# Patient Record
Sex: Female | Born: 1965 | Race: White | Hispanic: No | Marital: Married
Health system: Midwestern US, Academic
[De-identification: ages and names within clinical notes are randomized; demographics above are authoritative.]

---

## 2016-09-23 ENCOUNTER — Ambulatory Visit: Admit: 2016-09-23 | Discharge: 2016-09-23 | Payer: BC Managed Care – PPO

## 2016-09-23 ENCOUNTER — Encounter: Admit: 2016-09-23 | Discharge: 2016-09-23 | Payer: BC Managed Care – PPO

## 2016-09-23 DIAGNOSIS — J479 Bronchiectasis, uncomplicated: Principal | ICD-10-CM

## 2016-09-23 DIAGNOSIS — G43909 Migraine, unspecified, not intractable, without status migrainosus: ICD-10-CM

## 2016-09-23 DIAGNOSIS — I8290 Acute embolism and thrombosis of unspecified vein: ICD-10-CM

## 2016-09-23 DIAGNOSIS — G40909 Epilepsy, unspecified, not intractable, without status epilepticus: ICD-10-CM

## 2016-09-23 DIAGNOSIS — K219 Gastro-esophageal reflux disease without esophagitis: ICD-10-CM

## 2016-09-23 DIAGNOSIS — M797 Fibromyalgia: ICD-10-CM

## 2016-09-23 DIAGNOSIS — IMO0002 Ulcer: ICD-10-CM

## 2016-09-23 DIAGNOSIS — D4959 Neoplasm of unspecified behavior of other genitourinary organ: ICD-10-CM

## 2016-09-23 DIAGNOSIS — M359 Systemic involvement of connective tissue, unspecified: ICD-10-CM

## 2016-09-23 DIAGNOSIS — K7581 Nonalcoholic steatohepatitis (NASH): ICD-10-CM

## 2016-09-23 DIAGNOSIS — K5090 Crohn's disease, unspecified, without complications: Principal | ICD-10-CM

## 2016-09-23 DIAGNOSIS — J302 Other seasonal allergic rhinitis: ICD-10-CM

## 2016-09-23 DIAGNOSIS — K639 Disease of intestine, unspecified: ICD-10-CM

## 2016-09-23 DIAGNOSIS — I10 Essential (primary) hypertension: ICD-10-CM

## 2016-09-23 DIAGNOSIS — R05 Cough: Principal | ICD-10-CM

## 2016-09-23 DIAGNOSIS — D699 Hemorrhagic condition, unspecified: ICD-10-CM

## 2016-09-23 DIAGNOSIS — R42 Dizziness and giddiness: ICD-10-CM

## 2016-09-23 NOTE — Progress Notes
Subjective:       History of Present Illness  Tina Snyder is a 51 y.o. female.  She presents to my clinic today for follow-up of chronic bronchiectasis.  She states that she is in her usual state of health from pulmonary standpoint although her Crohn's disease has been more active in recent weeks.  She continues to take methotrexate weekly, and is also taking Entocort.  She occasionally has fevers and chills and says that she weakly has night sweats although she relates this to her Crohn's disease more than anything else.  She only has very mild cough which is very intermittent and usually in the morning.  Denies significant sputum production.  Denies weight loss, in fact she has had unintentional weight gain of approximately 20 pounds while on prednisone for various sinus complaints per her primary care physician over the past several months.  She is now off prednisone for the past 6 weeks and does feel that some of the weight gain and swelling is improving.  She denies chest pain or heart palpitations.       Review of Systems   Constitutional: Positive for fever.   Respiratory: Positive for cough and shortness of breath.    Neurological: Positive for dizziness.         Objective:         ??? blood sugar diagnostic (ONETOUCH VERIO) test strip Use 1 Strip as directed before meals and at bedtime.   ??? budesonide (ENTOCORT EC) 3 mg capsule Take 3 capsules by mouth every morning.   ??? cyclobenzaprine (FLEXERIL) 10 mg tablet Take 10 mg by mouth daily as needed. Marland Kitchen   ??? desvenlafaxine(+) (PRISTIQ) 50 mg tablet Take 50 mg by mouth every morning.     ??? diltiazem CD (CARDIZEM CD) 120 mg capsule Take 120 mg by mouth daily.   ??? diphenhydrAMINE (BENADRYL) 25 mg capsule Take 1 Cap by mouth every 4 hours as needed.   ??? folic acid (FOLVITE) 1 mg tablet Take 1 Tab by mouth daily.   ??? furosemide (LASIX PO) Take  by mouth.   ??? insulin aspart (niacinamide) (FIASP FLEXTOUCH U-100 INSULIN) 100 unit/mL (3 mL) inpn Inject 10 Units under the skin three times daily. + CF 1:20>140; take at first bite or up to 20 min after start of meal   ??? insulin aspart (NOVOLOG FLEXPEN) 100 unit/mL injection PEN Inject 0-7 Units under the skin three times daily with meals. Glucose 140-180 = 1 units   Glucose 181-220 = 2 units   Glucose 221-260 = 3 units  Glucose 261-300 = 4 units   Glucose 301-350 = 5 units   Glucose 351-400 = 6 units  Glucose >400 = 7 units   ??? INSULIN GLARGINE,HUM.REC.ANLOG (LANTUS SC) Inject  under the skin.   ??? insulin pen needles (disposable) (BD UF NANO PEN NEEDLES) 32 gauge x 5/32 pen needle Use 1 Each as directed as Needed. Use with insulin injections.   ??? lancets (ONE TOUCH DELICA) MISC Use 1 Each as directed before meals and at bedtime. Diag: Diabetes mellitus (E.11)   ??? meclizine (ANTIVERT) 25 mg tablet Take 1 tablet by mouth three times daily as needed.   ??? methotrexate 25 mg/mL injection 0.6 mL by SEE ADMIN INSTRUCTIONS route every 7 days. 25 mg IM weekly   ??? naproxen (NAPROSYN) 250 mg tablet Take 500 mg by mouth at bedtime as needed. Take with food.   ??? oxyCODONE (ROXICODONE, OXY-IR) 5 mg tablet Take 1 Tab  by mouth every 6 hours as needed for Pain   ??? pantoprazole DR (PROTONIX) 40 mg tablet Take 1 tablet by mouth twice daily.     Vitals:    09/23/16 1359   BP: (!) 156/99   Pulse: 114   Resp: 16   Temp: 36.9 ???C (98.5 ???F)   TempSrc: Oral   SpO2: 96%   Weight: 101.9 kg (224 lb 9.6 oz)   Height: 162.6 cm (64)     Body mass index is 38.55 kg/m???.     Physical Exam   Constitutional: She is oriented to person, place, and time. She appears well-developed and well-nourished.   HENT:   Head: Normocephalic and atraumatic.   Eyes: EOM are normal. Pupils are equal, round, and reactive to light.   Neck: Neck supple. No JVD present. No tracheal deviation present.   Cardiovascular: Normal rate and regular rhythm.    Pulmonary/Chest: Effort normal. No respiratory distress. She has no wheezes. She has no rales. She exhibits no tenderness.   Musculoskeletal: She exhibits no edema.   Neurological: She is alert and oriented to person, place, and time.   Skin: No rash noted.   Psychiatric: She has a normal mood and affect. Her behavior is normal.   Nursing note and vitals reviewed.           Assessment and Plan:  Impression:  1.  Cough.  This is most likely due to underlying chronic bronchiectasis and potentially to GE reflux to some degree.  Overall, I feel this is stable for the time being, and feel that her bronchiectasis is likely not acutely active.  2.  Bronchiectasis.  She has been taking Monday Wednesday Friday azithromycin, and is wondering about discontinuing this today.  It would appear that her bronchiectasis is under reasonable control at this time with minimal effort.  3.  GE reflux.  This is somewhat out of control currently with a recent change in her medications for Crohn's disease and and PPI.  4.  Crohn's disease, being followed by Dr. Janene Madeira.  Currently on methotrexate for systemic immunosuppression.  5.  Prior history of MAI infection of the lung.  This would appear to be quiescent for now.    Plan:  1.  Continue PPI per Dr. Janene Madeira.  2.  Discontinue azithromycin Monday Wednesday Friday as I feel that this is having limited effect for the time being and her bronchiectasis and MAI infection is quiescent.  3.  Continue Flonase 2 puffs each nostril daily.    I will plan to see her in follow-up in 6 months time with complete PFTs to be obtained at that office visit.  Overall I do feel that her lung disease is quiescent.

## 2016-09-24 ENCOUNTER — Encounter: Admit: 2016-09-24 | Discharge: 2016-09-24 | Payer: BC Managed Care – PPO

## 2016-09-24 ENCOUNTER — Ambulatory Visit: Admit: 2016-09-24 | Discharge: 2016-09-24 | Payer: BC Managed Care – PPO

## 2016-09-24 DIAGNOSIS — K639 Disease of intestine, unspecified: ICD-10-CM

## 2016-09-24 DIAGNOSIS — J302 Other seasonal allergic rhinitis: ICD-10-CM

## 2016-09-24 DIAGNOSIS — K219 Gastro-esophageal reflux disease without esophagitis: ICD-10-CM

## 2016-09-24 DIAGNOSIS — D4959 Neoplasm of unspecified behavior of other genitourinary organ: ICD-10-CM

## 2016-09-24 DIAGNOSIS — G43909 Migraine, unspecified, not intractable, without status migrainosus: ICD-10-CM

## 2016-09-24 DIAGNOSIS — M359 Systemic involvement of connective tissue, unspecified: ICD-10-CM

## 2016-09-24 DIAGNOSIS — K5090 Crohn's disease, unspecified, without complications: Principal | ICD-10-CM

## 2016-09-24 DIAGNOSIS — D699 Hemorrhagic condition, unspecified: ICD-10-CM

## 2016-09-24 DIAGNOSIS — K5080 Crohn's disease of both small and large intestine without complications: Principal | ICD-10-CM

## 2016-09-24 DIAGNOSIS — K7581 Nonalcoholic steatohepatitis (NASH): ICD-10-CM

## 2016-09-24 DIAGNOSIS — A310 Pulmonary mycobacterial infection: ICD-10-CM

## 2016-09-24 DIAGNOSIS — I10 Essential (primary) hypertension: ICD-10-CM

## 2016-09-24 DIAGNOSIS — G40909 Epilepsy, unspecified, not intractable, without status epilepticus: ICD-10-CM

## 2016-09-24 DIAGNOSIS — M797 Fibromyalgia: ICD-10-CM

## 2016-09-24 DIAGNOSIS — IMO0002 Ulcer: ICD-10-CM

## 2016-09-24 DIAGNOSIS — I8290 Acute embolism and thrombosis of unspecified vein: ICD-10-CM

## 2016-09-24 DIAGNOSIS — R42 Dizziness and giddiness: ICD-10-CM

## 2016-09-24 LAB — COMPREHENSIVE METABOLIC PANEL
Lab: 0.5 mg/dL — ABNORMAL HIGH (ref 0.3–1.2)
Lab: 1.2 mg/dL — ABNORMAL HIGH (ref 0.4–1.00)
Lab: 13 K/UL — ABNORMAL HIGH (ref 3–12)
Lab: 138 MMOL/L (ref 137–147)
Lab: 18 mg/dL (ref 7–25)
Lab: 247 mg/dL — ABNORMAL HIGH (ref 70–100)
Lab: 28 U/L (ref 7–40)
Lab: 29 MMOL/L (ref 21–30)
Lab: 3.1 MMOL/L — ABNORMAL LOW (ref 3.5–5.1)
Lab: 4.3 g/dL — ABNORMAL LOW (ref 3.5–5.0)
Lab: 45 mL/min — ABNORMAL LOW (ref >60)
Lab: 54 mL/min — ABNORMAL LOW (ref >60)
Lab: 66 U/L — ABNORMAL HIGH (ref 7–56)
Lab: 7.2 g/dL — ABNORMAL LOW (ref >60)
Lab: 83 U/L — ABNORMAL LOW (ref 25–110)
Lab: 9.8 mg/dL — ABNORMAL HIGH (ref >60)

## 2016-09-24 LAB — CBC AND DIFF
Lab: 0 10*3/uL (ref 0–0.20)
Lab: 11 10*3/uL — ABNORMAL HIGH (ref 4.5–11.0)
Lab: 4 M/UL (ref 4.0–5.0)

## 2016-09-24 LAB — SED RATE: Lab: 10 mm/h — ABNORMAL LOW (ref 0–30)

## 2016-09-24 LAB — C REACTIVE PROTEIN (CRP): Lab: 1.3 mg/dL — ABNORMAL HIGH (ref <1.0)

## 2016-09-24 MED ORDER — OMEPRAZOLE 40 MG PO CPDR
40 mg | ORAL_CAPSULE | Freq: Every day | ORAL | 11 refills | Status: AC
Start: 2016-09-24 — End: 2018-07-28

## 2016-09-24 MED ORDER — DICYCLOMINE 10 MG PO CAP
10 mg | ORAL_CAPSULE | Freq: Four times a day (QID) | ORAL | 5 refills | Status: AC | PRN
Start: 2016-09-24 — End: 2017-03-31

## 2016-09-24 NOTE — Progress Notes
Date of Service: 09/24/2016    Subjective:             Tina Snyder is a 51 y.o. female.    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  Since her last visit we started her on budesonide 9 mg per day.  This may have helped somewhat but only minimally.  She continues on methotrexate 25 mg per week.  She saw ENT about her dizziness and vertigo and the current opinion seems to be that this might be migraine related.  The patient was not able to have the MR enterography performed due to scheduling issues.  She feels like Protonix does not work as well as Nexium for reflux.  She was on prednisone a few times for sinus infections and did not feel like her Crohn's symptoms improved while on prednisone.  She has taken antibiotics recently and these actually worsen her symptoms.  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  She follows with infectious diseases and pulmonary for this.  She is not a candidate for biologic therapy due to this infection.  She was on Humira for a period of time before the Remicade but it did not help.  Her weight has increased with recent steroid use.  Last EGD and colonoscopy were in 2016 did not show active Crohn's disease.  She does have some crampy abdominal pain and loose stool.       Review of Systems   Constitutional: Positive for activity change, appetite change, fatigue and unexpected weight change.   HENT: Positive for congestion, facial swelling and sinus pressure.    Eyes: Positive for photophobia.   Respiratory: Positive for chest tightness and shortness of breath.    Cardiovascular: Positive for chest pain.   Gastrointestinal: Positive for abdominal pain, constipation, diarrhea, nausea and rectal pain.   Genitourinary: Positive for frequency.   Musculoskeletal: Positive for arthralgias, back pain, myalgias and neck pain. Skin: Positive for color change.   Neurological: Positive for dizziness, light-headedness and headaches.   Psychiatric/Behavioral: Positive for dysphoric mood and sleep disturbance. The patient is nervous/anxious.    All other systems reviewed and are negative.    Objective:         ??? blood sugar diagnostic (ONETOUCH VERIO) test strip Use 1 Strip as directed before meals and at bedtime.   ??? budesonide (ENTOCORT EC) 3 mg capsule Take 3 capsules by mouth every morning.   ??? cyclobenzaprine (FLEXERIL) 10 mg tablet Take 10 mg by mouth daily as needed. Marland Kitchen   ??? desvenlafaxine(+) (PRISTIQ) 50 mg tablet Take 50 mg by mouth every morning.     ??? diltiazem CD (CARDIZEM CD) 120 mg capsule Take 120 mg by mouth daily.   ??? diphenhydrAMINE (BENADRYL) 25 mg capsule Take 1 Cap by mouth every 4 hours as needed.   ??? folic acid (FOLVITE) 1 mg tablet Take 1 Tab by mouth daily.   ??? furosemide (LASIX PO) Take  by mouth.   ??? insulin aspart (niacinamide) (FIASP FLEXTOUCH U-100 INSULIN) 100 unit/mL (3 mL) inpn Inject 10 Units under the skin three times daily. + CF 1:20>140; take at first bite or up to 20 min after start of meal   ??? insulin aspart (NOVOLOG FLEXPEN) 100 unit/mL injection PEN Inject 0-7 Units under the skin three times daily with meals. Glucose 140-180 = 1 units  Glucose 181-220 = 2 units   Glucose 221-260 = 3 units  Glucose 261-300 = 4 units   Glucose 301-350 = 5 units   Glucose 351-400 = 6 units  Glucose >400 = 7 units   ??? INSULIN GLARGINE,HUM.REC.ANLOG (LANTUS SC) Inject  under the skin.   ??? insulin pen needles (disposable) (BD UF NANO PEN NEEDLES) 32 gauge x 5/32 pen needle Use 1 Each as directed as Needed. Use with insulin injections.   ??? lancets (ONE TOUCH DELICA) MISC Use 1 Each as directed before meals and at bedtime. Diag: Diabetes mellitus (E.11)   ??? meclizine (ANTIVERT) 25 mg tablet Take 1 tablet by mouth three times daily as needed.   ??? methotrexate 25 mg/mL injection 0.6 mL by SEE ADMIN INSTRUCTIONS route every 7 days. 25 mg IM weekly   ??? naproxen (NAPROSYN) 250 mg tablet Take 500 mg by mouth at bedtime as needed. Take with food.   ??? oxyCODONE (ROXICODONE, OXY-IR) 5 mg tablet Take 1 Tab by mouth every 6 hours as needed for Pain   ??? pantoprazole DR (PROTONIX) 40 mg tablet Take 1 tablet by mouth twice daily.     Vitals:    09/24/16 1237   BP: (!) 176/102   Pulse: 116   Temp: 36.2 ???C (97.2 ???F)   TempSrc: Oral   Weight: 100.3 kg (221 lb 3.2 oz)   Height: 162.6 cm (64)     Body mass index is 37.97 kg/m???.     Physical Exam  Vital signs and nurses notes reviewed  Patient alert and oriented, appropriate  Lungs clear to auscultation  Heart regular rhythm and rate  Abdomen soft, non-tender, normal bowel sounds  Neuro exam without focal deficits       Assessment and Plan:           1. Crohn's disease of small bowel and colon s/p resections  2. Chronic MAI infection  3. GERD    Tina Snyder seems to be symptomatic with some crampy abdominal pain and diarrhea.  Not sure whether this is related to active Crohn's or postoperative state or irritable bowel syndrome.  We will reschedule the MR enterography to assess for any bowel wall thickening or inflammation.  I would also like to recheck labs today.  She will continue budesonide and methotrexate for now.  I have added dicyclomine as needed to decrease bowel spasm.  I will try changing her Protonix to Prilosec 40 mg daily to see if this works better for heartburn and dyspepsia.  We will have Tina Snyder return to GI clinic for follow-up.    Tina Snyder was seen today for crohn's disease.    Diagnoses and all orders for this visit:    Crohn's disease of both small and large intestine without complication   -     dicyclomine (BENTYL) 10 mg capsule; Take 1 capsule by mouth four times daily as needed.  -     C REACTIVE PROTEIN (CRP); Future; Expected date: 09/24/2016  -     CBC AND DIFF; Future; Expected date: 09/24/2016 -     COMPREHENSIVE METABOLIC PANEL; Future; Expected date: 09/24/2016  -     SED RATE; Future; Expected date: 09/24/2016  -     MRI ENTEROGRAPHY; Future; Expected date: 09/24/2016    Gastroesophageal reflux disease without esophagitis  -     omeprazole DR(+) (PRILOSEC) 40 mg capsule; Take 1 capsule by mouth daily before breakfast.    Patient Instructions   1.  Stop Protonix  2. Try Prilosec 40 mg po daily  3. Reschedule MR enterography  4. Labs today  5. Continue methotrexate and budesonide for now  6. Dicyclomine 10 mg four times per day as needed for Crohn's disease

## 2016-09-25 ENCOUNTER — Encounter: Admit: 2016-09-25 | Discharge: 2016-09-25 | Payer: BC Managed Care – PPO

## 2016-09-25 NOTE — Telephone Encounter
Attempt to contact patient regarding results along with Dr. York Cerise recommendations.  Left message for patient to check MyChart portal regarding results letter to patient.  Patient to call us back for any questions.

## 2016-09-25 NOTE — Telephone Encounter
-----   Message from Viviann Spare, MD sent at 09/24/2016  4:36 PM CDT -----  Labs suggest some inflammation  Glucose and wbc could be up due to recent steroid use  Proceed with MR enterography as ordered

## 2016-09-25 NOTE — Telephone Encounter
Called notified pt of coverage for omeprazole. Pt verbalized understanding.

## 2016-10-21 ENCOUNTER — Encounter: Admit: 2016-10-21 | Discharge: 2016-10-21 | Payer: BC Managed Care – PPO

## 2016-10-21 DIAGNOSIS — K50018 Crohn's disease of small intestine with other complication: Principal | ICD-10-CM

## 2016-10-21 MED ORDER — BUDESONIDE 3 MG PO CECX
9 mg | ORAL_CAPSULE | Freq: Every morning | ORAL | 1 refills | 30.00000 days | Status: AC
Start: 2016-10-21 — End: 2017-03-27

## 2016-10-25 ENCOUNTER — Ambulatory Visit: Admit: 2016-10-25 | Discharge: 2016-10-25 | Payer: BC Managed Care – PPO

## 2016-10-25 DIAGNOSIS — K5080 Crohn's disease of both small and large intestine without complications: Principal | ICD-10-CM

## 2016-10-25 LAB — POC CREATININE, RAD: Lab: 1 mg/dL (ref 0.4–1.00)

## 2016-10-25 LAB — POC GLUCOSE: Lab: 110 mg/dL — ABNORMAL HIGH (ref 70–100)

## 2016-10-25 MED ORDER — GADOBENATE DIMEGLUMINE 529 MG/ML (0.1MMOL/0.2ML) IV SOLN
20 mL | Freq: Once | INTRAVENOUS | 0 refills | Status: CP
Start: 2016-10-25 — End: ?
  Administered 2016-10-25: 21:00:00 20 mL via INTRAVENOUS

## 2016-10-25 MED ORDER — GLUCAGON HCL 1 MG IJ SOLR
1 [IU] | Freq: Once | SUBCUTANEOUS | 0 refills | Status: CP
Start: 2016-10-25 — End: ?
  Administered 2016-10-25: 21:00:00 1 mg via SUBCUTANEOUS

## 2016-10-25 MED ORDER — SODIUM CHLORIDE 0.9 % IJ SOLN
50 mL | Freq: Once | INTRAVENOUS | 0 refills | Status: CP
Start: 2016-10-25 — End: ?
  Administered 2016-10-25: 21:00:00 50 mL via INTRAVENOUS

## 2016-10-25 MED ORDER — BREEZA FOR NEUTRAL ABDOMINAL/PELVIC IMAGING PO SOLN
1500 mL | Freq: Once | ORAL | 0 refills | Status: CP
Start: 2016-10-25 — End: ?

## 2016-10-25 NOTE — Progress Notes
Pt present for MR Enterography. Tech instructed pt on procedure and oral contrast administration, Breeza. RN spoke pt prior to glucagon injection. Denies contraindications- steroid use, pheochromocytoma. Pt diabetic. Orders placed per protocol. Documented on MAR.     1430:  BS 110

## 2017-02-21 ENCOUNTER — Ambulatory Visit: Admit: 2017-02-21 | Discharge: 2017-02-21 | Payer: BC Managed Care – PPO

## 2017-02-21 ENCOUNTER — Encounter: Admit: 2017-02-21 | Discharge: 2017-02-21 | Payer: BC Managed Care – PPO

## 2017-02-21 DIAGNOSIS — M359 Systemic involvement of connective tissue, unspecified: ICD-10-CM

## 2017-02-21 DIAGNOSIS — D4959 Neoplasm of unspecified behavior of other genitourinary organ: ICD-10-CM

## 2017-02-21 DIAGNOSIS — IMO0002 Ulcer: ICD-10-CM

## 2017-02-21 DIAGNOSIS — G40909 Epilepsy, unspecified, not intractable, without status epilepticus: ICD-10-CM

## 2017-02-21 DIAGNOSIS — K219 Gastro-esophageal reflux disease without esophagitis: ICD-10-CM

## 2017-02-21 DIAGNOSIS — E1165 Type 2 diabetes mellitus with hyperglycemia: Principal | ICD-10-CM

## 2017-02-21 DIAGNOSIS — E119 Type 2 diabetes mellitus without complications: Principal | ICD-10-CM

## 2017-02-21 DIAGNOSIS — M797 Fibromyalgia: ICD-10-CM

## 2017-02-21 DIAGNOSIS — I10 Essential (primary) hypertension: ICD-10-CM

## 2017-02-21 DIAGNOSIS — G43909 Migraine, unspecified, not intractable, without status migrainosus: ICD-10-CM

## 2017-02-21 DIAGNOSIS — I8290 Acute embolism and thrombosis of unspecified vein: ICD-10-CM

## 2017-02-21 DIAGNOSIS — R42 Dizziness and giddiness: ICD-10-CM

## 2017-02-21 DIAGNOSIS — D699 Hemorrhagic condition, unspecified: ICD-10-CM

## 2017-02-21 DIAGNOSIS — K639 Disease of intestine, unspecified: ICD-10-CM

## 2017-02-21 DIAGNOSIS — K5090 Crohn's disease, unspecified, without complications: Principal | ICD-10-CM

## 2017-02-21 DIAGNOSIS — J302 Other seasonal allergic rhinitis: ICD-10-CM

## 2017-02-21 DIAGNOSIS — K7581 Nonalcoholic steatohepatitis (NASH): ICD-10-CM

## 2017-02-21 MED ORDER — PEN NEEDLE, DIABETIC 32 GAUGE X 5/32" MISC NDLE
1 | Freq: Before meals | 11 refills | 30.00000 days | Status: AC
Start: 2017-02-21 — End: 2018-05-08

## 2017-02-21 MED ORDER — INSULIN ASPART 100 UNIT/ML SC FLEXPEN
11 refills | 42.00000 days | Status: AC
Start: 2017-02-21 — End: 2018-03-18

## 2017-02-21 MED ORDER — FLASH GLUCOSE SENSOR MISC KIT
1 | PACK | 11 refills | Status: SS
Start: 2017-02-21 — End: 2017-06-29

## 2017-02-21 MED ORDER — FLASH GLUCOSE SCANNING READER MISC MISC
1 | Freq: Every day | 1 refills | Status: SS
Start: 2017-02-21 — End: 2017-06-29

## 2017-02-21 MED ORDER — INSULIN GLARGINE 100 UNIT/ML (3 ML) SC INJ PEN
11 refills | Status: SS
Start: 2017-02-21 — End: 2017-06-29

## 2017-02-21 NOTE — Progress Notes
Subjective     02/21/2017     Tina Snyder 52 y.o. female here to follow up for T2DM at [facility]   07/19/2016 .last seen with Arvilla Market, APRN, CDE     Type 2 Diabetes mellitus  Dx: 2012 prednisone-induced Diabetes first, then T2DM Sum 2017 began insulin  Current treatment: Multiple Daily Injections (MDI) (see chart): Lantus,79u/d Humalog/Novolog/Fiasp with meals plus Correction Factor CF 2:40 >140   Past treatment: glyburide 2012 when on prednisone   Number of glucose checks per day: 4  Hyperglycemia: yes   Hypoglycemia: mild, rare   Meals per day: 2-3   CHO intake: <60gms/meal goal   Weight: 218#   Exercise:  as tolerated   Shortness of breath or Chest pain with exertion: yes    DM related hospitalizations: no     Complications of DM:  CAD: No  CVA: No  PVD: No  Amputations: No  Retinopathy: No  Gastropathy: No  Nephropathy: CKD eGFR 47, stage 3  Neuropathy: No  Depression: No  Chronic wounds / delayed healing: no     DM related medications:  Statin: no  ACE-I: Yes  ASA: no    DM-related routine health maintenance:   ??? Last dental exam in last 6 mo: UTD   ??? Last eye exam in last 12 mo: UTD   ??? Last lipid panel: 2017  ??? Last urine microalbumin: Prot/Cr 7.2017 0.2   ??? Last foot exam: 2017   ??? Last meeting with diabetic educator: none   ??? Last meeting with nutritionist:     Past Medical History; Past Surgical History; Family History; Social History and problem list reviewed; changes updated per patient report, paperwork completed     Past Medical History:   Diagnosis Date   ??? Autoimmune disease (HCC)     Chron's   ??? Bleeding tendency (HCC)    ??? Blood clot in vein     subclavian   ??? Bowel disease    ??? Crohn disease (HCC)    ??? Dizziness    ??? Fibromyalgia    ??? GERD (gastroesophageal reflux disease)    ??? Hypertension    ??? Hypertensive heart disease    ??? Migraines    ??? Seasonal allergic reaction    ??? Seizure disorder (HCC)    ??? Steatohepatitis     felt secondary to medications   ??? Ulcer ??? Vaginal tumor        Past Surgical History:   Procedure Laterality Date   ??? HX ADENOIDECTOMY  05/1995   ??? LUNG SURGERY  10/22/2010    lung biopsy   ??? LIVER BIOPSY  12/2010   ??? PR COLONOSCOPY FLX DX W/COLLJ SPEC WHEN PFRMD N/A 03/13/2015    COLONOSCOPY performed by Elyn Aquas, MD at [facility]   ??? UPPER GASTROINTESTINAL ENDOSCOPY N/A 03/13/2015    ESOPHAGOGASTRODUODENOSCOPY performed by Elyn Aquas, MD at [facility]   ??? UPPER GASTROINTESTINAL ENDOSCOPY  03/13/2015    ESOPHAGOGASTRODUODENOSCOPY BIOPSY performed by Elyn Aquas, MD at [facility]   ??? COLONOSCOPY  03/13/2015    COLONOSCOPY BIOPSY performed by Elyn Aquas, MD at [facility]   ??? APPENDECTOMY     ??? BRONCHOSCOPY     ??? CHOLECYSTECTOMY     ??? GASTRIC FUNDOPLICATION     ??? HEMORRHOIDECTOMY     ??? HX BREAST LUMPECTOMY     ??? HX ENDOSCOPY      colonoscopy 2014   ??? HX HYSTERECTOMY      partial  02/1995, ovaries removed 1999   ??? OOPHORECTOMY      1999 ovaries removed   ??? SMALL BOWEL RESECTION     ??? SUBTOTAL COLECTOMY      Ileocolectomy        Family History   Problem Relation Age of Onset   ??? Celiac Disease Son    ??? Asthma Son    ??? Hypertension Mother    ??? Depression Mother    ??? Basal Cell Carcinoma Mother    ??? Hypertension Father    ??? High Cholesterol Father    ??? Depression Father    ??? Hypertension Maternal Grandmother    ??? Stroke Maternal Grandmother    ??? Heart Failure Maternal Grandfather    ??? Stroke Maternal Grandfather    ??? Migraines Maternal Grandfather    ??? Cancer Paternal Grandmother         breast   ??? Hypertension Paternal Grandmother    ??? High Cholesterol Paternal Grandmother    ??? Depression Paternal Grandmother    ??? Heart Failure Paternal Grandfather    ??? Hypertension Paternal Grandfather    ??? Blood Clots Neg Hx    ??? COPD Neg Hx    ??? Coronary Artery Disease Neg Hx    ??? Cystic Fibrosis Neg Hx    ??? DVT Neg Hx    ??? Pulmonary Embolism Neg Hx    ??? Pulmonary Fibrosis Neg Hx    ??? Pulmonary HTN Neg Hx    ??? Melanoma Neg Hx        Social History     Social History ??? Marital status: Married     Spouse name: Matt   ??? Number of children: 2   ??? Years of education: N/A     Occupational History   ??? 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 Smoking status: Never Smoker   ??? Smokeless tobacco: Never Used   ??? Alcohol use No      Comment: rare   ??? Drug use: No   ??? Sexual activity: Not on file     Other Topics Concern   ??? Not on file     Social History Narrative   ??? No narrative on file       Patient Active Problem List    Diagnosis Date Noted   ??? Enteritis 03/27/2016   ??? Parotitis 10/18/2015   ??? CKD (chronic kidney disease) stage 3, GFR 30-59 ml/min (HCC) 10/18/2015   ??? Cough 02/22/2014   ??? LFT elevation 11/09/2012   ??? 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 Surgical wound infection 09/30/2012   ??? 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 DVT (deep venous thrombosis) (HCC) 10/31/2010   ??? Anemia 10/30/2010   ??? Supratherapeutic INR 10/30/2010   ??? Seizure disorder (HCC) 10/30/2010   ??? Hypoxia 10/16/2010   ??? Abnormal LFTs 10/16/2010   ??? HTN (hypertension) 10/16/2010   ??? Depression 10/16/2010   ??? Crohn's disease (HCC) 09/13/2007       Review of Systems -  Please refer to HPI and/or problem list for positives, otherwise neg on a 10+ ROS       Objective    ??? blood sugar diagnostic (ONETOUCH VERIO) test strip Use 1 Strip as directed before meals and at bedtime.   ??? budesonide (ENTOCORT EC) 3 mg capsule TAKE 3 CAPSULES BY MOUTH EVERY MORNING.   ??? cyclobenzaprine (  FLEXERIL) 10 mg tablet Take 10 mg by mouth daily as needed. Marland Kitchen   ??? desvenlafaxine(+) (PRISTIQ) 50 mg tablet Take 50 mg by mouth every morning.     ??? dicyclomine (BENTYL) 10 mg capsule Take 1 capsule by mouth four times daily as needed.   ??? diltiazem CD (CARDIZEM CD) 120 mg capsule Take 120 mg by mouth daily.   ??? diphenhydrAMINE (BENADRYL) 25 mg capsule Take 1 Cap by mouth every 4 hours as needed. ??? flash glucose scanning reader misc Use 1 each as directed ten times daily. May check continuously ad lib   ??? flash glucose sensor kit Use 1 each as directed as directed. Change every 10 days   ??? folic acid (FOLVITE) 1 mg tablet Take 1 Tab by mouth daily.   ??? furosemide (LASIX PO) Take  by mouth.   ??? insulin aspart (niacinamide) (FIASP FLEXTOUCH U-100 INSULIN) 100 unit/mL (3 mL) inpn Inject 10 Units under the skin three times daily. + CF 1:20>140; take at first bite or up to 20 min after start of meal   ??? insulin aspart U-100 (NOVOLOG FLEXPEN U-100 INSULIN) 100 unit/mL injection PEN 15/15/20 (or Carb ratio 1u:3gm) at B/L/D + CF 1:10>140, Max: 200u/d   ??? insulin glargine (LANTUS SOLOSTAR, BASAGLAR) 100 unit/mL (3 mL) injection PEN adjusting to reach am fasting target 80-130. May use iSage app   ??? insulin pen needles (disposable) (BD UF NANO PEN NEEDLES) 32 gauge x 5/32 pen needle Use one each as directed before meals and at bedtime. Use with insulin injections. 5/d. Remove pen needle after each injection.   ??? lancets (ONE TOUCH DELICA) MISC Use 1 Each as directed before meals and at bedtime. Diag: Diabetes mellitus (E.11)   ??? meclizine (ANTIVERT) 25 mg tablet Take 1 tablet by mouth three times daily as needed.   ??? methotrexate 25 mg/mL injection 0.6 mL by SEE ADMIN INSTRUCTIONS route every 7 days. 25 mg IM weekly   ??? naproxen (NAPROSYN) 250 mg tablet Take 500 mg by mouth at bedtime as needed. Take with food.   ??? omeprazole DR(+) (PRILOSEC) 40 mg capsule Take 1 capsule by mouth daily before breakfast.   ??? oxyCODONE (ROXICODONE, OXY-IR) 5 mg tablet Take 1 Tab by mouth every 6 hours as needed for Pain     New scripts for Lantus, Novolog, Freestyle Libre personal glucometer or Flash    Vitals:    02/21/17 1549   Weight: 99.8 kg (220 lb)   Height: 162.6 cm (64)       Physical Exam    Lab Results   Component Value Date    TRIG 148 09/03/2012       TSH   Date/Time Value Ref Range Status 03/26/2016 04:38 PM 2.932 0.35 - 5.00 MCU/ML Final     T4-Free   Date/Time Value Ref Range Status   09/25/2007 12:30 PM 0.7 0.6 - 1.6 NG/DL Final     Comment:     NOTE NEW REFERENCE RANGES       Lab Results   Component Value Date/Time    NA 138 09/24/2016 01:58 PM    K 3.1 (L) 09/24/2016 01:58 PM    CL 96 (L) 09/24/2016 01:58 PM    CO2 29 09/24/2016 01:58 PM    GAP 13 (H) 09/24/2016 01:58 PM    BUN 18 09/24/2016 01:58 PM    CR 1.0 10/25/2016 02:30 PM    CR 1.26 (H) 09/24/2016 01:58 PM    GLU 247 (H)  09/24/2016 01:58 PM       Lab Results   Component Value Date/Time    CA 9.8 09/24/2016 01:58 PM    PO4 5.2 (H) 09/06/2012 04:17 AM    ALBUMIN 4.3 09/24/2016 01:58 PM    TOTPROT 7.2 09/24/2016 01:58 PM    ALKPHOS 83 09/24/2016 01:58 PM    AST 28 09/24/2016 01:58 PM    ALT 66 (H) 09/24/2016 01:58 PM    TOTBILI 0.5 09/24/2016 01:58 PM    GFR 45 (L) 09/24/2016 01:58 PM    GFRAA 54 (L) 09/24/2016 01:58 PM       Reviewed with French Ana who is checking at 3.5 different times of the day, with a total of   110 tests over the last 30 days. The mean of all blood glucose values during that time was 195 +/-82. The lowest value was 60, the highest value was 401. Based on the time of day, Breakfast mean was 105-133; Lunch:114-145; Dinner: 239-261; HS 201-267.  A1c avg  6.8%-168     Random BG: 237  Hgb A1c: 6.8%    Assessment and Plan:    Diabetes mellitus type 2, controlled   ??? A1c 6.8% in clinic today   ??? Target A1c <7%   ??? Currently on Lantus, Novolog 10u/meal + CF see below    ??? Complicated by: Nephropathy, Retinopathy, Peripheral neuropathy, Autonomic neuropathy, Gastropathy/Delayed gastric emptying, CAD, CVA, PVD, amputations    Plan:  ??? Continue current regimen see below     Diabetic complication assessment:   ??? Annual Dilated eye exam: last done 2018, due yearly   ??? Annual labs (electrolytes and renal function): Reviewed, last done 9.2018  ??? Annual Urine microalbumin/Cr: last done unavailable, result f   ??? ACE-I or ARB: no ??? Foot exam / monofilament exam (recommended annually): 2018    Supportive care for diabetes:  ??? Diabetic Educator (recommended annually): Dustin Flock, BSN,RN,CDE 11.2.18  ??? Nutritionist: Dustin Flock, BSN,RN,CDE 11.2.18    Lipids: Hyperlipidemia    ??? Had lipid profile in 5.15.14 TG 148  ??? Currently on a statin: no    Blood pressure: Hypertension   ??? Controlled <130/80-goal   ??? On     Tina Snyder was seen today for diabetes.    Diagnoses and all orders for this visit:    Type 2 diabetes mellitus with hyperglycemia, with long-term current use of insulin (HCC)  -     POC GLUCOSE QUANTITATIVE BLOOD  -     POC HEMOGLOBIN A1C  -     GLUCOSE METER DOWNLOAD    Other orders  -     flash glucose scanning reader misc; Use 1 each as directed ten times daily. May check continuously ad lib  -     flash glucose sensor kit; Use 1 each as directed as directed. Change every 10 days  -     insulin glargine (LANTUS SOLOSTAR, BASAGLAR) 100 unit/mL (3 mL) injection PEN; adjusting to reach am fasting target 80-130. May use iSage app  -     insulin aspart U-100 (NOVOLOG FLEXPEN U-100 INSULIN) 100 unit/mL injection PEN; 15/15/20 (or Carb ratio 1u:3gm) at B/L/D + CF 1:10>140, Max: 200u/d  -     insulin pen needles (disposable) (BD UF NANO PEN NEEDLES) 32 gauge x 5/32 pen needle; Use one each as directed before meals and at bedtime. Use with insulin injections. 5/d. Remove pen needle after each injection.      Patient Instructions  Diabetes Action Plan: for Tina Snyder  Your Diabetic Control:  Today's HgbA1c  6.8% ,  Last visit value  6.8%.  Your A1c goal is <7% reflecting average blood sugars of 150 mg/dl or lower.    Your premeal and bedtime blood sugar targets should be 90-130.    Actions taken today:  Med/Insulin adjustment; review of diabetes treatment plan: lifestyle, meds, nutrition.   Pills and new medications:  No orders of the defined types were placed in this encounter. Daily Insulin Doses  Breakfast Lunch Dinner/evening  evening/overnight   Rapid Acting type Novolog/Fiasp (sample pen given to try) 15 + CF 1:10>140 15 + CF 1:10>140 20 + CF 1:10>140     Basal type Lantus    144 (was 146)   Clock time           6 7 8 9 10 11 12 13 14 15 16 17  18 19 20 21 22 23       MN 01 02 03 04 05    Correction Factor   [Measured Blood Sugar minus 140] divided by 10 = number of EXTRA units needed to correct a high premeal sugar.  4:40 >140 Add additional Novolog/Humalog (see chart) u each meal, to this scheduled dose based on the premeal blood sugar.   Up to 140 0  141-180 add 4 units   181-220 add 8 units   221-260 add 12 units   261-320 add 16 units   321-380 add 20 units   380-420 add 24 units  421-460 add 28 units  461-520 add 32 units  521-560 add 36 units  561-620 add 40 units    iSage app nonstudy.     New scripts for Lantus, Novolog, Freestyle [employer] or Flash    Please check with your other providers re: restarting Victoza.   So good to see you again.  Thank you for allowing me to cheer you on in your 24-7 diabetes care. Keep up the great work!   Hope your time until we meet again is healthy and meaningful.     Future Appointments  Date Time Provider Department Center   03/12/2017 9:00 AM PF LAB SCHEDULE A [id number] None   03/28/2017 3:30 PM Dustin Flock, RN [facility]   06/11/2017 8:30 AM Audie Box, MD [facility]   06/20/2017 3:00 PM Colin Broach, APRN [facility]       Total time 1545-1615 minutes with over 50% of the time counseling on treatment and plan for the diagnosis. Arvilla Market, APRN, PPCNP, BC, FNP, [facility], CDE  Collaborating Physician: Dineen Kid. Sherral Hammers, MD

## 2017-03-07 ENCOUNTER — Encounter: Admit: 2017-03-07 | Discharge: 2017-03-07 | Payer: BC Managed Care – PPO

## 2017-03-21 NOTE — Progress Notes
Diabetes Self-Management Education    Assessment  Reason for consult: DSME  Diabetes Management Advanced Practice Provider: Arvilla Market, APRN  Referring Provider: Arvilla Market, APRN         Diabetes Hx  Type of Diabetes: Type 2 on Insulin, Steroid Induced       Medication Comment: Lantus 146 units once daily in evening. Novolog 10-30 units at a meal. CF 1:40 above 140mg /dL.    Source: Per Patient Verbal Report   Range per Meter: FBG 100s, AC Lunch 150s, AC dinner 200s, HS 150-200s, occasionally in 300s in evenings     Hypoglycemia  Feels low in the 80s. She treats hypoglycemia with juice.         Yesterday's Diet recall:     Breakfast AM Snacks Lunch PM Snacks Dinner Evening   Oatmeal with raisins  None  Steak with salad Chocolate (left over Halloween candy)      Diet Comments: 2-3 meals daily with occasional snack       Type, Frequency, Duration of Exercise: No activity     Specific Areas of Concern Today: She reports her biggest challenge is food.  Reviewed diabetes self-care behaviors. She would like to discuss sick day guidelines.     Education Concerns Identified: 51 y.o. female with T2DM. HgbA1c 6.8% on 07/19/16. She has appt with Arvilla Market, APRN today at 3:30PM.     Intervention  Learners Present: Patient   Education Type: 1 on 1 Outpatient DSME  Handouts Given: [facility] Diabetes Education Notebook, Clinical biochemist options     Education provided:  Disease Process: Pathophysiology, Types of Diabetes, Treatment Options, Natural Course of Diabetes (1)  Carbohydrate Counting: Avoiding sugary drinks, Main sources of carbohydrate, How nutrients affect BG, Sweeteners, Timing of meals (1)   Healthy Eating: Healthy fats/oils, Sources of fiber, Increase fruits/vegetables, Healthy snacking, Healthy Meals, Including protein, Plate model (1)   Physical Activity: Effect on BG, Health benefits (1)   Medications:  (2)   Blood Glucose Monitoring: Personal targets (3) Acute Complications: Hyperglycemia: Causes and prevention, Correction via injection, Drink Fluids, Exercise (2)   Acute Complications: Hypoglycemia: Causes and prevention, Fast-acting carbohydrates, Follow-up treatment, Rule of 15, Signs and symptoms (2)   Sick Day Management: Effect of illness on BG, Adjustment in eating/drinking, Adjustment of medication, Fluid intake sugar-free vs sugary, When to contact HCP (1)   Insulin Pumps and Continuous Glucose Monitoring:  (NC)   Chronic Complications:  (2)   Psychosocial Adjustment: Goal setting (2)       Developing strategies to promote health & behavior change (2)    Pre-session assessment shown in parentheses following content area assessed  1=needs improvement   2=needs review   3=comprehends key points   4=demonstrates understanding   NC=not covered   N/A=not applicable      Diabetes Self-Management Support Plan: Return for f/u with educator     Diabetes Program Outcome Monitoring and Evaluation: A1C       Program Outcome at Baseline: 6.8      Patient-Selected Behavior Change Goal Baseline 25%        Patient-Selected Behavior Change Objective Area: Nutritional Management/Healthy Eating   Patient-Selected Behavior Change Goal: Use Plate Model to help balance evening meal.      Diabetes Education Plan:   Eat 3 small-medium sized meals each day with 1-2 healthy snacks as needed.   Follow a carbohydrate consistent diet. Pair carbohydrates with a protein or healthy fat. Choose carbohydrates that contain fiber like a serving  of fruit or whole grains.   Aim to have non-starchy vegetables at each meal and with snacks.   Avoid sugary beverages. Limit artificial sweeteners. Drink unsweetened beverages and plenty of water daily.      Consider ways to increase physical activity daily. Start with small doable steps and work up as you can tolerate. It is recommended to do 150 minutes of moderate physical activity weekly. Try to do your activity in the afternoon or evening to help lower blood sugar.     Reference Sick Day Guidelines during times of illness.         Start Time: 1430  Stop Time: 1530    Monitoring and Evaluation    Follow-up: 4 week follow-up with RN, CDE

## 2017-03-27 ENCOUNTER — Encounter: Admit: 2017-03-27 | Discharge: 2017-03-27 | Payer: BC Managed Care – PPO

## 2017-03-27 DIAGNOSIS — K50018 Crohn's disease of small intestine with other complication: Principal | ICD-10-CM

## 2017-03-27 MED ORDER — BUDESONIDE 3 MG PO CECX
ORAL_CAPSULE | ORAL | 1 refills | 30.00000 days | Status: AC
Start: 2017-03-27 — End: 2017-09-22

## 2017-03-28 NOTE — Progress Notes
Spoke with Dr. Charlie Pitter regarding Tina Snyder. Dr. Charlie Pitter would like Ms. Aust to come to be seen on Monday. Ms. Ehinger agreeable and would like an appointment at 10:30. Routing to front office to schedule.     Ramonita Lab, RN

## 2017-03-31 ENCOUNTER — Ambulatory Visit: Admit: 2017-03-31 | Discharge: 2017-03-31 | Payer: BC Managed Care – PPO

## 2017-03-31 ENCOUNTER — Encounter: Admit: 2017-03-31 | Discharge: 2017-03-31 | Payer: BC Managed Care – PPO

## 2017-03-31 DIAGNOSIS — R079 Chest pain, unspecified: Principal | ICD-10-CM

## 2017-03-31 DIAGNOSIS — I8290 Acute embolism and thrombosis of unspecified vein: ICD-10-CM

## 2017-03-31 DIAGNOSIS — K7581 Nonalcoholic steatohepatitis (NASH): ICD-10-CM

## 2017-03-31 DIAGNOSIS — D4959 Neoplasm of unspecified behavior of other genitourinary organ: ICD-10-CM

## 2017-03-31 DIAGNOSIS — D699 Hemorrhagic condition, unspecified: ICD-10-CM

## 2017-03-31 DIAGNOSIS — R0602 Shortness of breath: ICD-10-CM

## 2017-03-31 DIAGNOSIS — K639 Disease of intestine, unspecified: ICD-10-CM

## 2017-03-31 DIAGNOSIS — G43909 Migraine, unspecified, not intractable, without status migrainosus: ICD-10-CM

## 2017-03-31 DIAGNOSIS — K5090 Crohn's disease, unspecified, without complications: Principal | ICD-10-CM

## 2017-03-31 DIAGNOSIS — K219 Gastro-esophageal reflux disease without esophagitis: ICD-10-CM

## 2017-03-31 DIAGNOSIS — G40909 Epilepsy, unspecified, not intractable, without status epilepticus: ICD-10-CM

## 2017-03-31 DIAGNOSIS — I10 Essential (primary) hypertension: ICD-10-CM

## 2017-03-31 DIAGNOSIS — IMO0002 Ulcer: ICD-10-CM

## 2017-03-31 DIAGNOSIS — M797 Fibromyalgia: ICD-10-CM

## 2017-03-31 DIAGNOSIS — R42 Dizziness and giddiness: ICD-10-CM

## 2017-03-31 DIAGNOSIS — M359 Systemic involvement of connective tissue, unspecified: ICD-10-CM

## 2017-03-31 DIAGNOSIS — J302 Other seasonal allergic rhinitis: ICD-10-CM

## 2017-03-31 NOTE — Progress Notes
Date of Service: 03/31/2017    Subjective:             Tina Snyder is a 51 y.o. female.    History of Present Illness  Tina Snyder presents to my clinic today for follow-up urgently of chest pain.  She states that over the past several weeks she has had chest pain on the left side which has extended from the back to the center of the chest.  This occurs with deep inspiration.  She feels that this makes it so she is unable to take a deep breath.  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  This has been a more recent phenomenon.  It is not productive of sputum.  She denies unintentional weight loss.  Denies night sweats.  Apart from not feeling that she can take a deep breath, she is not noticing any significant difference in overall breathlessness or exertional capacity.     Review of Systems   Constitutional: Positive for fatigue and fever.   HENT: Positive for congestion, mouth sores, nosebleeds and sinus pressure.    Eyes: Positive for photophobia.   Respiratory: Positive for cough, chest tightness and shortness of breath.    Cardiovascular: Positive for palpitations and leg swelling.   Gastrointestinal: Positive for abdominal pain, constipation, diarrhea and nausea.   Genitourinary: Positive for frequency.   Musculoskeletal: Positive for arthralgias, back pain and myalgias.   Neurological: Positive for dizziness, light-headedness and headaches.   Psychiatric/Behavioral: Positive for sleep disturbance.   All other systems reviewed and are negative.        Objective:         ??? blood sugar diagnostic (ONETOUCH VERIO) test strip Use 1 Strip as directed before meals and at bedtime.   ??? budesonide (ENTOCORT EC) 3 mg capsule TAKE 3 CAPSULES BY MOUTH IN THE MORNING   ??? cyclobenzaprine (FLEXERIL) 10 mg tablet Take 10 mg by mouth daily as needed. Marland Kitchen   ??? desvenlafaxine(+) (PRISTIQ) 50 mg tablet Take 50 mg by mouth every morning. ??? diltiazem CD (CARDIZEM CD) 120 mg capsule Take 120 mg by mouth daily.   ??? flash glucose scanning reader misc Use 1 each as directed ten times daily. May check continuously ad lib   ??? flash glucose sensor kit Use 1 each as directed as directed. Change every 10 days   ??? folic acid (FOLVITE) 1 mg tablet Take 1 Tab by mouth daily.   ??? furosemide (LASIX PO) Take  by mouth.   ??? insulin aspart (niacinamide) (FIASP FLEXTOUCH U-100 INSULIN) 100 unit/mL (3 mL) inpn Inject 10 Units under the skin three times daily. + CF 1:20>140; take at first bite or up to 20 min after start of meal   ??? insulin aspart U-100 (NOVOLOG FLEXPEN U-100 INSULIN) 100 unit/mL injection PEN 15/15/20 (or Carb ratio 1u:3gm) at B/L/D + CF 1:10>140, Max: 200u/d   ??? insulin glargine (LANTUS SOLOSTAR, BASAGLAR) 100 unit/mL (3 mL) injection PEN adjusting to reach am fasting target 80-130. May use iSage app   ??? insulin pen needles (disposable) (BD UF NANO PEN NEEDLES) 32 gauge x 5/32 pen needle Use one each as directed before meals and at bedtime. Use with insulin injections. 5/d. Remove pen needle after each injection.   ??? lancets (ONE TOUCH DELICA) MISC Use 1 Each as directed before meals and at bedtime. Diag: Diabetes mellitus (E.11)   ??? meclizine (ANTIVERT) 25 mg  tablet Take 1 tablet by mouth three times daily as needed.   ??? methotrexate 25 mg/mL injection 0.6 mL by SEE ADMIN INSTRUCTIONS route every 7 days. 25 mg IM weekly   ??? naproxen (NAPROSYN) 250 mg tablet Take 500 mg by mouth at bedtime as needed. Take with food.   ??? omeprazole DR(+) (PRILOSEC) 40 mg capsule Take 1 capsule by mouth daily before breakfast.     Vitals:    03/31/17 1034   BP: 162/88   Pulse: 82   Resp: 18   Temp: 36.4 ???C (97.6 ???F)   TempSrc: Oral   SpO2: 98%   Weight: 99.3 kg (219 lb)   Height: 162.6 cm (64)     Body mass index is 37.59 kg/m???.     Physical Exam   Constitutional: She is oriented to person, place, and time. She appears well-developed and well-nourished.   HENT: Head: Normocephalic and atraumatic.   Eyes: EOM are normal. Pupils are equal, round, and reactive to light.   Neck: Neck supple. No JVD present. No tracheal deviation present.   Cardiovascular: Normal rate and regular rhythm.   Pulmonary/Chest: Effort normal. No respiratory distress. She has no wheezes. She has no rales. She exhibits no tenderness.   Musculoskeletal: She exhibits no edema.   Neurological: She is alert and oriented to person, place, and time.   Skin: No rash noted.   Psychiatric: She has a normal mood and affect. Her behavior is normal.   Nursing note and vitals reviewed.           Assessment and Plan:  Impression:  1.  Cough.  This is most likely due to underlying chronic bronchiectasis and potentially to GE reflux.  I am concerned that she does have what appears to be some progressive cough today which is associated with chest pain.  This raises the possibility that pulmonary MAI may be involved although I would see this to be less likely.  Another possibility is that she may have drug-related pulmonary hypersensitivity due to ongoing treatment for her Crohn's disease with methotrexate as well as sulfasalazine.  2.  Chronic bronchiectasis.  She has been taking Monday Wednesday Friday azithromycin in the past although is not doing this any longer.  3.  GE reflux.  This is under her usual state of control with PPI therapy.  4.  Crohn's disease, being managed by Tina Snyder.  5.  Prior history of MAI infection of the lung.  Suspect this is quiescent, although may be causative of the cough.  6.  Chest pain, feel this is most likely chest wall pain but pulmonary parenchymal causes remain a possibility, in that the chest pain may have been caused by excessive coughing.    Plan:  1.  We will obtain noncontrast CT chest to ensure that she is not suffering from pulmonary parenchymal abnormality leading to the cough which may dictate further investigation into drug-related hypersensitivity. 2.  Continue PPI per Tina Snyder.  3.  Chest pain may certainly be related to costochondritis and chest wall issues, but feel that pulmonary causes must be ruled out first.    Plan to keep previously scheduled follow-up with complete PFTs to be obtained at that visit.

## 2017-04-04 ENCOUNTER — Ambulatory Visit: Admit: 2017-04-04 | Discharge: 2017-04-04 | Payer: BC Managed Care – PPO

## 2017-04-04 DIAGNOSIS — R0602 Shortness of breath: ICD-10-CM

## 2017-04-04 DIAGNOSIS — R079 Chest pain, unspecified: Principal | ICD-10-CM

## 2017-04-05 ENCOUNTER — Encounter: Admit: 2017-04-05 | Discharge: 2017-04-05 | Payer: BC Managed Care – PPO

## 2017-04-08 ENCOUNTER — Encounter: Admit: 2017-04-08 | Discharge: 2017-04-08 | Payer: BC Managed Care – PPO

## 2017-04-08 NOTE — Telephone Encounter
Spoke to patient regarding her CT chest results. Tina Snyder verbalized understanding. Patient does reoprt that she has been having occasional black out spells for which she has now had a complete pulmonary and cardiac workup for without finding the cause. Today patient blacked out and fell and broke her foot. Patient has follow up with her PCP on Thursday who will be referring her to neurology. RN requested patient keep [place] updated with any new issues. Patient verbalized understanding and appreciative of call.    Ramonita Lab, RN

## 2017-04-08 NOTE — Telephone Encounter
-----   Message from Arrie Eastern, MD sent at 04/08/2017  2:17 PM CST -----  Could you let her know that her CT looks stable, and that I dont see any particular cause for her chest pain on this CT. I think most likely her chest pain is due to chest wall issues, and that she should talk with her PCP about potential remedies. I do not see evidence of recurrence of MAI infection. Thank you!    LP    ----- Message -----  From: Darra Lis Results  Sent: 04/04/2017   5:07 PM  To: Arrie Eastern, MD

## 2017-04-17 ENCOUNTER — Encounter: Admit: 2017-04-17 | Discharge: 2017-04-17 | Payer: BC Managed Care – PPO

## 2017-04-17 NOTE — Telephone Encounter
Initial Visit Date: 04/23/17    Reason for Visit: syncope    Is this as a result of an injury?     Physician Information  PCP: Dr. Lilli Light  Referring Physician: Dr. Ainsley Spinner at [facility] in [place], [place]  Previous Neurologist:   Other providers seen: Dr. Arrie Eastern [employer] [facility], Durwin Reges, [facility], Dr. Quillian Quince [facility], Dr. Elberta Spaniel & Vanna Scotland, PA-C [facility], Dr. Nuala Alpha [place] [facility], Dr. 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

## 2017-04-23 ENCOUNTER — Ambulatory Visit: Admit: 2017-04-23 | Discharge: 2017-04-24 | Payer: BC Managed Care – PPO

## 2017-04-23 ENCOUNTER — Encounter: Admit: 2017-04-23 | Discharge: 2017-04-23 | Payer: BC Managed Care – PPO

## 2017-04-23 DIAGNOSIS — M797 Fibromyalgia: ICD-10-CM

## 2017-04-23 DIAGNOSIS — I8290 Acute embolism and thrombosis of unspecified vein: ICD-10-CM

## 2017-04-23 DIAGNOSIS — D699 Hemorrhagic condition, unspecified: ICD-10-CM

## 2017-04-23 DIAGNOSIS — I10 Essential (primary) hypertension: ICD-10-CM

## 2017-04-23 DIAGNOSIS — K219 Gastro-esophageal reflux disease without esophagitis: ICD-10-CM

## 2017-04-23 DIAGNOSIS — M359 Systemic involvement of connective tissue, unspecified: ICD-10-CM

## 2017-04-23 DIAGNOSIS — G40909 Epilepsy, unspecified, not intractable, without status epilepticus: ICD-10-CM

## 2017-04-23 DIAGNOSIS — IMO0002 Ulcer: ICD-10-CM

## 2017-04-23 DIAGNOSIS — K5090 Crohn's disease, unspecified, without complications: Principal | ICD-10-CM

## 2017-04-23 DIAGNOSIS — K7581 Nonalcoholic steatohepatitis (NASH): ICD-10-CM

## 2017-04-23 DIAGNOSIS — R42 Dizziness and giddiness: ICD-10-CM

## 2017-04-23 DIAGNOSIS — G43909 Migraine, unspecified, not intractable, without status migrainosus: ICD-10-CM

## 2017-04-23 DIAGNOSIS — K639 Disease of intestine, unspecified: ICD-10-CM

## 2017-04-23 DIAGNOSIS — D4959 Neoplasm of unspecified behavior of other genitourinary organ: ICD-10-CM

## 2017-04-23 DIAGNOSIS — J302 Other seasonal allergic rhinitis: ICD-10-CM

## 2017-04-24 DIAGNOSIS — R55 Syncope and collapse: Principal | ICD-10-CM

## 2017-04-30 ENCOUNTER — Ambulatory Visit: Admit: 2017-04-30 | Discharge: 2017-04-30 | Payer: BC Managed Care – PPO

## 2017-04-30 DIAGNOSIS — R55 Syncope and collapse: Principal | ICD-10-CM

## 2017-04-30 LAB — POC CREATININE, RAD: Lab: 1.5 mg/dL — ABNORMAL HIGH (ref 0.4–1.00)

## 2017-04-30 MED ORDER — GADOBENATE DIMEGLUMINE 529 MG/ML (0.1MMOL/0.2ML) IV SOLN
10 mL | Freq: Once | INTRAVENOUS | 0 refills | Status: CP
Start: 2017-04-30 — End: ?
  Administered 2017-04-30: 22:00:00 10 mL via INTRAVENOUS

## 2017-05-13 ENCOUNTER — Encounter: Admit: 2017-05-13 | Discharge: 2017-05-13 | Payer: BC Managed Care – PPO

## 2017-05-29 ENCOUNTER — Encounter: Admit: 2017-05-29 | Discharge: 2017-05-29 | Payer: BC Managed Care – PPO

## 2017-06-05 ENCOUNTER — Ambulatory Visit: Admit: 2017-06-05 | Discharge: 2017-06-05 | Payer: BC Managed Care – PPO

## 2017-06-05 DIAGNOSIS — R55 Syncope and collapse: Principal | ICD-10-CM

## 2017-06-06 ENCOUNTER — Encounter: Admit: 2017-06-06 | Discharge: 2017-06-06 | Payer: BC Managed Care – PPO

## 2017-06-20 ENCOUNTER — Ambulatory Visit: Admit: 2017-06-20 | Discharge: 2017-06-20 | Payer: BC Managed Care – PPO

## 2017-06-20 DIAGNOSIS — R531 Weakness: ICD-10-CM

## 2017-06-20 DIAGNOSIS — R2689 Other abnormalities of gait and mobility: ICD-10-CM

## 2017-06-20 DIAGNOSIS — H532 Diplopia: ICD-10-CM

## 2017-06-20 DIAGNOSIS — R635 Abnormal weight gain: ICD-10-CM

## 2017-06-20 DIAGNOSIS — E1165 Type 2 diabetes mellitus with hyperglycemia: Principal | ICD-10-CM

## 2017-06-20 DIAGNOSIS — F439 Reaction to severe stress, unspecified: ICD-10-CM

## 2017-06-20 DIAGNOSIS — R0602 Shortness of breath: ICD-10-CM

## 2017-06-21 MED ORDER — INSULIN DEGLUDEC 200 UNIT/ML (3 ML) SC INPN
11 refills | Status: SS
Start: 2017-06-21 — End: 2017-06-28

## 2017-06-26 ENCOUNTER — Ambulatory Visit: Admit: 2017-06-26 | Discharge: 2017-06-26 | Payer: BC Managed Care – PPO

## 2017-06-26 ENCOUNTER — Encounter: Admit: 2017-06-26 | Discharge: 2017-06-26 | Payer: BC Managed Care – PPO

## 2017-06-26 DIAGNOSIS — G43909 Migraine, unspecified, not intractable, without status migrainosus: ICD-10-CM

## 2017-06-26 DIAGNOSIS — M359 Systemic involvement of connective tissue, unspecified: ICD-10-CM

## 2017-06-26 DIAGNOSIS — R55 Syncope and collapse: Principal | ICD-10-CM

## 2017-06-26 DIAGNOSIS — I8290 Acute embolism and thrombosis of unspecified vein: ICD-10-CM

## 2017-06-26 DIAGNOSIS — IMO0002 Ulcer: ICD-10-CM

## 2017-06-26 DIAGNOSIS — K219 Gastro-esophageal reflux disease without esophagitis: ICD-10-CM

## 2017-06-26 DIAGNOSIS — D699 Hemorrhagic condition, unspecified: ICD-10-CM

## 2017-06-26 DIAGNOSIS — I10 Essential (primary) hypertension: ICD-10-CM

## 2017-06-26 DIAGNOSIS — J302 Other seasonal allergic rhinitis: ICD-10-CM

## 2017-06-26 DIAGNOSIS — D4959 Neoplasm of unspecified behavior of other genitourinary organ: ICD-10-CM

## 2017-06-26 DIAGNOSIS — M797 Fibromyalgia: ICD-10-CM

## 2017-06-26 DIAGNOSIS — K5090 Crohn's disease, unspecified, without complications: Principal | ICD-10-CM

## 2017-06-26 DIAGNOSIS — K639 Disease of intestine, unspecified: ICD-10-CM

## 2017-06-26 DIAGNOSIS — K7581 Nonalcoholic steatohepatitis (NASH): ICD-10-CM

## 2017-06-26 DIAGNOSIS — G40909 Epilepsy, unspecified, not intractable, without status epilepticus: ICD-10-CM

## 2017-06-26 DIAGNOSIS — R42 Dizziness and giddiness: ICD-10-CM

## 2017-06-26 LAB — COMPREHENSIVE METABOLIC PANEL
Lab: 0.4 mg/dL — ABNORMAL HIGH (ref 0.3–1.2)
Lab: 1.8 mg/dL — ABNORMAL HIGH (ref 0.4–1.00)
Lab: 11 K/UL (ref 3–12)
Lab: 134 MMOL/L — ABNORMAL LOW (ref 137–147)
Lab: 16 U/L (ref 7–40)
Lab: 23 mg/dL — ABNORMAL LOW (ref 7–25)
Lab: 27 MMOL/L (ref 21–30)
Lab: 28 mL/min — ABNORMAL LOW (ref >60)
Lab: 34 U/L — ABNORMAL HIGH (ref 7–56)
Lab: 34 mL/min — ABNORMAL LOW (ref >60)
Lab: 4 MMOL/L — ABNORMAL LOW (ref 3.5–5.1)
Lab: 4.2 g/dL — ABNORMAL LOW (ref 3.5–5.0)
Lab: 6.6 g/dL (ref 6.0–8.0)
Lab: 9.4 mg/dL — ABNORMAL HIGH (ref 8.5–10.6)
Lab: 93 U/L (ref 25–110)
Lab: 96 MMOL/L — ABNORMAL LOW (ref 98–110)

## 2017-06-26 LAB — CBC AND DIFF
Lab: 0 10*3/uL (ref 0–0.20)
Lab: 12 10*3/uL — ABNORMAL HIGH (ref 4.5–11.0)
Lab: 4.1 M/UL — ABNORMAL LOW (ref 4.0–5.0)

## 2017-06-27 ENCOUNTER — Encounter: Admit: 2017-06-27 | Discharge: 2017-06-27 | Payer: BC Managed Care – PPO

## 2017-06-27 ENCOUNTER — Ambulatory Visit: Admit: 2017-06-27 | Discharge: 2017-06-28 | Payer: BC Managed Care – PPO

## 2017-06-27 ENCOUNTER — Ambulatory Visit: Admit: 2017-06-27 | Discharge: 2017-06-27 | Payer: BC Managed Care – PPO

## 2017-06-27 DIAGNOSIS — G40909 Epilepsy, unspecified, not intractable, without status epilepticus: ICD-10-CM

## 2017-06-27 DIAGNOSIS — M359 Systemic involvement of connective tissue, unspecified: ICD-10-CM

## 2017-06-27 DIAGNOSIS — D4959 Neoplasm of unspecified behavior of other genitourinary organ: ICD-10-CM

## 2017-06-27 DIAGNOSIS — J302 Other seasonal allergic rhinitis: ICD-10-CM

## 2017-06-27 DIAGNOSIS — G43909 Migraine, unspecified, not intractable, without status migrainosus: ICD-10-CM

## 2017-06-27 DIAGNOSIS — K639 Disease of intestine, unspecified: ICD-10-CM

## 2017-06-27 DIAGNOSIS — IMO0002 Ulcer: ICD-10-CM

## 2017-06-27 DIAGNOSIS — R42 Dizziness and giddiness: ICD-10-CM

## 2017-06-27 DIAGNOSIS — I10 Essential (primary) hypertension: ICD-10-CM

## 2017-06-27 DIAGNOSIS — M797 Fibromyalgia: ICD-10-CM

## 2017-06-27 DIAGNOSIS — I8290 Acute embolism and thrombosis of unspecified vein: ICD-10-CM

## 2017-06-27 DIAGNOSIS — K7581 Nonalcoholic steatohepatitis (NASH): ICD-10-CM

## 2017-06-27 DIAGNOSIS — K219 Gastro-esophageal reflux disease without esophagitis: ICD-10-CM

## 2017-06-27 DIAGNOSIS — K5090 Crohn's disease, unspecified, without complications: Principal | ICD-10-CM

## 2017-06-27 DIAGNOSIS — D699 Hemorrhagic condition, unspecified: ICD-10-CM

## 2017-06-27 LAB — POC GLUCOSE: Lab: 177 mg/dL — ABNORMAL HIGH (ref 70–100)

## 2017-06-27 MED ORDER — INSULIN ASPART 100 UNIT/ML SC FLEXPEN
15 [IU] | Freq: Three times a day (TID) | SUBCUTANEOUS | 0 refills | Status: DC
Start: 2017-06-27 — End: 2017-07-05
  Administered 2017-06-28: 01:00:00 15 [IU] via SUBCUTANEOUS

## 2017-06-27 MED ORDER — INSULIN ASPART 100 UNIT/ML SC FLEXPEN
0-12 [IU] | Freq: Before meals | SUBCUTANEOUS | 0 refills | Status: DC
Start: 2017-06-27 — End: 2017-07-05

## 2017-06-27 MED ORDER — ONDANSETRON HCL 4 MG PO TAB
4 mg | ORAL | 0 refills | Status: DC | PRN
Start: 2017-06-27 — End: 2017-07-05
  Administered 2017-06-30 – 2017-07-01 (×2): 4 mg via ORAL

## 2017-06-27 MED ORDER — INSULIN GLARGINE 100 UNIT/ML (3 ML) SC INJ PEN
86 [IU] | Freq: Two times a day (BID) | SUBCUTANEOUS | 0 refills | Status: DC
Start: 2017-06-27 — End: 2017-06-30
  Administered 2017-06-28 – 2017-06-29 (×2): 86 [IU] via SUBCUTANEOUS

## 2017-06-27 MED ORDER — SODIUM CHLORIDE 0.9 % IV SOLP
INTRAVENOUS | 0 refills | Status: DC
Start: 2017-06-27 — End: 2017-06-29
  Administered 2017-06-27 – 2017-06-29 (×3): 1000.000 mL via INTRAVENOUS

## 2017-06-27 MED ORDER — VENLAFAXINE 75 MG PO CP24
75 mg | Freq: Every day | ORAL | 0 refills | Status: DC
Start: 2017-06-27 — End: 2017-06-28
  Administered 2017-06-28: 14:00:00 75 mg via ORAL

## 2017-06-27 MED ORDER — PANTOPRAZOLE 40 MG PO TBEC
80 mg | Freq: Every day | ORAL | 0 refills | Status: DC
Start: 2017-06-27 — End: 2017-07-05
  Administered 2017-06-28 – 2017-07-03 (×5): 80 mg via ORAL

## 2017-06-27 MED ORDER — OXYCODONE 5 MG PO TAB
5 mg | ORAL | 0 refills | Status: DC | PRN
Start: 2017-06-27 — End: 2017-07-05
  Administered 2017-06-28 – 2017-07-04 (×22): 5 mg via ORAL

## 2017-06-27 MED ORDER — BUDESONIDE 3 MG PO CECX
3 mg | Freq: Every day | ORAL | 0 refills | Status: DC
Start: 2017-06-27 — End: 2017-07-05
  Administered 2017-06-28 – 2017-07-04 (×7): 3 mg via ORAL

## 2017-06-28 ENCOUNTER — Encounter: Admit: 2017-06-28 | Discharge: 2017-06-28 | Payer: BC Managed Care – PPO

## 2017-06-28 DIAGNOSIS — I8290 Acute embolism and thrombosis of unspecified vein: ICD-10-CM

## 2017-06-28 DIAGNOSIS — R42 Dizziness and giddiness: ICD-10-CM

## 2017-06-28 DIAGNOSIS — R55 Syncope and collapse: ICD-10-CM

## 2017-06-28 DIAGNOSIS — G43909 Migraine, unspecified, not intractable, without status migrainosus: ICD-10-CM

## 2017-06-28 DIAGNOSIS — D4959 Neoplasm of unspecified behavior of other genitourinary organ: ICD-10-CM

## 2017-06-28 DIAGNOSIS — K219 Gastro-esophageal reflux disease without esophagitis: ICD-10-CM

## 2017-06-28 DIAGNOSIS — G40909 Epilepsy, unspecified, not intractable, without status epilepticus: ICD-10-CM

## 2017-06-28 DIAGNOSIS — M359 Systemic involvement of connective tissue, unspecified: ICD-10-CM

## 2017-06-28 DIAGNOSIS — J302 Other seasonal allergic rhinitis: ICD-10-CM

## 2017-06-28 DIAGNOSIS — K7581 Nonalcoholic steatohepatitis (NASH): ICD-10-CM

## 2017-06-28 DIAGNOSIS — K639 Disease of intestine, unspecified: ICD-10-CM

## 2017-06-28 DIAGNOSIS — E139 Other specified diabetes mellitus without complications: ICD-10-CM

## 2017-06-28 DIAGNOSIS — K5090 Crohn's disease, unspecified, without complications: Principal | ICD-10-CM

## 2017-06-28 DIAGNOSIS — M797 Fibromyalgia: ICD-10-CM

## 2017-06-28 DIAGNOSIS — IMO0002 Ulcer: ICD-10-CM

## 2017-06-28 DIAGNOSIS — I10 Essential (primary) hypertension: ICD-10-CM

## 2017-06-28 DIAGNOSIS — D699 Hemorrhagic condition, unspecified: ICD-10-CM

## 2017-06-28 LAB — URINALYSIS, MICROSCOPIC

## 2017-06-28 LAB — CBC AND DIFF
Lab: 0 10*3/uL (ref 0–0.20)
Lab: 3.9 M/UL — ABNORMAL LOW (ref 4.0–5.0)
Lab: 8.8 K/UL — ABNORMAL HIGH (ref 4.5–11.0)
Lab: 0 % (ref 0–2)
Lab: 0 10*3/uL (ref 0–0.20)
Lab: 0.1 10*3/uL (ref 0–0.45)
Lab: 1 % (ref 0–5)
Lab: 1 10*3/uL — ABNORMAL HIGH (ref 0–0.80)
Lab: 11 10*3/uL — ABNORMAL HIGH (ref 4.5–11.0)
Lab: 17 pg — ABNORMAL LOW (ref 26–34)
Lab: 21 % — ABNORMAL HIGH (ref 11–15)
Lab: 25 % (ref 24–44)
Lab: 26 % — ABNORMAL LOW (ref 36–45)
Lab: 29 g/dL — ABNORMAL LOW (ref 32.0–36.0)
Lab: 3 10*3/uL (ref 1.0–4.8)
Lab: 4.3 M/UL (ref 4.0–5.0)
Lab: 61 FL — ABNORMAL LOW (ref 80–100)
Lab: 66 % (ref 41–77)
Lab: 7.7 10*3/uL — ABNORMAL HIGH (ref 1.8–7.0)
Lab: 7.7 g/dL — ABNORMAL LOW (ref 12.0–15.0)
Lab: 7.8 FL (ref 7–11)
Lab: 8 % (ref 4–12)

## 2017-06-28 LAB — POC GLUCOSE
Lab: 202 mg/dL — ABNORMAL HIGH (ref 70–100)
Lab: 115 mg/dL — ABNORMAL HIGH (ref 70–100)
Lab: 148 mg/dL — ABNORMAL HIGH (ref 70–100)
Lab: 230 mg/dL — ABNORMAL HIGH (ref 70–100)

## 2017-06-28 LAB — URINALYSIS DIPSTICK
Lab: NEGATIVE % (ref 0–2)
Lab: NEGATIVE % — ABNORMAL LOW (ref 0–5)
Lab: NEGATIVE % — ABNORMAL LOW (ref 4–12)
Lab: NEGATIVE 10*3/uL (ref 1.0–4.8)
Lab: POSITIVE 10*3/uL — AB (ref 0–0.45)

## 2017-06-28 LAB — PROTIME INR (PT): Lab: 1 g/dL — ABNORMAL LOW (ref 0.8–1.2)

## 2017-06-28 LAB — FOLATE, SERUM: Lab: 13 ng/mL — ABNORMAL LOW (ref >3.9)

## 2017-06-28 LAB — VITAMIN B12: Lab: 309 pg/mL — ABNORMAL HIGH (ref 180–914)

## 2017-06-28 LAB — IRON + BINDING CAPACITY + %SAT+ FERRITIN: Lab: 12 ug/dL — ABNORMAL LOW (ref 50–160)

## 2017-06-28 LAB — TSH WITH FREE T4 REFLEX: Lab: 1.1 uU/mL — ABNORMAL LOW (ref 0.35–5.00)

## 2017-06-28 LAB — CBC: Lab: 7.9 K/UL — ABNORMAL LOW (ref >60)

## 2017-06-28 LAB — COMPREHENSIVE METABOLIC PANEL: Lab: 139 MMOL/L — ABNORMAL LOW (ref 137–147)

## 2017-06-28 MED ORDER — ACETAMINOPHEN 325 MG PO TAB
650 mg | ORAL | 0 refills | Status: DC | PRN
Start: 2017-06-28 — End: 2017-07-05
  Administered 2017-06-28 – 2017-07-04 (×21): 650 mg via ORAL

## 2017-06-28 MED ORDER — CYANOCOBALAMIN (VITAMIN B-12) 500 MCG PO TAB
1000 ug | Freq: Every day | ORAL | 0 refills | Status: DC
Start: 2017-06-28 — End: 2017-07-05
  Administered 2017-06-28 – 2017-07-04 (×7): 1000 ug via ORAL

## 2017-06-28 MED ORDER — FLASH GLUCOSE SENSOR MISC KIT
PACK | ORAL | 11 refills | 50.00000 days | Status: AC
Start: 2017-06-28 — End: 2017-11-27

## 2017-06-28 MED ORDER — GLUCAGON HCL 1 MG IJ SOLR
1 mg | Freq: Once | INTRAMUSCULAR | 0 refills | Status: CP
Start: 2017-06-28 — End: ?
  Administered 2017-06-28: 23:00:00 1 mg via INTRAMUSCULAR

## 2017-06-28 MED ORDER — BREEZA FOR NEUTRAL ABDOMINAL/PELVIC IMAGING PO SOLN
1500 mL | Freq: Once | ORAL | 0 refills | Status: CP
Start: 2017-06-28 — End: ?

## 2017-06-28 MED ORDER — CYCLOBENZAPRINE 10 MG PO TAB
10 mg | Freq: Three times a day (TID) | ORAL | 0 refills | Status: DC | PRN
Start: 2017-06-28 — End: 2017-07-05
  Administered 2017-06-29 – 2017-07-04 (×11): 10 mg via ORAL

## 2017-06-28 MED ORDER — DULOXETINE 60 MG PO CPDR
60 mg | Freq: Every day | ORAL | 0 refills | Status: DC
Start: 2017-06-28 — End: 2017-07-05
  Administered 2017-06-28 – 2017-07-04 (×7): 60 mg via ORAL

## 2017-06-28 MED ORDER — IOHEXOL 350 MG IODINE/ML IV SOLN
80 mL | Freq: Once | INTRAVENOUS | 0 refills | Status: CP
Start: 2017-06-28 — End: ?
  Administered 2017-06-28: 23:00:00 80 mL via INTRAVENOUS

## 2017-06-28 MED ORDER — FLASH GLUCOSE SCANNING READER MISC MISC
ORAL | 1 refills | 30.00000 days | Status: AC
Start: 2017-06-28 — End: 2017-11-27

## 2017-06-28 MED ORDER — INSULIN GLARGINE 100 UNIT/ML (3 ML) SC INJ PEN
11 refills | 68.00000 days | Status: AC
Start: 2017-06-28 — End: 2017-07-08

## 2017-06-28 MED ORDER — SODIUM CHLORIDE 0.9 % IJ SOLN
50 mL | Freq: Once | INTRAVENOUS | 0 refills | Status: CP
Start: 2017-06-28 — End: ?
  Administered 2017-06-28: 23:00:00 50 mL via INTRAVENOUS

## 2017-06-28 MED ORDER — DILTIAZEM HCL 120 MG PO CP24
120 mg | Freq: Every day | ORAL | 0 refills | Status: DC
Start: 2017-06-28 — End: 2017-07-05
  Administered 2017-06-28 – 2017-07-04 (×7): 120 mg via ORAL

## 2017-06-28 MED ORDER — DESVENLAFAXINE SUCCINATE 50 MG PO TB24
50 mg | Freq: Every day | ORAL | 0 refills | Status: DC
Start: 2017-06-28 — End: 2017-07-05
  Administered 2017-06-29 – 2017-07-04 (×6): 50 mg via ORAL

## 2017-06-29 ENCOUNTER — Encounter: Admit: 2017-06-29 | Discharge: 2017-06-29 | Payer: BC Managed Care – PPO

## 2017-06-29 DIAGNOSIS — R55 Syncope and collapse: Principal | ICD-10-CM

## 2017-06-29 LAB — CBC
Lab: 7.9 K/UL (ref 4.5–11.0)
Lab: 3.8 M/UL — ABNORMAL LOW (ref 4.0–5.0)
Lab: 382 10*3/uL (ref 150–400)
Lab: 7.8 10*3/uL (ref 4.5–11.0)
Lab: 8.5 FL (ref 7–11)

## 2017-06-29 LAB — C DIFFICILE BY PCR

## 2017-06-29 LAB — POC GLUCOSE
Lab: 206 mg/dL — ABNORMAL HIGH (ref 70–100)
Lab: 86 mg/dL (ref 70–100)
Lab: 73 mg/dL (ref 70–100)
Lab: 105 mg/dL — ABNORMAL HIGH (ref 70–100)

## 2017-06-29 LAB — COMPREHENSIVE METABOLIC PANEL: Lab: 141 MMOL/L — ABNORMAL LOW (ref 137–147)

## 2017-06-29 LAB — C REACTIVE PROTEIN (CRP): Lab: 0.2 mg/dL — ABNORMAL LOW (ref <1.0)

## 2017-06-29 LAB — SED RATE: Lab: 3 mm/h — ABNORMAL LOW (ref 0–30)

## 2017-06-29 MED ORDER — POTASSIUM CHLORIDE 20 MEQ PO TBTQ
40 meq | ORAL | 0 refills | Status: CP
Start: 2017-06-29 — End: ?
  Administered 2017-06-29 (×2): 40 meq via ORAL

## 2017-06-29 MED ORDER — PEG-ELECTROLYTE SOLN 420 GRAM PO SOLR
4 L | Freq: Once | ORAL | 0 refills | Status: CP
Start: 2017-06-29 — End: ?
  Administered 2017-06-29: 18:00:00 4 L via ORAL

## 2017-06-29 MED ORDER — HYDRALAZINE 20 MG/ML IJ SOLN
10 mg | INTRAVENOUS | 0 refills | Status: DC | PRN
Start: 2017-06-29 — End: 2017-07-05
  Administered 2017-06-30 – 2017-07-02 (×5): 10 mg via INTRAVENOUS

## 2017-06-30 ENCOUNTER — Encounter: Admit: 2017-06-30 | Discharge: 2017-06-30 | Payer: BC Managed Care – PPO

## 2017-06-30 ENCOUNTER — Inpatient Hospital Stay: Admit: 2017-06-30 | Discharge: 2017-06-30 | Payer: BC Managed Care – PPO

## 2017-06-30 DIAGNOSIS — I8290 Acute embolism and thrombosis of unspecified vein: ICD-10-CM

## 2017-06-30 DIAGNOSIS — D699 Hemorrhagic condition, unspecified: ICD-10-CM

## 2017-06-30 DIAGNOSIS — K5090 Crohn's disease, unspecified, without complications: Principal | ICD-10-CM

## 2017-06-30 DIAGNOSIS — K7581 Nonalcoholic steatohepatitis (NASH): ICD-10-CM

## 2017-06-30 DIAGNOSIS — K219 Gastro-esophageal reflux disease without esophagitis: ICD-10-CM

## 2017-06-30 DIAGNOSIS — G43909 Migraine, unspecified, not intractable, without status migrainosus: ICD-10-CM

## 2017-06-30 DIAGNOSIS — M797 Fibromyalgia: ICD-10-CM

## 2017-06-30 DIAGNOSIS — E139 Other specified diabetes mellitus without complications: ICD-10-CM

## 2017-06-30 DIAGNOSIS — R42 Dizziness and giddiness: ICD-10-CM

## 2017-06-30 DIAGNOSIS — K639 Disease of intestine, unspecified: ICD-10-CM

## 2017-06-30 DIAGNOSIS — G40909 Epilepsy, unspecified, not intractable, without status epilepticus: ICD-10-CM

## 2017-06-30 DIAGNOSIS — D4959 Neoplasm of unspecified behavior of other genitourinary organ: ICD-10-CM

## 2017-06-30 DIAGNOSIS — IMO0002 Ulcer: ICD-10-CM

## 2017-06-30 DIAGNOSIS — R55 Syncope and collapse: Principal | ICD-10-CM

## 2017-06-30 DIAGNOSIS — M359 Systemic involvement of connective tissue, unspecified: ICD-10-CM

## 2017-06-30 DIAGNOSIS — J302 Other seasonal allergic rhinitis: ICD-10-CM

## 2017-06-30 DIAGNOSIS — I10 Essential (primary) hypertension: ICD-10-CM

## 2017-06-30 LAB — POC GLUCOSE
Lab: 73 mg/dL (ref 70–100)
Lab: 80 mg/dL (ref 70–100)
Lab: 86 mg/dL (ref 70–100)
Lab: 111 mg/dL — ABNORMAL HIGH (ref 70–100)
Lab: 117 mg/dL — ABNORMAL HIGH (ref 70–100)
Lab: 124 mg/dL — ABNORMAL HIGH (ref 70–100)
Lab: 190 mg/dL — ABNORMAL HIGH (ref 70–100)

## 2017-06-30 LAB — CBC
Lab: 19 pg — ABNORMAL LOW (ref 26–34)
Lab: 24 % — ABNORMAL HIGH (ref 11–15)
Lab: 30 % — ABNORMAL LOW (ref 36–45)
Lab: 30 g/dL — ABNORMAL LOW (ref 32.0–36.0)
Lab: 397 10*3/uL (ref 150–400)
Lab: 4.7 M/UL (ref 4.0–5.0)
Lab: 63 FL — ABNORMAL LOW (ref 80–100)
Lab: 7.7 FL (ref 7–11)
Lab: 8.7 10*3/uL (ref 4.5–11.0)
Lab: 9.2 g/dL — ABNORMAL LOW (ref 12.0–15.0)
Lab: 8.6 K/UL (ref 4.5–11.0)

## 2017-06-30 LAB — COMPREHENSIVE METABOLIC PANEL: Lab: 140 MMOL/L (ref 137–147)

## 2017-06-30 LAB — PERIPHERAL SMEAR

## 2017-06-30 MED ORDER — FUROSEMIDE 40 MG PO TAB
40 mg | Freq: Once | ORAL | 0 refills | Status: CP
Start: 2017-06-30 — End: ?
  Administered 2017-06-30: 20:00:00 40 mg via ORAL

## 2017-06-30 MED ORDER — MAGNESIUM SULFATE IN D5W 1 GRAM/100 ML IV PGBK
1 g | Freq: Once | INTRAVENOUS | 0 refills | Status: CP
Start: 2017-06-30 — End: ?
  Administered 2017-07-01: 03:00:00 1 g via INTRAVENOUS

## 2017-06-30 MED ORDER — LACTATED RINGERS IV SOLP
1000 mL | Freq: Once | INTRAVENOUS | 0 refills | Status: CP
Start: 2017-06-30 — End: ?
  Administered 2017-06-30: 18:00:00 1000.000 mL via INTRAVENOUS

## 2017-06-30 MED ORDER — INSULIN GLARGINE 100 UNIT/ML (3 ML) SC INJ PEN
86 [IU] | Freq: Two times a day (BID) | SUBCUTANEOUS | 0 refills | Status: DC
Start: 2017-06-30 — End: 2017-07-05
  Administered 2017-07-01 (×2): 86 [IU] via SUBCUTANEOUS

## 2017-06-30 MED ORDER — KETAMINE 10 MG/ML IJ SOLN
0 refills | Status: DC
Start: 2017-06-30 — End: 2017-06-30
  Administered 2017-06-30: 18:00:00 10 mg via INTRAVENOUS
  Administered 2017-06-30: 18:00:00 20 mg via INTRAVENOUS

## 2017-06-30 MED ORDER — PROPOFOL INJ 10 MG/ML IV VIAL
0 refills | Status: DC
Start: 2017-06-30 — End: 2017-06-30
  Administered 2017-06-30 (×2): 20 mg via INTRAVENOUS
  Administered 2017-06-30: 18:00:00 50 mg via INTRAVENOUS
  Administered 2017-06-30: 18:00:00 30 mg via INTRAVENOUS
  Administered 2017-06-30: 18:00:00 20 mg via INTRAVENOUS

## 2017-06-30 MED ORDER — LIDOCAINE (PF) 200 MG/10 ML (2 %) IJ SYRG
0 refills | Status: DC
Start: 2017-06-30 — End: 2017-06-30
  Administered 2017-06-30: 18:00:00 80 mg via INTRAVENOUS

## 2017-06-30 MED ORDER — PROPOFOL 10 MG/ML IV EMUL 20 ML (INFUSION)(AM)(OR)
INTRAVENOUS | 0 refills | Status: DC
Start: 2017-06-30 — End: 2017-06-30

## 2017-06-30 MED ORDER — LABETALOL 5 MG/ML IV SYRG
10 mg | Freq: Once | INTRAVENOUS | 0 refills | Status: CP
Start: 2017-06-30 — End: ?
  Administered 2017-06-30: 10:00:00 10 mg via INTRAVENOUS

## 2017-06-30 MED ORDER — DIPHENHYDRAMINE HCL 50 MG/ML IJ SOLN
25 mg | Freq: Once | INTRAVENOUS | 0 refills | Status: CP
Start: 2017-06-30 — End: ?
  Administered 2017-07-01: 03:00:00 25 mg via INTRAVENOUS

## 2017-07-01 ENCOUNTER — Encounter: Admit: 2017-07-01 | Discharge: 2017-07-01 | Payer: BC Managed Care – PPO

## 2017-07-01 DIAGNOSIS — D4959 Neoplasm of unspecified behavior of other genitourinary organ: ICD-10-CM

## 2017-07-01 DIAGNOSIS — K7581 Nonalcoholic steatohepatitis (NASH): ICD-10-CM

## 2017-07-01 DIAGNOSIS — G40909 Epilepsy, unspecified, not intractable, without status epilepticus: ICD-10-CM

## 2017-07-01 DIAGNOSIS — M797 Fibromyalgia: ICD-10-CM

## 2017-07-01 DIAGNOSIS — D699 Hemorrhagic condition, unspecified: ICD-10-CM

## 2017-07-01 DIAGNOSIS — K5090 Crohn's disease, unspecified, without complications: Principal | ICD-10-CM

## 2017-07-01 DIAGNOSIS — I8290 Acute embolism and thrombosis of unspecified vein: ICD-10-CM

## 2017-07-01 DIAGNOSIS — R42 Dizziness and giddiness: ICD-10-CM

## 2017-07-01 DIAGNOSIS — G43909 Migraine, unspecified, not intractable, without status migrainosus: ICD-10-CM

## 2017-07-01 DIAGNOSIS — E139 Other specified diabetes mellitus without complications: ICD-10-CM

## 2017-07-01 DIAGNOSIS — M359 Systemic involvement of connective tissue, unspecified: ICD-10-CM

## 2017-07-01 DIAGNOSIS — K219 Gastro-esophageal reflux disease without esophagitis: ICD-10-CM

## 2017-07-01 DIAGNOSIS — IMO0002 Ulcer: ICD-10-CM

## 2017-07-01 DIAGNOSIS — J302 Other seasonal allergic rhinitis: ICD-10-CM

## 2017-07-01 DIAGNOSIS — I10 Essential (primary) hypertension: ICD-10-CM

## 2017-07-01 DIAGNOSIS — K639 Disease of intestine, unspecified: ICD-10-CM

## 2017-07-01 LAB — COMPREHENSIVE METABOLIC PANEL
Lab: 139 MMOL/L — ABNORMAL HIGH (ref 137–147)
Lab: 139 MMOL/L (ref 137–147)
Lab: 23 MMOL/L (ref 21–30)
Lab: 28 U/L (ref 7–56)
Lab: 41 mL/min — ABNORMAL LOW (ref >60)
Lab: 49 mL/min — ABNORMAL LOW (ref >60)
Lab: 9 (ref 3–12)

## 2017-07-01 LAB — BETA HYDROXYBUTYRATE (KETONES): Lab: 0.2 MMOL/L — ABNORMAL LOW (ref <0.3)

## 2017-07-01 LAB — POC GLUCOSE
Lab: 142 mg/dL — ABNORMAL HIGH (ref 70–100)
Lab: 206 mg/dL — ABNORMAL HIGH (ref 70–100)
Lab: 199 mg/dL — ABNORMAL HIGH (ref 70–100)
Lab: 239 mg/dL — ABNORMAL HIGH (ref 70–100)

## 2017-07-01 LAB — CULTURE-FECES W/SENSITIVITY

## 2017-07-01 LAB — LACTIC ACID(LACTATE): Lab: 1.6 MMOL/L (ref 0.5–2.0)

## 2017-07-01 LAB — CBC
Lab: 16 K/UL — ABNORMAL HIGH (ref 4.5–11.0)
Lab: 8.6 K/UL (ref 4.5–11.0)

## 2017-07-01 LAB — D-DIMER: Lab: 625 ng{FEU}/mL — ABNORMAL HIGH (ref <500)

## 2017-07-01 MED ORDER — DIPHENHYDRAMINE HCL 50 MG/ML IJ SOLN
25 mg | Freq: Once | INTRAVENOUS | 0 refills | Status: CP
Start: 2017-07-01 — End: ?
  Administered 2017-07-01: 21:00:00 25 mg via INTRAVENOUS

## 2017-07-01 MED ORDER — IRON SUCROSE IVPB
300 mg | Freq: Every day | INTRAVENOUS | 0 refills | Status: DC
Start: 2017-07-01 — End: 2017-07-03
  Administered 2017-07-01 (×2): 300 mg via INTRAVENOUS

## 2017-07-01 MED ORDER — MAGNESIUM SULFATE IN D5W 1 GRAM/100 ML IV PGBK
1 g | INTRAVENOUS | 0 refills | Status: CP
Start: 2017-07-01 — End: ?
  Administered 2017-07-01 (×2): 1 g via INTRAVENOUS

## 2017-07-01 MED ORDER — FUROSEMIDE 10 MG/ML IJ SOLN
40 mg | Freq: Once | INTRAVENOUS | 0 refills | Status: CP
Start: 2017-07-01 — End: ?
  Administered 2017-07-01: 20:00:00 40 mg via INTRAVENOUS

## 2017-07-01 MED ORDER — SUMATRIPTAN SUCCINATE 50 MG PO TAB
50 mg | Freq: Once | ORAL | 0 refills | Status: AC
Start: 2017-07-01 — End: ?

## 2017-07-01 MED ORDER — CYANOCOBALAMIN (VITAMIN B-12) 1,000 MCG/ML IJ SOLN
1000 ug | Freq: Once | INTRAMUSCULAR | 0 refills | Status: CP
Start: 2017-07-01 — End: ?
  Administered 2017-07-01: 21:00:00 1000 ug via INTRAMUSCULAR

## 2017-07-01 MED ORDER — FOLIC ACID 1 MG PO TAB
1 mg | Freq: Every day | ORAL | 0 refills | Status: DC
Start: 2017-07-01 — End: 2017-07-05
  Administered 2017-07-01 – 2017-07-04 (×4): 1 mg via ORAL

## 2017-07-02 ENCOUNTER — Inpatient Hospital Stay
Admit: 2017-06-27 | Discharge: 2017-07-04 | Disposition: A | Discharge status: Home / Self Care | Payer: BC Managed Care – PPO

## 2017-07-02 ENCOUNTER — Encounter: Admit: 2017-07-02 | Discharge: 2017-07-02 | Payer: BC Managed Care – PPO

## 2017-07-02 LAB — POC GLUCOSE
Lab: 235 mg/dL — ABNORMAL HIGH (ref 70–100)
Lab: 167 mg/dL — ABNORMAL HIGH (ref 70–100)
Lab: 283 mg/dL — ABNORMAL HIGH (ref 70–100)
Lab: 238 mg/dL — ABNORMAL HIGH (ref 70–100)

## 2017-07-02 LAB — CBC: Lab: 7 10*3/uL — ABNORMAL LOW (ref 4.5–11.0)

## 2017-07-02 LAB — COMPREHENSIVE METABOLIC PANEL: Lab: 140 MMOL/L — ABNORMAL LOW (ref 137–147)

## 2017-07-02 MED ORDER — MAGNESIUM SULFATE IN D5W 1 GRAM/100 ML IV PGBK
1 g | Freq: Once | INTRAVENOUS | 0 refills | Status: CP
Start: 2017-07-02 — End: ?
  Administered 2017-07-03: 02:00:00 1 g via INTRAVENOUS

## 2017-07-02 MED ORDER — DIPHENHYDRAMINE HCL 50 MG/ML IJ SOLN
25 mg | Freq: Once | INTRAVENOUS | 0 refills | Status: CP
Start: 2017-07-02 — End: ?
  Administered 2017-07-03: 02:00:00 25 mg via INTRAVENOUS

## 2017-07-02 MED ORDER — NEBIVOLOL 5 MG PO TAB
5 mg | Freq: Once | ORAL | 0 refills | Status: CP
Start: 2017-07-02 — End: ?
  Administered 2017-07-02: 23:00:00 5 mg via ORAL

## 2017-07-02 MED ORDER — HYDRALAZINE 50 MG PO TAB
50 mg | Freq: Three times a day (TID) | ORAL | 0 refills | Status: DC
Start: 2017-07-02 — End: 2017-07-05
  Administered 2017-07-02 – 2017-07-04 (×6): 50 mg via ORAL

## 2017-07-02 MED ORDER — NEBIVOLOL 2.5 MG PO TAB
2.5 mg | Freq: Every day | ORAL | 0 refills | Status: DC
Start: 2017-07-02 — End: 2017-07-02
  Administered 2017-07-02: 16:00:00 2.5 mg via ORAL

## 2017-07-02 MED ORDER — NEBIVOLOL 10 MG PO TAB
10 mg | Freq: Every day | ORAL | 0 refills | Status: DC
Start: 2017-07-02 — End: 2017-07-05
  Administered 2017-07-03 – 2017-07-04 (×2): 10 mg via ORAL

## 2017-07-02 MED ORDER — FUROSEMIDE 40 MG PO TAB
40 mg | Freq: Two times a day (BID) | ORAL | 0 refills | Status: DC
Start: 2017-07-02 — End: 2017-07-05
  Administered 2017-07-02 – 2017-07-04 (×5): 40 mg via ORAL

## 2017-07-02 MED ORDER — HYDRALAZINE 25 MG PO TAB
25 mg | ORAL | 0 refills | Status: DC | PRN
Start: 2017-07-02 — End: 2017-07-02
  Administered 2017-07-02: 21:00:00 25 mg via ORAL

## 2017-07-03 ENCOUNTER — Inpatient Hospital Stay: Admit: 2017-07-03 | Discharge: 2017-07-03 | Payer: BC Managed Care – PPO

## 2017-07-03 ENCOUNTER — Encounter: Admit: 2017-07-03 | Discharge: 2017-07-03 | Payer: BC Managed Care – PPO

## 2017-07-03 LAB — POC GLUCOSE
Lab: 222 mg/dL — ABNORMAL HIGH (ref 70–100)
Lab: 128 mg/dL — ABNORMAL HIGH (ref 70–100)
Lab: 195 mg/dL — ABNORMAL HIGH (ref 70–100)
Lab: 269 mg/dL — ABNORMAL HIGH (ref 70–100)

## 2017-07-03 LAB — ZINC: Lab: 0.6 % — ABNORMAL HIGH (ref >60)

## 2017-07-03 LAB — COPPER: Lab: 0.9 M/UL — ABNORMAL LOW (ref >60)

## 2017-07-03 MED ORDER — RP DX TC-99M MAA MCI
5 | Freq: Once | INTRAVENOUS | 0 refills | Status: CP
Start: 2017-07-03 — End: ?
  Administered 2017-07-03: 21:00:00 5.5 via INTRAVENOUS

## 2017-07-03 MED ORDER — DIPHENHYDRAMINE HCL 25 MG PO CAP
25 mg | Freq: Once | ORAL | 0 refills | Status: CP
Start: 2017-07-03 — End: ?
  Administered 2017-07-03: 25 mg via ORAL

## 2017-07-03 MED ORDER — RP DX XE-133 XENON MCI
33 | Freq: Once | RESPIRATORY_TRACT | 0 refills | Status: CP
Start: 2017-07-03 — End: ?
  Administered 2017-07-03: 21:00:00 33 via RESPIRATORY_TRACT

## 2017-07-03 MED ORDER — IRON DEXTRAN IVPB
975 mg | Freq: Once | INTRAVENOUS | 0 refills | Status: CP
Start: 2017-07-03 — End: ?
  Administered 2017-07-03 (×2): 975 mg via INTRAVENOUS

## 2017-07-03 MED ORDER — IRON DEXTRAN IVPB
25 mg | Freq: Once | INTRAVENOUS | 0 refills | Status: CP
Start: 2017-07-03 — End: ?
  Administered 2017-07-03 (×2): 25 mg via INTRAVENOUS

## 2017-07-03 MED ORDER — ACETAMINOPHEN 325 MG PO TAB
650 mg | Freq: Once | ORAL | 0 refills | Status: CP
Start: 2017-07-03 — End: ?
  Administered 2017-07-03: 650 mg via ORAL

## 2017-07-03 MED FILL — FLASH GLUCOSE SENSOR MISC KIT: 28 days supply | Qty: 2 | Status: CN

## 2017-07-04 ENCOUNTER — Inpatient Hospital Stay: Admit: 2017-06-28 | Discharge: 2017-06-28 | Payer: BC Managed Care – PPO

## 2017-07-04 ENCOUNTER — Inpatient Hospital Stay: Admit: 2017-07-02 | Discharge: 2017-07-02 | Payer: BC Managed Care – PPO

## 2017-07-04 ENCOUNTER — Encounter: Admit: 2017-07-04 | Discharge: 2017-07-04 | Payer: BC Managed Care – PPO

## 2017-07-04 ENCOUNTER — Inpatient Hospital Stay: Admit: 2017-06-29 | Discharge: 2017-06-29 | Payer: BC Managed Care – PPO

## 2017-07-04 ENCOUNTER — Inpatient Hospital Stay: Admit: 2017-06-30 | Discharge: 2017-06-30 | Payer: BC Managed Care – PPO

## 2017-07-04 ENCOUNTER — Inpatient Hospital Stay: Admit: 2017-07-01 | Discharge: 2017-07-01 | Payer: BC Managed Care – PPO

## 2017-07-04 DIAGNOSIS — N183 Chronic kidney disease, stage 3 (moderate): ICD-10-CM

## 2017-07-04 DIAGNOSIS — Z794 Long term (current) use of insulin: ICD-10-CM

## 2017-07-04 DIAGNOSIS — E538 Deficiency of other specified B group vitamins: ICD-10-CM

## 2017-07-04 DIAGNOSIS — S82201D Unspecified fracture of shaft of right tibia, subsequent encounter for closed fracture with routine healing: ICD-10-CM

## 2017-07-04 DIAGNOSIS — Z86718 Personal history of other venous thrombosis and embolism: ICD-10-CM

## 2017-07-04 DIAGNOSIS — D509 Iron deficiency anemia, unspecified: ICD-10-CM

## 2017-07-04 DIAGNOSIS — Z86711 Personal history of pulmonary embolism: ICD-10-CM

## 2017-07-04 DIAGNOSIS — N179 Acute kidney failure, unspecified: ICD-10-CM

## 2017-07-04 DIAGNOSIS — R162 Hepatomegaly with splenomegaly, not elsewhere classified: ICD-10-CM

## 2017-07-04 DIAGNOSIS — Z90710 Acquired absence of both cervix and uterus: ICD-10-CM

## 2017-07-04 DIAGNOSIS — Z79899 Other long term (current) drug therapy: ICD-10-CM

## 2017-07-04 DIAGNOSIS — R002 Palpitations: ICD-10-CM

## 2017-07-04 DIAGNOSIS — K5090 Crohn's disease, unspecified, without complications: ICD-10-CM

## 2017-07-04 DIAGNOSIS — K633 Ulcer of intestine: ICD-10-CM

## 2017-07-04 DIAGNOSIS — R42 Dizziness and giddiness: ICD-10-CM

## 2017-07-04 DIAGNOSIS — E1122 Type 2 diabetes mellitus with diabetic chronic kidney disease: ICD-10-CM

## 2017-07-04 DIAGNOSIS — I1310 Hypertensive heart and chronic kidney disease without heart failure, with stage 1 through stage 4 chronic kidney disease, or unspecified chronic kidney disease: ICD-10-CM

## 2017-07-04 DIAGNOSIS — N200 Calculus of kidney: ICD-10-CM

## 2017-07-04 DIAGNOSIS — M797 Fibromyalgia: ICD-10-CM

## 2017-07-04 DIAGNOSIS — R55 Syncope and collapse: Principal | ICD-10-CM

## 2017-07-04 DIAGNOSIS — G43909 Migraine, unspecified, not intractable, without status migrainosus: ICD-10-CM

## 2017-07-04 LAB — POC GLUCOSE
Lab: 278 mg/dL — ABNORMAL HIGH (ref 70–100)
Lab: 191 mg/dL — ABNORMAL HIGH (ref 70–100)

## 2017-07-04 LAB — CBC
Lab: 3.9 M/UL — ABNORMAL LOW (ref 4.0–5.0)
Lab: 6.2 K/UL — ABNORMAL HIGH (ref 4.5–11.0)
Lab: 63 FL — ABNORMAL LOW (ref 80–100)

## 2017-07-04 LAB — COMPREHENSIVE METABOLIC PANEL
Lab: 142 MMOL/L — ABNORMAL LOW (ref 137–147)
Lab: 3.1 MMOL/L — ABNORMAL LOW (ref 3.5–5.1)

## 2017-07-04 LAB — ZINC: Lab: 0.6 MMOL/L — ABNORMAL HIGH (ref 3.5–5.1)

## 2017-07-04 LAB — COPPER: Lab: 0.9 MMOL/L — ABNORMAL LOW (ref >60)

## 2017-07-04 MED ORDER — NEBIVOLOL 10 MG PO TAB
10 mg | ORAL_TABLET | Freq: Every day | ORAL | 0 refills | 60.00000 days | Status: AC
Start: 2017-07-04 — End: ?

## 2017-07-04 MED ORDER — FUROSEMIDE 40 MG PO TAB
40 mg | ORAL_TABLET | Freq: Two times a day (BID) | ORAL | 3 refills | 90.00000 days | Status: AC
Start: 2017-07-04 — End: 2017-08-12
  Filled 2017-07-04 (×2): qty 90, 30d supply, fill #1

## 2017-07-04 MED ORDER — HYDRALAZINE 50 MG PO TAB
50 mg | ORAL_TABLET | Freq: Three times a day (TID) | ORAL | 3 refills | 30.00000 days | Status: AC
Start: 2017-07-04 — End: 2017-08-12
  Filled 2017-07-04 (×2): qty 360, 30d supply, fill #1

## 2017-07-04 MED ORDER — CYANOCOBALAMIN (VITAMIN B-12) 500 MCG PO TAB
1000 ug | ORAL_TABLET | Freq: Every day | ORAL | 1 refills | 29.00000 days | Status: AC
Start: 2017-07-04 — End: 2018-02-11

## 2017-07-07 ENCOUNTER — Encounter: Admit: 2017-07-07 | Discharge: 2017-07-07 | Payer: BC Managed Care – PPO

## 2017-07-08 ENCOUNTER — Encounter: Admit: 2017-07-08 | Discharge: 2017-07-08 | Payer: BC Managed Care – PPO

## 2017-07-08 MED ORDER — INSULIN GLARGINE 100 UNIT/ML (3 ML) SC INJ PEN
11 refills | 60.00000 days | Status: AC
Start: 2017-07-08 — End: 2018-02-11

## 2017-07-11 ENCOUNTER — Encounter: Admit: 2017-07-11 | Discharge: 2017-07-11 | Payer: BC Managed Care – PPO

## 2017-07-24 ENCOUNTER — Encounter: Admit: 2017-07-24 | Discharge: 2017-07-24 | Payer: BC Managed Care – PPO

## 2017-07-24 DIAGNOSIS — IMO0002 Ulcer: ICD-10-CM

## 2017-07-24 DIAGNOSIS — K639 Disease of intestine, unspecified: ICD-10-CM

## 2017-07-24 DIAGNOSIS — K219 Gastro-esophageal reflux disease without esophagitis: ICD-10-CM

## 2017-07-24 DIAGNOSIS — G43909 Migraine, unspecified, not intractable, without status migrainosus: ICD-10-CM

## 2017-07-24 DIAGNOSIS — E139 Other specified diabetes mellitus without complications: ICD-10-CM

## 2017-07-24 DIAGNOSIS — I8290 Acute embolism and thrombosis of unspecified vein: ICD-10-CM

## 2017-07-24 DIAGNOSIS — M359 Systemic involvement of connective tissue, unspecified: ICD-10-CM

## 2017-07-24 DIAGNOSIS — J302 Other seasonal allergic rhinitis: ICD-10-CM

## 2017-07-24 DIAGNOSIS — D4959 Neoplasm of unspecified behavior of other genitourinary organ: ICD-10-CM

## 2017-07-24 DIAGNOSIS — R42 Dizziness and giddiness: ICD-10-CM

## 2017-07-24 DIAGNOSIS — I10 Essential (primary) hypertension: ICD-10-CM

## 2017-07-24 DIAGNOSIS — M797 Fibromyalgia: ICD-10-CM

## 2017-07-24 DIAGNOSIS — D699 Hemorrhagic condition, unspecified: ICD-10-CM

## 2017-07-24 DIAGNOSIS — G40909 Epilepsy, unspecified, not intractable, without status epilepticus: ICD-10-CM

## 2017-07-24 DIAGNOSIS — K7581 Nonalcoholic steatohepatitis (NASH): ICD-10-CM

## 2017-07-24 DIAGNOSIS — K5090 Crohn's disease, unspecified, without complications: Principal | ICD-10-CM

## 2017-08-01 ENCOUNTER — Encounter: Admit: 2017-08-01 | Discharge: 2017-08-01 | Payer: BC Managed Care – PPO

## 2017-08-05 ENCOUNTER — Encounter: Admit: 2017-08-05 | Discharge: 2017-08-05 | Payer: BC Managed Care – PPO

## 2017-08-11 ENCOUNTER — Ambulatory Visit: Admit: 2017-08-11 | Discharge: 2017-08-12 | Payer: BC Managed Care – PPO

## 2017-08-12 ENCOUNTER — Ambulatory Visit: Admit: 2017-08-12 | Discharge: 2017-08-12 | Payer: BC Managed Care – PPO

## 2017-08-12 ENCOUNTER — Encounter: Admit: 2017-08-12 | Discharge: 2017-08-12 | Payer: BC Managed Care – PPO

## 2017-08-12 DIAGNOSIS — R42 Dizziness and giddiness: Principal | ICD-10-CM

## 2017-08-12 DIAGNOSIS — G40909 Epilepsy, unspecified, not intractable, without status epilepticus: ICD-10-CM

## 2017-08-12 DIAGNOSIS — IMO0002 Ulcer: ICD-10-CM

## 2017-08-12 DIAGNOSIS — J302 Other seasonal allergic rhinitis: ICD-10-CM

## 2017-08-12 DIAGNOSIS — Z794 Long term (current) use of insulin: ICD-10-CM

## 2017-08-12 DIAGNOSIS — I10 Essential (primary) hypertension: ICD-10-CM

## 2017-08-12 DIAGNOSIS — K639 Disease of intestine, unspecified: ICD-10-CM

## 2017-08-12 DIAGNOSIS — N183 Chronic kidney disease, stage 3 (moderate): ICD-10-CM

## 2017-08-12 DIAGNOSIS — R55 Syncope and collapse: Principal | ICD-10-CM

## 2017-08-12 DIAGNOSIS — E1165 Type 2 diabetes mellitus with hyperglycemia: ICD-10-CM

## 2017-08-12 DIAGNOSIS — D699 Hemorrhagic condition, unspecified: ICD-10-CM

## 2017-08-12 DIAGNOSIS — K5090 Crohn's disease, unspecified, without complications: Principal | ICD-10-CM

## 2017-08-12 DIAGNOSIS — K7581 Nonalcoholic steatohepatitis (NASH): ICD-10-CM

## 2017-08-12 DIAGNOSIS — D4959 Neoplasm of unspecified behavior of other genitourinary organ: ICD-10-CM

## 2017-08-12 DIAGNOSIS — G43909 Migraine, unspecified, not intractable, without status migrainosus: ICD-10-CM

## 2017-08-12 DIAGNOSIS — K219 Gastro-esophageal reflux disease without esophagitis: ICD-10-CM

## 2017-08-12 DIAGNOSIS — M797 Fibromyalgia: ICD-10-CM

## 2017-08-12 DIAGNOSIS — M359 Systemic involvement of connective tissue, unspecified: ICD-10-CM

## 2017-08-12 DIAGNOSIS — E139 Other specified diabetes mellitus without complications: ICD-10-CM

## 2017-08-12 DIAGNOSIS — I8290 Acute embolism and thrombosis of unspecified vein: ICD-10-CM

## 2017-08-12 LAB — LIPID PROFILE
Lab: 163 mg/dL — ABNORMAL HIGH (ref <100)
Lab: 230 mg/dL — ABNORMAL HIGH (ref <200)
Lab: 301 mg/dL — ABNORMAL HIGH (ref <150)
Lab: 41 mg/dL (ref >40)
Lab: 60 mg/dL (ref 8.5–10.6)

## 2017-08-12 LAB — COMPREHENSIVE METABOLIC PANEL
Lab: 10 (ref 3–12)
Lab: 139 MMOL/L (ref 137–147)
Lab: 16 U/L (ref 7–40)
Lab: 3.7 MMOL/L (ref 3.5–5.1)
Lab: 32 U/L (ref 7–56)
Lab: 34 mL/min — ABNORMAL LOW (ref >60)
Lab: 41 mL/min — ABNORMAL LOW (ref >60)

## 2017-08-12 LAB — MAGNESIUM: Lab: 2 mg/dL (ref 1.6–2.6)

## 2017-08-12 LAB — CBC
Lab: 12 10*3/uL — ABNORMAL HIGH (ref 4.5–11.0)
Lab: 4.5 M/UL (ref 4.0–5.0)

## 2017-08-12 MED ORDER — FUROSEMIDE 40 MG PO TAB
40 mg | ORAL_TABLET | Freq: Every morning | ORAL | 3 refills | 90.00000 days | Status: AC
Start: 2017-08-12 — End: 2017-12-31

## 2017-08-12 MED ORDER — HYDRALAZINE 50 MG PO TAB
25 mg | ORAL_TABLET | Freq: Three times a day (TID) | ORAL | 3 refills | 30.00000 days | Status: AC
Start: 2017-08-12 — End: 2018-11-16

## 2017-08-14 ENCOUNTER — Encounter: Admit: 2017-08-14 | Discharge: 2017-08-14 | Payer: BC Managed Care – PPO

## 2017-09-09 ENCOUNTER — Ambulatory Visit: Admit: 2017-09-09 | Discharge: 2017-09-09 | Payer: BC Managed Care – PPO

## 2017-09-09 ENCOUNTER — Encounter: Admit: 2017-09-09 | Discharge: 2017-09-09 | Payer: BC Managed Care – PPO

## 2017-09-09 DIAGNOSIS — N183 Chronic kidney disease, stage 3 (moderate): ICD-10-CM

## 2017-09-09 DIAGNOSIS — K50018 Crohn's disease of small intestine with other complication: ICD-10-CM

## 2017-09-09 DIAGNOSIS — R42 Dizziness and giddiness: ICD-10-CM

## 2017-09-09 DIAGNOSIS — K639 Disease of intestine, unspecified: ICD-10-CM

## 2017-09-09 DIAGNOSIS — I10 Essential (primary) hypertension: ICD-10-CM

## 2017-09-09 DIAGNOSIS — R945 Abnormal results of liver function studies: ICD-10-CM

## 2017-09-09 DIAGNOSIS — M359 Systemic involvement of connective tissue, unspecified: ICD-10-CM

## 2017-09-09 DIAGNOSIS — M797 Fibromyalgia: ICD-10-CM

## 2017-09-09 DIAGNOSIS — R791 Abnormal coagulation profile: ICD-10-CM

## 2017-09-09 DIAGNOSIS — K5090 Crohn's disease, unspecified, without complications: Principal | ICD-10-CM

## 2017-09-09 DIAGNOSIS — IMO0002 Ulcer: ICD-10-CM

## 2017-09-09 DIAGNOSIS — R0602 Shortness of breath: ICD-10-CM

## 2017-09-09 DIAGNOSIS — A310 Pulmonary mycobacterial infection: ICD-10-CM

## 2017-09-09 DIAGNOSIS — I8290 Acute embolism and thrombosis of unspecified vein: ICD-10-CM

## 2017-09-09 DIAGNOSIS — G43909 Migraine, unspecified, not intractable, without status migrainosus: ICD-10-CM

## 2017-09-09 DIAGNOSIS — G4720 Circadian rhythm sleep disorder, unspecified type: ICD-10-CM

## 2017-09-09 DIAGNOSIS — E139 Other specified diabetes mellitus without complications: ICD-10-CM

## 2017-09-09 DIAGNOSIS — G40909 Epilepsy, unspecified, not intractable, without status epilepticus: ICD-10-CM

## 2017-09-09 DIAGNOSIS — D699 Hemorrhagic condition, unspecified: ICD-10-CM

## 2017-09-09 DIAGNOSIS — J302 Other seasonal allergic rhinitis: ICD-10-CM

## 2017-09-09 DIAGNOSIS — R0902 Hypoxemia: ICD-10-CM

## 2017-09-09 DIAGNOSIS — D4959 Neoplasm of unspecified behavior of other genitourinary organ: ICD-10-CM

## 2017-09-09 DIAGNOSIS — K219 Gastro-esophageal reflux disease without esophagitis: ICD-10-CM

## 2017-09-09 DIAGNOSIS — K7581 Nonalcoholic steatohepatitis (NASH): ICD-10-CM

## 2017-09-09 LAB — COMPREHENSIVE METABOLIC PANEL
Lab: 0.5 mg/dL — ABNORMAL HIGH (ref 0.3–1.2)
Lab: 1.3 mg/dL — ABNORMAL HIGH (ref 0.4–1.00)
Lab: 105 U/L (ref 25–110)
Lab: 11 10*3/uL (ref 3–12)
Lab: 136 MMOL/L — ABNORMAL LOW (ref 137–147)
Lab: 18 U/L (ref 7–40)
Lab: 21 mg/dL (ref 7–25)
Lab: 217 mg/dL — ABNORMAL HIGH (ref 70–100)
Lab: 27 MMOL/L (ref 21–30)
Lab: 3.8 MMOL/L (ref 3.5–5.1)
Lab: 35 U/L — ABNORMAL HIGH (ref 7–56)
Lab: 4.5 g/dL — ABNORMAL LOW (ref 3.5–5.0)
Lab: 43 mL/min — ABNORMAL LOW (ref >60)
Lab: 52 mL/min — ABNORMAL LOW (ref >60)
Lab: 7.2 g/dL (ref 6.0–8.0)
Lab: 9.9 mg/dL (ref 8.5–10.6)
Lab: 98 MMOL/L (ref 98–110)

## 2017-09-09 LAB — CBC AND DIFF
Lab: 0 10*3/uL (ref 0–0.20)
Lab: 13 10*3/uL — ABNORMAL HIGH (ref 4.5–11.0)
Lab: 4.7 M/UL (ref 4.0–5.0)

## 2017-09-09 MED ORDER — ROSUVASTATIN 20 MG PO TAB
20 mg | ORAL_TABLET | Freq: Every day | ORAL | 3 refills | 90.00000 days | Status: AC
Start: 2017-09-09 — End: 2017-12-05

## 2017-09-22 ENCOUNTER — Encounter: Admit: 2017-09-22 | Discharge: 2017-09-22 | Payer: BC Managed Care – PPO

## 2017-09-22 DIAGNOSIS — K50018 Crohn's disease of small intestine with other complication: Principal | ICD-10-CM

## 2017-09-22 MED ORDER — BUDESONIDE 3 MG PO CECX
ORAL_CAPSULE | ORAL | 1 refills | 30.00000 days | Status: AC
Start: 2017-09-22 — End: 2018-04-17

## 2017-09-30 ENCOUNTER — Ambulatory Visit: Admit: 2017-09-30 | Discharge: 2017-09-30 | Payer: BC Managed Care – PPO

## 2017-09-30 ENCOUNTER — Encounter: Admit: 2017-09-30 | Discharge: 2017-09-30 | Payer: BC Managed Care – PPO

## 2017-09-30 DIAGNOSIS — K7581 Nonalcoholic steatohepatitis (NASH): ICD-10-CM

## 2017-09-30 DIAGNOSIS — D500 Iron deficiency anemia secondary to blood loss (chronic): ICD-10-CM

## 2017-09-30 DIAGNOSIS — K639 Disease of intestine, unspecified: ICD-10-CM

## 2017-09-30 DIAGNOSIS — K50018 Crohn's disease of small intestine with other complication: ICD-10-CM

## 2017-09-30 DIAGNOSIS — F3289 Other specified depressive episodes: ICD-10-CM

## 2017-09-30 DIAGNOSIS — G40909 Epilepsy, unspecified, not intractable, without status epilepticus: ICD-10-CM

## 2017-09-30 DIAGNOSIS — Z1231 Encounter for screening mammogram for malignant neoplasm of breast: ICD-10-CM

## 2017-09-30 DIAGNOSIS — E119 Type 2 diabetes mellitus without complications: ICD-10-CM

## 2017-09-30 DIAGNOSIS — M797 Fibromyalgia: ICD-10-CM

## 2017-09-30 DIAGNOSIS — S30811A Abrasion of abdominal wall, initial encounter: ICD-10-CM

## 2017-09-30 DIAGNOSIS — R42 Dizziness and giddiness: ICD-10-CM

## 2017-09-30 DIAGNOSIS — E139 Other specified diabetes mellitus without complications: ICD-10-CM

## 2017-09-30 DIAGNOSIS — IMO0002 Ulcer: ICD-10-CM

## 2017-09-30 DIAGNOSIS — I8290 Acute embolism and thrombosis of unspecified vein: ICD-10-CM

## 2017-09-30 DIAGNOSIS — D699 Hemorrhagic condition, unspecified: ICD-10-CM

## 2017-09-30 DIAGNOSIS — J302 Other seasonal allergic rhinitis: ICD-10-CM

## 2017-09-30 DIAGNOSIS — I10 Essential (primary) hypertension: ICD-10-CM

## 2017-09-30 DIAGNOSIS — R600 Localized edema: ICD-10-CM

## 2017-09-30 DIAGNOSIS — G43909 Migraine, unspecified, not intractable, without status migrainosus: ICD-10-CM

## 2017-09-30 DIAGNOSIS — K219 Gastro-esophageal reflux disease without esophagitis: ICD-10-CM

## 2017-09-30 DIAGNOSIS — D4959 Neoplasm of unspecified behavior of other genitourinary organ: ICD-10-CM

## 2017-09-30 DIAGNOSIS — K5090 Crohn's disease, unspecified, without complications: Principal | ICD-10-CM

## 2017-09-30 DIAGNOSIS — M359 Systemic involvement of connective tissue, unspecified: ICD-10-CM

## 2017-09-30 DIAGNOSIS — Z23 Encounter for immunization: Principal | ICD-10-CM

## 2017-09-30 DIAGNOSIS — A310 Pulmonary mycobacterial infection: ICD-10-CM

## 2017-10-01 ENCOUNTER — Encounter: Admit: 2017-10-01 | Discharge: 2017-10-01 | Payer: BC Managed Care – PPO

## 2017-10-01 DIAGNOSIS — D699 Hemorrhagic condition, unspecified: ICD-10-CM

## 2017-10-01 DIAGNOSIS — IMO0002 Ulcer: ICD-10-CM

## 2017-10-01 DIAGNOSIS — K5090 Crohn's disease, unspecified, without complications: Principal | ICD-10-CM

## 2017-10-01 DIAGNOSIS — G43909 Migraine, unspecified, not intractable, without status migrainosus: ICD-10-CM

## 2017-10-01 DIAGNOSIS — K7581 Nonalcoholic steatohepatitis (NASH): ICD-10-CM

## 2017-10-01 DIAGNOSIS — G40909 Epilepsy, unspecified, not intractable, without status epilepticus: ICD-10-CM

## 2017-10-01 DIAGNOSIS — I8290 Acute embolism and thrombosis of unspecified vein: ICD-10-CM

## 2017-10-01 DIAGNOSIS — M359 Systemic involvement of connective tissue, unspecified: ICD-10-CM

## 2017-10-01 DIAGNOSIS — J302 Other seasonal allergic rhinitis: ICD-10-CM

## 2017-10-01 DIAGNOSIS — D4959 Neoplasm of unspecified behavior of other genitourinary organ: ICD-10-CM

## 2017-10-01 DIAGNOSIS — I10 Essential (primary) hypertension: ICD-10-CM

## 2017-10-01 DIAGNOSIS — M797 Fibromyalgia: ICD-10-CM

## 2017-10-01 DIAGNOSIS — E139 Other specified diabetes mellitus without complications: ICD-10-CM

## 2017-10-01 DIAGNOSIS — K639 Disease of intestine, unspecified: ICD-10-CM

## 2017-10-01 DIAGNOSIS — K219 Gastro-esophageal reflux disease without esophagitis: ICD-10-CM

## 2017-10-01 DIAGNOSIS — R42 Dizziness and giddiness: ICD-10-CM

## 2017-10-02 DIAGNOSIS — G4733 Obstructive sleep apnea (adult) (pediatric): Principal | ICD-10-CM

## 2017-10-02 DIAGNOSIS — R400 Somnolence: ICD-10-CM

## 2017-10-02 DIAGNOSIS — R5383 Other fatigue: ICD-10-CM

## 2017-10-03 ENCOUNTER — Ambulatory Visit: Admit: 2017-10-03 | Discharge: 2017-10-02 | Payer: BC Managed Care – PPO

## 2017-10-15 ENCOUNTER — Encounter: Admit: 2017-10-15 | Discharge: 2017-10-15 | Payer: BC Managed Care – PPO

## 2017-10-15 ENCOUNTER — Ambulatory Visit: Admit: 2017-10-15 | Discharge: 2017-10-16 | Payer: BC Managed Care – PPO

## 2017-10-15 DIAGNOSIS — G40909 Epilepsy, unspecified, not intractable, without status epilepticus: ICD-10-CM

## 2017-10-15 DIAGNOSIS — R42 Dizziness and giddiness: ICD-10-CM

## 2017-10-15 DIAGNOSIS — D4959 Neoplasm of unspecified behavior of other genitourinary organ: ICD-10-CM

## 2017-10-15 DIAGNOSIS — K639 Disease of intestine, unspecified: ICD-10-CM

## 2017-10-15 DIAGNOSIS — I10 Essential (primary) hypertension: ICD-10-CM

## 2017-10-15 DIAGNOSIS — K219 Gastro-esophageal reflux disease without esophagitis: ICD-10-CM

## 2017-10-15 DIAGNOSIS — E139 Other specified diabetes mellitus without complications: ICD-10-CM

## 2017-10-15 DIAGNOSIS — M359 Systemic involvement of connective tissue, unspecified: ICD-10-CM

## 2017-10-15 DIAGNOSIS — G43909 Migraine, unspecified, not intractable, without status migrainosus: ICD-10-CM

## 2017-10-15 DIAGNOSIS — I8290 Acute embolism and thrombosis of unspecified vein: ICD-10-CM

## 2017-10-15 DIAGNOSIS — K5090 Crohn's disease, unspecified, without complications: Principal | ICD-10-CM

## 2017-10-15 DIAGNOSIS — M797 Fibromyalgia: ICD-10-CM

## 2017-10-15 DIAGNOSIS — K7581 Nonalcoholic steatohepatitis (NASH): ICD-10-CM

## 2017-10-15 DIAGNOSIS — J302 Other seasonal allergic rhinitis: ICD-10-CM

## 2017-10-15 DIAGNOSIS — D699 Hemorrhagic condition, unspecified: ICD-10-CM

## 2017-10-15 DIAGNOSIS — IMO0002 Ulcer: ICD-10-CM

## 2017-10-16 ENCOUNTER — Ambulatory Visit: Admit: 2017-10-16 | Discharge: 2017-10-16 | Payer: BC Managed Care – PPO

## 2017-10-16 ENCOUNTER — Encounter: Admit: 2017-10-16 | Discharge: 2017-10-16 | Payer: BC Managed Care – PPO

## 2017-10-16 DIAGNOSIS — N644 Mastodynia: Principal | ICD-10-CM

## 2017-10-16 DIAGNOSIS — R600 Localized edema: Principal | ICD-10-CM

## 2017-10-16 DIAGNOSIS — G4733 Obstructive sleep apnea (adult) (pediatric): ICD-10-CM

## 2017-10-16 DIAGNOSIS — N63 Unspecified lump in unspecified breast: ICD-10-CM

## 2017-10-16 DIAGNOSIS — D500 Iron deficiency anemia secondary to blood loss (chronic): ICD-10-CM

## 2017-10-16 DIAGNOSIS — Z1231 Encounter for screening mammogram for malignant neoplasm of breast: ICD-10-CM

## 2017-10-16 DIAGNOSIS — M7989 Other specified soft tissue disorders: ICD-10-CM

## 2017-10-16 DIAGNOSIS — I10 Essential (primary) hypertension: Secondary | ICD-10-CM

## 2017-10-16 DIAGNOSIS — I890 Lymphedema, not elsewhere classified: Secondary | ICD-10-CM

## 2017-10-16 DIAGNOSIS — N183 Chronic kidney disease, stage 3 (moderate): ICD-10-CM

## 2017-10-16 DIAGNOSIS — E7800 Pure hypercholesterolemia, unspecified: Principal | ICD-10-CM

## 2017-10-20 ENCOUNTER — Encounter: Admit: 2017-10-20 | Discharge: 2017-10-20 | Payer: BC Managed Care – PPO

## 2017-10-20 ENCOUNTER — Ambulatory Visit: Admit: 2017-10-20 | Discharge: 2017-10-21 | Payer: BC Managed Care – PPO

## 2017-10-20 DIAGNOSIS — E139 Other specified diabetes mellitus without complications: ICD-10-CM

## 2017-10-20 DIAGNOSIS — I8290 Acute embolism and thrombosis of unspecified vein: ICD-10-CM

## 2017-10-20 DIAGNOSIS — IMO0002 Ulcer: ICD-10-CM

## 2017-10-20 DIAGNOSIS — M797 Fibromyalgia: ICD-10-CM

## 2017-10-20 DIAGNOSIS — J302 Other seasonal allergic rhinitis: ICD-10-CM

## 2017-10-20 DIAGNOSIS — K639 Disease of intestine, unspecified: ICD-10-CM

## 2017-10-20 DIAGNOSIS — G40909 Epilepsy, unspecified, not intractable, without status epilepticus: ICD-10-CM

## 2017-10-20 DIAGNOSIS — K5090 Crohn's disease, unspecified, without complications: Principal | ICD-10-CM

## 2017-10-20 DIAGNOSIS — D4959 Neoplasm of unspecified behavior of other genitourinary organ: ICD-10-CM

## 2017-10-20 DIAGNOSIS — R42 Dizziness and giddiness: ICD-10-CM

## 2017-10-20 DIAGNOSIS — K219 Gastro-esophageal reflux disease without esophagitis: ICD-10-CM

## 2017-10-20 DIAGNOSIS — D699 Hemorrhagic condition, unspecified: ICD-10-CM

## 2017-10-20 DIAGNOSIS — M359 Systemic involvement of connective tissue, unspecified: ICD-10-CM

## 2017-10-20 DIAGNOSIS — G43909 Migraine, unspecified, not intractable, without status migrainosus: ICD-10-CM

## 2017-10-20 DIAGNOSIS — I10 Essential (primary) hypertension: ICD-10-CM

## 2017-10-20 DIAGNOSIS — K7581 Nonalcoholic steatohepatitis (NASH): ICD-10-CM

## 2017-10-21 DIAGNOSIS — G4733 Obstructive sleep apnea (adult) (pediatric): Principal | ICD-10-CM

## 2017-10-22 ENCOUNTER — Encounter: Admit: 2017-10-22 | Discharge: 2017-10-22 | Payer: BC Managed Care – PPO

## 2017-10-22 DIAGNOSIS — G40909 Epilepsy, unspecified, not intractable, without status epilepticus: ICD-10-CM

## 2017-10-22 DIAGNOSIS — IMO0002 Ulcer: ICD-10-CM

## 2017-10-22 DIAGNOSIS — M359 Systemic involvement of connective tissue, unspecified: ICD-10-CM

## 2017-10-22 DIAGNOSIS — K7581 Nonalcoholic steatohepatitis (NASH): ICD-10-CM

## 2017-10-22 DIAGNOSIS — D4959 Neoplasm of unspecified behavior of other genitourinary organ: ICD-10-CM

## 2017-10-22 DIAGNOSIS — M797 Fibromyalgia: ICD-10-CM

## 2017-10-22 DIAGNOSIS — E119 Type 2 diabetes mellitus without complications: ICD-10-CM

## 2017-10-22 DIAGNOSIS — I10 Essential (primary) hypertension: ICD-10-CM

## 2017-10-22 DIAGNOSIS — E139 Other specified diabetes mellitus without complications: ICD-10-CM

## 2017-10-22 DIAGNOSIS — K5090 Crohn's disease, unspecified, without complications: Principal | ICD-10-CM

## 2017-10-22 DIAGNOSIS — G43909 Migraine, unspecified, not intractable, without status migrainosus: ICD-10-CM

## 2017-10-22 DIAGNOSIS — K639 Disease of intestine, unspecified: ICD-10-CM

## 2017-10-22 DIAGNOSIS — I87009 Postthrombotic syndrome without complications of unspecified extremity: Principal | ICD-10-CM

## 2017-10-22 DIAGNOSIS — D699 Hemorrhagic condition, unspecified: ICD-10-CM

## 2017-10-22 DIAGNOSIS — K219 Gastro-esophageal reflux disease without esophagitis: ICD-10-CM

## 2017-10-22 DIAGNOSIS — J302 Other seasonal allergic rhinitis: ICD-10-CM

## 2017-10-22 DIAGNOSIS — I8290 Acute embolism and thrombosis of unspecified vein: ICD-10-CM

## 2017-10-22 DIAGNOSIS — R42 Dizziness and giddiness: ICD-10-CM

## 2017-10-29 ENCOUNTER — Encounter: Admit: 2017-10-29 | Discharge: 2017-10-29 | Payer: BC Managed Care – PPO

## 2017-10-29 DIAGNOSIS — G4733 Obstructive sleep apnea (adult) (pediatric): Principal | ICD-10-CM

## 2017-10-31 ENCOUNTER — Encounter: Admit: 2017-10-31 | Discharge: 2017-10-31 | Payer: BC Managed Care – PPO

## 2017-11-03 ENCOUNTER — Ambulatory Visit: Admit: 2017-11-03 | Discharge: 2017-11-04 | Payer: BC Managed Care – PPO

## 2017-11-03 DIAGNOSIS — E1165 Type 2 diabetes mellitus with hyperglycemia: Principal | ICD-10-CM

## 2017-11-03 MED ORDER — DEXCOM G6 TRANSMITTER MISC DEVI
1 | 6 refills | Status: AC
Start: 2017-11-03 — End: ?

## 2017-11-03 MED ORDER — DEXCOM G6 RECEIVER MISC MISC
1 | Freq: Every day | 1 refills | Status: AC
Start: 2017-11-03 — End: ?

## 2017-11-03 MED ORDER — INSULIN DEGLUDEC 200 UNIT/ML (3 ML) SC INPN
3 refills | 60.00000 days | Status: AC
Start: 2017-11-03 — End: 2018-07-24

## 2017-11-03 MED ORDER — BLOOD-GLUCOSE SENSOR MISC DEVI
1 | 4 refills | 50.00000 days | Status: AC
Start: 2017-11-03 — End: ?

## 2017-11-04 ENCOUNTER — Encounter: Admit: 2017-11-04 | Discharge: 2017-11-04 | Payer: BC Managed Care – PPO

## 2017-11-04 DIAGNOSIS — Z794 Long term (current) use of insulin: Secondary | ICD-10-CM

## 2017-11-04 DIAGNOSIS — S92901G Unspecified fracture of right foot, subsequent encounter for fracture with delayed healing: ICD-10-CM

## 2017-11-05 ENCOUNTER — Encounter: Admit: 2017-11-05 | Discharge: 2017-11-05 | Payer: BC Managed Care – PPO

## 2017-11-11 ENCOUNTER — Encounter: Admit: 2017-11-11 | Discharge: 2017-11-11 | Payer: BC Managed Care – PPO

## 2017-11-12 ENCOUNTER — Encounter: Admit: 2017-11-12 | Discharge: 2017-11-12 | Payer: BC Managed Care – PPO

## 2017-11-25 ENCOUNTER — Encounter: Admit: 2017-11-25 | Discharge: 2017-11-25 | Payer: BC Managed Care – PPO

## 2017-11-27 ENCOUNTER — Encounter: Admit: 2017-11-27 | Discharge: 2017-11-27 | Payer: BC Managed Care – PPO

## 2017-11-27 ENCOUNTER — Ambulatory Visit: Admit: 2017-11-27 | Discharge: 2017-11-28 | Payer: BC Managed Care – PPO

## 2017-11-27 DIAGNOSIS — K639 Disease of intestine, unspecified: ICD-10-CM

## 2017-11-27 DIAGNOSIS — M359 Systemic involvement of connective tissue, unspecified: ICD-10-CM

## 2017-11-27 DIAGNOSIS — E119 Type 2 diabetes mellitus without complications: ICD-10-CM

## 2017-11-27 DIAGNOSIS — K7581 Nonalcoholic steatohepatitis (NASH): ICD-10-CM

## 2017-11-27 DIAGNOSIS — J302 Other seasonal allergic rhinitis: ICD-10-CM

## 2017-11-27 DIAGNOSIS — K219 Gastro-esophageal reflux disease without esophagitis: ICD-10-CM

## 2017-11-27 DIAGNOSIS — I8290 Acute embolism and thrombosis of unspecified vein: ICD-10-CM

## 2017-11-27 DIAGNOSIS — I10 Essential (primary) hypertension: ICD-10-CM

## 2017-11-27 DIAGNOSIS — IMO0002 Ulcer: ICD-10-CM

## 2017-11-27 DIAGNOSIS — G40909 Epilepsy, unspecified, not intractable, without status epilepticus: ICD-10-CM

## 2017-11-27 DIAGNOSIS — D4959 Neoplasm of unspecified behavior of other genitourinary organ: ICD-10-CM

## 2017-11-27 DIAGNOSIS — D699 Hemorrhagic condition, unspecified: ICD-10-CM

## 2017-11-27 DIAGNOSIS — G43909 Migraine, unspecified, not intractable, without status migrainosus: ICD-10-CM

## 2017-11-27 DIAGNOSIS — K5090 Crohn's disease, unspecified, without complications: Principal | ICD-10-CM

## 2017-11-27 DIAGNOSIS — E139 Other specified diabetes mellitus without complications: ICD-10-CM

## 2017-11-27 DIAGNOSIS — R42 Dizziness and giddiness: ICD-10-CM

## 2017-11-27 DIAGNOSIS — M797 Fibromyalgia: ICD-10-CM

## 2017-11-28 DIAGNOSIS — I82B12 Acute embolism and thrombosis of left subclavian vein: ICD-10-CM

## 2017-11-28 DIAGNOSIS — K5000 Crohn's disease of small intestine without complications: ICD-10-CM

## 2017-11-28 DIAGNOSIS — I87009 Postthrombotic syndrome without complications of unspecified extremity: ICD-10-CM

## 2017-11-28 DIAGNOSIS — R601 Generalized edema: Principal | ICD-10-CM

## 2017-11-30 ENCOUNTER — Encounter: Admit: 2017-11-30 | Discharge: 2017-11-30 | Payer: BC Managed Care – PPO

## 2017-11-30 DIAGNOSIS — G43909 Migraine, unspecified, not intractable, without status migrainosus: ICD-10-CM

## 2017-11-30 DIAGNOSIS — I8290 Acute embolism and thrombosis of unspecified vein: ICD-10-CM

## 2017-11-30 DIAGNOSIS — K219 Gastro-esophageal reflux disease without esophagitis: ICD-10-CM

## 2017-11-30 DIAGNOSIS — G40909 Epilepsy, unspecified, not intractable, without status epilepticus: ICD-10-CM

## 2017-11-30 DIAGNOSIS — J302 Other seasonal allergic rhinitis: ICD-10-CM

## 2017-11-30 DIAGNOSIS — E139 Other specified diabetes mellitus without complications: ICD-10-CM

## 2017-11-30 DIAGNOSIS — D699 Hemorrhagic condition, unspecified: ICD-10-CM

## 2017-11-30 DIAGNOSIS — IMO0002 Ulcer: ICD-10-CM

## 2017-11-30 DIAGNOSIS — K5090 Crohn's disease, unspecified, without complications: Principal | ICD-10-CM

## 2017-11-30 DIAGNOSIS — E119 Type 2 diabetes mellitus without complications: ICD-10-CM

## 2017-11-30 DIAGNOSIS — M797 Fibromyalgia: ICD-10-CM

## 2017-11-30 DIAGNOSIS — R42 Dizziness and giddiness: ICD-10-CM

## 2017-11-30 DIAGNOSIS — K639 Disease of intestine, unspecified: ICD-10-CM

## 2017-11-30 DIAGNOSIS — K7581 Nonalcoholic steatohepatitis (NASH): ICD-10-CM

## 2017-11-30 DIAGNOSIS — D4959 Neoplasm of unspecified behavior of other genitourinary organ: ICD-10-CM

## 2017-11-30 DIAGNOSIS — M359 Systemic involvement of connective tissue, unspecified: ICD-10-CM

## 2017-11-30 DIAGNOSIS — I10 Essential (primary) hypertension: ICD-10-CM

## 2017-12-01 ENCOUNTER — Encounter: Admit: 2017-12-01 | Discharge: 2017-12-01 | Payer: BC Managed Care – PPO

## 2017-12-01 DIAGNOSIS — E7800 Pure hypercholesterolemia, unspecified: ICD-10-CM

## 2017-12-01 DIAGNOSIS — I10 Essential (primary) hypertension: Principal | ICD-10-CM

## 2017-12-02 ENCOUNTER — Encounter: Admit: 2017-12-02 | Discharge: 2017-12-02 | Payer: BC Managed Care – PPO

## 2017-12-02 DIAGNOSIS — N183 Chronic kidney disease, stage 3 (moderate): ICD-10-CM

## 2017-12-02 DIAGNOSIS — E7800 Pure hypercholesterolemia, unspecified: Principal | ICD-10-CM

## 2017-12-02 DIAGNOSIS — E119 Type 2 diabetes mellitus without complications: ICD-10-CM

## 2017-12-02 MED ORDER — ATORVASTATIN 40 MG PO TAB
40 mg | ORAL_TABLET | Freq: Every day | ORAL | 3 refills | Status: AC
Start: 2017-12-02 — End: 2019-03-17

## 2017-12-02 MED ORDER — ATORVASTATIN 40 MG PO TAB
40 mg | ORAL_TABLET | Freq: Every day | ORAL | 3 refills | Status: AC
Start: 2017-12-02 — End: 2017-12-02

## 2017-12-12 ENCOUNTER — Encounter: Admit: 2017-12-12 | Discharge: 2017-12-12 | Payer: BC Managed Care – PPO

## 2017-12-15 ENCOUNTER — Encounter: Admit: 2017-12-15 | Discharge: 2017-12-15 | Payer: BC Managed Care – PPO

## 2017-12-16 ENCOUNTER — Encounter: Admit: 2017-12-16 | Discharge: 2017-12-16 | Payer: BC Managed Care – PPO

## 2017-12-16 DIAGNOSIS — Z1231 Encounter for screening mammogram for malignant neoplasm of breast: Principal | ICD-10-CM

## 2017-12-19 ENCOUNTER — Ambulatory Visit: Admit: 2017-12-19 | Discharge: 2017-12-19 | Payer: BC Managed Care – PPO

## 2017-12-19 ENCOUNTER — Ambulatory Visit: Admit: 2017-12-19 | Discharge: 2017-12-19

## 2017-12-19 DIAGNOSIS — E7800 Pure hypercholesterolemia, unspecified: Secondary | ICD-10-CM

## 2017-12-19 DIAGNOSIS — I10 Essential (primary) hypertension: ICD-10-CM

## 2017-12-19 DIAGNOSIS — I82B12 Acute embolism and thrombosis of left subclavian vein: ICD-10-CM

## 2017-12-19 DIAGNOSIS — R601 Generalized edema: Principal | ICD-10-CM

## 2017-12-19 DIAGNOSIS — K5000 Crohn's disease of small intestine without complications: ICD-10-CM

## 2017-12-19 DIAGNOSIS — N63 Unspecified lump in unspecified breast: ICD-10-CM

## 2017-12-19 DIAGNOSIS — N644 Mastodynia: ICD-10-CM

## 2017-12-19 LAB — COMPREHENSIVE METABOLIC PANEL
Lab: 0.4 mg/dL (ref 0.3–1.2)
Lab: 1.2 mg/dL — ABNORMAL HIGH (ref 0.4–1.00)
Lab: 133 MMOL/L — ABNORMAL LOW (ref 137–147)
Lab: 241 mg/dL — ABNORMAL HIGH (ref 70–100)
Lab: 25 U/L (ref 7–40)
Lab: 26 mg/dL — ABNORMAL HIGH (ref 7–25)
Lab: 4.3 g/dL (ref 3.5–5.0)
Lab: 4.6 MMOL/L (ref 3.5–5.1)
Lab: 7.5 g/dL (ref 6.0–8.0)
Lab: 9.7 mg/dL (ref 8.5–10.6)
Lab: 99 MMOL/L (ref 98–110)

## 2017-12-19 LAB — LIPID PROFILE
Lab: 120 mg/dL
Lab: 162 mg/dL (ref <200)
Lab: 283 mg/dL — ABNORMAL HIGH (ref <150)
Lab: 42 mg/dL (ref >40)
Lab: 57 mg/dL
Lab: 93 mg/dL (ref <100)

## 2017-12-19 LAB — POC CREATININE, RAD: Lab: 1.2 mg/dL — ABNORMAL HIGH (ref 0.4–1.00)

## 2017-12-19 LAB — CREATINE KINASE-CPK: Lab: 54 U/L (ref 21–215)

## 2017-12-19 MED ORDER — IOHEXOL 350 MG IODINE/ML IV SOLN
80 mL | Freq: Once | INTRAVENOUS | 0 refills | Status: CP
Start: 2017-12-19 — End: ?
  Administered 2017-12-19: 18:00:00 80 mL via INTRAVENOUS

## 2017-12-19 MED ORDER — SODIUM CHLORIDE 0.9 % IJ SOLN
50 mL | Freq: Once | INTRAVENOUS | 0 refills | Status: CP
Start: 2017-12-19 — End: ?
  Administered 2017-12-19: 18:00:00 50 mL via INTRAVENOUS

## 2017-12-31 ENCOUNTER — Ambulatory Visit: Admit: 2017-12-31 | Discharge: 2018-01-01 | Payer: BC Managed Care – PPO

## 2017-12-31 ENCOUNTER — Encounter: Admit: 2017-12-31 | Discharge: 2017-12-31 | Payer: BC Managed Care – PPO

## 2017-12-31 DIAGNOSIS — E139 Other specified diabetes mellitus without complications: ICD-10-CM

## 2017-12-31 DIAGNOSIS — I87009 Postthrombotic syndrome without complications of unspecified extremity: Principal | ICD-10-CM

## 2017-12-31 DIAGNOSIS — R42 Dizziness and giddiness: ICD-10-CM

## 2017-12-31 DIAGNOSIS — E119 Type 2 diabetes mellitus without complications: ICD-10-CM

## 2017-12-31 DIAGNOSIS — M797 Fibromyalgia: ICD-10-CM

## 2017-12-31 DIAGNOSIS — K7581 Nonalcoholic steatohepatitis (NASH): ICD-10-CM

## 2017-12-31 DIAGNOSIS — G40909 Epilepsy, unspecified, not intractable, without status epilepticus: ICD-10-CM

## 2017-12-31 DIAGNOSIS — D699 Hemorrhagic condition, unspecified: ICD-10-CM

## 2017-12-31 DIAGNOSIS — D4959 Neoplasm of unspecified behavior of other genitourinary organ: ICD-10-CM

## 2017-12-31 DIAGNOSIS — K219 Gastro-esophageal reflux disease without esophagitis: ICD-10-CM

## 2017-12-31 DIAGNOSIS — M359 Systemic involvement of connective tissue, unspecified: ICD-10-CM

## 2017-12-31 DIAGNOSIS — K639 Disease of intestine, unspecified: ICD-10-CM

## 2017-12-31 DIAGNOSIS — I8290 Acute embolism and thrombosis of unspecified vein: ICD-10-CM

## 2017-12-31 DIAGNOSIS — I10 Essential (primary) hypertension: ICD-10-CM

## 2017-12-31 DIAGNOSIS — IMO0002 Ulcer: ICD-10-CM

## 2017-12-31 DIAGNOSIS — K5090 Crohn's disease, unspecified, without complications: Principal | ICD-10-CM

## 2017-12-31 DIAGNOSIS — G43909 Migraine, unspecified, not intractable, without status migrainosus: ICD-10-CM

## 2017-12-31 DIAGNOSIS — J302 Other seasonal allergic rhinitis: ICD-10-CM

## 2017-12-31 MED ORDER — BUMETANIDE 1 MG PO TAB
1 mg | ORAL_TABLET | Freq: Every day | ORAL | 3 refills | Status: AC
Start: 2017-12-31 — End: 2018-10-29

## 2018-01-01 DIAGNOSIS — K5000 Crohn's disease of small intestine without complications: ICD-10-CM

## 2018-01-01 DIAGNOSIS — R609 Edema, unspecified: ICD-10-CM

## 2018-01-01 DIAGNOSIS — G4733 Obstructive sleep apnea (adult) (pediatric): ICD-10-CM

## 2018-01-03 ENCOUNTER — Encounter: Admit: 2018-01-03 | Discharge: 2018-01-03 | Payer: BC Managed Care – PPO

## 2018-01-04 ENCOUNTER — Encounter: Admit: 2018-01-04 | Discharge: 2018-01-04 | Payer: BC Managed Care – PPO

## 2018-01-05 ENCOUNTER — Encounter: Admit: 2018-01-05 | Discharge: 2018-01-05 | Payer: BC Managed Care – PPO

## 2018-01-05 DIAGNOSIS — K5090 Crohn's disease, unspecified, without complications: Principal | ICD-10-CM

## 2018-01-05 DIAGNOSIS — E139 Other specified diabetes mellitus without complications: ICD-10-CM

## 2018-01-05 DIAGNOSIS — I8290 Acute embolism and thrombosis of unspecified vein: ICD-10-CM

## 2018-01-05 DIAGNOSIS — E119 Type 2 diabetes mellitus without complications: ICD-10-CM

## 2018-01-05 DIAGNOSIS — IMO0002 Ulcer: ICD-10-CM

## 2018-01-05 DIAGNOSIS — R42 Dizziness and giddiness: ICD-10-CM

## 2018-01-05 DIAGNOSIS — G43909 Migraine, unspecified, not intractable, without status migrainosus: ICD-10-CM

## 2018-01-05 DIAGNOSIS — M797 Fibromyalgia: ICD-10-CM

## 2018-01-05 DIAGNOSIS — G40909 Epilepsy, unspecified, not intractable, without status epilepticus: ICD-10-CM

## 2018-01-05 DIAGNOSIS — D699 Hemorrhagic condition, unspecified: ICD-10-CM

## 2018-01-05 DIAGNOSIS — M359 Systemic involvement of connective tissue, unspecified: ICD-10-CM

## 2018-01-05 DIAGNOSIS — D4959 Neoplasm of unspecified behavior of other genitourinary organ: ICD-10-CM

## 2018-01-05 DIAGNOSIS — K639 Disease of intestine, unspecified: ICD-10-CM

## 2018-01-05 DIAGNOSIS — J302 Other seasonal allergic rhinitis: ICD-10-CM

## 2018-01-05 DIAGNOSIS — I10 Essential (primary) hypertension: ICD-10-CM

## 2018-01-05 DIAGNOSIS — K7581 Nonalcoholic steatohepatitis (NASH): ICD-10-CM

## 2018-01-05 DIAGNOSIS — K219 Gastro-esophageal reflux disease without esophagitis: ICD-10-CM

## 2018-01-13 ENCOUNTER — Encounter: Admit: 2018-01-13 | Discharge: 2018-01-13 | Payer: BC Managed Care – PPO

## 2018-01-15 ENCOUNTER — Encounter: Admit: 2018-01-15 | Discharge: 2018-01-15 | Payer: BC Managed Care – PPO

## 2018-01-20 ENCOUNTER — Encounter: Admit: 2018-01-20 | Discharge: 2018-01-20 | Payer: BC Managed Care – PPO

## 2018-01-21 ENCOUNTER — Encounter: Admit: 2018-01-21 | Discharge: 2018-01-21 | Payer: BC Managed Care – PPO

## 2018-01-21 DIAGNOSIS — E1165 Type 2 diabetes mellitus with hyperglycemia: Principal | ICD-10-CM

## 2018-01-26 ENCOUNTER — Ambulatory Visit: Admit: 2018-01-26 | Discharge: 2018-01-26 | Payer: BC Managed Care – PPO

## 2018-01-26 ENCOUNTER — Encounter: Admit: 2018-01-26 | Discharge: 2018-01-26 | Payer: BC Managed Care – PPO

## 2018-01-26 DIAGNOSIS — G43909 Migraine, unspecified, not intractable, without status migrainosus: ICD-10-CM

## 2018-01-26 DIAGNOSIS — R601 Generalized edema: Principal | ICD-10-CM

## 2018-01-26 DIAGNOSIS — I87009 Postthrombotic syndrome without complications of unspecified extremity: ICD-10-CM

## 2018-01-26 DIAGNOSIS — I10 Essential (primary) hypertension: ICD-10-CM

## 2018-01-26 DIAGNOSIS — K639 Disease of intestine, unspecified: ICD-10-CM

## 2018-01-26 DIAGNOSIS — E139 Other specified diabetes mellitus without complications: ICD-10-CM

## 2018-01-26 DIAGNOSIS — K5090 Crohn's disease, unspecified, without complications: Principal | ICD-10-CM

## 2018-01-26 DIAGNOSIS — M359 Systemic involvement of connective tissue, unspecified: ICD-10-CM

## 2018-01-26 DIAGNOSIS — K219 Gastro-esophageal reflux disease without esophagitis: ICD-10-CM

## 2018-01-26 DIAGNOSIS — M797 Fibromyalgia: ICD-10-CM

## 2018-01-26 DIAGNOSIS — E7800 Pure hypercholesterolemia, unspecified: ICD-10-CM

## 2018-01-26 DIAGNOSIS — G4733 Obstructive sleep apnea (adult) (pediatric): Secondary | ICD-10-CM

## 2018-01-26 DIAGNOSIS — J302 Other seasonal allergic rhinitis: ICD-10-CM

## 2018-01-26 DIAGNOSIS — I82B12 Acute embolism and thrombosis of left subclavian vein: ICD-10-CM

## 2018-01-26 DIAGNOSIS — D699 Hemorrhagic condition, unspecified: ICD-10-CM

## 2018-01-26 DIAGNOSIS — R42 Dizziness and giddiness: ICD-10-CM

## 2018-01-26 DIAGNOSIS — E119 Type 2 diabetes mellitus without complications: ICD-10-CM

## 2018-01-26 DIAGNOSIS — IMO0002 Ulcer: ICD-10-CM

## 2018-01-26 DIAGNOSIS — G40909 Epilepsy, unspecified, not intractable, without status epilepticus: ICD-10-CM

## 2018-01-26 DIAGNOSIS — K7581 Nonalcoholic steatohepatitis (NASH): ICD-10-CM

## 2018-01-26 DIAGNOSIS — Z794 Long term (current) use of insulin: ICD-10-CM

## 2018-01-26 DIAGNOSIS — D4959 Neoplasm of unspecified behavior of other genitourinary organ: ICD-10-CM

## 2018-01-26 DIAGNOSIS — I8290 Acute embolism and thrombosis of unspecified vein: ICD-10-CM

## 2018-01-26 DIAGNOSIS — N183 Chronic kidney disease, stage 3 (moderate): Secondary | ICD-10-CM

## 2018-01-26 LAB — COMPREHENSIVE METABOLIC PANEL
Lab: 0.4 mg/dL (ref 0.3–1.2)
Lab: 10 mg/dL (ref 8.5–10.6)
Lab: 13 — ABNORMAL HIGH (ref 3–12)
Lab: 137 MMOL/L (ref 137–147)
Lab: 22 MMOL/L (ref 21–30)
Lab: 38 U/L (ref 7–40)
Lab: 4.2 g/dL (ref 3.5–5.0)
Lab: 4.6 MMOL/L — ABNORMAL LOW (ref >60)
Lab: 41 mL/min — ABNORMAL LOW (ref >60)
Lab: 50 mL/min — ABNORMAL LOW (ref >60)
Lab: 63 U/L — ABNORMAL HIGH (ref 7–56)
Lab: 7.2 g/dL (ref 6.0–8.0)
Lab: 89 U/L (ref 25–110)

## 2018-01-26 LAB — CBC
Lab: 14 10*3/uL — ABNORMAL HIGH (ref 4.5–11.0)
Lab: 4.6 M/UL (ref 4.0–5.0)

## 2018-01-26 LAB — MAGNESIUM: Lab: 2.2 mg/dL — ABNORMAL LOW (ref 1.6–2.6)

## 2018-01-26 LAB — IRON + BINDING CAPACITY + %SAT+ FERRITIN
Lab: 513 ug/dL — ABNORMAL HIGH (ref 270–380)
Lab: 52 ng/mL — ABNORMAL HIGH (ref 10–200)
Lab: 70 ug/dL (ref >60)

## 2018-01-26 LAB — BNP (B-TYPE NATRIURETIC PEPTI): Lab: 32 pg/mL — ABNORMAL LOW (ref 0–100)

## 2018-01-31 ENCOUNTER — Encounter: Admit: 2018-01-31 | Discharge: 2018-01-31 | Payer: BC Managed Care – PPO

## 2018-02-06 ENCOUNTER — Encounter: Admit: 2018-02-06 | Discharge: 2018-02-06 | Payer: BC Managed Care – PPO

## 2018-02-11 ENCOUNTER — Ambulatory Visit: Admit: 2018-02-11 | Discharge: 2018-02-12 | Payer: BC Managed Care – PPO

## 2018-02-11 ENCOUNTER — Encounter: Admit: 2018-02-11 | Discharge: 2018-02-11 | Payer: BC Managed Care – PPO

## 2018-02-11 DIAGNOSIS — D4959 Neoplasm of unspecified behavior of other genitourinary organ: ICD-10-CM

## 2018-02-11 DIAGNOSIS — J302 Other seasonal allergic rhinitis: ICD-10-CM

## 2018-02-11 DIAGNOSIS — E119 Type 2 diabetes mellitus without complications: ICD-10-CM

## 2018-02-11 DIAGNOSIS — R42 Dizziness and giddiness: ICD-10-CM

## 2018-02-11 DIAGNOSIS — G43909 Migraine, unspecified, not intractable, without status migrainosus: ICD-10-CM

## 2018-02-11 DIAGNOSIS — M797 Fibromyalgia: ICD-10-CM

## 2018-02-11 DIAGNOSIS — I8290 Acute embolism and thrombosis of unspecified vein: ICD-10-CM

## 2018-02-11 DIAGNOSIS — K5090 Crohn's disease, unspecified, without complications: Principal | ICD-10-CM

## 2018-02-11 DIAGNOSIS — D699 Hemorrhagic condition, unspecified: ICD-10-CM

## 2018-02-11 DIAGNOSIS — M359 Systemic involvement of connective tissue, unspecified: ICD-10-CM

## 2018-02-11 DIAGNOSIS — K7581 Nonalcoholic steatohepatitis (NASH): ICD-10-CM

## 2018-02-11 DIAGNOSIS — E139 Other specified diabetes mellitus without complications: ICD-10-CM

## 2018-02-11 DIAGNOSIS — G40909 Epilepsy, unspecified, not intractable, without status epilepticus: ICD-10-CM

## 2018-02-11 DIAGNOSIS — IMO0002 Ulcer: ICD-10-CM

## 2018-02-11 DIAGNOSIS — K219 Gastro-esophageal reflux disease without esophagitis: ICD-10-CM

## 2018-02-11 DIAGNOSIS — K639 Disease of intestine, unspecified: ICD-10-CM

## 2018-02-11 DIAGNOSIS — I10 Essential (primary) hypertension: ICD-10-CM

## 2018-02-12 ENCOUNTER — Ambulatory Visit: Admit: 2018-02-11 | Discharge: 2018-02-11 | Payer: BC Managed Care – PPO

## 2018-02-12 DIAGNOSIS — N183 Chronic kidney disease, stage 3 (moderate): ICD-10-CM

## 2018-02-12 DIAGNOSIS — E669 Obesity, unspecified: ICD-10-CM

## 2018-02-12 DIAGNOSIS — E1165 Type 2 diabetes mellitus with hyperglycemia: Principal | ICD-10-CM

## 2018-02-12 DIAGNOSIS — G4733 Obstructive sleep apnea (adult) (pediatric): ICD-10-CM

## 2018-02-16 ENCOUNTER — Encounter: Admit: 2018-02-16 | Discharge: 2018-02-16 | Payer: BC Managed Care – PPO

## 2018-03-16 ENCOUNTER — Encounter: Admit: 2018-03-16 | Discharge: 2018-03-16 | Payer: BC Managed Care – PPO

## 2018-03-18 ENCOUNTER — Encounter: Admit: 2018-03-18 | Discharge: 2018-03-18 | Payer: BC Managed Care – PPO

## 2018-03-18 MED ORDER — INSULIN ASPART 100 UNIT/ML SC FLEXPEN
0 refills | 42.00000 days | Status: AC
Start: 2018-03-18 — End: 2018-03-23

## 2018-03-23 ENCOUNTER — Encounter: Admit: 2018-03-23 | Discharge: 2018-03-23 | Payer: BC Managed Care – PPO

## 2018-03-23 DIAGNOSIS — E1165 Type 2 diabetes mellitus with hyperglycemia: Principal | ICD-10-CM

## 2018-03-23 MED ORDER — INSULIN ASPART 100 UNIT/ML SC FLEXPEN
0 refills | 42.00000 days | Status: AC
Start: 2018-03-23 — End: 2018-03-28

## 2018-03-27 ENCOUNTER — Encounter: Admit: 2018-03-27 | Discharge: 2018-03-27 | Payer: BC Managed Care – PPO

## 2018-03-27 ENCOUNTER — Ambulatory Visit: Admit: 2018-03-27 | Discharge: 2018-03-27 | Payer: BC Managed Care – PPO

## 2018-03-27 DIAGNOSIS — I8290 Acute embolism and thrombosis of unspecified vein: ICD-10-CM

## 2018-03-27 DIAGNOSIS — M797 Fibromyalgia: ICD-10-CM

## 2018-03-27 DIAGNOSIS — E1165 Type 2 diabetes mellitus with hyperglycemia: Principal | ICD-10-CM

## 2018-03-27 DIAGNOSIS — J302 Other seasonal allergic rhinitis: ICD-10-CM

## 2018-03-27 DIAGNOSIS — G40909 Epilepsy, unspecified, not intractable, without status epilepticus: ICD-10-CM

## 2018-03-27 DIAGNOSIS — D4959 Neoplasm of unspecified behavior of other genitourinary organ: ICD-10-CM

## 2018-03-27 DIAGNOSIS — I10 Essential (primary) hypertension: ICD-10-CM

## 2018-03-27 DIAGNOSIS — E7800 Pure hypercholesterolemia, unspecified: ICD-10-CM

## 2018-03-27 DIAGNOSIS — K5090 Crohn's disease, unspecified, without complications: Principal | ICD-10-CM

## 2018-03-27 DIAGNOSIS — K7581 Nonalcoholic steatohepatitis (NASH): ICD-10-CM

## 2018-03-27 DIAGNOSIS — D699 Hemorrhagic condition, unspecified: ICD-10-CM

## 2018-03-27 DIAGNOSIS — IMO0002 Ulcer: ICD-10-CM

## 2018-03-27 DIAGNOSIS — M359 Systemic involvement of connective tissue, unspecified: ICD-10-CM

## 2018-03-27 DIAGNOSIS — K639 Disease of intestine, unspecified: ICD-10-CM

## 2018-03-27 DIAGNOSIS — K219 Gastro-esophageal reflux disease without esophagitis: ICD-10-CM

## 2018-03-27 DIAGNOSIS — G43909 Migraine, unspecified, not intractable, without status migrainosus: ICD-10-CM

## 2018-03-27 DIAGNOSIS — R42 Dizziness and giddiness: ICD-10-CM

## 2018-03-27 DIAGNOSIS — E139 Other specified diabetes mellitus without complications: ICD-10-CM

## 2018-03-27 DIAGNOSIS — E119 Type 2 diabetes mellitus without complications: ICD-10-CM

## 2018-03-27 LAB — LIPID PROFILE
Lab: 107 mg/dL (ref 4–12)
Lab: 144 mg/dL — ABNORMAL HIGH (ref <200)
Lab: 290 mg/dL — ABNORMAL HIGH (ref <150)
Lab: 37 mg/dL — ABNORMAL LOW (ref >40)
Lab: 58 mg/dL (ref 24–44)
Lab: 83 mg/dL (ref <100)

## 2018-03-27 LAB — MICROALB/CR RATIO-URINE RANDOM
Lab: 25 mg/dL
Lab: 28 ug/mg (ref <30)
Lab: 7.1 ug/mL (ref <19)

## 2018-03-27 MED ORDER — INSULIN LISPRO 200 UNIT/ML (3 ML) SC INPN
3 refills | 30.00000 days | Status: AC
Start: 2018-03-27 — End: 2018-06-16

## 2018-03-27 MED ORDER — INSULIN ASPART 100 UNIT/ML SC FLEXPEN
3 refills | 30.00000 days | Status: AC
Start: 2018-03-27 — End: 2018-06-23

## 2018-03-29 ENCOUNTER — Encounter: Admit: 2018-03-29 | Discharge: 2018-03-29 | Payer: BC Managed Care – PPO

## 2018-03-29 DIAGNOSIS — IMO0002 Ulcer: ICD-10-CM

## 2018-03-29 DIAGNOSIS — J302 Other seasonal allergic rhinitis: ICD-10-CM

## 2018-03-29 DIAGNOSIS — D699 Hemorrhagic condition, unspecified: ICD-10-CM

## 2018-03-29 DIAGNOSIS — I8290 Acute embolism and thrombosis of unspecified vein: ICD-10-CM

## 2018-03-29 DIAGNOSIS — I10 Essential (primary) hypertension: ICD-10-CM

## 2018-03-29 DIAGNOSIS — K7581 Nonalcoholic steatohepatitis (NASH): ICD-10-CM

## 2018-03-29 DIAGNOSIS — M359 Systemic involvement of connective tissue, unspecified: ICD-10-CM

## 2018-03-29 DIAGNOSIS — G40909 Epilepsy, unspecified, not intractable, without status epilepticus: ICD-10-CM

## 2018-03-29 DIAGNOSIS — K5090 Crohn's disease, unspecified, without complications: Principal | ICD-10-CM

## 2018-03-29 DIAGNOSIS — R42 Dizziness and giddiness: ICD-10-CM

## 2018-03-29 DIAGNOSIS — D4959 Neoplasm of unspecified behavior of other genitourinary organ: ICD-10-CM

## 2018-03-29 DIAGNOSIS — E119 Type 2 diabetes mellitus without complications: ICD-10-CM

## 2018-03-29 DIAGNOSIS — G43909 Migraine, unspecified, not intractable, without status migrainosus: ICD-10-CM

## 2018-03-29 DIAGNOSIS — M797 Fibromyalgia: ICD-10-CM

## 2018-03-29 DIAGNOSIS — E139 Other specified diabetes mellitus without complications: ICD-10-CM

## 2018-03-29 DIAGNOSIS — K639 Disease of intestine, unspecified: ICD-10-CM

## 2018-03-29 DIAGNOSIS — K219 Gastro-esophageal reflux disease without esophagitis: ICD-10-CM

## 2018-03-30 ENCOUNTER — Encounter: Admit: 2018-03-30 | Discharge: 2018-03-30 | Payer: BC Managed Care – PPO

## 2018-03-31 ENCOUNTER — Encounter: Admit: 2018-03-31 | Discharge: 2018-03-31 | Payer: BC Managed Care – PPO

## 2018-03-31 DIAGNOSIS — I10 Essential (primary) hypertension: Principal | ICD-10-CM

## 2018-04-10 ENCOUNTER — Encounter: Admit: 2018-04-10 | Discharge: 2018-04-10 | Payer: BC Managed Care – PPO

## 2018-04-10 ENCOUNTER — Emergency Department
Admit: 2018-04-10 | Discharge: 2018-04-11 | Disposition: A | Discharge status: Home / Self Care | Payer: BC Managed Care – PPO

## 2018-04-10 DIAGNOSIS — K7581 Nonalcoholic steatohepatitis (NASH): ICD-10-CM

## 2018-04-10 DIAGNOSIS — I8290 Acute embolism and thrombosis of unspecified vein: ICD-10-CM

## 2018-04-10 DIAGNOSIS — D699 Hemorrhagic condition, unspecified: ICD-10-CM

## 2018-04-10 DIAGNOSIS — K639 Disease of intestine, unspecified: ICD-10-CM

## 2018-04-10 DIAGNOSIS — K219 Gastro-esophageal reflux disease without esophagitis: ICD-10-CM

## 2018-04-10 DIAGNOSIS — D4959 Neoplasm of unspecified behavior of other genitourinary organ: ICD-10-CM

## 2018-04-10 DIAGNOSIS — M797 Fibromyalgia: ICD-10-CM

## 2018-04-10 DIAGNOSIS — K50019 Crohn's disease of small intestine with unspecified complications: Secondary | ICD-10-CM

## 2018-04-10 DIAGNOSIS — K5090 Crohn's disease, unspecified, without complications: Principal | ICD-10-CM

## 2018-04-10 DIAGNOSIS — M359 Systemic involvement of connective tissue, unspecified: ICD-10-CM

## 2018-04-10 DIAGNOSIS — G40909 Epilepsy, unspecified, not intractable, without status epilepticus: ICD-10-CM

## 2018-04-10 DIAGNOSIS — R42 Dizziness and giddiness: ICD-10-CM

## 2018-04-10 DIAGNOSIS — G43909 Migraine, unspecified, not intractable, without status migrainosus: ICD-10-CM

## 2018-04-10 DIAGNOSIS — E119 Type 2 diabetes mellitus without complications: ICD-10-CM

## 2018-04-10 DIAGNOSIS — J302 Other seasonal allergic rhinitis: ICD-10-CM

## 2018-04-10 DIAGNOSIS — IMO0002 Ulcer: ICD-10-CM

## 2018-04-10 DIAGNOSIS — E139 Other specified diabetes mellitus without complications: ICD-10-CM

## 2018-04-10 DIAGNOSIS — I10 Essential (primary) hypertension: ICD-10-CM

## 2018-04-10 LAB — COMPREHENSIVE METABOLIC PANEL
Lab: 0.5 mg/dL (ref 0.3–1.2)
Lab: 1.4 mg/dL — ABNORMAL HIGH (ref 0.4–1.00)
Lab: 10 mg/dL — ABNORMAL HIGH (ref 8.5–10.6)
Lab: 111 U/L — ABNORMAL HIGH (ref 25–110)
Lab: 138 MMOL/L (ref 137–147)
Lab: 16 — ABNORMAL HIGH (ref 3–12)
Lab: 21 MMOL/L (ref 21–30)
Lab: 29 mg/dL — ABNORMAL HIGH (ref 7–25)
Lab: 37 mL/min — ABNORMAL LOW (ref >60)
Lab: 38 U/L (ref 7–40)
Lab: 4.4 g/dL (ref 3.5–5.0)
Lab: 45 mL/min — ABNORMAL LOW (ref >60)
Lab: 59 U/L — ABNORMAL HIGH (ref 7–56)
Lab: 7.3 g/dL (ref 6.0–8.0)
Lab: 76 mg/dL (ref 70–100)

## 2018-04-10 LAB — URINALYSIS MICROSCOPIC REFLEX TO CULTURE

## 2018-04-10 LAB — URINALYSIS DIPSTICK REFLEX TO CULTURE
Lab: NEGATIVE
Lab: NEGATIVE
Lab: NEGATIVE

## 2018-04-10 LAB — LIPASE: Lab: 56 U/L (ref 11–82)

## 2018-04-10 LAB — POC GLUCOSE: Lab: 118 mg/dL — ABNORMAL HIGH (ref 70–100)

## 2018-04-10 LAB — LACTIC ACID(LACTATE): Lab: 1.7 MMOL/L (ref 0.5–2.0)

## 2018-04-10 MED ORDER — LIDOCAINE-EPINEPHRINE 1 %-1:100,000 IJ SOLN
20 mL | Freq: Once | INTRAMUSCULAR | 0 refills | Status: CP
Start: 2018-04-10 — End: ?
  Administered 2018-04-11: 20 mL via INTRAMUSCULAR

## 2018-04-10 MED ORDER — OXYCODONE 5 MG PO TAB
5 mg | Freq: Once | ORAL | 0 refills | Status: CP
Start: 2018-04-10 — End: ?
  Administered 2018-04-10: 23:00:00 5 mg via ORAL

## 2018-04-10 MED ORDER — DOXYCYCLINE MONOHYDRATE 100 MG PO CAP
100 mg | ORAL_CAPSULE | Freq: Two times a day (BID) | ORAL | 0 refills | 8.00000 days | Status: AC
Start: 2018-04-10 — End: ?

## 2018-04-10 MED ORDER — DOXYCYCLINE HYCLATE 100 MG PO TAB
100 mg | Freq: Once | ORAL | 0 refills | Status: CP
Start: 2018-04-10 — End: ?
  Administered 2018-04-11: 01:00:00 100 mg via ORAL

## 2018-04-11 DIAGNOSIS — E1122 Type 2 diabetes mellitus with diabetic chronic kidney disease: ICD-10-CM

## 2018-04-11 DIAGNOSIS — N183 Chronic kidney disease, stage 3 (moderate): Secondary | ICD-10-CM

## 2018-04-11 DIAGNOSIS — E669 Obesity, unspecified: ICD-10-CM

## 2018-04-11 DIAGNOSIS — L02211 Cutaneous abscess of abdominal wall: Principal | ICD-10-CM

## 2018-04-11 DIAGNOSIS — Z6830 Body mass index (BMI) 30.0-30.9, adult: ICD-10-CM

## 2018-04-11 LAB — CBC AND DIFF
Lab: 14 10*3/uL — ABNORMAL HIGH (ref 1.8–7.0)
Lab: 20 10*3/uL — ABNORMAL HIGH (ref 4.5–11.0)
Lab: 4.8 M/UL (ref 4.0–5.0)

## 2018-04-13 ENCOUNTER — Emergency Department
Admit: 2018-04-13 | Discharge: 2018-04-13 | Disposition: A | Discharge status: Home / Self Care | Payer: BC Managed Care – PPO

## 2018-04-13 ENCOUNTER — Encounter: Admit: 2018-04-13 | Discharge: 2018-04-13 | Payer: BC Managed Care – PPO

## 2018-04-13 DIAGNOSIS — Z4800 Encounter for change or removal of nonsurgical wound dressing: Principal | ICD-10-CM

## 2018-04-16 ENCOUNTER — Encounter: Admit: 2018-04-16 | Discharge: 2018-04-16 | Payer: BC Managed Care – PPO

## 2018-04-17 ENCOUNTER — Encounter: Admit: 2018-04-17 | Discharge: 2018-04-17 | Payer: BC Managed Care – PPO

## 2018-04-17 DIAGNOSIS — K50018 Crohn's disease of small intestine with other complication: Principal | ICD-10-CM

## 2018-04-17 MED ORDER — BUDESONIDE 3 MG PO CECX
ORAL_CAPSULE | ORAL | 1 refills | 30.00000 days | Status: AC
Start: 2018-04-17 — End: 2018-10-14

## 2018-05-08 ENCOUNTER — Encounter: Admit: 2018-05-08 | Discharge: 2018-05-08 | Payer: BC Managed Care – PPO

## 2018-05-08 MED ORDER — PEN NEEDLE, DIABETIC 32 GAUGE X 5/32" MISC NDLE
Freq: Before meals | 10 refills | 30.00000 days | Status: AC
Start: 2018-05-08 — End: 2018-05-12

## 2018-05-12 ENCOUNTER — Encounter: Admit: 2018-05-12 | Discharge: 2018-05-12 | Payer: BC Managed Care – PPO

## 2018-05-12 MED ORDER — PEN NEEDLE, DIABETIC 32 GAUGE X 5/32" MISC NDLE
Freq: Before meals | 10 refills | 90.00000 days | Status: AC
Start: 2018-05-12 — End: 2018-05-13

## 2018-05-13 MED ORDER — PEN NEEDLE, DIABETIC 32 GAUGE X 5/32" MISC NDLE
1 | Freq: Every day | 2 refills | 90.00000 days | Status: AC
Start: 2018-05-13 — End: 2019-01-28

## 2018-06-15 ENCOUNTER — Encounter: Admit: 2018-06-15 | Discharge: 2018-06-15 | Payer: BC Managed Care – PPO

## 2018-06-16 ENCOUNTER — Encounter: Admit: 2018-06-16 | Discharge: 2018-06-16 | Payer: BC Managed Care – PPO

## 2018-06-16 DIAGNOSIS — E1165 Type 2 diabetes mellitus with hyperglycemia: Principal | ICD-10-CM

## 2018-06-16 MED ORDER — INSULIN LISPRO 200 UNIT/ML (3 ML) SC INPN
3 refills | 30.00000 days | Status: AC
Start: 2018-06-16 — End: 2018-07-24

## 2018-06-18 ENCOUNTER — Encounter: Admit: 2018-06-18 | Discharge: 2018-06-18 | Payer: BC Managed Care – PPO

## 2018-06-23 ENCOUNTER — Ambulatory Visit: Admit: 2018-06-23 | Discharge: 2018-06-24 | Payer: BC Managed Care – PPO

## 2018-06-23 ENCOUNTER — Encounter: Admit: 2018-06-23 | Discharge: 2018-06-23 | Payer: BC Managed Care – PPO

## 2018-06-23 DIAGNOSIS — D699 Hemorrhagic condition, unspecified: ICD-10-CM

## 2018-06-23 DIAGNOSIS — E119 Type 2 diabetes mellitus without complications: ICD-10-CM

## 2018-06-23 DIAGNOSIS — K219 Gastro-esophageal reflux disease without esophagitis: ICD-10-CM

## 2018-06-23 DIAGNOSIS — I8290 Acute embolism and thrombosis of unspecified vein: ICD-10-CM

## 2018-06-23 DIAGNOSIS — R42 Dizziness and giddiness: ICD-10-CM

## 2018-06-23 DIAGNOSIS — G40909 Epilepsy, unspecified, not intractable, without status epilepticus: ICD-10-CM

## 2018-06-23 DIAGNOSIS — K5090 Crohn's disease, unspecified, without complications: Principal | ICD-10-CM

## 2018-06-23 DIAGNOSIS — G43909 Migraine, unspecified, not intractable, without status migrainosus: ICD-10-CM

## 2018-06-23 DIAGNOSIS — E139 Other specified diabetes mellitus without complications: ICD-10-CM

## 2018-06-23 DIAGNOSIS — D4959 Neoplasm of unspecified behavior of other genitourinary organ: ICD-10-CM

## 2018-06-23 DIAGNOSIS — IMO0002 Ulcer: ICD-10-CM

## 2018-06-23 DIAGNOSIS — K639 Disease of intestine, unspecified: ICD-10-CM

## 2018-06-23 DIAGNOSIS — M359 Systemic involvement of connective tissue, unspecified: ICD-10-CM

## 2018-06-23 DIAGNOSIS — K7581 Nonalcoholic steatohepatitis (NASH): ICD-10-CM

## 2018-06-23 DIAGNOSIS — J302 Other seasonal allergic rhinitis: ICD-10-CM

## 2018-06-23 DIAGNOSIS — M797 Fibromyalgia: ICD-10-CM

## 2018-06-23 DIAGNOSIS — I10 Essential (primary) hypertension: ICD-10-CM

## 2018-06-23 MED ORDER — CODEINE-GUAIFENESIN 8-200 MG/5 ML PO LIQD
10 mg | Freq: Every evening | ORAL | 0 refills | Status: AC
Start: 2018-06-23 — End: 2018-07-24

## 2018-06-23 NOTE — Progress Notes
History of Present Illness  Tina Snyder is a 53 y.o. female.    INTERVAL hx  She has had two fractures this year.  She had a bone density in January 2020 at [facility] which osteopenia.     She is now on U500 humalog.       Stopped IV iron. Repeat CBC looked OK>     Cough is the major active issue. She has had chronic bronchitis for a few weeks.     She is on fluoxetine and duloxetine for depression/fibromyalgia.     1.5 hour commute here to [facility].  Would like to minimize visits if possible. She retains a PCP there for coughs and colds.    She continues on a ppi.     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 She was briefly on coumadin but stopped in setting of recurrent bleeding. She has a history of subclavian thrombosis. Recent CT imaging showed a lot of chest wall collaterals.  Pt not aware.  Not on any therapy.  [place] of her left arm showed no DVT. She had a negative VQ scan for PE. She had a CT chest 2016 Development of anterior superior mediastinal vascular collaterals secondary to marked narrowing or occlusion of the left brachiocephalic vein. 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  She had a CTA chest in 2019 with Dr. Hollie Beach and left brachiocephalic vein atresia noted with prominent collaterals.     I reviewed the CTA Chest/Abd/Pelvis.  She has chronic left innominate vein narrowing.  There was no other finding to suggest cause of her generalized swelling.  She may need other systemic evaluation to help determine the cause of her generalized swelling.    ???  Gladstone Lighter, MD  Joni Reining, please share results with her.)    Peripheral edema:  Progressive edema in her bilateral arms, underarms and chest wall and additionally in her feet. Started in LUE predominantly. Noted in RUE after a prolonged hospital stay in 2019 where she lay on her right side.  She

## 2018-06-24 ENCOUNTER — Encounter: Admit: 2018-06-24 | Discharge: 2018-06-24 | Payer: BC Managed Care – PPO

## 2018-06-24 DIAGNOSIS — E1165 Type 2 diabetes mellitus with hyperglycemia: Principal | ICD-10-CM

## 2018-06-24 DIAGNOSIS — R740 Nonspecific elevation of levels of transaminase and lactic acid dehydrogenase [LDH]: Principal | ICD-10-CM

## 2018-06-24 DIAGNOSIS — Z79899 Other long term (current) drug therapy: ICD-10-CM

## 2018-06-24 DIAGNOSIS — K5010 Crohn's disease of large intestine without complications: ICD-10-CM

## 2018-06-24 DIAGNOSIS — R05 Cough: ICD-10-CM

## 2018-06-24 DIAGNOSIS — E559 Vitamin D deficiency, unspecified: ICD-10-CM

## 2018-06-24 MED ORDER — INSULIN REGULAR HUM U-500 CONC 500 UNIT/ML (3 ML) SC INPN
Freq: Every morning | 3 refills | 30.00000 days | Status: AC
Start: 2018-06-24 — End: 2018-07-28

## 2018-07-02 ENCOUNTER — Encounter: Admit: 2018-07-02 | Discharge: 2018-07-02 | Payer: BC Managed Care – PPO

## 2018-07-02 DIAGNOSIS — I8290 Acute embolism and thrombosis of unspecified vein: ICD-10-CM

## 2018-07-02 DIAGNOSIS — R42 Dizziness and giddiness: ICD-10-CM

## 2018-07-02 DIAGNOSIS — M359 Systemic involvement of connective tissue, unspecified: ICD-10-CM

## 2018-07-02 DIAGNOSIS — K7581 Nonalcoholic steatohepatitis (NASH): ICD-10-CM

## 2018-07-02 DIAGNOSIS — IMO0002 Ulcer: ICD-10-CM

## 2018-07-02 DIAGNOSIS — K5090 Crohn's disease, unspecified, without complications: Principal | ICD-10-CM

## 2018-07-02 DIAGNOSIS — K639 Disease of intestine, unspecified: ICD-10-CM

## 2018-07-02 DIAGNOSIS — E119 Type 2 diabetes mellitus without complications: ICD-10-CM

## 2018-07-02 DIAGNOSIS — E139 Other specified diabetes mellitus without complications: ICD-10-CM

## 2018-07-02 DIAGNOSIS — I10 Essential (primary) hypertension: ICD-10-CM

## 2018-07-02 DIAGNOSIS — D4959 Neoplasm of unspecified behavior of other genitourinary organ: ICD-10-CM

## 2018-07-02 DIAGNOSIS — K219 Gastro-esophageal reflux disease without esophagitis: ICD-10-CM

## 2018-07-02 DIAGNOSIS — G40909 Epilepsy, unspecified, not intractable, without status epilepticus: ICD-10-CM

## 2018-07-02 DIAGNOSIS — G43909 Migraine, unspecified, not intractable, without status migrainosus: ICD-10-CM

## 2018-07-02 DIAGNOSIS — M797 Fibromyalgia: ICD-10-CM

## 2018-07-02 DIAGNOSIS — J302 Other seasonal allergic rhinitis: ICD-10-CM

## 2018-07-02 DIAGNOSIS — D699 Hemorrhagic condition, unspecified: ICD-10-CM

## 2018-07-07 ENCOUNTER — Encounter: Admit: 2018-07-07 | Discharge: 2018-07-07 | Payer: BC Managed Care – PPO

## 2018-07-13 ENCOUNTER — Encounter: Admit: 2018-07-13 | Discharge: 2018-07-13 | Payer: BC Managed Care – PPO

## 2018-07-13 DIAGNOSIS — E119 Type 2 diabetes mellitus without complications: ICD-10-CM

## 2018-07-13 DIAGNOSIS — K5090 Crohn's disease, unspecified, without complications: Principal | ICD-10-CM

## 2018-07-13 DIAGNOSIS — J302 Other seasonal allergic rhinitis: ICD-10-CM

## 2018-07-13 DIAGNOSIS — K219 Gastro-esophageal reflux disease without esophagitis: ICD-10-CM

## 2018-07-13 DIAGNOSIS — I10 Essential (primary) hypertension: ICD-10-CM

## 2018-07-13 DIAGNOSIS — IMO0002 Ulcer: ICD-10-CM

## 2018-07-13 DIAGNOSIS — G40909 Epilepsy, unspecified, not intractable, without status epilepticus: ICD-10-CM

## 2018-07-13 DIAGNOSIS — K639 Disease of intestine, unspecified: ICD-10-CM

## 2018-07-13 DIAGNOSIS — K7581 Nonalcoholic steatohepatitis (NASH): ICD-10-CM

## 2018-07-13 DIAGNOSIS — D4959 Neoplasm of unspecified behavior of other genitourinary organ: ICD-10-CM

## 2018-07-13 DIAGNOSIS — M359 Systemic involvement of connective tissue, unspecified: ICD-10-CM

## 2018-07-13 DIAGNOSIS — R42 Dizziness and giddiness: ICD-10-CM

## 2018-07-13 DIAGNOSIS — G43909 Migraine, unspecified, not intractable, without status migrainosus: ICD-10-CM

## 2018-07-13 DIAGNOSIS — I8290 Acute embolism and thrombosis of unspecified vein: ICD-10-CM

## 2018-07-13 DIAGNOSIS — M797 Fibromyalgia: ICD-10-CM

## 2018-07-13 DIAGNOSIS — D699 Hemorrhagic condition, unspecified: ICD-10-CM

## 2018-07-13 DIAGNOSIS — E139 Other specified diabetes mellitus without complications: ICD-10-CM

## 2018-07-14 ENCOUNTER — Encounter: Admit: 2018-07-14 | Discharge: 2018-07-14 | Payer: BC Managed Care – PPO

## 2018-07-15 ENCOUNTER — Encounter: Admit: 2018-07-15 | Discharge: 2018-07-15 | Payer: BC Managed Care – PPO

## 2018-07-15 DIAGNOSIS — G43909 Migraine, unspecified, not intractable, without status migrainosus: ICD-10-CM

## 2018-07-15 DIAGNOSIS — M797 Fibromyalgia: ICD-10-CM

## 2018-07-15 DIAGNOSIS — IMO0002 Ulcer: ICD-10-CM

## 2018-07-15 DIAGNOSIS — K5090 Crohn's disease, unspecified, without complications: Principal | ICD-10-CM

## 2018-07-15 DIAGNOSIS — K7581 Nonalcoholic steatohepatitis (NASH): ICD-10-CM

## 2018-07-15 DIAGNOSIS — J302 Other seasonal allergic rhinitis: ICD-10-CM

## 2018-07-15 DIAGNOSIS — R42 Dizziness and giddiness: ICD-10-CM

## 2018-07-15 DIAGNOSIS — I8290 Acute embolism and thrombosis of unspecified vein: ICD-10-CM

## 2018-07-15 DIAGNOSIS — E119 Type 2 diabetes mellitus without complications: ICD-10-CM

## 2018-07-15 DIAGNOSIS — D699 Hemorrhagic condition, unspecified: ICD-10-CM

## 2018-07-15 DIAGNOSIS — K219 Gastro-esophageal reflux disease without esophagitis: ICD-10-CM

## 2018-07-15 DIAGNOSIS — I10 Essential (primary) hypertension: ICD-10-CM

## 2018-07-15 DIAGNOSIS — M359 Systemic involvement of connective tissue, unspecified: ICD-10-CM

## 2018-07-15 DIAGNOSIS — D4959 Neoplasm of unspecified behavior of other genitourinary organ: ICD-10-CM

## 2018-07-15 DIAGNOSIS — E139 Other specified diabetes mellitus without complications: ICD-10-CM

## 2018-07-15 DIAGNOSIS — G40909 Epilepsy, unspecified, not intractable, without status epilepticus: ICD-10-CM

## 2018-07-15 DIAGNOSIS — K639 Disease of intestine, unspecified: ICD-10-CM

## 2018-07-21 ENCOUNTER — Encounter: Admit: 2018-07-21 | Discharge: 2018-07-21 | Payer: BC Managed Care – PPO

## 2018-07-23 ENCOUNTER — Encounter: Admit: 2018-07-23 | Discharge: 2018-07-23 | Payer: BC Managed Care – PPO

## 2018-07-24 ENCOUNTER — Encounter: Admit: 2018-07-24 | Discharge: 2018-07-24 | Payer: BC Managed Care – PPO

## 2018-07-24 DIAGNOSIS — K219 Gastro-esophageal reflux disease without esophagitis: ICD-10-CM

## 2018-07-24 DIAGNOSIS — F419 Anxiety disorder, unspecified: Principal | ICD-10-CM

## 2018-07-24 MED ORDER — PANTOPRAZOLE 40 MG PO TBEC
40 mg | ORAL_TABLET | Freq: Every day | ORAL | 3 refills | 90.00000 days | Status: DC
Start: 2018-07-24 — End: 2018-10-01

## 2018-07-24 MED ORDER — ALPRAZOLAM 0.25 MG PO TAB
.25 mg | ORAL_TABLET | Freq: Every evening | ORAL | 0 refills | Status: DC | PRN
Start: 2018-07-24 — End: 2018-11-03

## 2018-07-28 ENCOUNTER — Encounter: Admit: 2018-07-28 | Discharge: 2018-07-28 | Payer: BC Managed Care – PPO

## 2018-07-28 DIAGNOSIS — K219 Gastro-esophageal reflux disease without esophagitis: ICD-10-CM

## 2018-07-28 DIAGNOSIS — G40909 Epilepsy, unspecified, not intractable, without status epilepticus: ICD-10-CM

## 2018-07-28 DIAGNOSIS — M359 Systemic involvement of connective tissue, unspecified: ICD-10-CM

## 2018-07-28 DIAGNOSIS — R42 Dizziness and giddiness: ICD-10-CM

## 2018-07-28 DIAGNOSIS — E139 Other specified diabetes mellitus without complications: ICD-10-CM

## 2018-07-28 DIAGNOSIS — K639 Disease of intestine, unspecified: ICD-10-CM

## 2018-07-28 DIAGNOSIS — G43909 Migraine, unspecified, not intractable, without status migrainosus: ICD-10-CM

## 2018-07-28 DIAGNOSIS — D4959 Neoplasm of unspecified behavior of other genitourinary organ: ICD-10-CM

## 2018-07-28 DIAGNOSIS — E119 Type 2 diabetes mellitus without complications: ICD-10-CM

## 2018-07-28 DIAGNOSIS — K5090 Crohn's disease, unspecified, without complications: Principal | ICD-10-CM

## 2018-07-28 DIAGNOSIS — D699 Hemorrhagic condition, unspecified: ICD-10-CM

## 2018-07-28 DIAGNOSIS — J302 Other seasonal allergic rhinitis: ICD-10-CM

## 2018-07-28 DIAGNOSIS — K7581 Nonalcoholic steatohepatitis (NASH): ICD-10-CM

## 2018-07-28 DIAGNOSIS — IMO0002 Ulcer: ICD-10-CM

## 2018-07-28 DIAGNOSIS — I8290 Acute embolism and thrombosis of unspecified vein: ICD-10-CM

## 2018-07-28 DIAGNOSIS — I10 Essential (primary) hypertension: ICD-10-CM

## 2018-07-28 DIAGNOSIS — M797 Fibromyalgia: ICD-10-CM

## 2018-07-28 MED ORDER — HUMULIN R U-500 (CONC) KWIKPEN 500 UNIT/ML (3 ML) SC INPN
Freq: Every morning | SUBCUTANEOUS | 3 refills | 32.00000 days | Status: DC
Start: 2018-07-28 — End: 2018-07-28

## 2018-07-28 MED ORDER — HUMULIN R U-500 (CONC) KWIKPEN 500 UNIT/ML (3 ML) SC INPN
Freq: Every morning | SUBCUTANEOUS | 3 refills | 32.00000 days | Status: DC
Start: 2018-07-28 — End: 2018-10-15

## 2018-07-28 NOTE — Progress Notes
??? ESOPHAGOGASTRODUODENOSCOPY WITH SPECIMEN COLLECTION BY BRUSHING/ WASHING N/A 06/30/2017    Performed by Jolee Ewing, MD at [facility] ENDO   ??? COLONOSCOPY DIAGNOSTIC WITH SPECIMEN COLLECTION BY BRUSHING/ WASHING - FLEXIBLE N/A 06/30/2017    Performed by Jolee Ewing, MD at [facility] ENDO   ??? ESOPHAGOGASTRODUODENOSCOPY WITH BIOPSY - FLEXIBLE N/A 06/30/2017    Performed by Jolee Ewing, MD at [facility] ENDO   ??? COLONOSCOPY WITH BIOPSY - FLEXIBLE  06/30/2017    Performed by Jolee Ewing, MD at [facility] ENDO   ??? APPENDECTOMY     ??? BRONCHOSCOPY     ??? CHOLECYSTECTOMY     ??? GASTRIC FUNDOPLICATION     ??? HEMORRHOIDECTOMY     ??? HX BREAST LUMPECTOMY     ??? HX ENDOSCOPY      colonoscopy 2014   ??? HX HYSTERECTOMY      partial 02/1995, ovaries removed 1999   ??? OOPHORECTOMY      1999 ovaries removed   ??? SMALL BOWEL RESECTION     ??? SUBTOTAL COLECTOMY      Ileocolectomy      Family History   Problem Relation Age of Onset   ??? Celiac Disease Son    ??? Asthma Son    ??? Hypertension Mother    ??? Depression Mother    ??? Basal Cell Carcinoma Mother    ??? Hypertension Father    ??? High Cholesterol Father    ??? Depression Father    ??? Hypertension Maternal Grandmother    ??? Stroke Maternal Grandmother    ??? Heart Failure Maternal Grandfather    ??? Stroke Maternal Grandfather    ??? Migraines Maternal Grandfather    ??? Cancer Paternal Grandmother         breast   ??? Hypertension Paternal Grandmother    ??? High Cholesterol Paternal Grandmother    ??? Depression Paternal Grandmother    ??? Heart Failure Paternal Grandfather    ??? Hypertension Paternal Grandfather    ??? Blood Clots Neg Hx    ??? COPD Neg Hx    ??? Coronary Artery Disease Neg Hx    ??? Cystic Fibrosis Neg Hx    ??? DVT Neg Hx    ??? Pulmonary Embolism Neg Hx    ??? Pulmonary Fibrosis Neg Hx    ??? Pulmonary HTN Neg Hx    ??? Melanoma Neg Hx      Social History     Socioeconomic History   ??? Marital status: Married     Spouse name: Matt   ??? Number of children: 2   ??? Years of education: Not on file Date Value Ref Range Status   03/27/2018 28.40 <30 ug/mg Final     Comment:     NOTE NEW REFERENCE RANGES     Microalbumin, Random   Date Value Ref Range Status   03/27/2018 7.1 <19 MCG/ML Final       Lab Results   Component Value Date    CHOL 144 03/27/2018    TRIG 290 (H) 03/27/2018    HDL 37 (L) 03/27/2018    LDL 83 03/27/2018    VLDL 58 03/27/2018    NONHDLCHOL 107 03/27/2018        TSH   Date Value Ref Range Status   06/27/2017 1.100 0.35 - 5.00 MCU/ML Final       Hemoglobin A1C   Date Value Ref Range Status   10/24/2015 9.3 (H) 4.0 - 6.0 % Final  Comment:     The ADA recommends that most patients with type 1 and type 2 diabetes maintain   an A1c level <7%.     10/22/2015 9.2 (H) 4.0 - 6.0 % Final     Comment:     The ADA recommends that most patients with type 1 and type 2 diabetes maintain   an A1c level <7%.     10/20/2010 5.0 4.0 - 6.0 % Final     Comment:     NOTE NEW REFERENCE RANGES  For patients with Type I or Type II Diabetes Mellitus, the ADA recommends   maintaining the A1c level <7%.     Poc Hemoglobin A1C   Date Value Ref Range Status   03/27/2018 9.2 % Final   11/03/2017 7.6 % Final   06/20/2017 7.8 % Final         Lab Results   Component Value Date    PLTCT 443 (H) 04/10/2018        Screening for Fatty liver disease:   FIB 4 Score:  NAFLD Score:         Assessment and plan:    Diabetes mellitus type 2,  uncontrolled   - Most recent A1c 9.2% - Not At Goal   - Target A1c <7% without hypoglycemia  - Currently on U500 220/160/135 with breakfast/lunch/dinner   - Complicated by: Nephropathy, poorly healing wounds  - Assessment of glycemic control: poor    Plan:  - Increase U500 dosing to 240/175/140   - Called Dexcom to see if she could get a replacement due to the bruising around the site. She can continue to wear this one unless she has any signs of infection-- reviewed these in depth-- including pain or warmth at the site. They will overnight her a replacement sensor.

## 2018-07-29 ENCOUNTER — Ambulatory Visit: Admit: 2018-07-28 | Discharge: 2018-07-29 | Payer: MEDICARE

## 2018-07-29 DIAGNOSIS — E1165 Type 2 diabetes mellitus with hyperglycemia: Principal | ICD-10-CM

## 2018-07-29 NOTE — Telephone Encounter
Faxed signed and dated Progress Notes to [place] [facility] at [phone].

## 2018-08-06 ENCOUNTER — Encounter: Admit: 2018-08-06 | Discharge: 2018-08-06 | Payer: BC Managed Care – PPO

## 2018-08-15 ENCOUNTER — Encounter: Admit: 2018-08-15 | Discharge: 2018-08-15 | Payer: BC Managed Care – PPO

## 2018-08-19 ENCOUNTER — Ambulatory Visit: Admit: 2018-08-19 | Discharge: 2018-08-19 | Payer: BC Managed Care – PPO

## 2018-08-19 DIAGNOSIS — K50019 Crohn's disease of small intestine with unspecified complications: Principal | ICD-10-CM

## 2018-08-19 DIAGNOSIS — E119 Type 2 diabetes mellitus without complications: ICD-10-CM

## 2018-08-19 DIAGNOSIS — Z794 Long term (current) use of insulin: ICD-10-CM

## 2018-09-04 ENCOUNTER — Encounter: Admit: 2018-09-04 | Discharge: 2018-09-04 | Payer: BC Managed Care – PPO

## 2018-09-07 ENCOUNTER — Encounter: Admit: 2018-09-07 | Discharge: 2018-09-07 | Payer: BC Managed Care – PPO

## 2018-09-07 NOTE — Telephone Encounter
patient by phone on 09/07/18.

## 2018-09-08 ENCOUNTER — Encounter: Admit: 2018-09-08 | Discharge: 2018-09-08 | Payer: BC Managed Care – PPO

## 2018-10-01 ENCOUNTER — Ambulatory Visit: Admit: 2018-10-01 | Discharge: 2018-10-02

## 2018-10-01 ENCOUNTER — Encounter: Admit: 2018-10-01 | Discharge: 2018-10-01

## 2018-10-01 DIAGNOSIS — IMO0002 Ulcer: Secondary | ICD-10-CM

## 2018-10-01 DIAGNOSIS — E139 Other specified diabetes mellitus without complications: Secondary | ICD-10-CM

## 2018-10-01 DIAGNOSIS — K639 Disease of intestine, unspecified: Secondary | ICD-10-CM

## 2018-10-01 DIAGNOSIS — K219 Gastro-esophageal reflux disease without esophagitis: Secondary | ICD-10-CM

## 2018-10-01 DIAGNOSIS — M359 Systemic involvement of connective tissue, unspecified: Secondary | ICD-10-CM

## 2018-10-01 DIAGNOSIS — K7581 Nonalcoholic steatohepatitis (NASH): Secondary | ICD-10-CM

## 2018-10-01 DIAGNOSIS — K5090 Crohn's disease, unspecified, without complications: Secondary | ICD-10-CM

## 2018-10-01 DIAGNOSIS — G40909 Epilepsy, unspecified, not intractable, without status epilepticus: Secondary | ICD-10-CM

## 2018-10-01 DIAGNOSIS — M8588 Other specified disorders of bone density and structure, other site: Secondary | ICD-10-CM

## 2018-10-01 DIAGNOSIS — D899 Disorder involving the immune mechanism, unspecified: Secondary | ICD-10-CM

## 2018-10-01 DIAGNOSIS — M797 Fibromyalgia: Secondary | ICD-10-CM

## 2018-10-01 DIAGNOSIS — D4959 Neoplasm of unspecified behavior of other genitourinary organ: Secondary | ICD-10-CM

## 2018-10-01 DIAGNOSIS — K5000 Crohn's disease of small intestine without complications: Principal | ICD-10-CM

## 2018-10-01 DIAGNOSIS — R739 Hyperglycemia, unspecified: Secondary | ICD-10-CM

## 2018-10-01 DIAGNOSIS — Z79899 Other long term (current) drug therapy: Secondary | ICD-10-CM

## 2018-10-01 DIAGNOSIS — D699 Hemorrhagic condition, unspecified: Secondary | ICD-10-CM

## 2018-10-01 DIAGNOSIS — J302 Other seasonal allergic rhinitis: Secondary | ICD-10-CM

## 2018-10-01 DIAGNOSIS — R42 Dizziness and giddiness: Secondary | ICD-10-CM

## 2018-10-01 DIAGNOSIS — G43909 Migraine, unspecified, not intractable, without status migrainosus: Secondary | ICD-10-CM

## 2018-10-01 DIAGNOSIS — I8290 Acute embolism and thrombosis of unspecified vein: Secondary | ICD-10-CM

## 2018-10-01 DIAGNOSIS — E119 Type 2 diabetes mellitus without complications: Secondary | ICD-10-CM

## 2018-10-01 DIAGNOSIS — I10 Essential (primary) hypertension: Secondary | ICD-10-CM

## 2018-10-01 MED ORDER — PANTOPRAZOLE 40 MG PO TBEC
40 mg | ORAL_TABLET | Freq: Two times a day (BID) | ORAL | 3 refills | 90.00000 days | Status: DC
Start: 2018-10-01 — End: 2019-03-17

## 2018-10-01 NOTE — Progress Notes
Telehealth Visit Note    Date of Service: 10/01/2018    Subjective:      Obtained patient's verbal consent to treat them and their agreement to [place] financial policy and NPP via this telehealth visit during the [facility]       Tina Snyder is a 53 y.o. female.    History of Present Illness    I visited with Tina Snyder today via telehealth for follow-up of her Crohn's disease.  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  Her most recent Crohn's resection was 2014.  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  Her most recent clinical Crohn's evaluation included EGD and colonoscopy while hospitalized in September 2019.  EGD was normal with unremarkable biopsies.  Colonoscopy showed evidence of prior ileocolonic resection.  There was some small ulcers in the neoterminal ileum biopsies showed inflammation consistent with Crohn's disease.  The remaining colon was normal.  The patient had an MR enterography in 2018 which showed no evidence of active small bowel Crohn's inflammation.  There were some postsurgical changes.  The patient has a history of osteopenia and has had 2 falls in the last year with fractures.  She follows with endocrinology.  The patient is on budesonide 9 mg daily for treatment of her Crohn's.  Endocrinology would like to have her discontinue budesonide due to her difficult to control blood sugars and osteopenia.  The patient was on Protonix for reflux but this was discontinued by her PCP a few months ago.  The patient started having more severe heartburn and regurgitation and epigastric pain after stopping Protonix.  She has restarted Protonix a few weeks ago but is still having symptoms.  She is not having a lot of Crohn's symptoms at present.  She has some alternating diarrhea and constipation, but she states this is normal for her.  She denies blood in her stool.  She was on infliximab and Humira in the past but stopped these due to mycobacterial infection.  She was on methotrexate in the past but discontinued this at the request of her orthopedic surgeon due to slow healing of her foot fracture.  Patient denies any fevers or chills or shortness of breath.       Review of Systems   Constitutional: Positive for fatigue.   HENT: Positive for congestion, postnasal drip, sinus pressure, sinus pain and sore throat.    Eyes: Negative.    Respiratory: Positive for apnea, cough and shortness of breath.    Cardiovascular: Negative.    Gastrointestinal: Positive for abdominal pain, constipation and diarrhea.   Endocrine: Positive for heat intolerance.   Genitourinary: Negative.    Musculoskeletal: Positive for myalgias.   Skin: Negative.    Allergic/Immunologic: Positive for immunocompromised state.   Neurological: Positive for dizziness.   Hematological: Bruises/bleeds easily.   Psychiatric/Behavioral: Positive for sleep disturbance. The patient is nervous/anxious.    All other systems reviewed and are negative.    Objective:         ??? ALPRAZolam (XANAX) 0.25 mg tablet Take one tablet by mouth at bedtime as needed for Anxiety.   ??? atorvastatin (LIPITOR) 40 mg tablet Take one tablet by mouth daily.   ??? blood sugar diagnostic (ONETOUCH VERIO) test strip Use 1 Strip as directed before meals and at bedtime.   ??? Blood-Glucose Sensor (DEXCOM G6 SENSOR) devi Use 1 each  as directed as directed. Change every 10 days   ??? budesonide (ENTOCORT EC) 3 mg capsule TAKE 3 CAPSULES BY MOUTH IN THE MORNING   ??? bumetanide (BUMEX) 1 mg tablet Take one tablet by mouth daily.   ??? cloNIDine (CATAPRESS) 0.1 mg tablet Take  by mouth daily.   ??? cyclobenzaprine (FLEXERIL) 10 mg tablet Take 10 mg by mouth daily as needed. Marland Kitchen   ??? DEXCOM G6 RECEIVER misc Use 1 each as directed daily. ??? DEXCOM G6 TRANSMITTER devi Use 1 each as directed as directed. Every 90 days   ??? diltiazem CD (CARDIZEM CD) 240 mg capsule Take 240 mg by mouth daily.   ??? duloxetine DR (CYMBALTA) 60 mg capsule Take 60 mg by mouth daily with breakfast.   ??? fluoxetine (PROZAC) 10 mg capsule Take 10 mg by mouth daily.   ??? HUMULIN R U-500 (CONC) KWIKPEN 500 unit/mL (3 mL) injection PEN Take 240 units before breakfast, 175 units before lunch, and 140 units before dinner. Take about 20 minutes prior to the meal   ??? hydrALAZINE (APRESOLINE) 50 mg tablet Take one-half tablet by mouth three times daily. (Patient taking differently: Take 50 mg by mouth daily.)   ??? insulin pen needles (disposable) (B-D NANO) 32 gauge x 5/32 pen needle Use one each as directed five times daily. Use with insulin injections.   ??? lancets (ONE TOUCH DELICA) MISC Use 1 Each as directed before meals and at bedtime. Diag: Diabetes mellitus (E.11)   ??? nebivolol (BYSTOLIC) 10 mg tablet Take one tablet by mouth daily.   ??? pantoprazole DR (PROTONIX) 40 mg tablet Take one tablet by mouth daily.   ??? spironolactone (ALDACTONE) 50 mg tablet Take 50 mg by mouth daily.   ??? temazepam (RESTORIL) 15 mg capsule Take 15 mg by mouth at bedtime as needed.     Vitals:    10/01/18 1111   Weight: 101.2 kg (223 lb)   Height: 162.6 cm (64.02)   PainSc: Five     Body mass index is 38.26 kg/m???.     Physical Exam  Vital signs were reviewed  Patient is not in distress  Complete physical exam was not performed due to telehealth visit only          Assessment and Plan:          Tina Snyder was seen today for crohn's disease.    Diagnoses and all orders for this visit:    Crohn's disease of small intestine without complication (HCC)    Gastroesophageal reflux disease without esophagitis  -     pantoprazole DR (PROTONIX) 40 mg tablet; Take one tablet by mouth twice daily. 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  Her reflux became more active and symptomatic after stopping Protonix.  She has restarted Protonix but is still having symptoms.  I suggested that she increase the Protonix to 40 mg twice daily for the next 8 weeks to calm down her reflux symptoms.  She can also use over-the-counter antacids, such as Gaviscon, for as needed treatment of breakthrough symptoms.  The issue with her Crohn's disease is more challenging.  Her endocrinologist would like for her to stop Entocort.  Patient is not sure if the Entocort is doing very much  and the patient does not have severe Crohn's symptoms.  It is been more than 5 years since her last resection.  Most recent assessment with endoscopy and MR enterography showed only some small ulcers in the neoterminal ileum.  I have suggested weaning off Entocort by decreasing to 6 mg daily for 1 month and then 3 mg daily for 1 month and then stopping it.  We will observe her symptoms.  If she has flaring of her Crohn's disease we will have to re-assess her treatment.  She would need to follow-up with pulmonology infectious diseases to get an opinion about her risk for starting immunosuppression.  Perhaps the best agent for her might be Entyvio as this would have less systemic immunosuppression than other biologic agents or immunomodulators.  Hopefully the patient will not have increase in her Crohn's symptoms after weaning off budesonide and we can monitor her expectantly.  We may need to reassess her Crohn's with colonoscopy and MR enterography in the future to look for disease activity off of therapy.  I will schedule  Mrs. Wilensky to follow-up in GI clinic.    Patient Instructions   1. Increase Protonix to 40 mg twice daily, take 30 minutes before breakfast and dinner 2. Can use Gaviscon as needed for breakthrough symptoms  3. Wean off Budesonide - 6 mg per day for one month, then 3 mg per day for one month, then stop  4. Observe Crohn's symptoms  5. If you have Crohn's flare, we will have to re-assess and consider other medications (maybe Entyvio)    If you have questions or concerns that need to be addressed before your next scheduled office visit, please call my nurse at [phone] or send me a MyChart patient message.       30 minutes spent on this patient's encounter with counseling and coordination of care taking >50% of the visit.

## 2018-10-02 DIAGNOSIS — A310 Pulmonary mycobacterial infection: Secondary | ICD-10-CM

## 2018-10-05 ENCOUNTER — Encounter: Admit: 2018-10-05 | Discharge: 2018-10-05

## 2018-10-05 DIAGNOSIS — K5010 Crohn's disease of large intestine without complications: Secondary | ICD-10-CM

## 2018-10-07 NOTE — Progress Notes
Obtained patient's verbal consent to treat them and their agreement to [facility] financial policy and NPP via this telehealth visit during the [facility] Emergency      Subjective:       History of Present Illness  Tina Snyder is a 53 y.o. female who was seen as a follow up for type 2 DM, she was last seen on 07/28/27 with Tina Snyder. This is my initial visit with her  Interval changes:  Reduce budesonide from 3 tabs to 2 tabs daily 2 weeks ago with plan to reduce to one tab after one month, then off after one more month. Based on dexcom download, she has has persistent hyperglycemia all day.  Best blood sugar readings are between 6-8 am.  14 days sensor June 4 to June 17, sensor used 100%, GMI 10.6, average glucose 305, SD 83, 90% high, 10% in range, 0% low.   ???    Summary of Glucose Control:?????? HgbA1c not down today but GMI from [facility] was 10.6.??? It was 9.2 on 03/27/18  ???  Type 2 DM:   Dx: 2012  Current regimen:  Insulin U 500 started 3/20 due to significantly high insulin requirement. Current dose 280/210/135   previous therapy: 299 of Tresiba and approximately 230 units of Humalog per day   Frequency of hypoglycemia none  ??? How many meals per day????3 meals with snack ??? Largest meal is   Weight changes: lost 2 pounds since last visit 223 lbs today  Exercise? no   Lipid:lipitor 40 mg daily      Family History of diabetes: none, son with celiac  Social History: lives with her husband, [place], in [place]. Does not drink or smoke.    ???  Medical History:   Diagnosis Date   ??? Autoimmune disease (HCC)     Chron's   ??? Bleeding tendency (HCC)    ??? Blood clot in vein     subclavian   ??? Bowel disease    ??? Crohn disease (HCC)    ??? Dizziness    ??? DM (diabetes mellitus), secondary-steroid-induced 2012   ??? Fibromyalgia    ??? GERD (gastroesophageal reflux disease)    ??? Hypertension    ??? Hypertensive heart disease    ??? Migraines    ??? Seasonal allergic reaction    ??? Seizure disorder (HCC)    ??? Steatohepatitis felt secondary to medications   ??? Type 2 diabetes mellitus without complication, with long-term current use of insulin (HCC) 10/22/2017   ??? Ulcer    ??? Vaginal tumor          Review of Systems  Constitutional:   Fatigue: yes  Pain: yes  Skin:   Heat or cold intolerance : no  Dry skin : no  Flushing episodes : no  Hair loss : no  Excessive body or facial hair: no  Head   Headaches: no  Double or blurry vision : no  Difficulty swallowing or choking : no  Feeling of food stuck: no  Neck enlargement or lumps : no  Head or chest radiation exposure : no  Decreased hearing : no  Chest/Heart   Nipple discharge : no  Chest pain or pressure: no  Heartburn: no  Shortness of breath: no  Heart racing or pounding : no  Digestion   Abdominal or belly pain : yes  Constipation : no  Diarrhea : yes  Nausea and vomiting: no  Reflux : no  Urinary  Kidney stones: no   Night time urination : no  Skeleton   Muscle cramping: no  Glands   Weight loss : no  Weight gain : no  Nerves   Nervous or anxious : no  Seizures or falls : no  Dizziness : no  numbenss or tingling : no  Mental Health   Do you feel sad? : no  Sleep problems : no        Objective:         ??? ALPRAZolam (XANAX) 0.25 mg tablet Take one tablet by mouth at bedtime as needed for Anxiety.   ??? atorvastatin (LIPITOR) 40 mg tablet Take one tablet by mouth daily.   ??? blood sugar diagnostic (ONETOUCH VERIO) test strip Use 1 Strip as directed before meals and at bedtime.   ??? Blood-Glucose Sensor (DEXCOM G6 SENSOR) devi Use 1 each as directed as directed. Change every 10 days   ??? budesonide (ENTOCORT EC) 3 mg capsule TAKE 3 CAPSULES BY MOUTH IN THE MORNING   ??? bumetanide (BUMEX) 1 mg tablet Take one tablet by mouth daily.   ??? cloNIDine (CATAPRESS) 0.1 mg tablet Take  by mouth daily.   ??? cyclobenzaprine (FLEXERIL) 10 mg tablet Take 10 mg by mouth daily as needed. Marland Kitchen   ??? DEXCOM G6 RECEIVER misc Use 1 each as directed daily. ??? DEXCOM G6 TRANSMITTER devi Use 1 each as directed as directed. Every 90 days   ??? diltiazem CD (CARDIZEM CD) 240 mg capsule Take 240 mg by mouth daily.   ??? duloxetine DR (CYMBALTA) 60 mg capsule Take 60 mg by mouth daily with breakfast.   ??? fluoxetine (PROZAC) 10 mg capsule Take 10 mg by mouth daily.   ??? HUMULIN R U-500 (CONC) KWIKPEN 500 unit/mL (3 mL) injection PEN Take 240 units before breakfast, 175 units before lunch, and 140 units before dinner. Take about 20 minutes prior to the meal   ??? hydrALAZINE (APRESOLINE) 50 mg tablet Take one-half tablet by mouth three times daily. (Patient taking differently: Take 50 mg by mouth daily.)   ??? insulin pen needles (disposable) (B-D NANO) 32 gauge x 5/32 pen needle Use one each as directed five times daily. Use with insulin injections.   ??? lancets (ONE TOUCH DELICA) MISC Use 1 Each as directed before meals and at bedtime. Diag: Diabetes mellitus (E.11)   ??? nebivolol (BYSTOLIC) 10 mg tablet Take one tablet by mouth daily.   ??? pantoprazole DR (PROTONIX) 40 mg tablet Take one tablet by mouth twice daily. Indications: gastroesophageal reflux disease   ??? spironolactone (ALDACTONE) 50 mg tablet Take 50 mg by mouth daily.   ??? temazepam (RESTORIL) 15 mg capsule Take 15 mg by mouth at bedtime as needed.     Vitals:    10/09/18 0903   Weight: 101.2 kg (223 lb)   Height: 162.6 cm (64.02)   PainSc: Six     Body mass index is 38.26 kg/m???.     Physical Exam  General Appearance: moderately overweight, no distress   Respiratory Effort: breathing comfortably, no respiratory distress   Muscle Strength: normal muscle tone   Orientation: oriented to time, place and person   Affect & Mood: appropriate and sustained affect   Language and Memory: patient responsive and seems to comprehend information   Neurologic Exam: neurological assessment grossly intact   Other: moves all extremities, No tremor         Assessment and Plan: Diabetes mellitus type 2,  uncontrolled with significant  insulin resistant  ??? Most recent A1c 9.2% - Not At Goal   ??? Target A1c <7% without hypoglycemia  ??? Currently on U500  280/210/135 with breakfast/lunch/dinner   ??? Complicated by: Nephropathy, poorly healing wounds  ??? Assessment of glycemic control: poor  ???  Plan:  ??? Continue U500 dosing 280/210/135 but will start modify the three doses based on dexcom arrow prior to each meal and modify insulin based on Dexcom trend arrow for slant  or 1 arrow up, add on 5 more units to the dose, for 2 arrows up add on 10 more units to the dose, on the other hand, if  slant down or 1 narrow down, decrease dose by 10 units, for 2 arrows up, decrease dose by 20 units.   Budesonide is likely contributing to her extreme insulin resistance and hope glycemic control improve with dose taper  ??? We have been avoiding SGLT2/GLP-1 in this patient due to recurrent yeast infections as well as uncontrolled A1C   ??? Discussed rule of 15 and how to tx hypoglycemia  ??? Reminded pt to rotate insulin sites  ??? Reminded pt to have medical alert bracelet  ??? Ash her to give [place] an update prior to  colonoscopy so we can recommend dose adjustment for colonoscopy based on how her glucose trend    ???  Diabetic complications - prevention and management:  - Annual labs (electrolytes and renal function): Last done 03/2018  - Annual eye exam: Last done 04/2018.  Retinopathy - none  - Unable to check BP today, but last BP in March was at goal.   - Hyperlipidemia: hypertriglyceridemia. Lipid panel: Last done 03/2018. Likely due to hyperglycemia         - Statin: yes         - Discussed dietary modifications (low cholesterol)  - Nephropathy: yes, CKD 3. Annual Urine microalbumin/Cr: 03/2018-- WNL  - Foot exam / monofilament exam (recommended annually): Last done 03/2018         - Discussed foot care. No issues at this time  - Diabetic Educator and/or nutritionist visit (recommended annually): following with Judithann Sheen    RTC in 3 mo    Total time reviewing chart and visit 40 minutes.  Estimated counseling time 15 minutes.

## 2018-10-09 ENCOUNTER — Encounter: Admit: 2018-10-09 | Discharge: 2018-10-09

## 2018-10-09 DIAGNOSIS — K639 Disease of intestine, unspecified: Secondary | ICD-10-CM

## 2018-10-09 DIAGNOSIS — M797 Fibromyalgia: Secondary | ICD-10-CM

## 2018-10-09 DIAGNOSIS — E139 Other specified diabetes mellitus without complications: Secondary | ICD-10-CM

## 2018-10-09 DIAGNOSIS — D699 Hemorrhagic condition, unspecified: Secondary | ICD-10-CM

## 2018-10-09 DIAGNOSIS — G43909 Migraine, unspecified, not intractable, without status migrainosus: Secondary | ICD-10-CM

## 2018-10-09 DIAGNOSIS — E8881 Metabolic syndrome: Secondary | ICD-10-CM

## 2018-10-09 DIAGNOSIS — K5090 Crohn's disease, unspecified, without complications: Secondary | ICD-10-CM

## 2018-10-09 DIAGNOSIS — D4959 Neoplasm of unspecified behavior of other genitourinary organ: Secondary | ICD-10-CM

## 2018-10-09 DIAGNOSIS — I10 Essential (primary) hypertension: Secondary | ICD-10-CM

## 2018-10-09 DIAGNOSIS — J302 Other seasonal allergic rhinitis: Secondary | ICD-10-CM

## 2018-10-09 DIAGNOSIS — IMO0002 Ulcer: Secondary | ICD-10-CM

## 2018-10-09 DIAGNOSIS — R42 Dizziness and giddiness: Secondary | ICD-10-CM

## 2018-10-09 DIAGNOSIS — K219 Gastro-esophageal reflux disease without esophagitis: Secondary | ICD-10-CM

## 2018-10-09 DIAGNOSIS — I8290 Acute embolism and thrombosis of unspecified vein: Secondary | ICD-10-CM

## 2018-10-09 DIAGNOSIS — K7581 Nonalcoholic steatohepatitis (NASH): Secondary | ICD-10-CM

## 2018-10-09 DIAGNOSIS — E119 Type 2 diabetes mellitus without complications: Secondary | ICD-10-CM

## 2018-10-09 DIAGNOSIS — G40909 Epilepsy, unspecified, not intractable, without status epilepticus: Secondary | ICD-10-CM

## 2018-10-09 DIAGNOSIS — M359 Systemic involvement of connective tissue, unspecified: Secondary | ICD-10-CM

## 2018-10-10 ENCOUNTER — Ambulatory Visit: Admit: 2018-10-09 | Discharge: 2018-10-10

## 2018-10-10 DIAGNOSIS — Z794 Long term (current) use of insulin: Secondary | ICD-10-CM

## 2018-10-10 DIAGNOSIS — E119 Type 2 diabetes mellitus without complications: Secondary | ICD-10-CM

## 2018-10-10 DIAGNOSIS — E785 Hyperlipidemia, unspecified: Secondary | ICD-10-CM

## 2018-10-10 NOTE — Procedures
Continuous Glucose Monitoring Analysis and Interpretation    Indication for Device Placement: Blood sugar values not well controlled as evidenced by high A1C    Name/Type of Device Placed: Dexcom G5/G6    Date of Print out: 10/07/18    Continuous glucose monitor downloaded and personally reviewed by me on 10/07/18.    Analysis of Data    Date Range: 09/24/18 - 10/07/18    Sensor usage time (%): 100    Average glucose (mg/dL): 305    Coefficient of Variation (%): 27.1    Standard Deviation (mg/dL): 83    Time in range (%):  9.8    Time above range (%): 90.2   Time above 243m/dL (%): 71.3    Time below range (%): 0   Time below 595mdL (%): 0    Assessment  Trends/Patterns observed  ? Hyperglycemia: all day, best in AM  ? Hypoglycemia: none    Plan  Treatment changes:   ? Continue current U500 280/210/135  ? Correction factor insulin: add on or subtract dose based on dexcom arrow tend before each bolus  Review of readings/tracings and any changes to plan of care discussed with patient during visit 10/09/18.

## 2018-10-14 ENCOUNTER — Encounter: Admit: 2018-10-14 | Discharge: 2018-10-14

## 2018-10-14 DIAGNOSIS — K50018 Crohn's disease of small intestine with other complication: Secondary | ICD-10-CM

## 2018-10-14 MED ORDER — BUDESONIDE 3 MG PO CECX
ORAL_CAPSULE | ORAL | 1 refills | 30.00000 days | Status: DC
Start: 2018-10-14 — End: 2019-02-17

## 2018-10-15 ENCOUNTER — Encounter: Admit: 2018-10-15 | Discharge: 2018-10-15

## 2018-10-15 DIAGNOSIS — E1165 Type 2 diabetes mellitus with hyperglycemia: Secondary | ICD-10-CM

## 2018-10-15 MED ORDER — HUMULIN R U-500 (CONC) KWIKPEN 500 UNIT/ML (3 ML) SC INPN
Freq: Every morning | SUBCUTANEOUS | 3 refills | 32.00000 days | Status: DC
Start: 2018-10-15 — End: 2019-03-05

## 2018-10-21 ENCOUNTER — Encounter: Admit: 2018-10-21 | Discharge: 2018-10-21

## 2018-10-21 DIAGNOSIS — G629 Polyneuropathy, unspecified: Secondary | ICD-10-CM

## 2018-10-28 ENCOUNTER — Encounter: Admit: 2018-10-28 | Discharge: 2018-10-28

## 2018-10-29 ENCOUNTER — Encounter: Admit: 2018-10-29 | Discharge: 2018-10-29

## 2018-10-29 ENCOUNTER — Ambulatory Visit: Admit: 2018-10-29 | Discharge: 2018-10-30

## 2018-10-29 DIAGNOSIS — I890 Lymphedema, not elsewhere classified: Secondary | ICD-10-CM

## 2018-10-29 DIAGNOSIS — I10 Essential (primary) hypertension: Secondary | ICD-10-CM

## 2018-10-29 DIAGNOSIS — R42 Dizziness and giddiness: Secondary | ICD-10-CM

## 2018-10-29 DIAGNOSIS — D4959 Neoplasm of unspecified behavior of other genitourinary organ: Secondary | ICD-10-CM

## 2018-10-29 DIAGNOSIS — E139 Other specified diabetes mellitus without complications: Secondary | ICD-10-CM

## 2018-10-29 DIAGNOSIS — G40909 Epilepsy, unspecified, not intractable, without status epilepticus: Secondary | ICD-10-CM

## 2018-10-29 DIAGNOSIS — K7581 Nonalcoholic steatohepatitis (NASH): Secondary | ICD-10-CM

## 2018-10-29 DIAGNOSIS — J479 Bronchiectasis, uncomplicated: Secondary | ICD-10-CM

## 2018-10-29 DIAGNOSIS — I82B12 Acute embolism and thrombosis of left subclavian vein: Secondary | ICD-10-CM

## 2018-10-29 DIAGNOSIS — M359 Systemic involvement of connective tissue, unspecified: Secondary | ICD-10-CM

## 2018-10-29 DIAGNOSIS — I8290 Acute embolism and thrombosis of unspecified vein: Secondary | ICD-10-CM

## 2018-10-29 DIAGNOSIS — K639 Disease of intestine, unspecified: Secondary | ICD-10-CM

## 2018-10-29 DIAGNOSIS — K5090 Crohn's disease, unspecified, without complications: Secondary | ICD-10-CM

## 2018-10-29 DIAGNOSIS — D699 Hemorrhagic condition, unspecified: Secondary | ICD-10-CM

## 2018-10-29 DIAGNOSIS — E7800 Pure hypercholesterolemia, unspecified: Secondary | ICD-10-CM

## 2018-10-29 DIAGNOSIS — M797 Fibromyalgia: Secondary | ICD-10-CM

## 2018-10-29 DIAGNOSIS — J302 Other seasonal allergic rhinitis: Secondary | ICD-10-CM

## 2018-10-29 DIAGNOSIS — N183 Chronic kidney disease, stage 3 (moderate): Secondary | ICD-10-CM

## 2018-10-29 DIAGNOSIS — G4733 Obstructive sleep apnea (adult) (pediatric): Secondary | ICD-10-CM

## 2018-10-29 DIAGNOSIS — R609 Edema, unspecified: Secondary | ICD-10-CM

## 2018-10-29 DIAGNOSIS — K219 Gastro-esophageal reflux disease without esophagitis: Secondary | ICD-10-CM

## 2018-10-29 DIAGNOSIS — IMO0002 Ulcer: Secondary | ICD-10-CM

## 2018-10-29 DIAGNOSIS — G43909 Migraine, unspecified, not intractable, without status migrainosus: Secondary | ICD-10-CM

## 2018-10-29 DIAGNOSIS — E119 Type 2 diabetes mellitus without complications: Secondary | ICD-10-CM

## 2018-10-29 MED ORDER — BUMETANIDE 2 MG PO TAB
2 mg | ORAL_TABLET | Freq: Every day | ORAL | 0 refills | Status: DC
Start: 2018-10-29 — End: 2018-11-16

## 2018-10-29 NOTE — Telephone Encounter
Trish ,     Can you please fax the PT for lymphedema order to the info patient has provided. Her bumex has been increased to 2 mgs and she will be getting her blood work done Architectural technologist. Thanks

## 2018-10-29 NOTE — Telephone Encounter
Referral placed and faxed. Thanks! Clayton Lefort, RN

## 2018-10-29 NOTE — Progress Notes
Date of Service: 10/29/2018  Obtained patient's verbal consent to treat them and their agreement to [facility] financial policy and NPP via this telehealth visit during the [facility] Emergency    Subjective:             Tina Snyder is a 53 y.o. female.    History of Present Illness   Tina Snyder is a 53 year old female with a chronic lymphedema with worsening swelling of her lower extremities while being on 1 mg of bumex   She is reporting right arm and left leg are  worse and does have an upcoming appointment with Dr. Annia Belt.  Previous medical records have been reviewed.  She denied having any chest pain chest discomfort.  She is also wanting a referral to be sent to the lymphedema clinic.    Peripheral edema:  Progressive edema in her bilateral arms, underarms and chest wall and additionally in her feet. Started in LUE predominantly. Noted in RUE after a prolonged hospital stay in 2019 where she lay on her right side.  She has been on lasix since 2018.  She has a history of subclavian thrombus with chest wall collaterals in addition to intermittent steroids.  Hands are getting thicker, had to change ring size.  Ruddy skin complexion is new.   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 Upper >> lower extremity. 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 Hollie Beach does not feel this is responsible for her edema.  She has had significant improvement with lymphedema therapy and will continue to do so.    > Lymphoscintigraphy  consider  > RF, anti-CCP ordered not yet drawn  > continue bumex prn  > continue OT  > BMP on bumex  She has chronic left innominate vein narrowing.  There was no other finding to suggest cause of her generalized swelling.  She may need other systemic evaluation to help determine the cause of her generalized swelling.      Medical History:   Diagnosis Date   ??? Autoimmune disease (HCC)     Chron's ??? Bleeding tendency (HCC)    ??? Blood clot in vein     subclavian   ??? Bowel disease    ??? Crohn disease (HCC)    ??? Dizziness    ??? DM (diabetes mellitus), secondary-steroid-induced 2012   ??? Fibromyalgia    ??? GERD (gastroesophageal reflux disease)    ??? Hypertension    ??? Hypertensive heart disease    ??? Migraines    ??? Seasonal allergic reaction    ??? Seizure disorder (HCC)    ??? Steatohepatitis     felt secondary to medications   ??? Type 2 diabetes mellitus without complication, with long-term current use of insulin (HCC) 10/22/2017   ??? Ulcer    ??? Vaginal tumor        Surgical History:   Procedure Laterality Date   ??? HX ADENOIDECTOMY  05/1995   ??? LUNG SURGERY  10/22/2010    lung biopsy   ??? LIVER BIOPSY  12/2010   ??? ESOPHAGOGASTRODUODENOSCOPY N/A 03/13/2015    Performed by Elyn Aquas, MD at [facility] ENDO   ??? COLONOSCOPY N/A 03/13/2015    Performed by Elyn Aquas, MD at [facility] ENDO   ??? ESOPHAGOGASTRODUODENOSCOPY BIOPSY  03/13/2015    Performed by Elyn Aquas, MD at [facility] ENDO   ??? COLONOSCOPY BIOPSY  03/13/2015    Performed by Elyn Aquas, MD  at [facility] ENDO   ??? ESOPHAGOGASTRODUODENOSCOPY WITH SPECIMEN COLLECTION BY BRUSHING/ WASHING N/A 06/30/2017    Performed by Jolee Ewing, MD at [facility] ENDO   ??? COLONOSCOPY DIAGNOSTIC WITH SPECIMEN COLLECTION BY BRUSHING/ WASHING - FLEXIBLE N/A 06/30/2017    Performed by Jolee Ewing, MD at [facility] ENDO   ??? ESOPHAGOGASTRODUODENOSCOPY WITH BIOPSY - FLEXIBLE N/A 06/30/2017    Performed by Jolee Ewing, MD at [facility] ENDO   ??? COLONOSCOPY WITH BIOPSY - FLEXIBLE  06/30/2017    Performed by Jolee Ewing, MD at [facility] ENDO   ??? APPENDECTOMY     ??? BRONCHOSCOPY     ??? CHOLECYSTECTOMY     ??? GASTRIC FUNDOPLICATION     ??? HEMORRHOIDECTOMY     ??? HX BREAST LUMPECTOMY     ??? HX ENDOSCOPY      colonoscopy 2014   ??? HX HYSTERECTOMY      partial 02/1995, ovaries removed 1999   ??? OOPHORECTOMY      1999 ovaries removed   ??? SMALL BOWEL RESECTION     ??? SUBTOTAL COLECTOMY      Ileocolectomy        Family History Problem Relation Age of Onset   ??? Celiac Disease Son    ??? Asthma Son    ??? Hypertension Mother    ??? Depression Mother    ??? Basal Cell Carcinoma Mother    ??? Hypertension Father    ??? High Cholesterol Father    ??? Depression Father    ??? Hypertension Maternal Grandmother    ??? Stroke Maternal Grandmother    ??? Heart Failure Maternal Grandfather    ??? Stroke Maternal Grandfather    ??? Migraines Maternal Grandfather    ??? Cancer Paternal Grandmother         breast   ??? Hypertension Paternal Grandmother    ??? High Cholesterol Paternal Grandmother    ??? Depression Paternal Grandmother    ??? Heart Failure Paternal Grandfather    ??? Hypertension Paternal Grandfather    ??? Blood Clots Neg Hx    ??? COPD Neg Hx    ??? Coronary Artery Disease Neg Hx    ??? Cystic Fibrosis Neg Hx    ??? DVT Neg Hx    ??? Pulmonary Embolism Neg Hx    ??? Pulmonary Fibrosis Neg Hx    ??? Pulmonary HTN Neg Hx    ??? Melanoma Neg Hx        Social History     Socioeconomic History   ??? Marital status: Married     Spouse name: Matt   ??? Number of children: 2   ??? Years of education: Not on file   ??? Highest education level: Not on file   Occupational History   ??? 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 Smoking status: Never Smoker   ??? Smokeless tobacco: Never Used   Substance and Sexual Activity   ??? Alcohol use: No     Alcohol/week: 0.0 standard drinks   ??? Drug use: No   ??? Sexual activity: Not on file   Other Topics Concern   ??? 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  Review of Systems  As per HPI otherwise 10 point review systems is negative.    Objective:         ??? ALPRAZolam (XANAX) 0.25 mg tablet Take one tablet by mouth at bedtime as needed for Anxiety. ??? atorvastatin (LIPITOR) 40 mg tablet Take one tablet by mouth daily.   ??? blood sugar diagnostic (ONETOUCH VERIO) test strip Use 1 Strip as directed before meals and at bedtime.   ??? Blood-Glucose Sensor (DEXCOM G6 SENSOR) devi Use 1 each as directed as directed. Change every 10 days   ??? budesonide (ENTOCORT EC) 3 mg capsule TAKE 3 CAPSULES BY MOUTH IN THE MORNING   ??? bumetanide (BUMEX) 1 mg tablet Take one tablet by mouth daily.   ??? cloNIDine (CATAPRESS) 0.1 mg tablet Take  by mouth daily.   ??? cyclobenzaprine (FLEXERIL) 10 mg tablet Take 10 mg by mouth daily as needed. Marland Kitchen   ??? DEXCOM G6 RECEIVER misc Use 1 each as directed daily.   ??? DEXCOM G6 TRANSMITTER devi Use 1 each as directed as directed. Every 90 days   ??? diltiazem CD (CARDIZEM CD) 240 mg capsule Take 240 mg by mouth daily.   ??? duloxetine DR (CYMBALTA) 60 mg capsule Take 60 mg by mouth daily with breakfast.   ??? fluoxetine (PROZAC) 10 mg capsule Take 10 mg by mouth daily.   ??? HUMULIN R U-500 (CONC) KWIKPEN 500 unit/mL (3 mL) injection PEN Take 280 units before breakfast, 210 units before lunch, and 135 units before dinner. Take about 20 minutes prior to the meal   ??? hydrALAZINE (APRESOLINE) 50 mg tablet Take one-half tablet by mouth three times daily. (Patient taking differently: Take 50 mg by mouth daily.)   ??? insulin pen needles (disposable) (B-D NANO) 32 gauge x 5/32 pen needle Use one each as directed five times daily. Use with insulin injections.   ??? lancets (ONE TOUCH DELICA) MISC Use 1 Each as directed before meals and at bedtime. Diag: Diabetes mellitus (E.11)   ??? nebivolol (BYSTOLIC) 10 mg tablet Take one tablet by mouth daily.   ??? pantoprazole DR (PROTONIX) 40 mg tablet Take one tablet by mouth twice daily. Indications: gastroesophageal reflux disease   ??? spironolactone (ALDACTONE) 50 mg tablet Take 50 mg by mouth daily.   ??? temazepam (RESTORIL) 15 mg capsule Take 15 mg by mouth at bedtime as needed.     Vitals:    10/29/18 1349 Weight: 101.2 kg (223 lb)   Height: 162.6 cm (64.02)   PainSc: Seven     Body mass index is 38.26 kg/m???.     Physical Exam  Does not appear to be in acute distress.  No respiratory distress and breathing normally.  Mood was normal.         Assessment and Plan:  Problem   Morbid (Severe) Obesity Due to Excess Calories (Hcc)   Bronchiectasis Without Complication (Hcc)   Essential (Hemorrhagic) Thrombocythemia (Hcc)   Chronic lymphedema of lower extremities with increased swelling Bumex has been increased to 2 mg and she has been instructed to get the blood work done and follow-up with Dr. Annia Belt as previously scheduled.  She voiced understanding and referral to lymphedema therapy has been placed.    Other chronic medical issues including hypertension hyperlipidemia has been reviewed and no medication changes have been made.         Patient Instructions     Routine Clinic Information:     Please don't hesitate to call if you  have any problems or questions. Sam can be reached at [phone].     You may also message [place] in MyChart.     For refills on medications, please have your pharmacy fax a refill authorization request form to our office at Fax) [phone].     Please allow at least 3 business days for refill requests.     For urgent issues after business hours/weekends/holidays call [phone] and request for the outpatient internal medicine physician to be paged .    We offer same day appointments for your acute health concerns. These appointments are on a first come, first serve basis.     Please call [phone] if you would like to make an appointment. If I am not available, you can see any of my partners or try to see me the next day (call at 8am).       Orders Placed This Encounter   ??? COMPREHENSIVE METABOLIC PANEL   ??? bumetanide (BUMEX) 2 mg tablet         Take care,    Dr.Chiamaka Latka & Sam

## 2018-10-30 ENCOUNTER — Ambulatory Visit: Admit: 2018-10-30 | Discharge: 2018-10-30

## 2018-10-30 ENCOUNTER — Encounter: Admit: 2018-10-30 | Discharge: 2018-10-30

## 2018-10-30 DIAGNOSIS — E559 Vitamin D deficiency, unspecified: Secondary | ICD-10-CM

## 2018-10-30 DIAGNOSIS — R740 Nonspecific elevation of levels of transaminase and lactic acid dehydrogenase [LDH]: Secondary | ICD-10-CM

## 2018-10-30 DIAGNOSIS — N183 Chronic kidney disease, stage 3 (moderate): Secondary | ICD-10-CM

## 2018-10-30 DIAGNOSIS — E119 Type 2 diabetes mellitus without complications: Secondary | ICD-10-CM

## 2018-10-30 DIAGNOSIS — D473 Essential (hemorrhagic) thrombocythemia: Secondary | ICD-10-CM

## 2018-10-30 DIAGNOSIS — Z794 Long term (current) use of insulin: Secondary | ICD-10-CM

## 2018-10-30 DIAGNOSIS — I87009 Postthrombotic syndrome without complications of unspecified extremity: Secondary | ICD-10-CM

## 2018-10-30 DIAGNOSIS — E7800 Pure hypercholesterolemia, unspecified: Secondary | ICD-10-CM

## 2018-10-30 DIAGNOSIS — Z79899 Other long term (current) drug therapy: Secondary | ICD-10-CM

## 2018-10-30 DIAGNOSIS — R609 Edema, unspecified: Secondary | ICD-10-CM

## 2018-10-30 LAB — COMPREHENSIVE METABOLIC PANEL
Lab: 1.9 mg/dL — ABNORMAL HIGH (ref 0.4–1.00)
Lab: 117 U/L — ABNORMAL HIGH (ref 25–110)
Lab: 135 MMOL/L — ABNORMAL LOW (ref 137–147)
Lab: 283 mg/dL — ABNORMAL HIGH (ref 70–100)
Lab: 4.3 MMOL/L — ABNORMAL LOW (ref 3.5–5.1)
Lab: 6.7 g/dL (ref 6.0–8.0)
Lab: 9.5 mg/dL (ref 8.5–10.6)

## 2018-10-30 LAB — CCP IGG ANTIBODY

## 2018-10-30 LAB — LIPID PROFILE
Lab: 100 mg/dL
Lab: 135 mg/dL — ABNORMAL LOW (ref <200)
Lab: 35 mg/dL — ABNORMAL LOW (ref >40)
Lab: 75 mg/dL (ref <100)

## 2018-10-30 LAB — RHEUMATOID FACTOR (RF): Lab: <10 [IU]/mL — ABNORMAL HIGH (ref <25)

## 2018-10-30 LAB — TSH WITH FREE T4 REFLEX: Lab: 1.8 uU/mL — ABNORMAL HIGH (ref 0.35–5.00)

## 2018-10-30 LAB — CREATINE KINASE-CPK: Lab: 38 U/L (ref 21–215)

## 2018-11-03 ENCOUNTER — Encounter: Admit: 2018-11-03 | Discharge: 2018-11-03

## 2018-11-03 DIAGNOSIS — D4959 Neoplasm of unspecified behavior of other genitourinary organ: Secondary | ICD-10-CM

## 2018-11-03 DIAGNOSIS — K50019 Crohn's disease of small intestine with unspecified complications: Secondary | ICD-10-CM

## 2018-11-03 DIAGNOSIS — M797 Fibromyalgia: Secondary | ICD-10-CM

## 2018-11-03 DIAGNOSIS — D699 Hemorrhagic condition, unspecified: Secondary | ICD-10-CM

## 2018-11-03 DIAGNOSIS — M359 Systemic involvement of connective tissue, unspecified: Secondary | ICD-10-CM

## 2018-11-03 DIAGNOSIS — G43909 Migraine, unspecified, not intractable, without status migrainosus: Secondary | ICD-10-CM

## 2018-11-03 DIAGNOSIS — I8290 Acute embolism and thrombosis of unspecified vein: Secondary | ICD-10-CM

## 2018-11-03 DIAGNOSIS — E119 Type 2 diabetes mellitus without complications: Secondary | ICD-10-CM

## 2018-11-03 DIAGNOSIS — F419 Anxiety disorder, unspecified: Secondary | ICD-10-CM

## 2018-11-03 DIAGNOSIS — I87009 Postthrombotic syndrome without complications of unspecified extremity: Secondary | ICD-10-CM

## 2018-11-03 DIAGNOSIS — G40909 Epilepsy, unspecified, not intractable, without status epilepticus: Secondary | ICD-10-CM

## 2018-11-03 DIAGNOSIS — N183 Chronic kidney disease, stage 3 (moderate): Secondary | ICD-10-CM

## 2018-11-03 DIAGNOSIS — K7581 Nonalcoholic steatohepatitis (NASH): Secondary | ICD-10-CM

## 2018-11-03 DIAGNOSIS — I10 Essential (primary) hypertension: Secondary | ICD-10-CM

## 2018-11-03 DIAGNOSIS — IMO0002 Ulcer: Secondary | ICD-10-CM

## 2018-11-03 DIAGNOSIS — J302 Other seasonal allergic rhinitis: Secondary | ICD-10-CM

## 2018-11-03 DIAGNOSIS — N179 Acute kidney failure, unspecified: Secondary | ICD-10-CM

## 2018-11-03 DIAGNOSIS — E139 Other specified diabetes mellitus without complications: Secondary | ICD-10-CM

## 2018-11-03 DIAGNOSIS — K5090 Crohn's disease, unspecified, without complications: Secondary | ICD-10-CM

## 2018-11-03 DIAGNOSIS — K639 Disease of intestine, unspecified: Secondary | ICD-10-CM

## 2018-11-03 DIAGNOSIS — K219 Gastro-esophageal reflux disease without esophagitis: Secondary | ICD-10-CM

## 2018-11-03 DIAGNOSIS — R42 Dizziness and giddiness: Secondary | ICD-10-CM

## 2018-11-03 MED ORDER — ALPRAZOLAM 0.25 MG PO TAB
.25 mg | ORAL_TABLET | Freq: Every evening | ORAL | 0 refills | Status: DC | PRN
Start: 2018-11-03 — End: 2018-11-30

## 2018-11-03 MED ORDER — BUPROPION SR 150 MG PO TBSR
150 mg | ORAL_TABLET | Freq: Every day | ORAL | 3 refills | Status: DC
Start: 2018-11-03 — End: 2019-03-17

## 2018-11-03 NOTE — Progress Notes
Tina Snyder is a 53 y.o. female.    INTERVAL hx  Tina Snyder saw Dr Renard Matter for worsening peripheral edema.  Tina Snyder had her RF, anti CCP drawn which were negative. Her bumex was increased for management of the worsening swelling.  Her fu labs showed mild AKI.  Tina Snyder took her normal blood bumex dose this AM.     Her glucose was elevated as well on the blood work. Tina Snyder is working very closely with her     Tina Snyder is weaning off the budesonide. Tina Snyder has a colonoscopy next week.     Tina Snyder is having horrible acid reflux. Dr. Janene Madeira increased her protonox bid.     Tina Snyder has swollen lymph nodes. This tends to happen when Tina Snyder has elevated.     Tina Snyder is having dizzy spells again.  This has occurred several years ago.      Depression is worst. Total apathy.  Could sleep all day.  Has a history of suicidality in her teens. Tina Snyder talks to her pastor. Excellent support.     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 Tina Snyder was briefly on coumadin but stopped in setting of recurrent bleeding. 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 Recent CT imaging showed a lot of chest wall collaterals.  Pt not aware.  Not on any therapy.  [place] of her left arm showed no DVT. Tina Snyder had a negative VQ scan for PE. Tina Snyder had a CT chest 2016 Development of anterior superior mediastinal vascular collaterals secondary to marked narrowing or occlusion of the left brachiocephalic vein. 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  Tina Snyder had a CTA chest in 2019 with Dr. Hollie Beach and left brachiocephalic vein atresia noted with prominent collaterals. Dr. Gaspar Bidding felt Tina Snyder may need other systemic evaluation to help determine the cause of her swelling.  Her anti CCP and RF were negative in 2020.    Peripheral edema:  Progressive edema in her bilateral arms, underarms and chest wall and additionally in her feet. Started in LUE predominantly. Noted in RUE after a prolonged hospital stay in 2019 where Tina Snyder lay on her right side.  Tina Snyder has been on lasix since 2018.  Tina Snyder has a history of subclavian thrombus with chest wall collaterals in addition to intermittent steroids.  Hands are getting thicker, had to change ring size.  Ruddy skin complexion is new.     History of Iron Deficiency Anemia  Resolved with IV iron.    History of Syncope:  Tina Snyder has seen cardiology. Diltiazem held due to orthostatic concerns. Tina Snyder had a TTE on 07/02/2017 which showed EF 70%.  Carotid u/s was unremarkable. Tina Snyder had an EEG on 06/05/2017 which was fine.   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 Chronic NSAID use considered.     Crohns  [place], GI  Budesonide 3mg   Prior: mtx (fracture), entecort    MAI:   [place], Pulmonary.   Tina Snyder has been on three times weekly azithromycin in the past.     Diabetes Management:  Current regimen:Novolog/glargine changing to U500, not yet  Endocrine:  Yes We have been avoiding SGLT2/GLP-1 in this patient due to recurrent yeast infections as well as Crohn's disease.  Complications:  Nephropathy, Gastropathy/Delayed gastric emptying, CAD, PVD  Diet adherent: most of the time  Medication adherent: all of the time  Patient is consistent  with home glucose monitoring: Yes  The patient has not had hypoglycemic reactions  Microalbumin tested in last 12 months?  No   Eye exam within the last 12 months? No  Comprehensive Foot exam within the last 12 months? No  Pneumonia shot current? No  The patient is taking a daily aspirin:No  The patient is taking an ACE inhibitor or an ARB:No   The patient is taking a statin:Yes   Poc Hemoglobin A1C   Date Value Ref Range Status   03/27/2018 9.2 % Final   11/03/2017 7.6 % Final   06/20/2017 7.8 % Final     Lab Results   Component Value Date/Time    HGBA1C 9.3 (H) 10/24/2015 03:50 AM    HGBA1C 9.2 (H) 10/22/2015 04:11 AM HGBA1C 5.0 10/20/2010 01:20 AM    A1C 9.2 03/27/2018 09:47 AM    CHOL 135 10/30/2018 11:39 AM    TRIG 272 (H) 10/30/2018 11:39 AM    HDL 35 (L) 10/30/2018 11:39 AM    LDL 75 10/30/2018 11:39 AM    VLDL 54 10/30/2018 11:39 AM    NONHDLCHOL 100 10/30/2018 11:39 AM    CR 1.95 (H) 10/30/2018 11:39 AM    MCALBR 7.1 03/27/2018 11:11 AM    Imp: Diabetes unknown     Hypertension Management:    Current regimen: bumex 1mg , hydralazine 25mg  three times a day, bystolic 10mg  daily, diltiazem 240mg  daily, spironolactone  CVD Hx: none  Smoker: No  Outside blood pressures being performed: Yes  BP Readings from Last 3 Encounters:   11/03/18 126/77   06/23/18 128/82   04/13/18 129/81     Imp: Hypertension controlled     Fibromyalgia  Duloxetine 40mg  daily, flexeril prn    Depression  Fluoxetine 10mg  Prozac  Duloxetine 40mg  daily    Ankle Fracture  Oxycodone prn    Sleep Apnea  CPAP  Sleep study 10/03/2017    Social:  Married with grown kids  Grandmother of 1   On disability  History of chronic opioids in the 1990s for fibromyalgia       Review of Systems   Constitutional: Positive for fatigue. Negative for activity change, chills, fever and unexpected weight change.   HENT: Negative for congestion, sore throat, trouble swallowing and voice change.    Eyes: Negative for discharge and visual disturbance.   Respiratory: Negative for cough, chest tightness, shortness of breath and wheezing.    Cardiovascular: Positive for leg swelling. Negative for chest pain.   Gastrointestinal: Negative for abdominal pain and blood in stool.   Endocrine: Negative for cold intolerance, heat intolerance, polydipsia, polyphagia and polyuria.   Genitourinary: Negative for difficulty urinating and urgency.   Musculoskeletal: Negative for arthralgias and myalgias.   Skin: Negative for color change and rash.   Neurological: Negative for dizziness, seizures, weakness, light-headedness and numbness.   Hematological: Positive for adenopathy. Psychiatric/Behavioral: Negative for dysphoric mood. The patient is not nervous/anxious.          Objective:         ??? ALPRAZolam (XANAX) 0.25 mg tablet Take one tablet by mouth at bedtime as needed for Anxiety.   ??? atorvastatin (LIPITOR) 40 mg tablet Take one tablet by mouth daily.   ??? blood sugar diagnostic (ONETOUCH VERIO) test strip Use 1 Strip as directed before meals and at bedtime.   ??? Blood-Glucose Sensor (DEXCOM G6 SENSOR) devi Use 1 each as directed as directed. Change every 10 days   ??? budesonide (ENTOCORT EC) 3  mg capsule TAKE 3 CAPSULES BY MOUTH IN THE MORNING (Patient taking differently: 3 mg every morning.)   ??? bumetanide (BUMEX) 2 mg tablet Take one tablet by mouth daily.   ??? buPROPion SR (WELLBUTRIN-SR) 150 mg tablet Take one tablet by mouth daily.   ??? cloNIDine (CATAPRESS) 0.1 mg tablet Take  by mouth daily.   ??? cyclobenzaprine (FLEXERIL) 10 mg tablet Take 10 mg by mouth daily as needed. Marland Kitchen   ??? DEXCOM G6 RECEIVER misc Use 1 each as directed daily.   ??? DEXCOM G6 TRANSMITTER devi Use 1 each as directed as directed. Every 90 days   ??? diltiazem CD (CARDIZEM CD) 240 mg capsule Take 240 mg by mouth daily.   ??? duloxetine DR (CYMBALTA) 60 mg capsule Take 60 mg by mouth daily with breakfast.   ??? fluoxetine (PROZAC) 10 mg capsule Take 10 mg by mouth daily.   ??? HUMULIN R U-500 (CONC) KWIKPEN 500 unit/mL (3 mL) injection PEN Take 280 units before breakfast, 210 units before lunch, and 135 units before dinner. Take about 20 minutes prior to the meal   ??? hydrALAZINE (APRESOLINE) 50 mg tablet Take one-half tablet by mouth three times daily. (Patient taking differently: Take 50 mg by mouth daily.)   ??? insulin pen needles (disposable) (B-D NANO) 32 gauge x 5/32 pen needle Use one each as directed five times daily. Use with insulin injections.   ??? lancets (ONE TOUCH DELICA) MISC Use 1 Each as directed before meals and at bedtime. Diag: Diabetes mellitus (E.11) ??? nebivolol (BYSTOLIC) 10 mg tablet Take one tablet by mouth daily.   ??? pantoprazole DR (PROTONIX) 40 mg tablet Take one tablet by mouth twice daily. Indications: gastroesophageal reflux disease   ??? spironolactone (ALDACTONE) 50 mg tablet Take 50 mg by mouth daily.   ??? temazepam (RESTORIL) 15 mg capsule Take 15 mg by mouth at bedtime as needed.     Vitals:    11/03/18 1510   BP: 126/77   Pulse: 89   PainSc: Eight     Vitals:    11/03/18 1510   BP: 126/77   Pulse: 89   PainSc: Eight       There is no height or weight on file to calculate BMI.     Physical Exam  Vitals signs and nursing note reviewed.   Constitutional:       Appearance: Tina Snyder is well-developed.   HENT:      Head: Normocephalic and atraumatic.   Eyes:      Conjunctiva/sclera: Conjunctivae normal.   Neck:      Musculoskeletal: Normal range of motion.   Cardiovascular:      Rate and Rhythm: Normal rate.   Pulmonary:      Effort: Pulmonary effort is normal.      Breath sounds: Normal breath sounds.   Abdominal:      General: Bowel sounds are normal.      Palpations: Abdomen is soft.   Musculoskeletal: Normal range of motion.      Comments: Upper extremity pitting edema >>> trace lower extemity pitting edema, swelling of the underarms with purple straie.    Lymphadenopathy:      Comments: Axillary lymphadenopathy   Skin:     General: Skin is warm and dry.   Neurological:      Mental Status: Tina Snyder is alert.         Labwork reviewed:  Lab Results   Component Value Date/Time    HGBA1C 9.3 (H) 10/24/2015  03:50 AM    HGBA1C 9.2 (H) 10/22/2015 04:11 AM    HGBA1C 5.0 10/20/2010 01:20 AM    A1C 9.2 03/27/2018 09:47 AM    MCALBR 7.1 03/27/2018 11:11 AM    TSH 1.85 10/30/2018 11:39 AM    FREET4R 0.7 09/25/2007 12:30 PM    CHOL 135 10/30/2018 11:39 AM    TRIG 272 (H) 10/30/2018 11:39 AM    HDL 35 (L) 10/30/2018 11:39 AM    LDL 75 10/30/2018 11:39 AM    NA 135 (L) 10/30/2018 11:39 AM    K 4.3 10/30/2018 11:39 AM    CL 99 10/30/2018 11:39 AM    CO2 24 10/30/2018 11:39 AM GAP 12 10/30/2018 11:39 AM    BUN 37 (H) 10/30/2018 11:39 AM    CR 1.95 (H) 10/30/2018 11:39 AM    GLU 283 (H) 10/30/2018 11:39 AM    CA 9.5 10/30/2018 11:39 AM    PO4 5.2 (H) 09/06/2012 04:17 AM    ALBUMIN 4.1 10/30/2018 11:39 AM    TOTPROT 6.7 10/30/2018 11:39 AM    ALKPHOS 117 (H) 10/30/2018 11:39 AM    AST 23 10/30/2018 11:39 AM    ALT 49 10/30/2018 11:39 AM    TOTBILI 0.4 10/30/2018 11:39 AM    GFR 27 (L) 10/30/2018 11:39 AM    GFRAA 33 (L) 10/30/2018 11:39 AM            Assessment and Plan:    Steven L. Klinker was seen today for establish care.    Diagnoses and all orders for this visit:    Depression  Worsening. Apathy.   > taper off prozac  > start welbutrin  > 1-2 mo fu    Peripheral Edema:   Post Thrombotic Syndrome  I continue to suspect this is post thrombotic syndrome. Upper >> lower extremity. 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 Hollie Beach does not feel this is responsible for her edema.  Tina Snyder has had significant improvement with lymphedema therapy and will continue to do so.  Interstingly Tina Snyder always has swollen lymph nodes during these episodes and I am considering a lymphoscintigraphy strongly.  Will start with an ultrasound of her axilla.   [facility].   Her rheumatologic wu was negative.   > Lymphoscintigraphy  consider  > axillary ultrasound  > [facility]  > continue bumex at reduced prior dose  > continue OT / restart    AKI:  Likely in setting of increased bumex. Tina Snyder returned to her prior dose. Additionally blood sugars have been elevated.   > repeat BMP today    Transaminitis: Resolved    Type 2 diabetes mellitus without complication, without long-term current use of insulin (HCC): Follows in endocrine here. Variable control. Recently highs and los.   > hemoglobin a1c  > continue control efforts with endocrine  > LDL at goal 70  > BP at goal    Crohn's disease of small intestine with other complication [facility]): Chronic > b12 monthly  > colonoscopy due next week    Essential hypertension: Better control. Following closely w/ cardiology.    Mycobacterium avium-intracellulare complex (HCC)  > Inactive. Following w/ [place] [facility] currently     OSA: Tolerating new CPAP                                 Patient Instructions   Wean off prozac,  take 5mg  capsule for 1-2 weeks.   Start wellbutrin 150mg      [email or web address]      Bupropion extended-release tablets (Depression/Mood Disorders)  Brand Names: Aplenzin, Budeprion XL, Forfivo XL, Wellbutrin XL  What is this medicine?  BUPROPION (byoo PROE pee on) is used to treat depression.  How should I use this medicine?  Take this medicine by mouth with a glass of water. Follow the directions on the prescription label. You can take it with or without food. If it upsets your stomach, take it with food. Do not crush, chew, or cut these tablets. This medicine is taken once daily at the same time each day. Do not take your medicine more often than directed. Do not stop taking this medicine suddenly except upon the advice of your doctor. Stopping this medicine too quickly may cause serious side effects or your condition may worsen.  A special MedGuide will be given to you by the pharmacist with each prescription and refill. Be sure to read this information carefully each time.  Talk to your pediatrician regarding the use of this medicine in children. Special care may be needed.  What side effects may I notice from receiving this medicine?  Side effects that you should report to your doctor or health care professional as soon as possible:  ??? allergic reactions like skin rash, itching or hives, swelling of the face, lips, or tongue  ??? breathing problems  ??? changes in vision  ??? confusion  ??? elevated mood, decreased need for sleep, racing thoughts, impulsive behavior  ??? fast or irregular heartbeat  ??? hallucinations, loss of contact with reality  ??? increased blood pressure ??? redness, blistering, peeling or loosening of the skin, including inside the mouth  ??? seizures  ??? suicidal thoughts or other mood changes  ??? unusually weak or tired  ??? vomiting  Side effects that usually do not require medical attention (report to your doctor or health care professional if they continue or are bothersome):  ??? constipation  ??? headache  ??? loss of appetite  ??? nausea  ??? tremors  ??? weight loss  What may interact with this medicine?  Do not take this medicine with any of the following medications:  ??? linezolid  ??? MAOIs like Azilect, Carbex, Eldepryl, Marplan, Nardil, and Parnate  ??? methylene blue (injected into a vein)  ??? other medicines that contain bupropion like Zyban  This medicine may also interact with the following medications:  ??? alcohol  ??? certain medicines for anxiety or sleep  ??? certain medicines for blood pressure like metoprolol, propranolol  ??? certain medicines for depression or psychotic disturbances  ??? certain medicines for HIV or AIDS like efavirenz, lopinavir, nelfinavir, ritonavir  ??? certain medicines for irregular heart beat like propafenone, flecainide  ??? certain medicines for Parkinson's disease like amantadine, levodopa  ??? certain medicines for seizures like carbamazepine, phenytoin, phenobarbital  ??? cimetidine  ??? clopidogrel  ??? cyclophosphamide  ??? digoxin  ??? furazolidone  ??? isoniazid  ??? nicotine  ??? orphenadrine  ??? procarbazine  ??? steroid medicines like prednisone or cortisone  ??? stimulant medicines for attention disorders, weight loss, or to stay awake  ??? tamoxifen  ??? theophylline  ??? thiotepa  ??? ticlopidine  ??? tramadol  ??? warfarin  What if I miss a dose?  If you miss a dose, skip the missed dose and take your next tablet at the regular time. Do not take double or extra doses.  Where should I  keep my medicine?  Keep out of the reach of children.  Store at room temperature between 15 and 30 degrees C (59 and 86 degrees F). Throw away any unused medicine after the expiration date. What should I tell my health care provider before I take this medicine?  They need to know if you have any of these conditions:  ??? an eating disorder, such as anorexia or bulimia  ??? bipolar disorder or psychosis  ??? diabetes or high blood sugar, treated with medication  ??? glaucoma  ??? head injury or brain tumor  ??? heart disease, previous heart attack, or irregular heart beat  ??? high blood pressure  ??? kidney or liver disease  ??? seizures (convulsions)  ??? suicidal thoughts or a previous suicide attempt  ??? Tourette's syndrome  ??? weight loss  ??? an unusual or allergic reaction to bupropion, other medicines, foods, dyes, or preservatives  ??? breast-feeding  ??? pregnant or trying to become pregnant  What should I watch for while using this medicine?  Tell your doctor if your symptoms do not get better or if they get worse. Visit your doctor or health care professional for regular checks on your progress. Because it may take several weeks to see the full effects of this medicine, it is important to continue your treatment as prescribed by your doctor.  Patients and their families should watch out for new or worsening thoughts of suicide or depression. Also watch out for sudden changes in feelings such as feeling anxious, agitated, panicky, irritable, hostile, aggressive, impulsive, severely restless, overly excited and hyperactive, or not being able to sleep. If this happens, especially at the beginning of treatment or after a change in dose, call your health care professional.  Avoid alcoholic drinks while taking this medicine. Drinking large amounts of alcoholic beverages, using sleeping or anxiety medicines, or quickly stopping the use of these agents while taking this medicine may increase your risk for a seizure.  Do not drive or use heavy machinery until you know how this medicine affects you. This medicine can impair your ability to perform these tasks.  Do not take this medicine close to bedtime. It may prevent you from sleeping.  Your mouth may get dry. Chewing sugarless gum or sucking hard candy, and drinking plenty of water may help. Contact your doctor if the problem does not go away or is severe.  The tablet shell for some brands of this medicine does not dissolve. This is normal. The tablet shell may appear whole in the stool. This is not a cause for concern.  NOTE:This sheet is a summary. It may not cover all possible information. If you have questions about this medicine, talk to your doctor, pharmacist, or health care provider. Copyright??? 2020 Elsevier            No follow-ups on file.

## 2018-11-04 ENCOUNTER — Encounter: Admit: 2018-11-04 | Discharge: 2018-11-04

## 2018-11-04 ENCOUNTER — Ambulatory Visit: Admit: 2018-11-03 | Discharge: 2018-11-04

## 2018-11-04 DIAGNOSIS — M359 Systemic involvement of connective tissue, unspecified: Secondary | ICD-10-CM

## 2018-11-04 DIAGNOSIS — G43909 Migraine, unspecified, not intractable, without status migrainosus: Secondary | ICD-10-CM

## 2018-11-04 DIAGNOSIS — R42 Dizziness and giddiness: Secondary | ICD-10-CM

## 2018-11-04 DIAGNOSIS — K5090 Crohn's disease, unspecified, without complications: Secondary | ICD-10-CM

## 2018-11-04 DIAGNOSIS — G40909 Epilepsy, unspecified, not intractable, without status epilepticus: Secondary | ICD-10-CM

## 2018-11-04 DIAGNOSIS — I8290 Acute embolism and thrombosis of unspecified vein: Secondary | ICD-10-CM

## 2018-11-04 DIAGNOSIS — K219 Gastro-esophageal reflux disease without esophagitis: Secondary | ICD-10-CM

## 2018-11-04 DIAGNOSIS — I10 Essential (primary) hypertension: Secondary | ICD-10-CM

## 2018-11-04 DIAGNOSIS — R599 Enlarged lymph nodes, unspecified: Secondary | ICD-10-CM

## 2018-11-04 DIAGNOSIS — K7581 Nonalcoholic steatohepatitis (NASH): Secondary | ICD-10-CM

## 2018-11-04 DIAGNOSIS — D4959 Neoplasm of unspecified behavior of other genitourinary organ: Secondary | ICD-10-CM

## 2018-11-04 DIAGNOSIS — E119 Type 2 diabetes mellitus without complications: Secondary | ICD-10-CM

## 2018-11-04 DIAGNOSIS — E139 Other specified diabetes mellitus without complications: Secondary | ICD-10-CM

## 2018-11-04 DIAGNOSIS — IMO0002 Ulcer: Secondary | ICD-10-CM

## 2018-11-04 DIAGNOSIS — K639 Disease of intestine, unspecified: Secondary | ICD-10-CM

## 2018-11-04 DIAGNOSIS — D699 Hemorrhagic condition, unspecified: Secondary | ICD-10-CM

## 2018-11-04 DIAGNOSIS — J302 Other seasonal allergic rhinitis: Secondary | ICD-10-CM

## 2018-11-04 DIAGNOSIS — M797 Fibromyalgia: Secondary | ICD-10-CM

## 2018-11-05 ENCOUNTER — Encounter: Admit: 2018-11-05 | Discharge: 2018-11-05

## 2018-11-05 DIAGNOSIS — Z1159 Encounter for screening for other viral diseases: Secondary | ICD-10-CM

## 2018-11-05 MED ORDER — PEG-ELECTROLYTE SOLN 420 GRAM PO SOLR
4 L | Freq: Once | ORAL | 0 refills | Status: AC
Start: 2018-11-05 — End: ?

## 2018-11-05 NOTE — Progress Notes
Due to your upcoming scheduled procedure, you have been scheduled for COVID-19 Swab testing to be completed within 48 hours of scheduled procedure.     Appointment Information:  - Date: Monday, November 09, 2018  - Time: 2:10 PM  - Location: The [facility]  - [address]  [place], [place]., [address]    Patient Instructions:  When you arrive to the clinic for your appointment, pull up to the cones, call the number and someone will come out to collect the swab. You will not need to get out of your car for the appointment. You will receive details, frequently asked questions and answers, and a map to the swab clinic location in a MyChart message or by email.    It is extremely important that you quarantine between getting tested and receiving the results. Please stay at home, wash your hands frequently, avoid touching your face, clean high-touch surfaces and maintain at least 6 feet of distance between yourself and others.    We will contact you when your test results are available. If your result is positive, we will call you. You will not be able to have surgery, and we'll help you obtain care for COVID-19. If your result is negative, your surgery will proceed, and we will notify you of scheduling details and next steps in MyChart.    Appointment Notes   GI Pre Scheduled - 11/11/2018 at 9:00am   Swab - 11/09/2018 at 2:10pm   ???   Visit Instructions   Directions   If you are coming from the [facility] on [address]:   ??? Turn left (east) onto [address].   ??? Turn right (south) onto [address].   ??? Turn right (west) onto [address].   ???  If you are coming from the [facility] on [address]:   ??? Turn right (east) onto [address].   ??? Turn left (north) onto [address].   ??? Turn left (west) onto [address].   ???  Then look for the electronic sign. Follow the large blue signs featuring phone number [phone]. Pull up along [facility] by these blue signs. Once parked, call [phone]. Please remain in your vehicle at all times.   ???     ???   Directions to [facility] SCREEN CL   Location: [facility]   [address]    [place], [place] [address]   ([place] and [employer])

## 2018-11-05 NOTE — Progress Notes
Confirmed procedure date/time with patient, reviewed prep instructions with patient, verbalized good understanding, script sent to Pharmacy, confirmed patient would have a driver and placed instructions in MyChart for patient.    ----------------------------    EGD (ESOPHAGOGASTRODUODENOSCOPY) PREP      Upper GI endoscopy allows healthcare providers to look directly into the beginning of your gastrointestinal(GI) tract.  The esophagus, stomach, and duodenum (first part of the small intestine) make up the upper GI tract.      5 Days Prior:  1. Check with your prescribing physician for instructions about stopping your blood thinner.  Examples of blood thinners are Aleve, Aspirin. Coumadin, Eliquis, Ibuprofen, Naproxen, Plavix, and Xarelto.  2. Do not give yourself a Lovenox injection the morning of the test. Lovenox injections may be taken as usual through the day before your test.    Day of Exam:  1. Do not eat or drink anything after midnight the night before your exam. However, if your exam is in the afternoon you may drink clear liquids only up until (4) hours before your scheduled procedure time.  After this, you should have nothing by mouth.  This includes GUM or CANDY.   a. Chewing tobacco must be stopped  (6) hours before your scheduled procedure.   b. If you have an early morning test, take ONLY your essential morning medications (heart, blood pressure, seizure, etc.) with a small sip of water.   c. You will be sedated for the procedure. A responsible adult must drive you home (no Benedetto Goad, taxis, or buses are permitted). If you do not have a driver we will be unable to do the test.   d. You will be here for (3-4) hours from arrival time.   e. You will not be able to return to work the same day.   f. Please bring a list of your current medications and the dosages with you.     The Procedure:  ? You will lie on the endoscopy table. Usually patients lie on the left side.  ? You will be monitored and given oxygen. ? You are given sedation (relaxing) medication through an intravenous (IV) line.  ? The healthcare provider will put the endoscope in your mouth and down your esophagus. It is thinner than most pieces of food that you swallow.  It will not affect your breathing. The medicine helps keep you from gagging.   ? Air is inserted to expand your GI tract. It can make you burp.  ? During the procedure, the healthcare provider can take biopsies (tissue samples), remove abnormalities such as polyps, or treat abnormalities though a variety of devices placed through the endoscope. You will not feel this.   ? The endoscope carries images of your upper GI tract to a video screen.  ? An adult must drive you home.     -------------------------    SPLIT DOSE NULYTELY/GOLYTELY PREP (Recommended)  PATIENT PREPARATION INSTRUCTIONS? COLONOSCOPY      Instructions: Fill your prescription at least (3) days before your scheduled procedure time.                 1 Week Prior:   1. Stop taking iron supplements (including multivitamins containing iron).  2. Do not eat any foods containing nuts, seeds, or kernels (for example: sunflower seeds or popcorn).  3. Discuss diabetic medications and insulin with the prescribing physician.    5 Days Prior:  1. Check with your prescribing physician for instructions about stopping your blood  thinner. Examples of blood thinners are Aleve, Aspirin, Coumadin, Eliquis, Ibuprofen, Naproxen, Plavix, and Xarelto.  2. Do not give yourself a Lovenox injection the morning of the test. Lovenox injections may be taken as usual through the day before the test.    4 Days Prior:  3. Stop taking Metamucil, Perdiem, Citrucel, or any other bulk laxatives. Miralax is okay.     Day Prior:   2. Beginning in the morning, start a clear liquid diet. Drink a generous amount of clear liquids throughout the day, no alcohol. If you are on a fluid restriction, drink the recommended amount of clear liquids allowed by your physician. No solid or creamed/pureed foods.        Clear Liquid Diet (avoid all items with RED, PURPLE, or ORANGE coloring)  Water    Apple or White Grape Juice   Coffee or tea without cream   Tea    White Cranberry Juice   Chicken Bouillon or Broth (no noodles)  Soda Pop(all pop Is OK)  Green or Yellow Popsicles  Beef Bouillon or Broth (no noodles)    3. At 11:00 A.M. fill the Nulytely/Golytely jug to the ?ill? line with lukewarm drinking water. Cap the jug and shake to dissolve the powder.  Place the jug in the refrigerator.   4. At 5:00 P.M. drink one 8-ounce glass of Nulytely/Golytely and repeat every (10-15) minutes until you have finished the first (?) gallon of Nulytely/Golytely (3) liters. If you become nauseated hold off on drinking for (30) minutes or so, then resume.    Day of Test:  1. Continue clear liquid diet.  2. Five hours before your scheduled procedure time drink the remaining Nulytely/Golytely 8-ounces at a time. Repeat every (10-15) minutes until you have finished.   3. You may drink clear liquids up until (4) hours before your scheduled procedure time. After this you should have nothing by mouth. This includes GUM or CANDY.   a. Chewing tobacco must be stopped (6) hours before your scheduled procedure.   b. If you have an early morning test, take ONLY your essential morning medications (heart, blood pressure, seizure, etc.) with a small sip of water.  4. You will be sedated for the procedure.  A responsible adult must drive you home (no Benedetto Goad, taxis or buses are allowed). If you do not have a driver we will be unable to do the test.  5. You will be here for (3-4) hours from arrival time.   6. You will not be able to return to work the same day if you have received sedation.   7. Please bring a list of your current medications and the dosages with you.

## 2018-11-09 ENCOUNTER — Encounter: Admit: 2018-11-09 | Discharge: 2018-11-10

## 2018-11-09 DIAGNOSIS — Z1159 Encounter for screening for other viral diseases: Secondary | ICD-10-CM

## 2018-11-09 NOTE — Progress Notes
Patient arrived to [facility] for COVID-19 testing 11/09/18 1410. Patient identity confirmed via photo I.D. Nasopharyngeal procedure explained to the patient.   Nasopharyngeal swab completed right  Patient education provided given and instructed patient self isolate until contacted w/ results and further instructions.   Swab collected by Burney Gauze.    Date symptoms began/reason for testing: pre op

## 2018-11-10 ENCOUNTER — Encounter: Admit: 2018-11-10 | Discharge: 2018-11-10

## 2018-11-10 LAB — COVID-19 (SARS-COV-2) PCR

## 2018-11-11 ENCOUNTER — Ambulatory Visit: Admit: 2018-11-11 | Discharge: 2018-11-11

## 2018-11-11 ENCOUNTER — Encounter: Admit: 2018-11-11 | Discharge: 2018-11-11

## 2018-11-11 DIAGNOSIS — I10 Essential (primary) hypertension: Secondary | ICD-10-CM

## 2018-11-11 DIAGNOSIS — M797 Fibromyalgia: Secondary | ICD-10-CM

## 2018-11-11 DIAGNOSIS — I119 Hypertensive heart disease without heart failure: Secondary | ICD-10-CM

## 2018-11-11 DIAGNOSIS — E119 Type 2 diabetes mellitus without complications: Secondary | ICD-10-CM

## 2018-11-11 DIAGNOSIS — K3189 Other diseases of stomach and duodenum: Secondary | ICD-10-CM

## 2018-11-11 DIAGNOSIS — Z882 Allergy status to sulfonamides status: Secondary | ICD-10-CM

## 2018-11-11 DIAGNOSIS — Z794 Long term (current) use of insulin: Secondary | ICD-10-CM

## 2018-11-11 DIAGNOSIS — K7581 Nonalcoholic steatohepatitis (NASH): Secondary | ICD-10-CM

## 2018-11-11 DIAGNOSIS — R12 Heartburn: Secondary | ICD-10-CM

## 2018-11-11 DIAGNOSIS — Z90710 Acquired absence of both cervix and uterus: Secondary | ICD-10-CM

## 2018-11-11 DIAGNOSIS — G43909 Migraine, unspecified, not intractable, without status migrainosus: Secondary | ICD-10-CM

## 2018-11-11 DIAGNOSIS — K639 Disease of intestine, unspecified: Secondary | ICD-10-CM

## 2018-11-11 DIAGNOSIS — Z885 Allergy status to narcotic agent status: Secondary | ICD-10-CM

## 2018-11-11 DIAGNOSIS — IMO0002 Ulcer: Secondary | ICD-10-CM

## 2018-11-11 DIAGNOSIS — M359 Systemic involvement of connective tissue, unspecified: Secondary | ICD-10-CM

## 2018-11-11 DIAGNOSIS — E139 Other specified diabetes mellitus without complications: Secondary | ICD-10-CM

## 2018-11-11 DIAGNOSIS — I8290 Acute embolism and thrombosis of unspecified vein: Secondary | ICD-10-CM

## 2018-11-11 DIAGNOSIS — K5010 Crohn's disease of large intestine without complications: Secondary | ICD-10-CM

## 2018-11-11 DIAGNOSIS — Z881 Allergy status to other antibiotic agents status: Secondary | ICD-10-CM

## 2018-11-11 DIAGNOSIS — J302 Other seasonal allergic rhinitis: Secondary | ICD-10-CM

## 2018-11-11 DIAGNOSIS — K219 Gastro-esophageal reflux disease without esophagitis: Secondary | ICD-10-CM

## 2018-11-11 DIAGNOSIS — K5090 Crohn's disease, unspecified, without complications: Secondary | ICD-10-CM

## 2018-11-11 DIAGNOSIS — D4959 Neoplasm of unspecified behavior of other genitourinary organ: Secondary | ICD-10-CM

## 2018-11-11 DIAGNOSIS — G40909 Epilepsy, unspecified, not intractable, without status epilepticus: Secondary | ICD-10-CM

## 2018-11-11 DIAGNOSIS — D699 Hemorrhagic condition, unspecified: Secondary | ICD-10-CM

## 2018-11-11 DIAGNOSIS — R42 Dizziness and giddiness: Secondary | ICD-10-CM

## 2018-11-11 LAB — POC GLUCOSE: Lab: 177 mg/dL — ABNORMAL HIGH (ref >60)

## 2018-11-11 MED ORDER — PROPOFOL 10 MG/ML IV EMUL 20 ML (INFUSION)(AM)(OR)
INTRAVENOUS | 0 refills | Status: DC
Start: 2018-11-11 — End: 2018-11-11
  Administered 2018-11-11: 14:00:00 100 ug/kg/min via INTRAVENOUS

## 2018-11-11 MED ORDER — FENTANYL CITRATE (PF) 50 MCG/ML IJ SOLN
50 ug | Freq: Once | INTRAVENOUS | 0 refills | Status: CP
Start: 2018-11-11 — End: ?
  Administered 2018-11-11: 15:00:00 50 ug via INTRAVENOUS

## 2018-11-11 MED ORDER — LACTATED RINGERS IV SOLP
1000 mL | Freq: Once | INTRAVENOUS | 0 refills | Status: CP
Start: 2018-11-11 — End: ?
  Administered 2018-11-11: 14:00:00 1000 mL via INTRAVENOUS

## 2018-11-11 MED ORDER — LACTATED RINGERS IV SOLP
0 refills | Status: DC
Start: 2018-11-11 — End: 2018-11-11
  Administered 2018-11-11: 14:00:00 via INTRAVENOUS

## 2018-11-11 MED ORDER — LIDOCAINE (PF) 200 MG/10 ML (2 %) IJ SYRG
0 refills | Status: DC
Start: 2018-11-11 — End: 2018-11-11
  Administered 2018-11-11: 14:00:00 100 mg via INTRAVENOUS

## 2018-11-11 MED ORDER — OXYCODONE 5 MG PO TAB
5 mg | Freq: Once | ORAL | 0 refills | Status: CP
Start: 2018-11-11 — End: ?
  Administered 2018-11-11: 15:00:00 5 mg via ORAL

## 2018-11-11 NOTE — H&P (View-Only)
Pre Procedure History and Physical/Sedation Plan    Name:Tina Snyder                                                                   MRN: [id number]                 DOB:May 10, 1965          Age: 53 y.o.  Date of Service: 11/11/2018    Date of Procedure:  11/11/2018    Planned Procedure(s):  GI:  EGD and Colonoscopy  Sedation/Medication Plan: MAC (Monitored Anesthesia Care)  Discussion/Reviews:  Physician has discussed risks and alternatives of this type of sedation and above planned procedures with patient  ___________________________________________________________________  Chief Complaint:  Crohn's disease    History of Present Illness: Tina Snyder is a 53 y.o. female; Crohn's disease s/p surgery/reflux symptoms/weight gain/here for assessment of disease activity    Previous Anesthetic/Sedation History:  reviewed    Medical History:   Diagnosis Date   ??? Autoimmune disease (HCC)     Chron's   ??? Bleeding tendency (HCC)    ??? Blood clot in vein     subclavian   ??? Bowel disease    ??? Crohn disease (HCC)    ??? Dizziness    ??? DM (diabetes mellitus), secondary-steroid-induced 2012   ??? Fibromyalgia    ??? GERD (gastroesophageal reflux disease)    ??? Hypertension    ??? Hypertensive heart disease    ??? Migraines    ??? Seasonal allergic reaction    ??? Seizure disorder (HCC)    ??? Steatohepatitis     felt secondary to medications   ??? Type 2 diabetes mellitus without complication, with long-term current use of insulin (HCC) 10/22/2017   ??? Ulcer    ??? Vaginal tumor      Surgical History:   Procedure Laterality Date   ??? HX ADENOIDECTOMY  05/1995   ??? LUNG SURGERY  10/22/2010    lung biopsy   ??? LIVER BIOPSY  12/2010   ??? ESOPHAGOGASTRODUODENOSCOPY N/A 03/13/2015    Performed by Elyn Aquas, MD at [facility] ENDO   ??? COLONOSCOPY N/A 03/13/2015    Performed by Elyn Aquas, MD at [facility] ENDO   ??? ESOPHAGOGASTRODUODENOSCOPY BIOPSY  03/13/2015    Performed by Elyn Aquas, MD at [facility] ENDO   ??? COLONOSCOPY BIOPSY  03/13/2015 Performed by Elyn Aquas, MD at [facility] ENDO   ??? ESOPHAGOGASTRODUODENOSCOPY WITH SPECIMEN COLLECTION BY BRUSHING/ WASHING N/A 06/30/2017    Performed by Jolee Ewing, MD at [facility] ENDO   ??? COLONOSCOPY DIAGNOSTIC WITH SPECIMEN COLLECTION BY BRUSHING/ WASHING - FLEXIBLE N/A 06/30/2017    Performed by Jolee Ewing, MD at [facility] ENDO   ??? ESOPHAGOGASTRODUODENOSCOPY WITH BIOPSY - FLEXIBLE N/A 06/30/2017    Performed by Jolee Ewing, MD at [facility] ENDO   ??? COLONOSCOPY WITH BIOPSY - FLEXIBLE  06/30/2017    Performed by Jolee Ewing, MD at [facility] ENDO   ??? APPENDECTOMY     ??? BRONCHOSCOPY     ??? CHOLECYSTECTOMY     ??? GASTRIC FUNDOPLICATION     ??? HEMORRHOIDECTOMY     ??? HX BREAST LUMPECTOMY     ??? HX ENDOSCOPY      colonoscopy 2014   ???  HX HYSTERECTOMY      partial 02/1995, ovaries removed 1999   ??? OOPHORECTOMY      1999 ovaries removed   ??? SMALL BOWEL RESECTION     ??? SUBTOTAL COLECTOMY      Ileocolectomy      Pertinent medical/surgical history reviewed  Pertinent family history reviewed  Social History     Tobacco Use   ??? Smoking status: Never Smoker   ??? Smokeless tobacco: Never Used   Substance Use Topics   ??? Alcohol use: No     Alcohol/week: 0.0 standard drinks   ??? Drug use: No     Social History     Substance and Sexual Activity   Drug Use No     Allergies:  Bee venom protein (honey bee); Morphine; Bee [bumble bee]; Ciprofloxacin; Compazine [prochlorperazine]; Coumadin [warfarin]; Flagyl [metronidazole]; Lovenox [enoxaparin]; Reglan [metoclopramide]; Stadol [butorphanol tartrate]; Sulfa (sulfonamide antibiotics); Tysabri [natalizumab]; Walnut; Zyrtec [cetirizine]; and Pecan nut  Medications  No current facility-administered medications for this encounter.      Review of Systems:  Cardiovascular: negative  Gastrointestinal: negative           Physical Exam:  Temp: 36.6 ???C (97.9 ???F) (07/22 0700)  Pulse: 79 (07/22 0700)  Respirations: 21 PER MINUTE (07/22 0700)  BP: 124/77 (07/22 0700)  General appearance: alert Throat: Lips, mucosa, and tongue normal. Teeth and gums normal  Lungs: no tachypnea  Heart: regular rate and rhythm  Abdomen: scar  Extremities: extremities normal, atraumatic, no cyanosis or edema  @  Airway:  airway assessment performed  Mallampati III (soft palate, base of uvula visible)  Anesthesia Classification:  ASA III (A patient with a severe systemic disease that limits activity, but is not incapacitating)  NPO Status: Acceptable  Pregnancy Status: Not Pregnant    Lab/Radiology/Other Diagnostic Tests  Labs:  Relevant labs reviewed      Tommie Sams, MD  Pager

## 2018-11-11 NOTE — Discharge Planning (AHS/AVS)
EGD/Colonoscopy  Post Upper and Lower Endoscopy Instructions      -No new dietary restrictions, may resume diet.    -You may have a sore throat after the procedure for 2-3 days.  Try sucrets or lozenges to help ease the pain.  If it continues please contact us.    -If you feel feverish, have a temperature of 101 degrees or higher, persistent nausea and vomiting, abdominal pain or dark stools; please notify your nurse or GI physician.    -You may have abdominal cramping following the procedure this can be relieved by belching or passing air.    -If you have redness or swelling at the IV site, place a warm, wet washcloth over the affected areas for 15 minutes, 3-4 times a day until the redness subsides.  If symptoms continue for 2-3 days, contact your regular physician.    - If you have bleeding from your mouth or rectum, over 2 tablespoons and increasing, please notify your physician.  A small amount of bleeding is normal if a biopsy or polyps were taken.  If you are vomiting blood you need to seek immediate medical attention.    - You may resume all your routine medications, if medications need to be held your physician and/or nurse will notify you post procedure.    SPECIFIC INSTRUCTIONS  INPATIENTS:  Ask for help when you get up in your room, as you may still be drowsy from your sedation.    OUTPATIENTS:  A. Because of sedation and lack of coordination, FOR THE NEXT 24 HOURS, DO NOT:  1. Operate any motorized vehicle - this includes driving.  2. Sign any legal documents or conduct important business matters.  3. Use any dangerous machinery (chain saw, lawnmower, etc.).  4. Drink any alcoholic beverages.  Should you have any questions or concerns after your procedure please call [phone] M-F 8am-5:00 pm. After 5:00 pm, holidays or weekends call [phone] and ask for the GI Doctor on call.

## 2018-11-11 NOTE — Anesthesia Pre-Procedure Evaluation
Anesthesia Pre-Procedure Evaluation    Name: Tina Snyder      MRN: [id number]     DOB: 10-08-1965     Age: 53 y.o.     Sex: female   __________________________________________________________________________     Procedure Date: 11/11/2018   Procedure: Procedure(s):  COLONOSCOPY DIAGNOSTIC WITH SPECIMEN COLLECTION BY BRUSHING/ WASHING - FLEXIBLE  ESOPHAGOGASTRODUODENOSCOPY WITH SPECIMEN COLLECTION BY BRUSHING/ WASHING     Physical Assessment  Vital Signs (last filed in past 24 hours):  BP: 124/77 (07/22 0700)  Temp: 36.6 ???C (97.9 ???F) (07/22 0700)  Pulse: 79 (07/22 0700)  Respirations: 21 PER MINUTE (07/22 0700)  SpO2: 99 % (07/22 0700)  Height: 162.6 cm (64) (07/22 0700)  Weight: 101.2 kg (223 lb) (07/22 0700)      Patient History  Allergies   Allergen Reactions   ??? Bee Venom Protein (Honey Bee) ANAPHYLAXIS   ??? Morphine HALLUCINATIONS     States she quits breathing     ??? Bee [Bumble Bee] UNKNOWN   ??? Ciprofloxacin NAUSEA AND VOMITING   ??? Compazine [Prochlorperazine] HIVES and SEE COMMENTS     Tolerates Phenergan   ??? Coumadin [Warfarin] SEE COMMENTS     Hemorrhage   ??? Flagyl [Metronidazole] HIVES   ??? Lovenox [Enoxaparin] SEE COMMENTS     Hemorrhage   ??? Reglan [Metoclopramide] SEE COMMENTS     Patient states that she cannot tolerate.   ??? Stadol [Butorphanol Tartrate] SEE COMMENTS     Patient states that it made her loopy and drunk.   ??? Sulfa (Sulfonamide Antibiotics) HIVES and NAUSEA AND VOMITING   ??? Tysabri [Natalizumab] SEE COMMENTS     JC virus positive.  Do not use Tysabri - high risk for PML   ??? Walnut UNKNOWN   ??? Zyrtec [Cetirizine] UNKNOWN   ??? Pecan Nut UNKNOWN        Current Medications    Medication Directions   ALPRAZolam (XANAX) 0.25 mg tablet Take one tablet by mouth at bedtime as needed for Anxiety.   atorvastatin (LIPITOR) 40 mg tablet Take one tablet by mouth daily.   blood sugar diagnostic (ONETOUCH VERIO) test strip Use 1 Strip as directed before meals and at bedtime. Blood-Glucose Sensor (DEXCOM G6 SENSOR) devi Use 1 each as directed as directed. Change every 10 days   budesonide (ENTOCORT EC) 3 mg capsule TAKE 3 CAPSULES BY MOUTH IN THE MORNING  Patient taking differently: 3 mg every morning.   bumetanide (BUMEX) 2 mg tablet Take one tablet by mouth daily.   buPROPion SR (WELLBUTRIN-SR) 150 mg tablet Take one tablet by mouth daily.   cloNIDine (CATAPRESS) 0.1 mg tablet Take  by mouth daily.   cyclobenzaprine (FLEXERIL) 10 mg tablet Take 10 mg by mouth daily as needed. Vira Agar G6 RECEIVER misc Use 1 each as directed daily.   DEXCOM G6 TRANSMITTER devi Use 1 each as directed as directed. Every 90 days   diltiazem CD (CARDIZEM CD) 240 mg capsule Take 240 mg by mouth daily.   duloxetine DR (CYMBALTA) 60 mg capsule Take 60 mg by mouth daily with breakfast.   fluoxetine (PROZAC) 10 mg capsule Take 10 mg by mouth daily.   HUMULIN R U-500 (CONC) KWIKPEN 500 unit/mL (3 mL) injection PEN Take 280 units before breakfast, 210 units before lunch, and 135 units before dinner. Take about 20 minutes prior to the meal   hydrALAZINE (APRESOLINE) 50 mg tablet Take one-half tablet by mouth three times daily.  Patient  taking differently: Take 50 mg by mouth daily.   insulin pen needles (disposable) (B-D NANO) 32 gauge x 5/32 pen needle Use one each as directed five times daily. Use with insulin injections.   lancets (ONE TOUCH DELICA) MISC Use 1 Each as directed before meals and at bedtime. Diag: Diabetes mellitus (E.11)   nebivolol (BYSTOLIC) 10 mg tablet Take one tablet by mouth daily.   pantoprazole DR (PROTONIX) 40 mg tablet Take one tablet by mouth twice daily. Indications: gastroesophageal reflux disease   spironolactone (ALDACTONE) 50 mg tablet Take 50 mg by mouth daily.   temazepam (RESTORIL) 15 mg capsule Take 15 mg by mouth at bedtime as needed.         Review of Systems/Medical History      Patient summary reviewed  Nursing notes reviewed  Pertinent labs reviewed PONV Screening: Female gender and Non-smoker  No history of anesthetic complications  No family history of anesthetic complications      Airway - negative        Pulmonary       Not a current smoker        Asthma (chronic cough)    no COPD      No indications/hx of pneumonia      No shortness of breath (alll the time; no differences with positioning)        Sleep apnea          Interventions: CPAP; compliant      Cardiovascular         Exercise tolerance: <4 METS       Beta Blocker therapy: Yes      Beta blockers within 24 hours: Yes        Hypertension, well controlled      DVT (Postop 2009)      Hyperlipidemia      GI/Hepatic/Renal       Inflammatory bowel disease (Crohns)        GERD, poorly controlled      Liver disease (NASH)      Neuro/Psych         Headaches (Migraines, last 1 week ago)      Neuropathy (Fibromyalgia)        Psychiatric history          Depression      Musculoskeletal         Back pain      Endocrine/Other       Diabetes (A1c 9's), poorly controlled, type 2; using insulin      Anemia      Autoimmune disease (Crohn's)      Obesity    Constitution - negative   Physical Exam    Airway Findings      Mallampati: III      TM distance: >3 FB      Neck ROM: full      Mouth opening: good    Dental Findings: Negative      Cardiovascular Findings:       Rhythm: regular      Rate: normal    Pulmonary Findings:       Breath sounds clear to auscultation.    Abdominal Findings:       Obese    Neurological Findings:       Alert and oriented x 3    Constitutional findings:       No acute distress       Diagnostic Tests  Hematology:   Lab Results   Component  Value Date    HGB 12.8 04/10/2018    HCT 40.5 04/10/2018    PLTCT 443 04/10/2018    WBC 20.7 04/10/2018    NEUT 85 09/09/2017    ANC 14.69 04/10/2018    ANC 11.00 09/09/2017    LYMPH 15 04/10/2018    ALC 1.30 09/09/2017    MONA 5 09/09/2017    AMC 0.60 09/09/2017    EOSA 0 09/09/2017    ABC 0.00 09/09/2017    MCV 84.2 04/10/2018    MCH 26.7 04/10/2018 MCHC 31.7 04/10/2018    MPV 8.4 04/10/2018    RDW 19.0 04/10/2018         General Chemistry:   Lab Results   Component Value Date    NA 135 10/30/2018    K 4.3 10/30/2018    CL 99 10/30/2018    CO2 24 10/30/2018    GAP 12 10/30/2018    BUN 37 10/30/2018    CR 1.95 10/30/2018    GLU 283 10/30/2018    CA 9.5 10/30/2018    ALBUMIN 4.1 10/30/2018    LACTIC 1.7 04/10/2018    OBSCA 1.22 10/25/2010    MG 2.2 01/26/2018    TOTBILI 0.4 10/30/2018    PO4 5.2 09/06/2012      Coagulation:   Lab Results   Component Value Date    PTT 35.9 03/28/2011    INR 1.0 06/27/2017         Anesthesia Plan    ASA score: 3   Plan: MAC  Induction method: intravenous  NPO status: acceptable      Informed Consent  Anesthetic plan and risks discussed with patient.        Plan discussed with: anesthesiologist and CRNA.

## 2018-11-11 NOTE — Anesthesia Post-Procedure Evaluation
Post-Anesthesia Evaluation    Name: Tina Snyder      MRN: [id number]     DOB: [birth year]     Age: 53 y.o.     Sex: female   __________________________________________________________________________     Procedure Information     Anesthesia Start Date/Time:  11/11/18 0847    Procedures:       COLONOSCOPY DIAGNOSTIC WITH SPECIMEN COLLECTION BY BRUSHING/ WASHING - FLEXIBLE (N/A )      ESOPHAGOGASTRODUODENOSCOPY WITH SPECIMEN COLLECTION BY BRUSHING/ WASHING (N/A )      ESOPHAGOGASTRODUODENOSCOPY WITH BIOPSY - FLEXIBLE    Location:  ENDO 4 / ENDO/GI    Surgeon:  Milon Dikes, MD          Post-Anesthesia Vitals  BP: 116/64 (07/22 1000)  Temp: 36.8 C (98.2 F) (07/22 0909)  Pulse: 72 (07/22 1000)  Respirations: 16 PER MINUTE (07/22 1000)  SpO2: 99 % (07/22 1000)  SpO2 Pulse: 72 (07/22 1000)   Vitals Value Taken Time   BP 116/64 11/11/2018 10:00 AM   Temp     Pulse 72 11/11/2018 10:00 AM   Respirations 16 PER MINUTE 11/11/2018 10:00 AM   SpO2 99 % 11/11/2018 10:00 AM         Post Anesthesia Evaluation Note    Evaluation location: Pre/Post  Patient participation: recovered; patient participated in evaluation  Level of consciousness: alert    Pain score: 5 (baseline pain is 7/10)  Pain management: adequate    Hydration: normovolemia  Temperature: 36.0C - 38.4C  Airway patency: adequate    Perioperative Events       Post-op nausea and vomiting: no PONV    Postoperative Status  Cardiovascular status: hemodynamically stable  Respiratory status: spontaneous ventilation  Follow-up needed: none        Perioperative Events  Perioperative Event: No  Emergency Case Activation: No

## 2018-11-12 ENCOUNTER — Ambulatory Visit: Admit: 2018-11-12 | Discharge: 2018-11-12

## 2018-11-12 ENCOUNTER — Encounter: Admit: 2018-11-12 | Discharge: 2018-11-12

## 2018-11-12 DIAGNOSIS — IMO0002 Ulcer: Secondary | ICD-10-CM

## 2018-11-12 DIAGNOSIS — D699 Hemorrhagic condition, unspecified: Secondary | ICD-10-CM

## 2018-11-12 DIAGNOSIS — M797 Fibromyalgia: Secondary | ICD-10-CM

## 2018-11-12 DIAGNOSIS — I119 Hypertensive heart disease without heart failure: Secondary | ICD-10-CM

## 2018-11-12 DIAGNOSIS — I8290 Acute embolism and thrombosis of unspecified vein: Secondary | ICD-10-CM

## 2018-11-12 DIAGNOSIS — K50919 Crohn's disease, unspecified, with unspecified complications: Secondary | ICD-10-CM

## 2018-11-12 DIAGNOSIS — I10 Essential (primary) hypertension: Secondary | ICD-10-CM

## 2018-11-12 DIAGNOSIS — Z885 Allergy status to narcotic agent status: Secondary | ICD-10-CM

## 2018-11-12 DIAGNOSIS — Z881 Allergy status to other antibiotic agents status: Secondary | ICD-10-CM

## 2018-11-12 DIAGNOSIS — Z882 Allergy status to sulfonamides status: Secondary | ICD-10-CM

## 2018-11-12 DIAGNOSIS — K639 Disease of intestine, unspecified: Secondary | ICD-10-CM

## 2018-11-12 DIAGNOSIS — Z794 Long term (current) use of insulin: Secondary | ICD-10-CM

## 2018-11-12 DIAGNOSIS — E119 Type 2 diabetes mellitus without complications: Secondary | ICD-10-CM

## 2018-11-12 DIAGNOSIS — G40909 Epilepsy, unspecified, not intractable, without status epilepticus: Secondary | ICD-10-CM

## 2018-11-12 DIAGNOSIS — K5900 Constipation, unspecified: Secondary | ICD-10-CM

## 2018-11-12 DIAGNOSIS — J302 Other seasonal allergic rhinitis: Secondary | ICD-10-CM

## 2018-11-12 DIAGNOSIS — K219 Gastro-esophageal reflux disease without esophagitis: Secondary | ICD-10-CM

## 2018-11-12 DIAGNOSIS — R42 Dizziness and giddiness: Secondary | ICD-10-CM

## 2018-11-12 DIAGNOSIS — M359 Systemic involvement of connective tissue, unspecified: Secondary | ICD-10-CM

## 2018-11-12 DIAGNOSIS — D4959 Neoplasm of unspecified behavior of other genitourinary organ: Secondary | ICD-10-CM

## 2018-11-12 DIAGNOSIS — Z90710 Acquired absence of both cervix and uterus: Secondary | ICD-10-CM

## 2018-11-12 DIAGNOSIS — K7581 Nonalcoholic steatohepatitis (NASH): Secondary | ICD-10-CM

## 2018-11-12 DIAGNOSIS — G43909 Migraine, unspecified, not intractable, without status migrainosus: Secondary | ICD-10-CM

## 2018-11-12 DIAGNOSIS — K5090 Crohn's disease, unspecified, without complications: Secondary | ICD-10-CM

## 2018-11-12 DIAGNOSIS — E139 Other specified diabetes mellitus without complications: Secondary | ICD-10-CM

## 2018-11-12 MED ORDER — FENTANYL CITRATE (PF) 50 MCG/ML IJ SOLN
50 ug | INTRAVENOUS | 0 refills | Status: CN | PRN
Start: 2018-11-12 — End: ?

## 2018-11-12 MED ORDER — PROPOFOL INJ 10 MG/ML IV VIAL
0 refills | Status: DC
Start: 2018-11-12 — End: 2018-11-12
  Administered 2018-11-12: 16:00:00 80 mg via INTRAVENOUS

## 2018-11-12 MED ORDER — OXYCODONE 5 MG PO TAB
5 mg | ORAL | 0 refills | Status: DC | PRN
Start: 2018-11-12 — End: 2018-11-12
  Administered 2018-11-12: 17:00:00 5 mg via ORAL

## 2018-11-12 MED ORDER — LACTATED RINGERS IV SOLP
1000 mL | Freq: Once | INTRAVENOUS | 0 refills | Status: CP
Start: 2018-11-12 — End: ?
  Administered 2018-11-12: 16:00:00 1000.000 mL via INTRAVENOUS

## 2018-11-12 MED ORDER — FENTANYL CITRATE (PF) 50 MCG/ML IJ SOLN
25 ug | INTRAVENOUS | 0 refills | Status: CN | PRN
Start: 2018-11-12 — End: ?

## 2018-11-12 MED ORDER — LIDOCAINE (PF) 200 MG/10 ML (2 %) IJ SYRG
0 refills | Status: DC
Start: 2018-11-12 — End: 2018-11-12
  Administered 2018-11-12: 16:00:00 60 mg via INTRAVENOUS

## 2018-11-12 MED ORDER — DIPHENHYDRAMINE HCL 50 MG/ML IJ SOLN
25 mg | Freq: Once | INTRAVENOUS | 0 refills | Status: CN | PRN
Start: 2018-11-12 — End: ?

## 2018-11-12 MED ORDER — PROPOFOL 10 MG/ML IV EMUL 20 ML (INFUSION)(AM)(OR)
INTRAVENOUS | 0 refills | Status: DC
Start: 2018-11-12 — End: 2018-11-12
  Administered 2018-11-12: 16:00:00 100 ug/kg/min via INTRAVENOUS

## 2018-11-12 NOTE — Discharge Planning (AHS/AVS)
Colon/Lower EUS/Retrograde Enteroscopy     Post Lower Endoscopy Instructions    -If you feel feverish, have a temperature of 101 degrees or higher, persistent nausea and vomiting, abdominal pain or dark stools; please notify your nurse or GI physician.    -You may have abdominal cramping following the procedure this can be relieved by belching or passing air.    -If you have redness or swelling at the IV site, place a warm, wet washcloth over the affected areas for 15 minutes, 3-4 times a day until the redness subsides.  If symptoms continue for 2-3 days, contact your regular physician.    - If you have bleeding from your bowels over 2 tablespoons and increasing, please notify your physician.  A small amount of bleeding is normal if a biopsy or polyps were taken.    - You may resume all your routine medications, if medications need to be held your physician and/or nurse will notify you post procedure.    SPECIFIC INSTRUCTIONS  INPATIENTS:  Ask for help when you get up in your room, as you may still be drowsy from your sedation.    OUTPATIENTS:  A. Because of sedation and lack of coordination, FOR THE NEXT 24 HOURS, DO NOT:  1. Operate any motorized vehicle - this includes driving.  2. Sign any legal documents or conduct important business matters.  3. Use any dangerous machinery (chain saw, lawnmower, etc.).  4. Drink any alcoholic beverages.  Should you have any questions or concerns after your procedure please call [phone] M-F 8am-5:00 pm. After 5:00 pm, holidays or weekends call [phone] and ask for the GI Doctor on call.

## 2018-11-12 NOTE — Anesthesia Post-Procedure Evaluation
Post-Anesthesia Evaluation    Name: Tina Snyder      MRN: [id number]     DOB: 22-Jan-1966     Age: 53 y.o.     Sex: female   __________________________________________________________________________     Procedure Information     Anesthesia Start Date/Time:  11/12/18 1033    Procedures:       COLONOSCOPY DIAGNOSTIC WITH SPECIMEN COLLECTION BY BRUSHING/ WASHING - FLEXIBLE (N/A )      COLONOSCOPY WITH BIOPSY - FLEXIBLE    Location:  ENDO 3 / ENDO/GI    Surgeon:  Lanney Gins, MD          Post-Anesthesia Vitals      Vitals Value Taken Time   BP 130/56 11/12/2018 11:11 AM   Temp     Pulse 87 11/12/2018 11:13 AM   Respirations 16 PER MINUTE 11/12/2018 11:13 AM   SpO2 94 % 11/12/2018 11:12 AM   Vitals shown include unvalidated device data.      Post Anesthesia Evaluation Note    Evaluation location: other  Patient participation: recovered; patient participated in evaluation  Level of consciousness: alert    Pain score: 0  Pain management: adequate    Hydration: normovolemia  Temperature: 36.0C - 38.4C  Airway patency: adequate    Perioperative Events      Postoperative Status  Cardiovascular status: hemodynamically stable  Respiratory status: spontaneous ventilation        Perioperative Events  Perioperative Event: No  Emergency Case Activation: No

## 2018-11-12 NOTE — H&P (View-Only)
Pre Procedure History and Physical/Sedation Plan    Name:Tina Snyder                                                                   MRN: [id number]                 DOB:[birth year]          Age: 53 y.o.  Date of Service: 11/12/18    Date of Procedure:  11/12/2018    Planned Procedure(s):  GI:  Colonoscopy  Sedation/Medication Plan: MAC (Monitored Anesthesia Care)  Discussion/Reviews:  Physician has discussed risks and alternatives of this type of sedation and above planned procedures with patient  ___________________________________________________________________  Chief Complaint:  Crohn's disease     History of Present Illness: Tina Snyder is a 52 y.o. female Crohn's disease s/p surgery/reflux symptoms/weight gain/here for assessment of disease activity. Currently on budesonide.     Previous Anesthetic/Sedation History:  Reviewed     Medical History:   Diagnosis Date   ??? Autoimmune disease (HCC)     Chron's   ??? Bleeding tendency (HCC)    ??? Blood clot in vein     subclavian   ??? Bowel disease    ??? Crohn disease (HCC)    ??? Dizziness    ??? DM (diabetes mellitus), secondary-steroid-induced 2012   ??? Fibromyalgia    ??? GERD (gastroesophageal reflux disease)    ??? Hypertension    ??? Hypertensive heart disease    ??? Migraines    ??? Seasonal allergic reaction    ??? Seizure disorder (HCC)    ??? Steatohepatitis     felt secondary to medications   ??? Type 2 diabetes mellitus without complication, with long-term current use of insulin (HCC) 10/22/2017   ??? Ulcer    ??? Vaginal tumor      Surgical History:   Procedure Laterality Date   ??? HX ADENOIDECTOMY  05/1995   ??? LUNG SURGERY  10/22/2010    lung biopsy   ??? LIVER BIOPSY  12/2010   ??? ESOPHAGOGASTRODUODENOSCOPY N/A 03/13/2015    Performed by Elyn Aquas, MD at [facility] ENDO   ??? COLONOSCOPY N/A 03/13/2015    Performed by Elyn Aquas, MD at [facility] ENDO   ??? ESOPHAGOGASTRODUODENOSCOPY BIOPSY  03/13/2015    Performed by Elyn Aquas, MD at [facility] ENDO   ??? COLONOSCOPY BIOPSY  03/13/2015 Performed by Elyn Aquas, MD at [facility] ENDO   ??? ESOPHAGOGASTRODUODENOSCOPY WITH SPECIMEN COLLECTION BY BRUSHING/ WASHING N/A 06/30/2017    Performed by Jolee Ewing, MD at [facility] ENDO   ??? COLONOSCOPY DIAGNOSTIC WITH SPECIMEN COLLECTION BY BRUSHING/ WASHING - FLEXIBLE N/A 06/30/2017    Performed by Jolee Ewing, MD at [facility] ENDO   ??? ESOPHAGOGASTRODUODENOSCOPY WITH BIOPSY - FLEXIBLE N/A 06/30/2017    Performed by Jolee Ewing, MD at [facility] ENDO   ??? COLONOSCOPY WITH BIOPSY - FLEXIBLE  06/30/2017    Performed by Jolee Ewing, MD at [facility] ENDO   ??? APPENDECTOMY     ??? BRONCHOSCOPY     ??? CHOLECYSTECTOMY     ??? GASTRIC FUNDOPLICATION     ??? HEMORRHOIDECTOMY     ??? HX BREAST LUMPECTOMY     ??? HX ENDOSCOPY      colonoscopy  2014   ??? HX HYSTERECTOMY      partial 02/1995, ovaries removed 1999   ??? OOPHORECTOMY      1999 ovaries removed   ??? SMALL BOWEL RESECTION     ??? SUBTOTAL COLECTOMY      Ileocolectomy      Pertinent medical/surgical history reviewed  Pertinent family history reviewed  Social History     Tobacco Use   ??? Smoking status: Never Smoker   ??? Smokeless tobacco: Never Used   Substance Use Topics   ??? Alcohol use: No     Alcohol/week: 0.0 standard drinks   ??? Drug use: No     Social History     Substance and Sexual Activity   Drug Use No     Allergies:  Bee venom protein (honey bee); Morphine; Bee [bumble bee]; Ciprofloxacin; Compazine [prochlorperazine]; Coumadin [warfarin]; Flagyl [metronidazole]; Lovenox [enoxaparin]; Reglan [metoclopramide]; Stadol [butorphanol tartrate]; Sulfa (sulfonamide antibiotics); Tysabri [natalizumab]; Walnut; Zyrtec [cetirizine]; and Pecan nut  Medications  Current Facility-Administered Medications   Medication   ??? lactated ringers infusion     Review of Systems:  All other systems reviewed and are negative.           Physical Exam:  Temp: 36.5 ???C (97.7 ???F) (07/23 0919)  Pulse: 88 (07/23 0919)  Respirations: 16 PER MINUTE (07/23 0919)  BP: 144/87 (07/23 0919) General appearance: alert, well-developed and cooperative  Throat: Lips, mucosa, and tongue normal. Teeth and gums normal  Lungs: no tachypnea  Heart: regular rate and rhythm  Abdomen: soft, non-tender. Bowel sounds normal. No masses,  no organomegaly  Extremities: extremities normal, atraumatic, no cyanosis or edema  @  Airway:  airway assessment performed  Mallampati II (soft palate, uvula, fauces visible)  Anesthesia Classification:  Per Anesthesia  NPO Status: Acceptable  Pregnancy Status: Not Pregnant    Lab/Radiology/Other Diagnostic Tests  Labs:  Relevant labs reviewed      Lamonte Richer, MBBS  Pager

## 2018-11-13 ENCOUNTER — Encounter: Admit: 2018-11-13 | Discharge: 2018-11-13

## 2018-11-13 DIAGNOSIS — K219 Gastro-esophageal reflux disease without esophagitis: Secondary | ICD-10-CM

## 2018-11-13 DIAGNOSIS — G43909 Migraine, unspecified, not intractable, without status migrainosus: Secondary | ICD-10-CM

## 2018-11-13 DIAGNOSIS — D4959 Neoplasm of unspecified behavior of other genitourinary organ: Secondary | ICD-10-CM

## 2018-11-13 DIAGNOSIS — E139 Other specified diabetes mellitus without complications: Secondary | ICD-10-CM

## 2018-11-13 DIAGNOSIS — I10 Essential (primary) hypertension: Secondary | ICD-10-CM

## 2018-11-13 DIAGNOSIS — D699 Hemorrhagic condition, unspecified: Secondary | ICD-10-CM

## 2018-11-13 DIAGNOSIS — E119 Type 2 diabetes mellitus without complications: Secondary | ICD-10-CM

## 2018-11-13 DIAGNOSIS — IMO0002 Ulcer: Secondary | ICD-10-CM

## 2018-11-13 DIAGNOSIS — K5090 Crohn's disease, unspecified, without complications: Secondary | ICD-10-CM

## 2018-11-13 DIAGNOSIS — G40909 Epilepsy, unspecified, not intractable, without status epilepticus: Secondary | ICD-10-CM

## 2018-11-13 DIAGNOSIS — I8290 Acute embolism and thrombosis of unspecified vein: Secondary | ICD-10-CM

## 2018-11-13 DIAGNOSIS — M797 Fibromyalgia: Secondary | ICD-10-CM

## 2018-11-13 DIAGNOSIS — J302 Other seasonal allergic rhinitis: Secondary | ICD-10-CM

## 2018-11-13 DIAGNOSIS — R42 Dizziness and giddiness: Secondary | ICD-10-CM

## 2018-11-13 DIAGNOSIS — K639 Disease of intestine, unspecified: Secondary | ICD-10-CM

## 2018-11-13 DIAGNOSIS — K7581 Nonalcoholic steatohepatitis (NASH): Secondary | ICD-10-CM

## 2018-11-13 DIAGNOSIS — M359 Systemic involvement of connective tissue, unspecified: Secondary | ICD-10-CM

## 2018-11-15 ENCOUNTER — Encounter: Admit: 2018-11-15 | Discharge: 2018-11-15

## 2018-11-16 ENCOUNTER — Encounter: Admit: 2018-11-16 | Discharge: 2018-11-16

## 2018-11-16 ENCOUNTER — Ambulatory Visit: Admit: 2018-11-16 | Discharge: 2018-11-16

## 2018-11-16 DIAGNOSIS — R609 Edema, unspecified: Principal | ICD-10-CM

## 2018-11-16 DIAGNOSIS — M797 Fibromyalgia: Secondary | ICD-10-CM

## 2018-11-16 DIAGNOSIS — J302 Other seasonal allergic rhinitis: Secondary | ICD-10-CM

## 2018-11-16 DIAGNOSIS — K639 Disease of intestine, unspecified: Secondary | ICD-10-CM

## 2018-11-16 DIAGNOSIS — E7800 Pure hypercholesterolemia, unspecified: Secondary | ICD-10-CM

## 2018-11-16 DIAGNOSIS — K5090 Crohn's disease, unspecified, without complications: Secondary | ICD-10-CM

## 2018-11-16 DIAGNOSIS — E119 Type 2 diabetes mellitus without complications: Secondary | ICD-10-CM

## 2018-11-16 DIAGNOSIS — R42 Dizziness and giddiness: Secondary | ICD-10-CM

## 2018-11-16 DIAGNOSIS — N183 Chronic kidney disease, stage 3 (moderate): Secondary | ICD-10-CM

## 2018-11-16 DIAGNOSIS — K219 Gastro-esophageal reflux disease without esophagitis: Secondary | ICD-10-CM

## 2018-11-16 DIAGNOSIS — I82B12 Acute embolism and thrombosis of left subclavian vein: Secondary | ICD-10-CM

## 2018-11-16 DIAGNOSIS — M359 Systemic involvement of connective tissue, unspecified: Secondary | ICD-10-CM

## 2018-11-16 DIAGNOSIS — I10 Essential (primary) hypertension: Secondary | ICD-10-CM

## 2018-11-16 DIAGNOSIS — G4733 Obstructive sleep apnea (adult) (pediatric): Secondary | ICD-10-CM

## 2018-11-16 DIAGNOSIS — D699 Hemorrhagic condition, unspecified: Secondary | ICD-10-CM

## 2018-11-16 DIAGNOSIS — G40909 Epilepsy, unspecified, not intractable, without status epilepticus: Secondary | ICD-10-CM

## 2018-11-16 DIAGNOSIS — I8290 Acute embolism and thrombosis of unspecified vein: Secondary | ICD-10-CM

## 2018-11-16 DIAGNOSIS — I890 Lymphedema, not elsewhere classified: Secondary | ICD-10-CM

## 2018-11-16 DIAGNOSIS — IMO0002 Ulcer: Secondary | ICD-10-CM

## 2018-11-16 DIAGNOSIS — E139 Other specified diabetes mellitus without complications: Secondary | ICD-10-CM

## 2018-11-16 DIAGNOSIS — I87009 Postthrombotic syndrome without complications of unspecified extremity: Secondary | ICD-10-CM

## 2018-11-16 DIAGNOSIS — D4959 Neoplasm of unspecified behavior of other genitourinary organ: Secondary | ICD-10-CM

## 2018-11-16 DIAGNOSIS — G43909 Migraine, unspecified, not intractable, without status migrainosus: Secondary | ICD-10-CM

## 2018-11-16 DIAGNOSIS — K7581 Nonalcoholic steatohepatitis (NASH): Secondary | ICD-10-CM

## 2018-11-16 DIAGNOSIS — R601 Generalized edema: Secondary | ICD-10-CM

## 2018-11-16 LAB — BASIC METABOLIC PANEL
Lab: 1.8 mg/dL — ABNORMAL HIGH (ref 0.4–1.00)
Lab: 137 MMOL/L (ref 137–147)
Lab: 22 mg/dL (ref 7–25)
Lab: 226 mg/dL — ABNORMAL HIGH (ref 70–100)
Lab: 26 MMOL/L (ref 21–30)
Lab: 30 mL/min — ABNORMAL LOW (ref >60)
Lab: 36 mL/min — ABNORMAL LOW (ref >60)
Lab: 4.2 MMOL/L (ref 3.5–5.1)
Lab: 9.8 mg/dL (ref 8.5–10.6)
Lab: 11 (ref 3–12)

## 2018-11-16 LAB — HEMOGLOBIN A1C: Lab: 9.1 % — ABNORMAL HIGH (ref 4.0–6.0)

## 2018-11-16 MED ORDER — BUMETANIDE 1 MG PO TAB
1 mg | ORAL_TABLET | Freq: Every day | ORAL | 1 refills | Status: DC
Start: 2018-11-16 — End: 2018-12-03

## 2018-11-16 NOTE — Patient Instructions
-   It was nice to see you today!    - Let's stop hydralazine and clonidine. Please monitor your blood pressure and call if you are getting consistent readings greater than 130/80. Try taking the Bystolic in the am and Diltiazem at bedtime.     - Let's recheck labs today after reducing the Bumex dose back down to 1 mg daily.     - I have requested a 3 month follow up visit with Dr. Trudee Kuster. Please send [place] a MyChart message or call our nurse line at [phone] with any questions or concerns in the meantime.

## 2018-11-16 NOTE — Progress Notes
Date of Service: 11/16/2018    Tina Snyder is a 53 y.o. female.       HPI    I had the pleasure of seeing???Tina Snyder???today???for cardiac follow up. I last saw her in October 2019. She is a very pleasant 53 year old female who initially saw Dr. Vivianne Spence???in March???for evaluation after an episode of syncope in December 2018 with ankle fracture. Labs that day revealed Hg 7.4???(previously was 12.6 in June 2018). ???She was hospitalized for evaluation and treatment. ???She has a complex past medical history. 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 Work-up in March was consistent with iron deficiency anemia. ???Hematology was consulted and concerned about her ability to absorb oral iron supplementation. ???She was continued on vitamin B12 supplementation. ???Echo was unremarkable, showing hyperdynamic left ventricular systolic function with LVEF 70%. ???There were no other abnormalities. ???Carotid ultrasound was unremarkable. ???Left arm ultrasound was negative for DVT. ???She had increased bilateral upper extremity edema in the setting of fluid administration and blood transfusion. ???VQ scan was negative for PE. 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 ???CT from December 2016, 4 years after her clotting event,???showed chronic stasis with multiple venous collaterals.???She was diagnosed with sleep apnea last year and is using a CPAP machine. She has some discoloration of her right arm since her hospitalization in March.  She saw Dr. Hollie Beach in August for evaluation and he ordered a CTA of the chest/abdomen and pelvis.  She had a chronic left innominate vein narrowing noted, but no other findings to suggest cause of her generalized swelling. Her right arm is actually larger than her left arm.     Tina Snyder would like to simplify her medication regimen, if possible.  She is only taking hydralazine once daily.  She is also wondering about clonidine.  Her Bumex dose had been increased to 2 mg daily for worsened edema, but labs on 10/30/2018 showed an acute rise in her creatinine to 1.95 and she is now back to taking 1 mg daily.  A lipid panel on 10/30/2018 showed LDL 75, HDL 35 and triglycerides 272.  Her blood glucose was 283 when those labs were drawn.  She has been having heartburn symptoms and underwent EGD and colonoscopy last week.  EGD showed normal esophagus.  There was erosive gastropathy seen.  She is taking Protonix twice daily.  She is going to see Dr. Janene Snyder next month.  She underwent colonoscopy on 7/22, but preparation of the colon was poor and she underwent another day of prep and repeat colonoscopy on 7/23 still showed a large amount of stool present.  It was felt that the severe constipation may be contributing to her current symptoms.  She is weaning off of budesonide. She still notes some lightheadedness, particularly when she stands.  She notes an occasional skipped beat.  She takes all of her medications in the morning.  The lightheadedness is not associated with blood sugar abnormalities or low blood pressure.  She has stable exertional shortness of breath.  She is going to lymphedema physical therapy twice weekly and notes some improvement in the lymphedema.  She tells me that her lymph  nodes are enlarged.  She saw Dr. Annia Belt on 7/14 and is pursuing evaluation at [facility] regarding the lymphedema. She was evaluated there ~20 years ago regarding treatment for Crohn's disease. Psychologically, staying at home due to the coronavirus pandemic has been difficult.  Dr. Annia Belt switched her from Prozac to Wellbutrin.         Vitals:    11/16/18 1025   BP: 122/78   BP Source: Arm, Right Upper   Pulse: 81   SpO2: 98%   Weight: 104.8 kg (231 lb)   Height: 1.626 m (5' 4)   PainSc: Zero     Body mass index is 39.65 kg/m???.     Past Medical History  Patient Active Problem List    Diagnosis Date Noted   ??? Morbid (severe) obesity due to excess calories (HCC) 10/29/2018   ??? Bronchiectasis without complication (HCC) 10/29/2018   ??? Essential (hemorrhagic) thrombocythemia (HCC) 10/29/2018   ??? Peripheral edema 10/29/2018   ??? Generalized edema 11/27/2017   ??? 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 Closed fracture of right foot/leg wearing brace; with delayed healing 18Dec.2018, at noon dizzy, fell back wards with leg back under her; fractured R foot/leg; following with orthopedics 11/09/2017     7.15.19 Right ankle not healing awaiting bone stimulator; if this does not work then will do surgery and pin it       ??? 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   She had two ultrasounds in 2019 which demonstated no acute thrombosis.   She is starting lymphedema OT in 2019.   Referral to vascular surgery for whether she is a candidate for venous bypass or intervention of the proximal stenosis to help manage the lymphedema.      ??? Type 2 diabetes mellitus without complication, with long-term current use of insulin (HCC) 10/22/2017   ??? OSA (obstructive sleep apnea) 10/20/2017     Sleep study done on 10/10/2017 which showed AHI of 133/h.  Time with saturation less than or equal to 88% for 70 minutes.  CPAP at a pressure of 14 cm was found to be effective.     DME- [employer] Compliance report today showed 93% usage for more than 4 hours average daily use of 8 hours and 5 minutes out of CPAP 5-12 cm of water AHI residual 0.5 events per hour     ??? Obesity (BMI 30-39.9) 10/20/2017     BMI 37.08    BMI 39     ??? Pure hypercholesterolemia 10/17/2017   ??? Lymphedema of both arms 10/15/2017     Bilateral upper extremities:  She had evidence of narrowing or blockage of her left brachiocephalic and right internal jugular vein on CT with contrast in 2016.   She had two ultrasounds in 2019 which demonstated no acute thrombosis.   She is starting lymphedema OT in 2019.   Referral to vascular surgery for whether she is a candidate for venous bypass or intervention of the proximal stenosis to help manage the lymphedema     ??? Fracture of right lower leg-fibula 18Dec.2018 06/28/2017   ??? Enteritis 03/27/2016   ??? Parotitis 10/18/2015   ??? CKD (chronic kidney disease) stage 3, GFR 30-59 ml/min (HCC) 10/18/2015   ??? 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 DVT (  deep venous thrombosis) (HCC) 10/31/2010   ??? Anemia 10/30/2010   ??? Seizure disorder (HCC) 10/30/2010   ??? HTN (hypertension) 10/16/2010   ??? Depression 10/16/2010   ??? Crohn's disease (HCC) 09/13/2007     Review of Systems   Constitution: Positive for malaise/fatigue.   HENT: Negative.    Eyes: Negative.    Cardiovascular: Negative.    Respiratory: Positive for shortness of breath.    Endocrine: Negative.    Hematologic/Lymphatic: Negative.    Skin: Negative.    Musculoskeletal: Negative.    Gastrointestinal: Negative.    Genitourinary: Negative.    Neurological: Negative.    Psychiatric/Behavioral: Negative.    Allergic/Immunologic: Negative.      Physical Exam  General Appearance: no acute distress  Skin: warm & intact  HEENT: unremarkable  Neck Veins: neck veins are flat & not distended  Chest Inspection: respirations are even and unlabored. Auscultation/Percussion: lungs clear to auscultation, no rales, rhonchi, or wheezing  Cardiac Rhythm: regular rhythm & normal rate  Cardiac Auscultation: Normal S1 & S2, no S3 or S4, no rub  Murmurs: no cardiac murmurs   Extremities: no pitting lower extremity edema; right arm lymphedema is improved from when I saw her in the past.   Abdominal Exam: soft, bowel sounds normal  Neurologic Exam: oriented to time, place and person; no focal neurologic deficits  Psychiatric: Normal mood and affect.  Behavior is normal. Judgment and thought content normal.     Cardiovascular Studies  ECG: sinus rhythm with HR 81 bpm. Probable left atrial enlargement. RSR prime. No interval change.     Problems Addressed Today  Encounter Diagnoses   Name Primary?   ??? Essential hypertension Yes   ??? Peripheral edema    ??? Lymphedema of both arms    ??? Generalized edema    ??? OSA (obstructive sleep apnea)    ??? Occlusion of left subclavian vein (HCC)    ??? Postthrombotic syndrome    ??? Pure hypercholesterolemia    ??? CKD (chronic kidney disease) stage 3, GFR 30-59 ml/min (HCC)      Assessment and Plan      In conclusion, Ms. Longcore has multiple medical issues. Her blood pressure is excellent today.  She is noting some orthostatic lightheadedness. She has only been taking hydralazine once daily and I am going to stop it today. She also is only taking clonidine once daily and I will stop it.  I have encouraged her to start taking diltiazem CD 240 mg at bedtime and continue Bystolic 10 mg in the morning. She is also taking spironolactone 25 mg daily. She will monitor her blood pressure and contact us if she is getting consistent readings greater than 130/80.    She has issues with generalized edema/lymphedema.  She feels like her fluid status has improved since she was switched to Bumex- likely related to improved absorption.  She had acute kidney injury after her Bumex dose was increased to 2 mg daily and is now back to taking 1 mg daily.  She is going to lymphedema physical therapy and feels like that has been helpful.  Her lymphedema today looks improved since last year. I am going to check a BMP today. She is seeking evaluation at [facility] clinic.     Her lipid panel earlier this month was satisfactory. She will continue atorvastatin 40 mg daily and follow up with the diabetes services team regarding management of her diabetes.     She was hospitalized in March 2019  with iron deficiency anemia. ???It was felt that this was likely due to an absorption problem and she will likely need IV iron supplementation in the future.    I have requested a 75-month follow-up visit with Dr. Vivianne Spence, but I have encouraged her to send me a MyChart message with an update on her blood pressures after making these changes today.  ???           Current Medications (including today's revisions)  ??? ALPRAZolam (XANAX) 0.25 mg tablet Take one tablet by mouth at bedtime as needed for Anxiety.   ??? atorvastatin (LIPITOR) 40 mg tablet Take one tablet by mouth daily.   ??? blood sugar diagnostic (ONETOUCH VERIO) test strip Use 1 Strip as directed before meals and at bedtime.   ??? Blood-Glucose Sensor (DEXCOM G6 SENSOR) devi Use 1 each as directed as directed. Change every 10 days   ??? budesonide (ENTOCORT EC) 3 mg capsule TAKE 3 CAPSULES BY MOUTH IN THE MORNING (Patient taking differently: 3 mg every morning.)   ??? bumetanide (BUMEX) 1 mg tablet Take one tablet by mouth daily.   ??? buPROPion SR (WELLBUTRIN-SR) 150 mg tablet Take one tablet by mouth daily.   ??? cyclobenzaprine (FLEXERIL) 10 mg tablet Take 10 mg by mouth daily as needed. Marland Kitchen   ??? DEXCOM G6 RECEIVER misc Use 1 each as directed daily.   ??? DEXCOM G6 TRANSMITTER devi Use 1 each as directed as directed. Every 90 days   ??? diltiazem CD (CARDIZEM CD) 240 mg capsule Take 240 mg by mouth daily.   ??? duloxetine DR (CYMBALTA) 60 mg capsule Take 60 mg by mouth daily with breakfast. ??? HUMULIN R U-500 (CONC) KWIKPEN 500 unit/mL (3 mL) injection PEN Take 280 units before breakfast, 210 units before lunch, and 135 units before dinner. Take about 20 minutes prior to the meal   ??? insulin pen needles (disposable) (B-D NANO) 32 gauge x 5/32 pen needle Use one each as directed five times daily. Use with insulin injections.   ??? lancets (ONE TOUCH DELICA) MISC Use 1 Each as directed before meals and at bedtime. Diag: Diabetes mellitus (E.11)   ??? nebivolol (BYSTOLIC) 10 mg tablet Take one tablet by mouth daily.   ??? pantoprazole DR (PROTONIX) 40 mg tablet Take one tablet by mouth twice daily. Indications: gastroesophageal reflux disease   ??? spironolactone (ALDACTONE) 50 mg tablet Take 50 mg by mouth daily.   ??? temazepam (RESTORIL) 15 mg capsule Take 15 mg by mouth at bedtime as needed.

## 2018-11-18 ENCOUNTER — Ambulatory Visit: Admit: 2018-11-18 | Discharge: 2018-11-18

## 2018-11-18 DIAGNOSIS — E119 Type 2 diabetes mellitus without complications: Secondary | ICD-10-CM

## 2018-11-18 MED ORDER — RXAMB AMITR/GABAPEN/EMU OIL 4/4/10% CREAM (COMPOUND)
3 refills | 22.50000 days | Status: DC
Start: 2018-11-18 — End: 2019-06-02

## 2018-11-19 ENCOUNTER — Encounter: Admit: 2018-11-19 | Discharge: 2018-11-19

## 2018-11-19 NOTE — Progress Notes
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, Steroid Induced  Date of Diagnosis: 2012    HgA1c HISTORY:  Lab Results   Component Value Date    HGBA1C 9.1 (H) 11/16/2018    HGBA1C 9.3 (H) 10/24/2015    HGBA1C 9.2 (H) 10/22/2015      Lab Results   Component Value Date    A1C 9.2 03/27/2018    A1C 7.6 11/03/2017    A1C 7.8 06/20/2017       LIPID PROFILE:  Lab Results   Component Value Date    CHOL 135 10/30/2018    HDL 35 (L) 10/30/2018    LDL 75 10/30/2018    VLDL 54 10/30/2018    TRIG 161 (H) 10/30/2018       WEIGHT HISTORY:  Wt Readings from Last 3 Encounters:   11/16/18 104.8 kg (231 lb)   11/12/18 101.2 kg (223 lb)   11/11/18 101.2 kg (223 lb)     BMI Readings from Last 1 Encounters:   11/16/18 39.65 kg/m???       Medication Comment: u500 insulin, 280/210/135   Pt. Missing Doses of Diabetes Meds: No   Past Diabetes Treatment: glyburide, Tresiba 290u QD, Novolog 18/24/30 + 8:40/140    Injection/Infusion Set Locations: Abdomen    Rotation Injection/Infusion Set Locations: rotates around limited sites in lower abdomen    Injection/Infusion Administration: Disposable pens    Insulin Pump: No - asks about Omnipod, which friend has                 BG Monitoring Frequency: personal CGM   Source: Per Patient Verbal Report   Range per Meter: N/A   Continuous Glucose Monitor? Dexcom    Frequency Worn: 93%    Continuous Glucose Monitoring Analysis and Interpretation    Name/Type of Device Placed: Dexcom G5/G6    Date of Print out: 11/18/18 Continuous glucose monitor downloaded and personally reviewed by me on 11/18/18.    Analysis of Data    Date Range: 11/03/18 - 11/16/18    Sensor usage time (%): 93%    Average glucose (mg/dL): 096    Coefficient of Variation (%): 34    Standard Deviation (mg/dL): 91     Time in range (%): 20    Time above range (%): 79   Time above 250mg /dL (%): 53    Time below range (%): <1   Time below 54mg /dL (%): <1    Assessment  Trends/Patterns observed  ? Hyperglycemia: trends high after lunch, persists overnight  ? Hypoglycemia: 2 episodes of hypoglycemia, mid-afternoon and morning - r/t missed meals d/t bowel prep and GI pain    Plan  Treatment changes: will review CGM trends with [name], PA-C for advice on insulin adjustments. Pt will d/c budesonide on 8/12.      Hypoglycemia  Hypoglycemia frequency and severity (<70 mg/dL): two episodes of hypoglycemia in past two weeks   Hypoglycemia Unawareness? No   At what BG level pt experiences symptoms? 75-80    Treatment of hypoglycemia: eat something - snack: fruits/vegetables OR  chips & salsa   Hypoglycemia comment: related to bowel prep for upper/lower GI scopes -- did not adjust u500 insulin    Hyperglycemia  Using CF for hyperglycemia? Yes    ISF: not using while taking u500 insulin   Hyperglycemia comment: budesonide daily; regular soda ~1x/week, occasional snacking on chips and no bolus    Typical diet:    Breakfast AM Snacks Lunch PM Snacks Dinner Evening   Peanut butter toast  Fried chicken, mashed potatoes, gravy  Ham, roasted vegetables      Diet Comments: Eats three meals per day, may snack around 3:00 pm when she gets hungry. Regular soda about once per week. Some modifications during Crohn's flare up but may continue raw vegetables and other grains. Avoids artificial sweeteners. Has stopped drinking grapefruit juice with morning medications.  Timing of Bolus: Before Meal  Bolus Comment: states some boluses cause her to bruise or bleed Does pt count carbohydrates?: No   Insulin to carb ratio? No      Type, Frequency, Duration of Exercise: walks 3-4 times per day for 5-10 minutes    Barriers to Treatment: Educational, Health, Knowledge and Physical Limitations    Specific Areas of Concern Today: 53 y/o female with peripheral edema, CKD stage 2-3, Crohn's, OSA on CPAP, HTN, T2DM. Long term budesonide, weaning off and will d/c on 8/12. Worsening lymphedema which her PCP would like to have evaluated at [facility]. Pt reports [facility] will not take her. Describes increased anxiety, panic attacks, and frequently crying related to stress of health complications. States she has people to talk to and will often go outside to relieve stress. Increased GI pain related to hiatal hernia. Follow up appointment with Dr. Janene Madeira in GI September 2020.     Intervention  Learners Present: Patient, Significant Other and Son/Daughter  Status of Diabetes Education Program? Never enrolled     Handouts Given: Sick Days    Education provided:  Disease Process: Treatment Options 1  Carbohydrate Counting: Avoid sugary drinks, Estimating portion sizes, Fiber and Main source of carbohydrate 1  Healthy Eating: Sources of fiber, Increase fruits/vegetables and Including protein 3  Physical Activity: Effect on BG 2  Medications: Calculating insulin doses, Importance of compliance, Proper administration, Site selection/rotation and Treat-to-Target 2  Blood Glucose Monitoring: Pattern Management, Personal targets and When to contact HCP 2  Acute Complications: Hyperglycemia: Causes and prevention, Correction via injection, Drink Fluids and Using ISF 3  Acute Complications: Hypoglycemia: Causes and prevention, Fast-acting carbohydrates, Rule of 15 and Signs and symptoms 2  Sick Day Management: Effect of illness on BG, Adjustment in eating/drinking, Adjustment of medication, Fluid intake sugar-free vs sugary and When to contact HCP 2 Insulin Pumps and Continuous Glucose Monitoring: Potential risks/benefits 1  Chronic Complications: Development and prevention 2  Psychosocial Adjustment: Effect of stress on BG and Problem solving 2  Preconception Care: NA  Developing strategies to promote health & behavior change: 3    Pre-session assessment shown in parentheses following content area assessed  1=needs improvement   2=needs review   3=comprehends key points   4=demonstrates understanding   NC=not covered   N/A=not applicable    There are no Patient Instructions on file for this visit.    Diabetes Management Support Plan: Attend PCP and Diabetes Provider appointments regularly and Return for f/u with educator    Diabetes Program Outcome Monitoring and Evaluation: A1C       Program Outcome at Baseline: 6.8   Program Outcome at Follow up:  9.1    Did Diabetes Program Outcome Improve?: No       Patient-Selected Behavior Change Objective Area: Taking Medications   Patient-Selected Behavior Change Goal: use new injection sites     Patient-Selected Behavior Change Goal Baseline % Attainment: 0%    Start Time: 1:30  Stop Time: 2:00    Monitoring and Evaluation    Follow-up: Return in about 3 weeks (around 12/09/2018) for Telehealth.

## 2018-11-20 ENCOUNTER — Ambulatory Visit: Admit: 2018-11-20 | Discharge: 2018-11-20

## 2018-11-20 ENCOUNTER — Encounter: Admit: 2018-11-20 | Discharge: 2018-11-20

## 2018-11-20 DIAGNOSIS — R599 Enlarged lymph nodes, unspecified: Secondary | ICD-10-CM

## 2018-11-24 ENCOUNTER — Encounter: Admit: 2018-11-24 | Discharge: 2018-11-24

## 2018-11-24 ENCOUNTER — Ambulatory Visit: Admit: 2018-11-24 | Discharge: 2018-11-25

## 2018-11-24 DIAGNOSIS — G40909 Epilepsy, unspecified, not intractable, without status epilepticus: Secondary | ICD-10-CM

## 2018-11-24 DIAGNOSIS — D699 Hemorrhagic condition, unspecified: Secondary | ICD-10-CM

## 2018-11-24 DIAGNOSIS — I10 Essential (primary) hypertension: Secondary | ICD-10-CM

## 2018-11-24 DIAGNOSIS — Z79899 Other long term (current) drug therapy: Secondary | ICD-10-CM

## 2018-11-24 DIAGNOSIS — J302 Other seasonal allergic rhinitis: Secondary | ICD-10-CM

## 2018-11-24 DIAGNOSIS — E119 Type 2 diabetes mellitus without complications: Secondary | ICD-10-CM

## 2018-11-24 DIAGNOSIS — M797 Fibromyalgia: Secondary | ICD-10-CM

## 2018-11-24 DIAGNOSIS — I8290 Acute embolism and thrombosis of unspecified vein: Secondary | ICD-10-CM

## 2018-11-24 DIAGNOSIS — R42 Dizziness and giddiness: Secondary | ICD-10-CM

## 2018-11-24 DIAGNOSIS — M359 Systemic involvement of connective tissue, unspecified: Secondary | ICD-10-CM

## 2018-11-24 DIAGNOSIS — K5090 Crohn's disease, unspecified, without complications: Secondary | ICD-10-CM

## 2018-11-24 DIAGNOSIS — E139 Other specified diabetes mellitus without complications: Secondary | ICD-10-CM

## 2018-11-24 DIAGNOSIS — K7581 Nonalcoholic steatohepatitis (NASH): Secondary | ICD-10-CM

## 2018-11-24 DIAGNOSIS — K219 Gastro-esophageal reflux disease without esophagitis: Secondary | ICD-10-CM

## 2018-11-24 DIAGNOSIS — G43909 Migraine, unspecified, not intractable, without status migrainosus: Secondary | ICD-10-CM

## 2018-11-24 DIAGNOSIS — IMO0002 Ulcer: Secondary | ICD-10-CM

## 2018-11-24 DIAGNOSIS — D4959 Neoplasm of unspecified behavior of other genitourinary organ: Secondary | ICD-10-CM

## 2018-11-24 DIAGNOSIS — K639 Disease of intestine, unspecified: Secondary | ICD-10-CM

## 2018-11-24 DIAGNOSIS — A310 Pulmonary mycobacterial infection: Secondary | ICD-10-CM

## 2018-11-24 MED ORDER — DEXLANSOPRAZOLE 60 MG PO CPDB
60 mg | ORAL_CAPSULE | Freq: Every day | ORAL | 5 refills | 30.00000 days | Status: DC
Start: 2018-11-24 — End: 2019-04-07

## 2018-11-24 NOTE — Progress Notes
Telehealth Visit Note    Date of Service: 11/24/2018    Subjective:      Obtained patient's verbal consent to treat them and their agreement to [facility] financial policy and NPP via this telehealth visit during the [facility] Emergency       Tina Snyder is a 53 y.o. female.    History of Present Illness  53 year old female with history of fistulizing ileocolonic Crohn's disease who is seen today in follow-up via telehealth.  She was last seen in GI clinic 10/01/2018.  At that time, it was decided to attempt to taper her off of her budesonide at the request of endocrinology due to difficult to control blood sugars.  This was attempted as her symptomatology has been relatively stable.  She was to taper down to 6 g daily for a month and then 3 g daily for the month.  She has 8 days left of her 3 mg dose then she will taper off.  She describes since beginning to taper she has felt crummy.  She has increased frequency of diarrhea having 6-7 stools per day (prior she was having 3-4 stools a day before tapering).  She has had increased cramping of her right lower quadrant.  She denies any hematochezia.  Denies weight loss.  She also reports persistent reflux symptoms in the form of heartburn and water brash.  She has been persistent and taking her Protonix twice daily and occasionally is taking a third dose.       Review of Systems   Constitutional: Positive for chills, fatigue and fever.   HENT: Positive for sinus pressure, sinus pain and sore throat.    Eyes: Negative.    Respiratory: Negative.    Cardiovascular: Positive for leg swelling.   Gastrointestinal: Positive for abdominal pain, constipation and diarrhea.   Endocrine: Negative.    Genitourinary: Negative.    Musculoskeletal: Positive for arthralgias, back pain and myalgias.   Skin: Negative.    Allergic/Immunologic: Negative.    Neurological: Positive for dizziness.   Hematological: Negative. Psychiatric/Behavioral: Positive for sleep disturbance. The patient is nervous/anxious.    A complete review of systems was obtained from the patient and all other systems were reviewed and negative.    Objective:         ??? ALPRAZolam (XANAX) 0.25 mg tablet Take one tablet by mouth at bedtime as needed for Anxiety.   ??? AMITR/GABAPEN/EMU OIL 07/24/08% CREAM (COMPOUND) Apply small amount to affected area   ??? atorvastatin (LIPITOR) 40 mg tablet Take one tablet by mouth daily.   ??? blood sugar diagnostic (ONETOUCH VERIO) test strip Use 1 Strip as directed before meals and at bedtime.   ??? Blood-Glucose Sensor (DEXCOM G6 SENSOR) devi Use 1 each as directed as directed. Change every 10 days   ??? budesonide (ENTOCORT EC) 3 mg capsule TAKE 3 CAPSULES BY MOUTH IN THE MORNING (Patient taking differently: 3 mg every morning.)   ??? bumetanide (BUMEX) 1 mg tablet Take one tablet by mouth daily.   ??? buPROPion SR (WELLBUTRIN-SR) 150 mg tablet Take one tablet by mouth daily.   ??? cyclobenzaprine (FLEXERIL) 10 mg tablet Take 10 mg by mouth daily as needed. Marland Kitchen   ??? DEXCOM G6 RECEIVER misc Use 1 each as directed daily.   ??? DEXCOM G6 TRANSMITTER devi Use 1 each as directed as directed. Every 90 days   ??? diltiazem CD (CARDIZEM CD) 240 mg capsule Take 240 mg by mouth daily.   ??? duloxetine  DR (CYMBALTA) 60 mg capsule Take 60 mg by mouth daily with breakfast.   ??? HUMULIN R U-500 (CONC) KWIKPEN 500 unit/mL (3 mL) injection PEN Take 280 units before breakfast, 210 units before lunch, and 135 units before dinner. Take about 20 minutes prior to the meal   ??? insulin pen needles (disposable) (B-D NANO) 32 gauge x 5/32 pen needle Use one each as directed five times daily. Use with insulin injections.   ??? lancets (ONE TOUCH DELICA) MISC Use 1 Each as directed before meals and at bedtime. Diag: Diabetes mellitus (E.11)   ??? nebivolol (BYSTOLIC) 10 mg tablet Take one tablet by mouth daily. ??? pantoprazole DR (PROTONIX) 40 mg tablet Take one tablet by mouth twice daily. Indications: gastroesophageal reflux disease   ??? spironolactone (ALDACTONE) 50 mg tablet Take 50 mg by mouth daily.   ??? temazepam (RESTORIL) 15 mg capsule Take 15 mg by mouth at bedtime as needed.     Vitals:    11/24/18 1238   Weight: 101.2 kg (223 lb)   Height: 162.6 cm (64.02)   PainSc: Seven     Body mass index is 38.26 kg/m???.     Physical Exam  deferred due to telehealth       Assessment and Plan:  53 year old female with history of fistulizing ileocolonic Crohn's disease who is seen today in follow-up via telehealth.  She was last seen in GI clinic 10/01/2018.  At that time, it was decided to attempt to taper her off of her budesonide at the request of endocrinology due to difficult to control blood sugars. Upon tapering, she has since had subjective worsening of her abdominal symptoms in the form of increased frequency of bowel movements (6-7 daily compared to 3-4 daily) with increased cramping. Denies weight loss or blood in stools.     Fistulizing ileocolonic CD  - diagnosis in 1986 with a course that has involved numerous surgeries including initially an ileocecal resection, followed by a small-bowel resection and fistulectomy in 2014.???  -  Previously on humira, remicade ~2012  -July 2018 MRE without active small bowel inflammation  - 06/2017 with a few ulcers in the neoterminal ileum with biopsies showing ulcerated chronic active ileitis with acute cryptitis and glandular architectural distortion.  Negative for dysplasia and granulomas.  - Managed with 9 mg entocort  - During GI Clinic visit 09/2018, due to difficult to control blood sugars, she was attempted to taper off of her Entocort.  It appears she has had an increased symptoms since doing this.  ???7/22 EGD: Normal esophagus, Z line regular, Nissen fundoplication was found in the.  Not intact.  Small localized 2 mm antral erosions. ???11/12/2018 colonoscopy showed a large amount of solid stool obscuring 75% of the mucosa.  4 aphthous ulcers were present at the site of ileocolonic anastomosis, biopsy showing chronic active enteritis with acute cryptitis, crypt abscess, and glandular architectural distortion.  Negative for CMV dysplasia.    #Heartburn, waterbrash  - 10/2018 EGD without evidence of esophagitis  -Symptoms and not currently controlled on twice daily Protonix.    History of MAI Lung Infection  -Previously followed with pulmonary and infectious disease    At this time, it appears she is not tolerating the taper of entocort and has had a recurrence/worsening of symptoms. We will plan to restart her entocort for now at 9mg .  In light of possibly needing to be initiated on a biologic therapy for Crohn's disease, we will ask to be evaluated by pulmonary  to assess the safety of initiating an immunosuppressive agent (considering Entyvio) in the setting of mycobacterial pulmonary infection.  -Hepatitis panel, TB  - We will transition her PPI to Dexilant 60 mg daily and attempt to improve her GERD symptoms.     rtc 3 mo    Patient seen and discussed w/ Dr. Janene Madeira    Elmo Putt  GI Fellow  [phone]    ATTESTATION    I personally performed the key portions of this virtual E/M visit, discussed case with the resident, and concur with the resident documentation of history, exam, assessment and treatment plan.     We visited with Tina Snyder today via telehealth.  Unfortunately, as she has weaned down her Entocort dose her Crohn's symptoms have worsened.  Colonoscopy does show some ulcerations in her neoterminal ileum.  In order to completely stop the Entocort, we will have to use immunosuppressant biologic medications.  This is problematic due to the patient's history of mycobacterial lung infection.  The patient previously was on infliximab and Humira but these were stopped due to her MAI infection.  Thompson Grayer has a lower risk for systemic opportunistic infections, so might be a better option for her.  We will check TB serologies and hepatitis panel.  I will need the patient to go back and see pulmonology for clearance before starting Entyvio.  I would like their opinion about the risk of starting a biologic medication in Tina Snyder currently.  I have asked her to go back up to 9 mg daily of the Entocort for now until we have a more definitive plan in terms of starting a biologic.  Patient is also complaining of worsening GERD.  She did have a EGD recently which showed that her fundoplication wrap is loose.  We will try switching her to Dexilant 60 mg/day to see if this improves some of her reflux symptoms.    Staff name:  Prudy Feeler, MD Date:  11/25/2018              26 minutes spent on this patient's encounter with counseling and coordination of care taking >50% of the visit.

## 2018-11-24 NOTE — Patient Instructions
-   Will place referral for Dr. Charlie Pitter, Pulmonary  - Start dexilant 82m daily  - Labs have been ordered which we would need prior to starting biologic therapy for your crohn's

## 2018-11-25 DIAGNOSIS — K219 Gastro-esophageal reflux disease without esophagitis: Secondary | ICD-10-CM

## 2018-11-25 DIAGNOSIS — K50019 Crohn's disease of small intestine with unspecified complications: Secondary | ICD-10-CM

## 2018-11-27 ENCOUNTER — Encounter: Admit: 2018-11-27 | Discharge: 2018-11-27

## 2018-11-29 ENCOUNTER — Encounter: Admit: 2018-11-29 | Discharge: 2018-11-29

## 2018-11-29 DIAGNOSIS — F419 Anxiety disorder, unspecified: Secondary | ICD-10-CM

## 2018-11-30 ENCOUNTER — Encounter: Admit: 2018-11-30 | Discharge: 2018-11-30

## 2018-11-30 DIAGNOSIS — R609 Edema, unspecified: Secondary | ICD-10-CM

## 2018-11-30 MED ORDER — BUMETANIDE 2 MG PO TAB
ORAL_TABLET | Freq: Every day | 0 refills
Start: 2018-11-30 — End: ?

## 2018-11-30 MED ORDER — ALPRAZOLAM 0.25 MG PO TAB
.25 mg | ORAL_TABLET | Freq: Every evening | ORAL | 0 refills | Status: DC | PRN
Start: 2018-11-30 — End: 2019-01-13

## 2018-11-30 NOTE — Telephone Encounter
Pharmacy faxed refill request on alprazolam. It is Not a Standing order. Last filled #20 tablets x 0 refills on 11/03/2018. Routing to Dr. Patsey Berthold for Tina Barefoot, RN

## 2018-12-02 IMAGING — CR LOW_EXM
3 series · 3 of 3 positions shown · non-contrast
Comparison: none

[ankle ap]
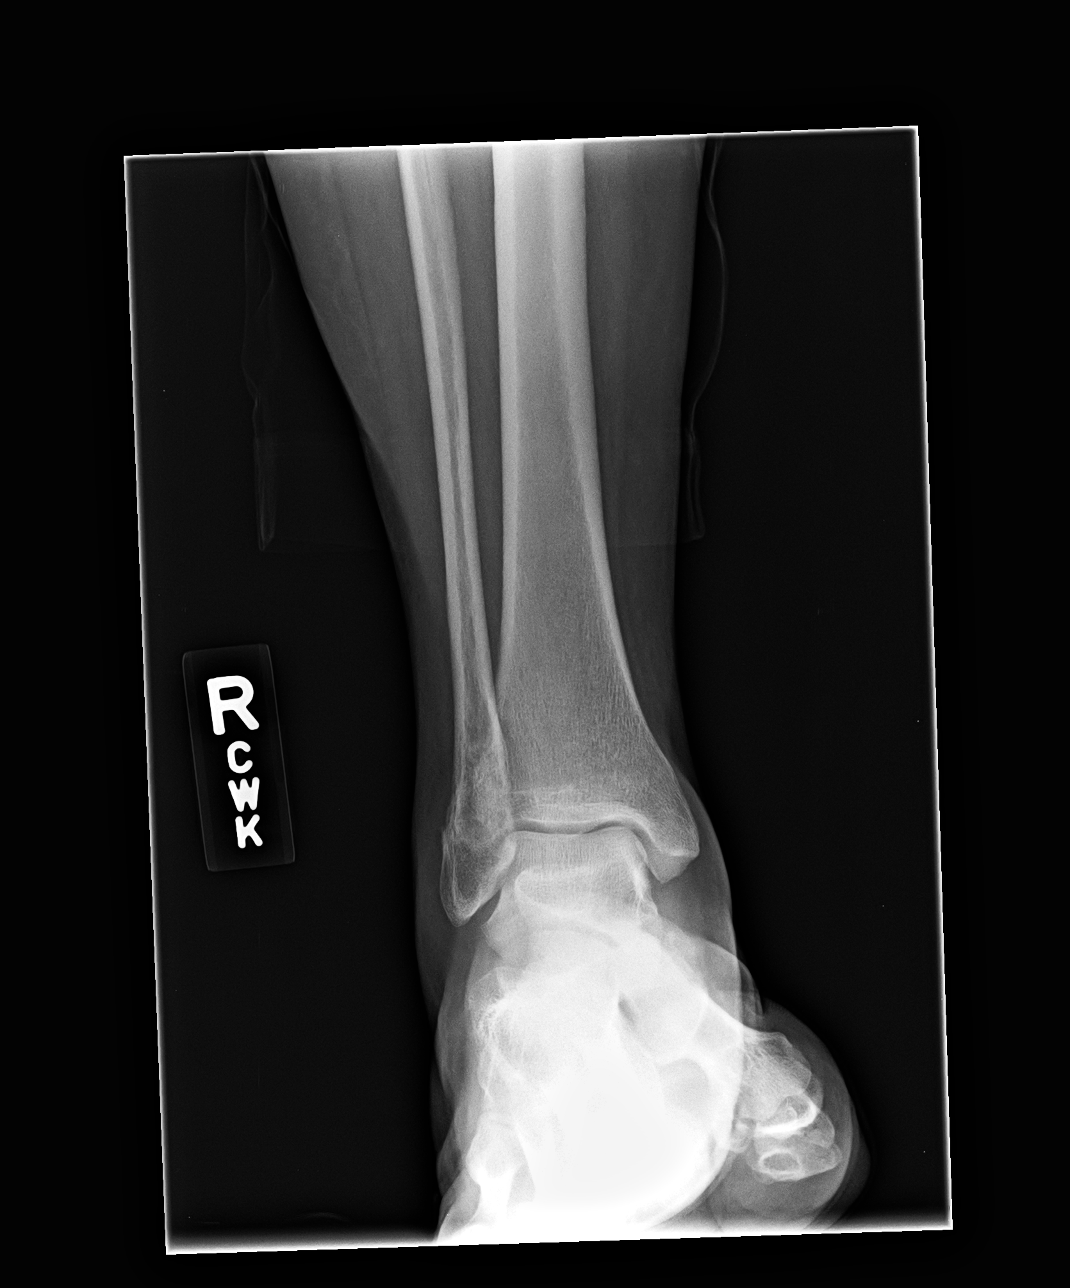

[ankle obl]
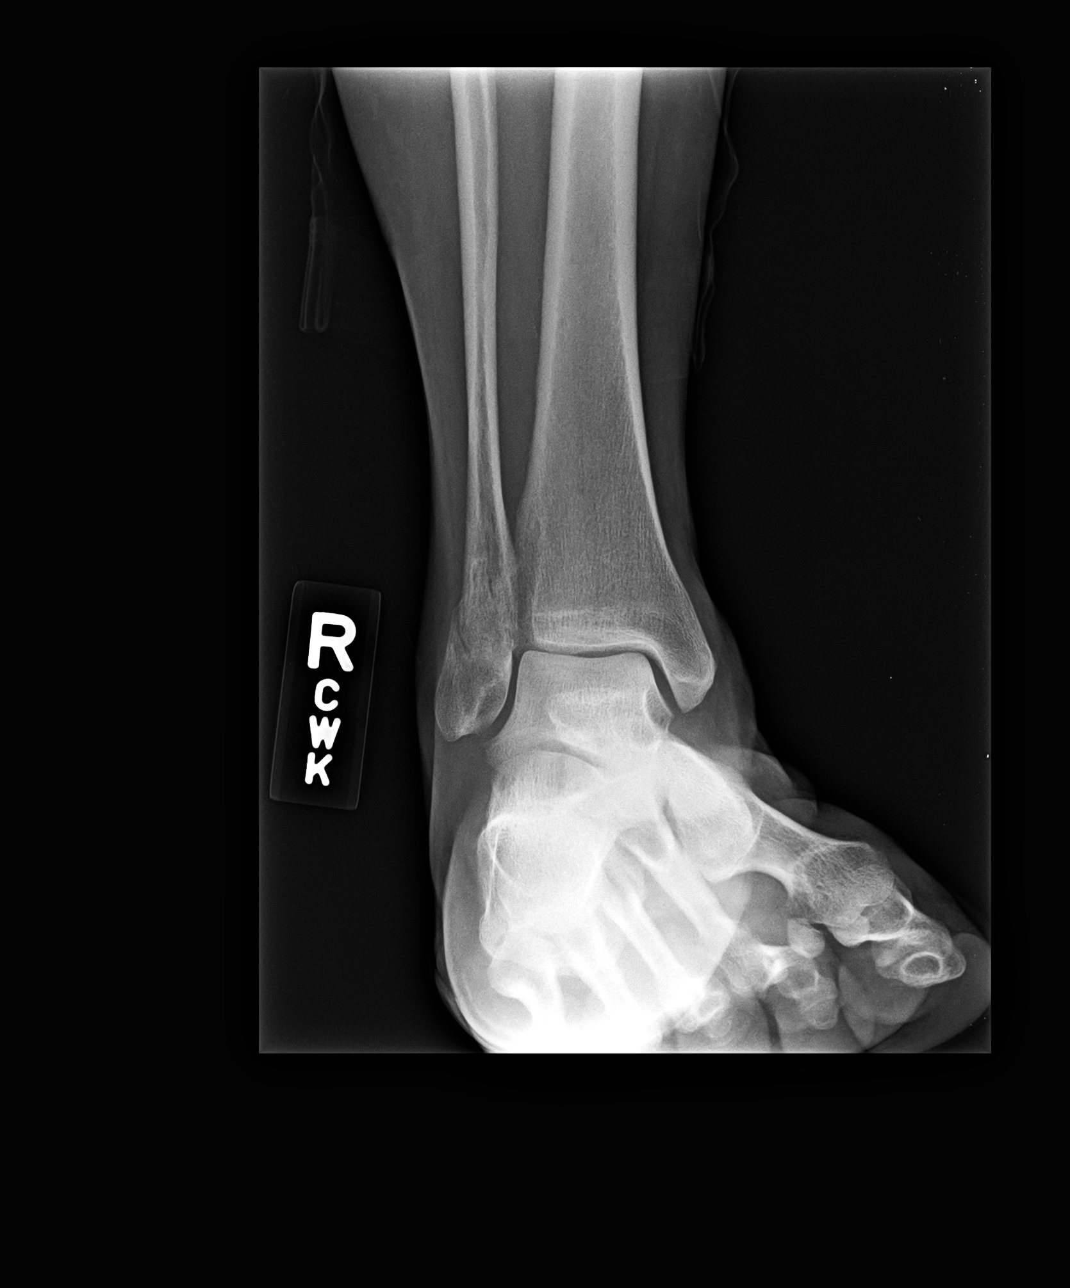

[ankle lat]
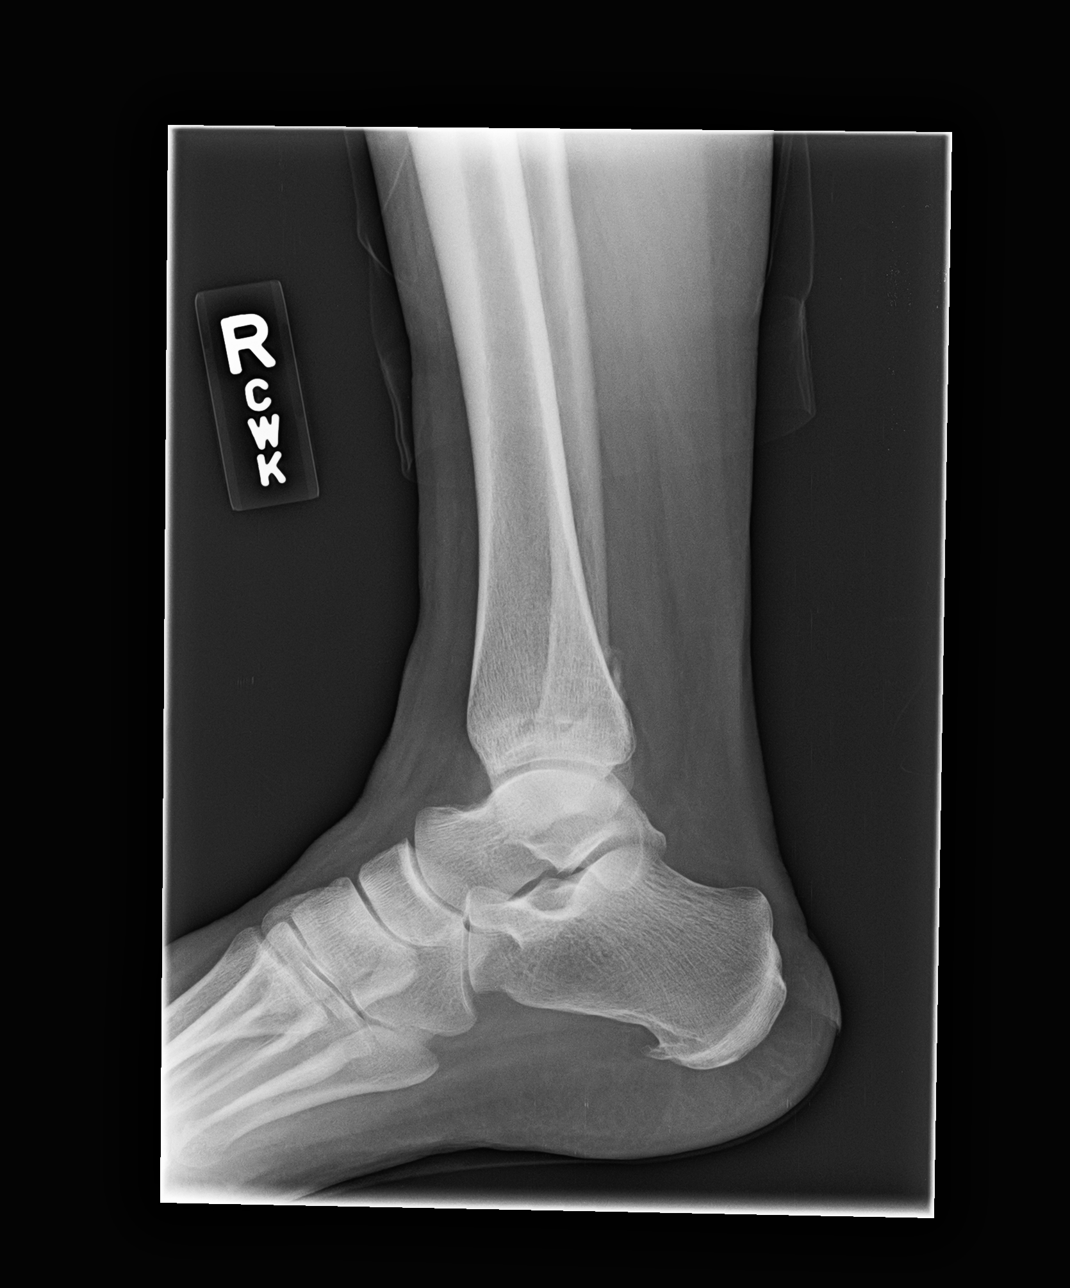

[3 of 3 positions shown; findings below may reference images not displayed]

EXAM

Right ankle.

INDICATION

follow-up RT. ankle fracture
F/U RT. ANKLE FX.

FINDINGS

The prior study was reviewed from 06/16/2017.

Three views of the right ankle were obtained.

There is a healing oblique fracture of the lateral malleolus. There is minimal lateral
displacement of the distal fragment. There has been no change in position of fragments since prior
study.

IMPRESSION

Healing oblique fracture of the distal right fibula with no change in position of fragments since
the prior study.

## 2018-12-03 ENCOUNTER — Encounter: Admit: 2018-12-03 | Discharge: 2018-12-03

## 2018-12-03 DIAGNOSIS — R609 Edema, unspecified: Secondary | ICD-10-CM

## 2018-12-03 MED ORDER — BUMETANIDE 1 MG PO TAB
1 mg | ORAL_TABLET | Freq: Every day | ORAL | 1 refills | Status: DC
Start: 2018-12-03 — End: 2019-06-11

## 2018-12-07 ENCOUNTER — Encounter: Admit: 2018-12-07 | Discharge: 2018-12-07

## 2018-12-07 ENCOUNTER — Ambulatory Visit: Admit: 2018-12-07 | Discharge: 2018-12-07 | Payer: BC Managed Care – PPO

## 2018-12-07 DIAGNOSIS — K5090 Crohn's disease, unspecified, without complications: Secondary | ICD-10-CM

## 2018-12-07 DIAGNOSIS — J479 Bronchiectasis, uncomplicated: Secondary | ICD-10-CM

## 2018-12-07 DIAGNOSIS — R911 Solitary pulmonary nodule: Secondary | ICD-10-CM

## 2018-12-07 DIAGNOSIS — G43909 Migraine, unspecified, not intractable, without status migrainosus: Secondary | ICD-10-CM

## 2018-12-07 DIAGNOSIS — D699 Hemorrhagic condition, unspecified: Secondary | ICD-10-CM

## 2018-12-07 DIAGNOSIS — K7581 Nonalcoholic steatohepatitis (NASH): Secondary | ICD-10-CM

## 2018-12-07 DIAGNOSIS — J302 Other seasonal allergic rhinitis: Secondary | ICD-10-CM

## 2018-12-07 DIAGNOSIS — G40909 Epilepsy, unspecified, not intractable, without status epilepticus: Secondary | ICD-10-CM

## 2018-12-07 DIAGNOSIS — K219 Gastro-esophageal reflux disease without esophagitis: Secondary | ICD-10-CM

## 2018-12-07 DIAGNOSIS — E119 Type 2 diabetes mellitus without complications: Secondary | ICD-10-CM

## 2018-12-07 DIAGNOSIS — I10 Essential (primary) hypertension: Secondary | ICD-10-CM

## 2018-12-07 DIAGNOSIS — K639 Disease of intestine, unspecified: Secondary | ICD-10-CM

## 2018-12-07 DIAGNOSIS — IMO0002 Ulcer: Secondary | ICD-10-CM

## 2018-12-07 DIAGNOSIS — I8290 Acute embolism and thrombosis of unspecified vein: Secondary | ICD-10-CM

## 2018-12-07 DIAGNOSIS — D4959 Neoplasm of unspecified behavior of other genitourinary organ: Secondary | ICD-10-CM

## 2018-12-07 DIAGNOSIS — A310 Pulmonary mycobacterial infection: Secondary | ICD-10-CM

## 2018-12-07 DIAGNOSIS — R42 Dizziness and giddiness: Secondary | ICD-10-CM

## 2018-12-07 DIAGNOSIS — M359 Systemic involvement of connective tissue, unspecified: Secondary | ICD-10-CM

## 2018-12-07 DIAGNOSIS — E139 Other specified diabetes mellitus without complications: Secondary | ICD-10-CM

## 2018-12-07 DIAGNOSIS — M797 Fibromyalgia: Secondary | ICD-10-CM

## 2018-12-07 NOTE — Progress Notes
Subjective:       History of Present Illness  Tina Snyder is a 53 y.o. female.  She presents my clinic today for follow-up of prior MAI infection.  She states that she has continued to have difficulty with her inflammatory bowel disease and may initiate therapy with Entyvio in the near future.  Concern was raised by her gastroenterologist, Dr. Janene Madeira, regarding her prior history of MAI infection, and what consequences therapy with Thompson Grayer may have in this regard.  She otherwise has been treated with budesonide as well as methotrexate in the past for Crohn's disease but unfortunately has had worsening disease.  She has concurrent diabetes which has limited treatment options somewhat in her case.  She denies any new respiratory symptoms.  Denies new cough or sputum production.  Denies fevers or chills.  Denies night sweats or unintentional weight loss.       Review of Systems   Constitutional: Positive for fatigue.   Gastrointestinal: Positive for abdominal pain.   All other systems reviewed and are negative.        Objective:         ??? ALPRAZolam (XANAX) 0.25 mg tablet TAKE ONE TABLET BY MOUTH AT BEDTIME AS NEEDED FOR ANXIETY.   ??? AMITR/GABAPEN/EMU OIL 07/24/08% CREAM (COMPOUND) Apply small amount to affected area   ??? atorvastatin (LIPITOR) 40 mg tablet Take one tablet by mouth daily.   ??? blood sugar diagnostic (ONETOUCH VERIO) test strip Use 1 Strip as directed before meals and at bedtime.   ??? Blood-Glucose Sensor (DEXCOM G6 SENSOR) devi Use 1 each as directed as directed. Change every 10 days   ??? budesonide (ENTOCORT EC) 3 mg capsule TAKE 3 CAPSULES BY MOUTH IN THE MORNING (Patient taking differently: 3 mg every morning.)   ??? bumetanide (BUMEX) 1 mg tablet Take one tablet by mouth daily.   ??? buPROPion SR (WELLBUTRIN-SR) 150 mg tablet Take one tablet by mouth daily.   ??? cyclobenzaprine (FLEXERIL) 10 mg tablet Take 10 mg by mouth daily as needed. Marland Kitchen   ??? DEXCOM G6 RECEIVER misc Use 1 each as directed daily. ??? DEXCOM G6 TRANSMITTER devi Use 1 each as directed as directed. Every 90 days   ??? dexlansoprazole (DEXILANT) 60 mg capsule Take one capsule by mouth daily. Indications: gastroesophageal reflux disease   ??? diltiazem CD (CARDIZEM CD) 240 mg capsule Take 240 mg by mouth daily.   ??? duloxetine DR (CYMBALTA) 60 mg capsule Take 60 mg by mouth daily with breakfast.   ??? HUMULIN R U-500 (CONC) KWIKPEN 500 unit/mL (3 mL) injection PEN Take 280 units before breakfast, 210 units before lunch, and 135 units before dinner. Take about 20 minutes prior to the meal   ??? insulin pen needles (disposable) (B-D NANO) 32 gauge x 5/32 pen needle Use one each as directed five times daily. Use with insulin injections.   ??? lancets (ONE TOUCH DELICA) MISC Use 1 Each as directed before meals and at bedtime. Diag: Diabetes mellitus (E.11)   ??? nebivolol (BYSTOLIC) 10 mg tablet Take one tablet by mouth daily.   ??? pantoprazole DR (PROTONIX) 40 mg tablet Take one tablet by mouth twice daily. Indications: gastroesophageal reflux disease   ??? spironolactone (ALDACTONE) 50 mg tablet Take 50 mg by mouth daily.   ??? temazepam (RESTORIL) 15 mg capsule Take 15 mg by mouth at bedtime as needed.     Vitals:    12/07/18 1536   Weight: 101.2 kg (223 lb)   Height:  162.6 cm (64.02)   PainSc: Six     Body mass index is 38.26 kg/m???.     Physical Exam         Assessment and Plan:  Impression:  1.  Prior history of MAI infection of the lung.  This appears to be quiesced sent today.  She has not been treated since 2016 during which time she received 18 months of 4 drug therapy under the direction of Dr. Colon Flattery.  2.  Chronic bronchiectasis.  She had been taking Monday Wednesday Friday azithromycin for management but has not required any medical intervention for management of bronchiectasis for quite some time.  3.  GE reflux, under usual state of control using PPI.  4.  Crohn's disease, managed by Dr. Janene Madeira.  May consider therapy with Entyvio in the near future.  5.  Chronic cough, significantly improved today over baseline.  6.  OSA, on therapy with CPAP per Dr. Evonnie Pat.    Plan:  1.  At this time, feel it is reasonable to proceed forward with therapy with Entyvio.  She has been in remission from an MAI standpoint and was adequately treated 18 weeks of a 4 drug regimen in 2016 without any evidence of recurrence since.  She will need to be closely monitored during therapy.  I will plan to see her at six-month intervals during therapy with Entyvio to ensure that she does not suffer a relapse of MAI from a pulmonary standpoint.  2.  Obtain noncontrast chest CT today as a baseline.  3.  Will reach out to Dr. Janene Madeira regarding initiating therapy with Entyvio.  4.  Will reach out to Dr. Colon Flattery regarding initiating therapy with Entyvio from an infectious standpoint.    Follow-up in 6 months via telehealth.  Interval follow-up as necessary, dictated by symptoms.

## 2018-12-07 NOTE — Patient Instructions
Clinic Visit Summary:     My nurse is Lakoda Mcanany.  You can reach her at [phone]    Please contact the Pulmonary Nurse Coordinator with signs and symptoms of worsening productive cough with thick secretions, blood in sputum, chest tightness/pain, shortness of breath, fever, chills, night sweats, or any questions or concerns.     Pulmonary RN Coordinator - Venancio Chenier, RN at [phone] or fax [phone]     For refills on medications, please have your pharmacy fax a refill authorization request form to our office at (Fax) [phone]. Please allow at least 3 business days for refill requests.     For urgent issues after business hours/weekends/holidays call [phone] and request for the pulmonary fellow to be paged.

## 2018-12-08 ENCOUNTER — Encounter: Admit: 2018-12-08 | Discharge: 2018-12-08

## 2018-12-08 DIAGNOSIS — R911 Solitary pulmonary nodule: Secondary | ICD-10-CM

## 2018-12-08 DIAGNOSIS — R05 Cough: Secondary | ICD-10-CM

## 2018-12-08 NOTE — Telephone Encounter
Ct chest w/o contrast order entered.  Spoke with patient and provided number for CT scheduling.  She voiced understanding that she should call and schedule appt and CT needed to be done ASAP.  Trellis Moment, RN

## 2018-12-09 ENCOUNTER — Encounter: Admit: 2018-12-09 | Discharge: 2018-12-09

## 2018-12-09 ENCOUNTER — Ambulatory Visit: Admit: 2018-12-09 | Discharge: 2018-12-09

## 2018-12-09 DIAGNOSIS — E119 Type 2 diabetes mellitus without complications: Secondary | ICD-10-CM

## 2018-12-10 ENCOUNTER — Encounter: Admit: 2018-12-10 | Discharge: 2018-12-10

## 2018-12-10 ENCOUNTER — Ambulatory Visit: Admit: 2018-12-10 | Discharge: 2018-12-10

## 2018-12-10 DIAGNOSIS — K5090 Crohn's disease, unspecified, without complications: Secondary | ICD-10-CM

## 2018-12-10 DIAGNOSIS — K7581 Nonalcoholic steatohepatitis (NASH): Secondary | ICD-10-CM

## 2018-12-10 DIAGNOSIS — I8290 Acute embolism and thrombosis of unspecified vein: Secondary | ICD-10-CM

## 2018-12-10 DIAGNOSIS — I10 Essential (primary) hypertension: Secondary | ICD-10-CM

## 2018-12-10 DIAGNOSIS — K639 Disease of intestine, unspecified: Secondary | ICD-10-CM

## 2018-12-10 DIAGNOSIS — E119 Type 2 diabetes mellitus without complications: Secondary | ICD-10-CM

## 2018-12-10 DIAGNOSIS — IMO0002 Ulcer: Secondary | ICD-10-CM

## 2018-12-10 DIAGNOSIS — K50019 Crohn's disease of small intestine with unspecified complications: Secondary | ICD-10-CM

## 2018-12-10 DIAGNOSIS — E139 Other specified diabetes mellitus without complications: Secondary | ICD-10-CM

## 2018-12-10 DIAGNOSIS — D4959 Neoplasm of unspecified behavior of other genitourinary organ: Secondary | ICD-10-CM

## 2018-12-10 DIAGNOSIS — J302 Other seasonal allergic rhinitis: Secondary | ICD-10-CM

## 2018-12-10 DIAGNOSIS — M359 Systemic involvement of connective tissue, unspecified: Secondary | ICD-10-CM

## 2018-12-10 DIAGNOSIS — G43909 Migraine, unspecified, not intractable, without status migrainosus: Secondary | ICD-10-CM

## 2018-12-10 DIAGNOSIS — G40909 Epilepsy, unspecified, not intractable, without status epilepticus: Secondary | ICD-10-CM

## 2018-12-10 DIAGNOSIS — K219 Gastro-esophageal reflux disease without esophagitis: Secondary | ICD-10-CM

## 2018-12-10 DIAGNOSIS — M797 Fibromyalgia: Secondary | ICD-10-CM

## 2018-12-10 DIAGNOSIS — D699 Hemorrhagic condition, unspecified: Secondary | ICD-10-CM

## 2018-12-10 DIAGNOSIS — R42 Dizziness and giddiness: Secondary | ICD-10-CM

## 2018-12-10 DIAGNOSIS — H01001 Unspecified blepharitis right upper eyelid: Principal | ICD-10-CM

## 2018-12-10 MED ORDER — ERYTHROMYCIN 5 MG/GRAM (0.5 %) OP OINT
.5 [in_us] | Freq: Two times a day (BID) | OPHTHALMIC | 1 refills | 18.50000 days | Status: DC
Start: 2018-12-10 — End: 2019-03-17

## 2018-12-10 NOTE — Patient Instructions
Blepharitis    Blepharitis is an inflammation of the eyelid. It results in swelling of the eyelids, and it is often caused by a bacterial infection or a skin condition. Blepharitis is a common eye condition. There are two types. Anterior blepharitis occurs where the eyelashes are attached (outside front edge of the eyelid). Posterior blepharitis affects the inner edge of the eyelid that touches the eyeball.  In addition to swollen eyelids, blepharitis symptoms can include thick, yellow, dandruff-like scales that stick to the eyelid. There may be oily patches on the eyelid. The eyelashes may be crusted (with dandruff-like scales) when you wake up from sleeping. The irritated area may itch. The eyelids may be red. The eyes can be red and burn or sting. The eyes may tear a lot, or be dry. You can become sensitive to light or have blurred vision. Symptoms of blepharitis can cause irritability.  Blepharitis is a chronic condition and hard to cure. Even with successful treatment, recurrences are common. Good hygiene and home treatments (in the Home care section below) can improve your condition.  Causes  Causes of blepharitis may include:   Problems with the oil glands in the eyelid (meibomian glands)   Dandruff of the scalp and eyebrows (seborrheic dermatitis)   Acne rosacea (a skin condition that causes redness of the face, and other symptoms)   Eyelash mites (tiny organisms in the eyelash follicles)   Allergic reactions to cosmetics or medicines  Home care  Medicine: The healthcare provider may prescribe an antibiotic eye drops or ointment, artificial tears, and/or steroid eye drops. Follow all instructions for using these medicines. Use all medicines as directed. If you have pain, take medicine as advised by the healthcare provider.   Wash your hands carefully with soap and warm water before and after caring for your eyes.   Apply a warm compress or a warm, moist washcloth to the eyelids for 1 minute, 2 to 3  times a day, to loosen the crust. Then, wipe away scales or crust from the eyelids.   After applying the warm compress, gently scrub the base of the eyelashes for almost 15 seconds per eyelid. To do this, close your eyes and use a moist eyelid cleansing wipe, clean washcloth, or cotton swab. Ask your healthcare provider about products (such as gentle baby shampoo) to use to help clean the eyelids.   You may be instructed to gently massage your eyelids to help unblock the eyelid glands. Follow all instructions given by the healthcare provider.   Unless told otherwise, on a regular basis, with eyes closed, clean your eyelids as directed by the healthcare provider. Blepharitis can be an ongoing problem.   Don't wear eye makeup until the inflammation goes away, or as directed by your healthcare provider.   Unless told otherwise, stop using contact lenses until you complete treatment for the condition.   Wash your hands regularly to help prevent dirt and bacteria from coming in contact with your eyelid.  Follow-up care  Follow up with your healthcare provider, or as advised. Your healthcare provider may refer you to an eye specialist (an optometrist or ophthalmologist) for further evaluation and treatment.  When to seek medical advice  Call your healthcare provider right away if any of these occur:   Increase in redness of the white part of the eye   Increase in swelling, redness, irritation, or pain of the eyelids   Eye pain   Change in vision (trouble seeing or blurring)     Drainage (pus, blood) from the eyelid   Fever of 100.4F (38C) or higher, or as directed by your healthcare provider  [employer] last reviewed this educational content on 06/20/2016   2000-2020 [employer] [address], [place] [address]. All rights reserved. This information is not intended as a substitute for professional medical care. Always follow your healthcare professional's instructions.

## 2018-12-10 NOTE — Progress Notes
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, Steroid Induced  Date of Diagnosis: 2012    HgA1c HISTORY:  Lab Results   Component Value Date    HGBA1C 9.1 (H) 11/16/2018    HGBA1C 9.3 (H) 10/24/2015    HGBA1C 9.2 (H) 10/22/2015      Lab Results   Component Value Date    A1C 9.2 03/27/2018    A1C 7.6 11/03/2017    A1C 7.8 06/20/2017       LIPID PROFILE:  Lab Results   Component Value Date    CHOL 135 10/30/2018    HDL 35 (L) 10/30/2018    LDL 75 10/30/2018    VLDL 54 10/30/2018    TRIG 161 (H) 10/30/2018       WEIGHT HISTORY:  Wt Readings from Last 3 Encounters:   12/07/18 101.2 kg (223 lb)   11/24/18 101.2 kg (223 lb)   11/16/18 104.8 kg (231 lb)     BMI Readings from Last 1 Encounters:   12/07/18 38.26 kg/m???       Medication Comment: u500 insulin, 280/210/135   Pt. Missing Doses of Diabetes Meds: No   Past Diabetes Treatment: glyburide, Tresiba 290u QD, Novolog 18/24/30 + 8:40/140    Injection/Infusion Set Locations: Abdomen    Rotation Injection/Infusion Set Locations: rotates around limited sites in lower abdomen    Injection/Infusion Administration: Disposable pens    Insulin Pump: No - asks about Omnipod, which friend has                 BG Monitoring Frequency: personal CGM   Source: Per Patient Verbal Report   Range per Meter: N/A   Continuous Glucose Monitor? Dexcom    Frequency Worn: 93%    Continuous Glucose Monitoring Analysis and Interpretation    Name/Type of Device Placed: Dexcom G5/G6    Date of Print out: 12/09/18 Continuous glucose monitor downloaded and personally reviewed by me on 12/09/18.    Analysis of Data    Date Range: 11/26/18 - 12/09/18    Sensor usage time (%): 93%    Average glucose (mg/dL): 096    Coefficient of Variation (%): 36    Standard Deviation (mg/dL): 85    Time in range (%): 30    Time above range (%): 70   Time above 250mg /dL (%): 29    Time below range (%): 0   Time below 54mg /dL (%): 0    Assessment  Trends/Patterns observed  ? Hyperglycemia: trends high after lunch, persists overnight  ? Hypoglycemia: none; trends down significantly overnight, does not lead to hypoglycemia    Plan  Treatment changes: pt increased evening dose from 135 --> 150. Recommend the following changes: 280/210/135 --> 280/230/140      Hypoglycemia  Hypoglycemia frequency and severity (<70 mg/dL): none   Hypoglycemia Unawareness? No   At what BG level pt experiences symptoms? 75-80    Treatment of hypoglycemia: eat something - snack: fruits/vegetables OR chips & salsa   Hypoglycemia comment: risk of fasting hypoglycemia when evening/HS BGs improve. Pt increase to 150  u (from 135) likely too aggressive    Hyperglycemia  Using CF for hyperglycemia? Yes    ISF: not using while taking u500 insulin   Hyperglycemia comment: budesonide daily; regular soda ~1x/week, occasional snacking on chips and no bolus    Typical diet:    Breakfast AM Snacks Lunch PM Snacks Dinner Evening   May skip  Ham & beans  Spaghetti      Diet Comments: Usually two meals per day, may skip breakfast or lunch (or have small breakfast such as toast), may snack around 3:00 pm when she gets hungry. Regular soda about once per week. Some modifications during Crohn's flare up but may continue raw vegetables and other grains. Avoids artificial sweeteners. Has stopped drinking grapefruit juice with morning medications.  Timing of Bolus: Before Meal  Bolus Comment: states some boluses cause her to bruise or bleed  Does pt count carbohydrates?: No Insulin to carb ratio? No      Type, Frequency, Duration of Exercise: walks 3-4 times per day for 5-10 minutes    Barriers to Treatment: Educational, Health, Knowledge and Physical Limitations    Specific Areas of Concern Today: 53 y/o female with peripheral edema, CKD stage 2-3, Crohn's, OSA on CPAP, HTN, T2DM. Planned to taper off of budesonide on 8/12. Recommended by GI to resume this at 3 capsules per day. Pt reports taking one capsule per day, rather than 3, because she has noticed improvements in her BG after reducing budesonide. Received clearance from pulmonary team to initiate Entyvio for Crohn's. Awaiting baseline CT and clearance from GI and ID to initiate therapy.    Intervention  Learners Present: Patient, Significant Other and Son/Daughter  Status of Diabetes Education Program? Never enrolled     Handouts Given: Sick Days    Education provided:  Disease Process: Treatment Options 1  Carbohydrate Counting: Avoid sugary drinks, Estimating portion sizes, Fiber and Main source of carbohydrate 1  Healthy Eating: Sources of fiber, Increase fruits/vegetables and Including protein 3  Physical Activity: Effect on BG 2  Medications: Calculating insulin doses, Importance of compliance, Proper administration, Site selection/rotation and Treat-to-Target 2  Blood Glucose Monitoring: Pattern Management, Personal targets and When to contact HCP 2  Acute Complications: Hyperglycemia: Causes and prevention, Correction via injection, Drink Fluids and Using ISF 3  Acute Complications: Hypoglycemia: Causes and prevention, Fast-acting carbohydrates, Rule of 15 and Signs and symptoms 2  Sick Day Management: Effect of illness on BG, Adjustment in eating/drinking, Adjustment of medication, Fluid intake sugar-free vs sugary and When to contact HCP 2  Insulin Pumps and Continuous Glucose Monitoring: Potential risks/benefits 1  Chronic Complications: Development and prevention 2 Psychosocial Adjustment: Effect of stress on BG and Problem solving 2  Preconception Care: NA  Developing strategies to promote health & behavior change: 3    Pre-session assessment shown in parentheses following content area assessed  1=needs improvement   2=needs review   3=comprehends key points   4=demonstrates understanding   NC=not covered   N/A=not applicable    There are no Patient Instructions on file for this visit.    Diabetes Management Support Plan: Attend PCP and Diabetes Provider appointments regularly and Return for f/u with educator    Diabetes Program Outcome Monitoring and Evaluation: A1C       Program Outcome at Baseline: 6.8   Program Outcome at Follow up: 9.1    Did Diabetes Program Outcome Improve?: No       Patient-Selected Behavior Change Objective Area: Taking Medications  Patient-Selected Behavior Change Goal: use new injection sites     Patient-Selected Behavior Change Goal Baseline % Attainment: 0%    Start Time: 2:30  Stop Time: 3:00    Monitoring and Evaluation    Follow-up: Return in about 2 months (around 02/08/2019).

## 2018-12-15 ENCOUNTER — Encounter: Admit: 2018-12-15 | Discharge: 2018-12-15

## 2018-12-15 MED ORDER — VEDOLIZUMAB IVPB
300 mg | Freq: Once | INTRAVENOUS | 0 refills | Status: CN
Start: 2018-12-15 — End: ?

## 2018-12-15 MED ORDER — ACETAMINOPHEN 500 MG PO TAB
500 mg | Freq: Once | ORAL | 0 refills | Status: CN
Start: 2018-12-15 — End: ?

## 2018-12-15 MED ORDER — DIPHENHYDRAMINE HCL 25 MG PO CAP
25 mg | Freq: Once | ORAL | 0 refills | Status: CN
Start: 2018-12-15 — End: ?

## 2018-12-15 NOTE — Telephone Encounter
Entyio induction and maint infusion therapy plan entered, routed to Dr. York Cerise for co-signature.

## 2018-12-15 NOTE — Telephone Encounter
Yes, ID and Pulm on board and ok with Entyvio   Labs checked   Please start process of Entyvio approval    Previous Messages     ----- Message -----   From: Margaretha Glassing, RN   Sent: 12/15/2018  8:41 AM CDT   To: Viviann Spare, MD   Subject: Melton AlarWeyman Rodney                      Dr. Buckles, Unable to see PA has been started for [facility]. Are you OK with moving forward with Entyvio induction dosing and infusions every 8 weeks? I will enter infusion plan to get PA started if OK. Thanks! Jeani Hawking

## 2018-12-15 NOTE — Telephone Encounter
Patient phoned and aware of next steps of working through [facility] and to contact our office within 1 week if has not been notified regarding final insurance determination.  Patient has our direct phone number.

## 2018-12-16 ENCOUNTER — Ambulatory Visit: Admit: 2018-12-16 | Discharge: 2018-12-16

## 2018-12-16 ENCOUNTER — Encounter: Admit: 2018-12-16 | Discharge: 2018-12-16

## 2018-12-16 DIAGNOSIS — R05 Cough: Secondary | ICD-10-CM

## 2018-12-16 DIAGNOSIS — R911 Solitary pulmonary nodule: Principal | ICD-10-CM

## 2018-12-17 MED ORDER — BUMETANIDE 2 MG PO TAB
ORAL_TABLET | Freq: Every day | 0 refills
Start: 2018-12-17 — End: ?

## 2018-12-23 ENCOUNTER — Encounter: Admit: 2018-12-23 | Discharge: 2018-12-23

## 2018-12-23 NOTE — Telephone Encounter
Spoke with patient and went over CT results again.  Discussed the Covid was just one possibility for the CT results but since she wasn't having a fever and she had been tested three times, with a negative outcome, that per Dr Charlie Pitter, there wasn't much else to do.  She voiced understanding and thanked me for the call.  Trellis Moment, RN

## 2018-12-24 ENCOUNTER — Encounter: Admit: 2018-12-24 | Discharge: 2018-12-24

## 2018-12-24 DIAGNOSIS — Z1231 Encounter for screening mammogram for malignant neoplasm of breast: Secondary | ICD-10-CM

## 2018-12-24 MED ORDER — HYDROCODONE-ACETAMINOPHEN 5-325 MG PO TAB
1 | ORAL_TABLET | ORAL | 0 refills | 30.00000 days | Status: DC | PRN
Start: 2018-12-24 — End: 2019-06-02

## 2018-12-29 ENCOUNTER — Encounter: Admit: 2018-12-29 | Discharge: 2018-12-29

## 2018-12-29 ENCOUNTER — Emergency Department: Admit: 2018-12-29 | Discharge: 2018-12-30 | Disposition: A | Discharge status: Home / Self Care

## 2018-12-29 ENCOUNTER — Emergency Department: Admit: 2018-12-29 | Discharge: 2018-12-29

## 2018-12-29 DIAGNOSIS — D4959 Neoplasm of unspecified behavior of other genitourinary organ: Secondary | ICD-10-CM

## 2018-12-29 DIAGNOSIS — E119 Type 2 diabetes mellitus without complications: Secondary | ICD-10-CM

## 2018-12-29 DIAGNOSIS — M797 Fibromyalgia: Secondary | ICD-10-CM

## 2018-12-29 DIAGNOSIS — G40909 Epilepsy, unspecified, not intractable, without status epilepticus: Secondary | ICD-10-CM

## 2018-12-29 DIAGNOSIS — K219 Gastro-esophageal reflux disease without esophagitis: Secondary | ICD-10-CM

## 2018-12-29 DIAGNOSIS — R1013 Epigastric pain: Principal | ICD-10-CM

## 2018-12-29 DIAGNOSIS — I10 Essential (primary) hypertension: Secondary | ICD-10-CM

## 2018-12-29 DIAGNOSIS — J302 Other seasonal allergic rhinitis: Secondary | ICD-10-CM

## 2018-12-29 DIAGNOSIS — R42 Dizziness and giddiness: Secondary | ICD-10-CM

## 2018-12-29 DIAGNOSIS — M359 Systemic involvement of connective tissue, unspecified: Secondary | ICD-10-CM

## 2018-12-29 DIAGNOSIS — IMO0002 Ulcer: Secondary | ICD-10-CM

## 2018-12-29 DIAGNOSIS — K5090 Crohn's disease, unspecified, without complications: Secondary | ICD-10-CM

## 2018-12-29 DIAGNOSIS — D72829 Elevated white blood cell count, unspecified: Secondary | ICD-10-CM

## 2018-12-29 DIAGNOSIS — E139 Other specified diabetes mellitus without complications: Secondary | ICD-10-CM

## 2018-12-29 DIAGNOSIS — D699 Hemorrhagic condition, unspecified: Secondary | ICD-10-CM

## 2018-12-29 DIAGNOSIS — K639 Disease of intestine, unspecified: Secondary | ICD-10-CM

## 2018-12-29 DIAGNOSIS — K7581 Nonalcoholic steatohepatitis (NASH): Secondary | ICD-10-CM

## 2018-12-29 DIAGNOSIS — G43909 Migraine, unspecified, not intractable, without status migrainosus: Secondary | ICD-10-CM

## 2018-12-29 DIAGNOSIS — I8290 Acute embolism and thrombosis of unspecified vein: Secondary | ICD-10-CM

## 2018-12-29 LAB — URINALYSIS DIPSTICK REFLEX TO CULTURE
Lab: NEGATIVE
Lab: NEGATIVE
Lab: NEGATIVE
Lab: NEGATIVE

## 2018-12-29 LAB — URINALYSIS MICROSCOPIC REFLEX TO CULTURE

## 2018-12-29 LAB — POC LACTATE: Lab: 1 MMOL/L (ref 0.5–2.0)

## 2018-12-29 LAB — LIPASE: Lab: 26 U/L (ref >60)

## 2018-12-29 LAB — COMPREHENSIVE METABOLIC PANEL: Lab: 139 MMOL/L (ref >60)

## 2018-12-29 LAB — MAGNESIUM: Lab: 1.8 mg/dL (ref 1.6–2.6)

## 2018-12-29 LAB — TROPONIN-I: Lab: 0 ng/mL (ref 0.0–0.05)

## 2018-12-29 MED ORDER — DICYCLOMINE 20 MG PO TAB
20 mg | ORAL_TABLET | ORAL | 0 refills | Status: DC
Start: 2018-12-29 — End: 2019-03-09

## 2018-12-29 MED ORDER — SODIUM CHLORIDE 0.9 % IJ SOLN
50 mL | Freq: Once | INTRAVENOUS | 0 refills | Status: CP
Start: 2018-12-29 — End: ?
  Administered 2018-12-30: 50 mL via INTRAVENOUS

## 2018-12-29 MED ORDER — FENTANYL CITRATE (PF) 50 MCG/ML IJ SOLN
50 ug | Freq: Once | INTRAVENOUS | 0 refills | Status: CP
Start: 2018-12-29 — End: ?
  Administered 2018-12-29: 23:00:00 50 ug via INTRAVENOUS

## 2018-12-29 MED ORDER — IOHEXOL 350 MG IODINE/ML IV SOLN
100 mL | Freq: Once | INTRAVENOUS | 0 refills | Status: CP
Start: 2018-12-29 — End: ?
  Administered 2018-12-30: 100 mL via INTRAVENOUS

## 2018-12-29 MED ORDER — POLYETHYLENE GLYCOL 3350 17 GRAM/DOSE PO POWD
17 g | Freq: Every day | ORAL | 0 refills | 30.00000 days | Status: AC
Start: 2018-12-29 — End: ?

## 2018-12-29 MED ORDER — SODIUM PHOSPHATES 19-7 GRAM/118 ML RE ENEM
1 | ENEMA | Freq: Every day | RECTAL | 0 refills | Status: DC | PRN
Start: 2018-12-29 — End: 2019-03-17

## 2018-12-29 NOTE — Telephone Encounter
Patient was phoned and confirms having her typical right abdominal pain but now crosses over to the left and mainly in the middle, causing her to "double over" and rates "10" and then will go down to constant "6".  Did not have this severity of abdominal pain when seen in August.  No fevers, no vomiting with some nausea.  Clear diet consiting of water.  Did have some [place] last evening. Confirms taking budesonide 40m as prescribed.  Patient asking if she has a bowel blockage due to only mucous and blood.  Is taking Norco for pain.  Patient feels as if she needs to seek care today asking if should report to Urgent Care or ED.  Patient aware did get Entyvio scheduled for today but now since talking with patient will need to update Dr. BAlan Ripper HAline Brochure

## 2018-12-29 NOTE — Telephone Encounter
Patient aware of Dr. Buckles recommendation of ED now.  Infusion Center aware to cancel 1st Entyvio Infusion scheduled for today and will need to be rescheduled once d/c from ED or Hospital.

## 2018-12-29 NOTE — Telephone Encounter
Infusion Center confirms patient has been approved for Entyvio.  1pm today for 1stEntvio infusion.  [address], [place]  [address] 15 mins early, use google maps.

## 2018-12-29 NOTE — Telephone Encounter
Patient message mucous and blood since late Friday/Early Saturday asking about next steps if should be seen in Urgent Care or Office.  Updates is approved for Entyvio Infusion.

## 2018-12-29 NOTE — ED Notes
Pt to CT via wheelchair.

## 2018-12-30 ENCOUNTER — Encounter: Admit: 2018-12-30 | Discharge: 2018-12-30

## 2018-12-30 DIAGNOSIS — K50919 Crohn's disease, unspecified, with unspecified complications: Secondary | ICD-10-CM

## 2018-12-30 DIAGNOSIS — K5900 Constipation, unspecified: Secondary | ICD-10-CM

## 2018-12-30 DIAGNOSIS — K5080 Crohn's disease of both small and large intestine without complications: Secondary | ICD-10-CM

## 2018-12-30 DIAGNOSIS — D72829 Elevated white blood cell count, unspecified: Secondary | ICD-10-CM

## 2018-12-30 DIAGNOSIS — R9431 Abnormal electrocardiogram [ECG] [EKG]: Secondary | ICD-10-CM

## 2018-12-30 DIAGNOSIS — R1013 Epigastric pain: Principal | ICD-10-CM

## 2018-12-30 LAB — CBC AND DIFF
Lab: 0 10*3/uL (ref 0–0.20)
Lab: 0.2 10*3/uL (ref 0–0.45)
Lab: 1 % — ABNORMAL LOW (ref >60)
Lab: 11 10*3/uL — ABNORMAL HIGH (ref 1.8–7.0)
Lab: 14 % — ABNORMAL LOW (ref 24–44)
Lab: 15 10*3/uL — ABNORMAL HIGH (ref 4.5–11.0)
Lab: 2 % — ABNORMAL LOW (ref >60)
Lab: 2.1 10*3/uL (ref 1.0–4.8)
Lab: 20 % — ABNORMAL HIGH (ref 11–15)
Lab: 23 pg — ABNORMAL LOW (ref 26–34)
Lab: 31 % — ABNORMAL LOW (ref 36–45)
Lab: 31 g/dL — ABNORMAL LOW (ref 32.0–36.0)
Lab: 324 10*3/uL — ABNORMAL HIGH (ref 150–400)
Lab: 4.2 M/UL — ABNORMAL LOW (ref 4.0–5.0)
Lab: 73 FL — ABNORMAL LOW (ref 80–100)
Lab: 75 % (ref 41–77)
Lab: 8 % (ref 4–12)
Lab: 8.6 FL (ref 7–11)
Lab: 9.8 g/dL — ABNORMAL LOW (ref 12.0–15.0)

## 2018-12-30 MED ORDER — TRAMADOL 50 MG PO TAB
50 mg | ORAL_TABLET | ORAL | 0 refills | Status: DC | PRN
Start: 2018-12-30 — End: 2019-01-13

## 2018-12-30 NOTE — ED Notes
Pt appropriate for d/c at this time; home/self-care. AVS given; IV removed. No questions at this time. Pt ambulates out of department independently.

## 2018-12-31 ENCOUNTER — Encounter: Admit: 2018-12-31 | Discharge: 2018-12-31

## 2018-12-31 LAB — CULTURE-URINE W/SENSITIVITY: Lab: <10

## 2018-12-31 NOTE — Telephone Encounter
Patient was phoned and aware of Dr. Buckles sending in Ultram prescription last evening.  Patient confirms has call into Infusion Clinic to schedule [facility] asap.  Is taking Miralax.  Asked for patient to call for any concerns.

## 2018-12-31 NOTE — Telephone Encounter
Buckles, Darnelle Maffucci, MD  Margaretha Glassing, RN    Caller: Unspecified (Yesterday, 3:59 PM)              I will send an Rx for Ultram   She should continue miralax to move bowels   Needs to reschedule Entyvio start

## 2018-12-31 NOTE — Telephone Encounter
ED Discharge Follow Up  Reached patient: Yes, Direct Clinic Contact with [facility]  Admission [facility] Name : [facility]  ED Admission Date: 12/29/18 ED Discharge Date: 12/29/18 Admission Diagnosis: Pain, abd  Discharge Diagnosis   Abdominal pain, epigastric   Constipation, unspecified constipation type   Crohn's disease with complication, unspecified gastrointestinal tract location ([place])   Leukocytosis, unspecified type   Hospital Services: Unplanned  Today's call is 2 (calendar) days post discharge    Scheduling Follow-up Appointment  Upcoming appointment date and time and with whom scheduled:   Future Appointments   Date Time Provider [place]   01/07/2019  9:30 AM Liane Comber, PA-C [facility] IM   01/20/2019  1:30 PM SCREENING TOMO ROOM [facility] Radiology   02/16/2019 11:00 AM Tibke, Tonna Boehringer, APRN-NP [facility] C[facility] Exam   02/17/2019  1:30 PM Saverio Danker, RD [facility] IM   03/02/2019  3:00 PM Cecilio Asper, MD [facility] IM   03/09/2019  4:00 PM Elenor Legato, MD [facility] IM   03/26/2019 11:20 AM Ted Mcalpine, MD [facility] IM   06/02/2019 11:00 AM Shella Maxim, MD [facility] IM     When was patients last PCP visit: 11/03/2018  PCP primary location: [place]  PCP appointment scheduled? No, routine appt 03/02/2019  Specialist appointment scheduled? No  Both PCP and Specialist appointment scheduled: No  Is assistance with transportation needed? No    ED Communication   Did Pt call Clinic prior to going to ED? No      Almira Bar, [place]

## 2019-01-01 NOTE — Telephone Encounter
Patient has not been contacted to reschedule her 1st Entyvio infusion.  Patient has left message a few days ago.  Therefore, phoned infusion scheduler and now is scheduled for next Thursday at 1:30pm.  Patient has scheduling phone number and will call to see if any cancellations to get infusion done sooner than scheduled.

## 2019-01-05 ENCOUNTER — Encounter: Admit: 2019-01-05 | Discharge: 2019-01-05 | Payer: BC Managed Care – PPO

## 2019-01-06 ENCOUNTER — Encounter: Admit: 2019-01-06 | Discharge: 2019-01-06 | Payer: BC Managed Care – PPO

## 2019-01-07 ENCOUNTER — Encounter: Admit: 2019-01-07 | Discharge: 2019-01-07 | Payer: BC Managed Care – PPO

## 2019-01-07 ENCOUNTER — Ambulatory Visit: Admit: 2019-01-07 | Discharge: 2019-01-08 | Payer: BC Managed Care – PPO

## 2019-01-07 ENCOUNTER — Ambulatory Visit: Admit: 2019-01-07 | Discharge: 2019-01-07 | Payer: BC Managed Care – PPO

## 2019-01-07 MED ORDER — DIPHENHYDRAMINE HCL 25 MG PO CAP
25 mg | Freq: Once | ORAL | 0 refills | Status: CN
Start: 2019-01-07 — End: ?

## 2019-01-07 MED ORDER — ACETAMINOPHEN 500 MG PO TAB
500 mg | Freq: Once | ORAL | 0 refills | Status: CN
Start: 2019-01-07 — End: ?

## 2019-01-07 MED ORDER — VEDOLIZUMAB IVPB
300 mg | Freq: Once | INTRAVENOUS | 0 refills | Status: CN
Start: 2019-01-07 — End: ?

## 2019-01-07 MED ORDER — VEDOLIZUMAB IVPB
300 mg | Freq: Once | INTRAVENOUS | 0 refills | Status: CP
Start: 2019-01-07 — End: ?
  Administered 2019-01-07 (×2): 300 mg via INTRAVENOUS

## 2019-01-07 MED ORDER — ACETAMINOPHEN 500 MG PO TAB
500 mg | Freq: Once | ORAL | 0 refills | Status: CP
Start: 2019-01-07 — End: ?
  Administered 2019-01-07: 18:00:00 500 mg via ORAL

## 2019-01-07 MED ORDER — DIPHENHYDRAMINE HCL 25 MG PO CAP
25 mg | Freq: Once | ORAL | 0 refills | Status: CP
Start: 2019-01-07 — End: ?
  Administered 2019-01-07: 18:00:00 25 mg via ORAL

## 2019-01-07 NOTE — Patient Instructions
My nurse can be reached at [phone].     Please let us know if you have any questions.     Great to see you today!    -  [place], PA-C

## 2019-01-08 ENCOUNTER — Encounter: Admit: 2019-01-08 | Discharge: 2019-01-08 | Payer: BC Managed Care – PPO

## 2019-01-08 MED ORDER — DIPHENHYDRAMINE HCL 50 MG/ML IJ SOLN
25 mg | Freq: Once | INTRAVENOUS | 0 refills | Status: CN
Start: 2019-01-08 — End: ?

## 2019-01-08 NOTE — Telephone Encounter
-----   Message from Viviann Spare, MD sent at 01/08/2019  7:12 AM CDT -----  Labs stable   Continue Weyman Rodney

## 2019-01-08 NOTE — Telephone Encounter
Patient viewed results.  MyChart results letter to patient.

## 2019-01-08 NOTE — Telephone Encounter
Patient was phoned and reviewed 1st Entyvio infusion labs specifically Hgb as patient asking if low enough for blood transfusion and what is cut-off range.  Still with bloody mucous without change.  Did review stable when trending from a few weeks ago by Dr. York Cerise review.  First Entyvio infusion patient feels went well except for itching which felt resolved after infusion completed with PO Benadryl.  Patient asking if OK to have IV Benadryl.  Infusion therapy plan updated for IV Benadryl and routing to Dr. York Cerise for final approval/co-signature.

## 2019-01-12 ENCOUNTER — Encounter: Admit: 2019-01-12 | Discharge: 2019-01-12 | Payer: BC Managed Care – PPO

## 2019-01-13 MED ORDER — TRAMADOL 50 MG PO TAB
ORAL_TABLET | Freq: Four times a day (QID) | 0 refills | Status: DC | PRN
Start: 2019-01-13 — End: 2019-02-15

## 2019-01-13 MED ORDER — ALPRAZOLAM 0.25 MG PO TAB
.25 mg | ORAL_TABLET | Freq: Every evening | ORAL | 0 refills | Status: DC | PRN
Start: 2019-01-13 — End: 2019-03-17

## 2019-01-21 ENCOUNTER — Encounter: Admit: 2019-01-21 | Discharge: 2019-01-21 | Payer: BC Managed Care – PPO

## 2019-01-28 MED ORDER — PEN NEEDLE, DIABETIC 32 GAUGE X 5/32" MISC NDLE
1 | Freq: Three times a day (TID) | 3 refills | 30.00000 days | Status: DC
Start: 2019-01-28 — End: 2019-11-25

## 2019-01-28 NOTE — Telephone Encounter
Refill request received from [facility] pharmacy for pt's insulin pen needles  Refilled per protocol for use with her Humulin R U-500 insulin injections 3X daily  Script sent    Closing encounter

## 2019-01-29 ENCOUNTER — Encounter: Admit: 2019-01-29 | Discharge: 2019-01-29 | Payer: BC Managed Care – PPO

## 2019-01-29 ENCOUNTER — Ambulatory Visit: Admit: 2019-01-29 | Discharge: 2019-01-30 | Payer: BC Managed Care – PPO

## 2019-01-29 DIAGNOSIS — D699 Hemorrhagic condition, unspecified: Secondary | ICD-10-CM

## 2019-01-29 DIAGNOSIS — K219 Gastro-esophageal reflux disease without esophagitis: Secondary | ICD-10-CM

## 2019-01-29 DIAGNOSIS — K50918 Crohn's disease, unspecified, with other complication: Secondary | ICD-10-CM

## 2019-01-29 DIAGNOSIS — K50019 Crohn's disease of small intestine with unspecified complications: Secondary | ICD-10-CM

## 2019-01-29 DIAGNOSIS — K5090 Crohn's disease, unspecified, without complications: Secondary | ICD-10-CM

## 2019-01-29 DIAGNOSIS — I8290 Acute embolism and thrombosis of unspecified vein: Secondary | ICD-10-CM

## 2019-01-29 DIAGNOSIS — E139 Other specified diabetes mellitus without complications: Secondary | ICD-10-CM

## 2019-01-29 DIAGNOSIS — K639 Disease of intestine, unspecified: Secondary | ICD-10-CM

## 2019-01-29 DIAGNOSIS — M797 Fibromyalgia: Secondary | ICD-10-CM

## 2019-01-29 DIAGNOSIS — K7581 Nonalcoholic steatohepatitis (NASH): Secondary | ICD-10-CM

## 2019-01-29 DIAGNOSIS — G40909 Epilepsy, unspecified, not intractable, without status epilepticus: Secondary | ICD-10-CM

## 2019-01-29 DIAGNOSIS — E119 Type 2 diabetes mellitus without complications: Secondary | ICD-10-CM

## 2019-01-29 DIAGNOSIS — G43909 Migraine, unspecified, not intractable, without status migrainosus: Secondary | ICD-10-CM

## 2019-01-29 DIAGNOSIS — R42 Dizziness and giddiness: Secondary | ICD-10-CM

## 2019-01-29 DIAGNOSIS — I10 Essential (primary) hypertension: Secondary | ICD-10-CM

## 2019-01-29 DIAGNOSIS — D4959 Neoplasm of unspecified behavior of other genitourinary organ: Secondary | ICD-10-CM

## 2019-01-29 DIAGNOSIS — M359 Systemic involvement of connective tissue, unspecified: Secondary | ICD-10-CM

## 2019-01-29 DIAGNOSIS — IMO0002 Ulcer: Secondary | ICD-10-CM

## 2019-01-29 DIAGNOSIS — J302 Other seasonal allergic rhinitis: Secondary | ICD-10-CM

## 2019-01-29 MED ORDER — ACETAMINOPHEN 500 MG PO TAB
500 mg | Freq: Once | ORAL | 0 refills | Status: CN
Start: 2019-01-29 — End: ?

## 2019-01-29 MED ORDER — VEDOLIZUMAB IVPB
300 mg | Freq: Once | INTRAVENOUS | 0 refills | Status: CN
Start: 2019-01-29 — End: ?

## 2019-01-29 MED ORDER — ACETAMINOPHEN 500 MG PO TAB
500 mg | Freq: Once | ORAL | 0 refills | Status: CP
Start: 2019-01-29 — End: ?
  Administered 2019-01-29: 14:00:00 500 mg via ORAL

## 2019-01-29 MED ORDER — DIPHENHYDRAMINE HCL 50 MG/ML IJ SOLN
25 mg | Freq: Once | INTRAVENOUS | 0 refills | Status: CN
Start: 2019-01-29 — End: ?

## 2019-01-29 MED ORDER — VEDOLIZUMAB IVPB
300 mg | Freq: Once | INTRAVENOUS | 0 refills | Status: CP
Start: 2019-01-29 — End: ?
  Administered 2019-01-29 (×2): 300 mg via INTRAVENOUS

## 2019-01-29 MED ORDER — DIPHENHYDRAMINE HCL 50 MG/ML IJ SOLN
25 mg | Freq: Once | INTRAVENOUS | 0 refills | Status: CP
Start: 2019-01-29 — End: ?
  Administered 2019-01-29: 14:00:00 25 mg via INTRAVENOUS

## 2019-01-29 MED ORDER — DIPHENHYDRAMINE HCL 25 MG PO CAP
25 mg | Freq: Once | ORAL | 0 refills | Status: CN
Start: 2019-01-29 — End: ?

## 2019-01-29 MED ADMIN — DIPHENHYDRAMINE HCL 50 MG/ML IJ SOLN [2508]: 50 mg | INTRAVENOUS | @ 15:00:00 | Stop: 2019-01-29 | NDC 63323066401

## 2019-01-29 NOTE — Telephone Encounter
Patient presents today for Entyvio infusion.  Last day of antibiotic today for sinus infection.  Last fever was 10 days ago and without s/s of sinus infection.  OK for infusion.  Updating Dr. York Cerise.

## 2019-01-29 NOTE — Progress Notes
Pt began itching prior to end of entyvio infusion.  Benadryl given IV per protocol.

## 2019-01-30 LAB — HEPATITIS C ANTIBODY W REFLEX HCV PCR QUANT

## 2019-01-30 LAB — HEPATITIS B SURFACE AG

## 2019-02-01 ENCOUNTER — Encounter: Admit: 2019-02-01 | Discharge: 2019-02-01 | Payer: BC Managed Care – PPO

## 2019-02-01 NOTE — Telephone Encounter
-----   Message from Viviann Spare, MD sent at 01/29/2019  3:07 PM CDT -----  Labs stable  Hep B,C immune

## 2019-02-01 NOTE — Telephone Encounter
MyChart results letter to patient.

## 2019-02-12 ENCOUNTER — Encounter: Admit: 2019-02-12 | Discharge: 2019-02-12 | Payer: BC Managed Care – PPO

## 2019-02-15 ENCOUNTER — Encounter: Admit: 2019-02-15 | Discharge: 2019-02-15 | Payer: BC Managed Care – PPO

## 2019-02-15 DIAGNOSIS — K5080 Crohn's disease of both small and large intestine without complications: Secondary | ICD-10-CM

## 2019-02-15 MED ORDER — TRAMADOL 50 MG PO TAB
ORAL_TABLET | Freq: Four times a day (QID) | 0 refills | Status: DC | PRN
Start: 2019-02-15 — End: 2019-06-14

## 2019-02-16 ENCOUNTER — Ambulatory Visit: Admit: 2019-02-16 | Discharge: 2019-02-17 | Payer: BC Managed Care – PPO

## 2019-02-16 ENCOUNTER — Encounter: Admit: 2019-02-16 | Discharge: 2019-02-16 | Payer: BC Managed Care – PPO

## 2019-02-16 DIAGNOSIS — I10 Essential (primary) hypertension: Secondary | ICD-10-CM

## 2019-02-16 DIAGNOSIS — G4733 Obstructive sleep apnea (adult) (pediatric): Secondary | ICD-10-CM

## 2019-02-16 DIAGNOSIS — N183 Stage 3 chronic kidney disease, unspecified whether stage 3a or 3b CKD: Secondary | ICD-10-CM

## 2019-02-16 DIAGNOSIS — D4959 Neoplasm of unspecified behavior of other genitourinary organ: Secondary | ICD-10-CM

## 2019-02-16 DIAGNOSIS — M797 Fibromyalgia: Secondary | ICD-10-CM

## 2019-02-16 DIAGNOSIS — K219 Gastro-esophageal reflux disease without esophagitis: Secondary | ICD-10-CM

## 2019-02-16 DIAGNOSIS — K5090 Crohn's disease, unspecified, without complications: Secondary | ICD-10-CM

## 2019-02-16 DIAGNOSIS — IMO0002 Ulcer: Secondary | ICD-10-CM

## 2019-02-16 DIAGNOSIS — G40909 Epilepsy, unspecified, not intractable, without status epilepticus: Secondary | ICD-10-CM

## 2019-02-16 DIAGNOSIS — R609 Edema, unspecified: Secondary | ICD-10-CM

## 2019-02-16 DIAGNOSIS — G43909 Migraine, unspecified, not intractable, without status migrainosus: Secondary | ICD-10-CM

## 2019-02-16 DIAGNOSIS — E139 Other specified diabetes mellitus without complications: Secondary | ICD-10-CM

## 2019-02-16 DIAGNOSIS — K7581 Nonalcoholic steatohepatitis (NASH): Secondary | ICD-10-CM

## 2019-02-16 DIAGNOSIS — E119 Type 2 diabetes mellitus without complications: Secondary | ICD-10-CM

## 2019-02-16 DIAGNOSIS — E7800 Pure hypercholesterolemia, unspecified: Secondary | ICD-10-CM

## 2019-02-16 DIAGNOSIS — K639 Disease of intestine, unspecified: Secondary | ICD-10-CM

## 2019-02-16 DIAGNOSIS — I87009 Postthrombotic syndrome without complications of unspecified extremity: Secondary | ICD-10-CM

## 2019-02-16 DIAGNOSIS — I8290 Acute embolism and thrombosis of unspecified vein: Secondary | ICD-10-CM

## 2019-02-16 DIAGNOSIS — R42 Dizziness and giddiness: Secondary | ICD-10-CM

## 2019-02-16 DIAGNOSIS — D699 Hemorrhagic condition, unspecified: Secondary | ICD-10-CM

## 2019-02-16 DIAGNOSIS — M359 Systemic involvement of connective tissue, unspecified: Secondary | ICD-10-CM

## 2019-02-16 DIAGNOSIS — J302 Other seasonal allergic rhinitis: Secondary | ICD-10-CM

## 2019-02-16 NOTE — Progress Notes
Telehealth Visit Note    Date of Service: 02/16/2019    Subjective:      Obtained patient's verbal consent to treat them and their agreement to [facility] financial policy and NPP via this telehealth visit during the [facility]       Tina Snyder is a 53 y.o. female.    History of Present Illness   I had the pleasure of seeing?Tina Snyder?today?for a 42-month follow up visit. 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 ?CT from December 2016, 4 years after her clotting event,?showed chronic stasis with multiple venous collaterals.?She saw Dr. Hollie Beach evaluation and a CTA of the chest/abdomen and pelvis showed chronic left innominate vein narrowing noted, but no other findings to suggest cause of her generalized swelling. Her right arm is actually larger than her left arm.     When I saw Tina Snyder in July, she was interested in simplifying her medication regimen and had only been taking hydralazine and clonidine once daily.  They both were stopped and she continued on Bystolic 10 mg daily, diltiazem CD 240 mg daily, Spironolactone 25 mg daily and Bumex 1 mg daily.  Her blood pressure has been well controlled on these medications.  In fact, she stopped the diltiazem, because she was waking up feeling fatigued.  Her BP has remained 120's/80-85. She is using her CPAP machine on a regular basis.  She had a Crohn's flare and recently started on Entyvio infusions.  She continues to go to physical therapy for lymphedema, which has helped.  She has noted enlarged lymph nodes in her left upper arm and behind her knees. She has joint pain, possibly related to change in weather. She has had recurrent sinus infections, which she thinks may be related to her CPAP machine.  She requested evaluation at [facility] regarding lymphedema, diabetes and Crohn's disease, but they declined to see her at this time. Her sister and 2 nieces tested positive for COVID-19 last week.  One of her nieces had a fees for for a couple of days, but otherwise they have been doing fine.  She had limited exposure to her sister a few days before she tested positive.     Labs on 10/9 showed stable creatinine 1.51, BUN 28, sodium 138 and potassium 4.8.  Hemoglobin was stable at 10.4.  A lipid panel in July showed LDL 75, HDL 35 and triglycerides 272.  This is much improved from LDL 163 in April 2019.  Her hemoglobin A1c is improving and was thought to be around 7% when she was last seen in the endocrinology clinic.  ?     Review of Systems   Constitutional: Negative.    HENT: Negative.    Eyes: Negative.    Respiratory: Negative.    Cardiovascular: Positive for leg swelling.   Gastrointestinal: Negative.    Endocrine: Negative.    Genitourinary: Negative.    Musculoskeletal: Negative.    Skin: Negative.    Allergic/Immunologic:  Negative.    Neurological: Positive for headaches.   Hematological: Negative.    Psychiatric/Behavioral: Negative.      Objective:         ? ALPRAZolam (XANAX) 0.25 mg tablet TAKE ONE TABLET BY MOUTH AT BEDTIME AS NEEDED FOR ANXIETY.   ? AMITR/GABAPEN/EMU OIL 07/24/08% CREAM (COMPOUND) Apply small amount to affected area   ? atorvastatin (LIPITOR) 40 mg tablet Take one tablet by mouth daily. ? blood sugar diagnostic (ONETOUCH VERIO) test strip Use 1 Strip as directed before meals and at bedtime.   ? Blood-Glucose Sensor (DEXCOM G6 SENSOR) devi Use 1 each as directed as directed. Change every 10 days   ? budesonide (ENTOCORT EC) 3 mg capsule TAKE 3 CAPSULES BY MOUTH IN THE MORNING (Patient taking differently: 3 mg every morning.)   ? bumetanide (BUMEX) 1 mg tablet Take one tablet by mouth daily.   ? buPROPion SR (WELLBUTRIN-SR) 150 mg tablet Take one tablet by mouth daily.   ? cyclobenzaprine (FLEXERIL) 10 mg tablet Take 10 mg by mouth daily as needed. .   ? DEXCOM G6 RECEIVER misc Use 1 each as directed daily.   ? DEXCOM G6 TRANSMITTER devi Use 1 each as directed as directed. Every 90 days   ? dexlansoprazole (DEXILANT) 60 mg capsule Take one capsule by mouth daily. Indications: gastroesophageal reflux disease   ? dicyclomine (BENTYL) 20 mg tablet Take one tablet by mouth every 6 hours.   ? duloxetine DR (CYMBALTA) 60 mg capsule Take 60 mg by mouth daily with breakfast.   ? erythromycin (ROMYCIN) 5 mg/gram (0.5 %) ophthalmic ointment Apply one-half inch to right eye as directed twice daily.   ? HUMULIN R U-500 (CONC) KWIKPEN 500 unit/mL (3 mL) injection PEN Take 280 units before breakfast, 210 units before lunch, and 135 units before dinner. Take about 20 minutes prior to the meal   ? HYDROcodone/acetaminophen (NORCO) 5/325 mg tablet Take one tablet by mouth every 4 hours as needed for Pain   ? insulin pen needles (disposable) (B-D NANO) 32 gauge x 5/32 pen needle Use one each as directed three times daily before meals. Use with insulin injections.   ? lancets (ONE TOUCH DELICA) MISC Use 1 Each as directed before meals and at bedtime. Diag: Diabetes mellitus (E.11)   ? nebivolol (BYSTOLIC) 10 mg tablet Take one tablet by mouth daily.   ? pantoprazole DR (PROTONIX) 40 mg tablet Take one tablet by mouth twice daily. Indications: gastroesophageal reflux disease ? sodium phosphate (FLEET'S) 19-7 gram/118 mL enema Insert or Apply one enema to rectal area as directed daily as needed for up to 3 doses. follow package directions   ? spironolactone (ALDACTONE) 50 mg tablet Take 50 mg by mouth daily.   ? temazepam (RESTORIL) 15 mg capsule Take 15 mg by mouth at bedtime as needed.   ? traMADoL (ULTRAM) 50 mg tablet TAKE 1 TABLET BY MOUTH EVERY 6 HOURS AS NEEDED FOR PAIN     Vitals:    02/16/19 1042   BP: 136/86   Weight: 98.4 kg (217 lb)   Height: 1.626 m (5' 4)     Body mass index is 37.25 kg/m?Marland Kitchen     Telehealth Patient Reported Vitals     Row Name 02/16/19 1044                BP:  136/86        BP Source:  Leg, Left Lower        BP  Position:  Standing        Pulse:  ? not able to obtain         Weight:  98.4 kg (217 lb)        Height:  1.626 m (5' 4)        Pain Score:  Zero        Pain Location:  CHEST            Physical Exam  General Appearance: no acute distress  HEENT: unremarkable  Chest Inspection: respirations even and unlabored  Neurologic Exam: oriented to time, place and person; no evident focal neurologic deficits  Psychiatric: Normal mood and affect.  Behavior is normal. Judgment and thought content normal.        Assessment and Plan:    In conclusion, Tina Snyder seems to be stable from a cardiovascular perspective, without symptoms suggestive of angina, decompensated heart failure or arrhythmias.  Echocardiogram last year showed structurally normal heart. Her generalized edema/lymphedema is controlled on her current diuretics and with lymphedema therapy.  Labs were stable earlier this month.  Her blood pressure is controlled on her current medications.  Her goal blood pressure is 130/80 or less.  If her blood pressure rises above target, she may resume diltiazem. Lipid panel in July was satisfactory.  She will continue atorvastatin 40 mg daily.  She is following closely with the diabetes services team and her hemoglobin A1c is improving.  She actually has had some low blood glucoses recently. I have requested a 29-month follow-up visit with Dr. Vivianne Snyder.  I encouraged her to contact our office in the interim with any questions or concerns.                   20 minutes spent on this patient's encounter with counseling and coordination of care taking >50% of the visit.

## 2019-02-16 NOTE — Patient Instructions
-   It was nice to see you today!    - It sounds like things are stable from a cardiac standpoint. Let's continue your current medications unchanged. If your blood pressure is consistently higher than 130/80, we can resume the diltiazem or consider a different blood pressure medication, like losartan, which is good to treat blood pressure and offers some kidney protection with diabetes.     - I have requested a 6 month follow up visit with Dr. Trudee Kuster. Please send [place] a MyChart message or call our nurse line at [phone] with any questions or concerns in the meantime.

## 2019-02-17 ENCOUNTER — Ambulatory Visit: Admit: 2019-02-17 | Discharge: 2019-02-17 | Payer: BC Managed Care – PPO

## 2019-02-17 ENCOUNTER — Encounter: Admit: 2019-02-17 | Discharge: 2019-02-17 | Payer: BC Managed Care – PPO

## 2019-02-17 MED ORDER — BUDESONIDE 3 MG PO CECX
6 mg | ORAL_CAPSULE | Freq: Every morning | ORAL | 0 refills | 30.00000 days | Status: DC
Start: 2019-02-17 — End: 2019-03-12

## 2019-02-18 ENCOUNTER — Encounter: Admit: 2019-02-18 | Discharge: 2019-02-18 | Payer: BC Managed Care – PPO

## 2019-02-18 MED ORDER — GLUCAGON (HUMAN RECOMBINANT) 1 MG IJ KIT
1 [IU] | Freq: Once | SUBCUTANEOUS | 11 refills | 16.00000 days | Status: AC
Start: 2019-02-18 — End: ?

## 2019-02-22 ENCOUNTER — Encounter: Admit: 2019-02-22 | Discharge: 2019-02-22 | Payer: BC Managed Care – PPO

## 2019-02-23 ENCOUNTER — Encounter: Admit: 2019-02-23 | Discharge: 2019-02-23 | Payer: BC Managed Care – PPO

## 2019-02-23 MED ORDER — ONDANSETRON 4 MG PO TBDI
4 mg | ORAL_TABLET | ORAL | 1 refills | 8.00000 days | Status: DC | PRN
Start: 2019-02-23 — End: 2019-06-16

## 2019-02-23 MED ORDER — DIPHENOXYLATE-ATROPINE 2.5-0.025 MG PO TAB
1 | ORAL_TABLET | Freq: Four times a day (QID) | ORAL | 1 refills | 18.00000 days | Status: DC | PRN
Start: 2019-02-23 — End: 2019-06-28

## 2019-02-25 ENCOUNTER — Encounter: Admit: 2019-02-25 | Discharge: 2019-02-25 | Payer: BC Managed Care – PPO

## 2019-02-26 ENCOUNTER — Encounter: Admit: 2019-02-26 | Discharge: 2019-02-26 | Payer: BC Managed Care – PPO

## 2019-02-26 ENCOUNTER — Ambulatory Visit: Admit: 2019-02-26 | Discharge: 2019-02-27 | Payer: BC Managed Care – PPO

## 2019-02-26 DIAGNOSIS — K5090 Crohn's disease, unspecified, without complications: Secondary | ICD-10-CM

## 2019-02-26 DIAGNOSIS — K50918 Crohn's disease, unspecified, with other complication: Secondary | ICD-10-CM

## 2019-02-26 DIAGNOSIS — K219 Gastro-esophageal reflux disease without esophagitis: Secondary | ICD-10-CM

## 2019-02-26 DIAGNOSIS — J302 Other seasonal allergic rhinitis: Secondary | ICD-10-CM

## 2019-02-26 DIAGNOSIS — E119 Type 2 diabetes mellitus without complications: Secondary | ICD-10-CM

## 2019-02-26 DIAGNOSIS — M797 Fibromyalgia: Secondary | ICD-10-CM

## 2019-02-26 DIAGNOSIS — I8290 Acute embolism and thrombosis of unspecified vein: Secondary | ICD-10-CM

## 2019-02-26 DIAGNOSIS — K639 Disease of intestine, unspecified: Secondary | ICD-10-CM

## 2019-02-26 DIAGNOSIS — D4959 Neoplasm of unspecified behavior of other genitourinary organ: Secondary | ICD-10-CM

## 2019-02-26 DIAGNOSIS — G43909 Migraine, unspecified, not intractable, without status migrainosus: Secondary | ICD-10-CM

## 2019-02-26 DIAGNOSIS — R42 Dizziness and giddiness: Secondary | ICD-10-CM

## 2019-02-26 DIAGNOSIS — D699 Hemorrhagic condition, unspecified: Secondary | ICD-10-CM

## 2019-02-26 DIAGNOSIS — E139 Other specified diabetes mellitus without complications: Secondary | ICD-10-CM

## 2019-02-26 DIAGNOSIS — M359 Systemic involvement of connective tissue, unspecified: Secondary | ICD-10-CM

## 2019-02-26 DIAGNOSIS — G40909 Epilepsy, unspecified, not intractable, without status epilepticus: Secondary | ICD-10-CM

## 2019-02-26 DIAGNOSIS — IMO0002 Ulcer: Secondary | ICD-10-CM

## 2019-02-26 DIAGNOSIS — I10 Essential (primary) hypertension: Secondary | ICD-10-CM

## 2019-02-26 DIAGNOSIS — K7581 Nonalcoholic steatohepatitis (NASH): Secondary | ICD-10-CM

## 2019-02-26 MED ORDER — VEDOLIZUMAB IVPB
300 mg | Freq: Once | INTRAVENOUS | 0 refills | Status: CN
Start: 2019-02-26 — End: ?

## 2019-02-26 MED ORDER — VEDOLIZUMAB IVPB
300 mg | Freq: Once | INTRAVENOUS | 0 refills | Status: CP
Start: 2019-02-26 — End: ?
  Administered 2019-02-26 (×2): 300 mg via INTRAVENOUS

## 2019-02-26 MED ORDER — ACETAMINOPHEN 500 MG PO TAB
500 mg | Freq: Once | ORAL | 0 refills | Status: CN
Start: 2019-02-26 — End: ?

## 2019-02-26 MED ORDER — ACETAMINOPHEN 500 MG PO TAB
500 mg | Freq: Once | ORAL | 0 refills | Status: CP
Start: 2019-02-26 — End: ?
  Administered 2019-02-26: 20:00:00 500 mg via ORAL

## 2019-02-26 MED ORDER — DIPHENHYDRAMINE HCL 50 MG/ML IJ SOLN
25 mg | Freq: Once | INTRAVENOUS | 0 refills | Status: CN
Start: 2019-02-26 — End: ?

## 2019-02-26 MED ORDER — DIPHENHYDRAMINE HCL 50 MG/ML IJ SOLN
25 mg | Freq: Once | INTRAVENOUS | 0 refills | Status: CP
Start: 2019-02-26 — End: ?
  Administered 2019-02-26: 20:00:00 25 mg via INTRAVENOUS

## 2019-02-26 MED ORDER — DIPHENHYDRAMINE HCL 25 MG PO CAP
25 mg | Freq: Once | ORAL | 0 refills | Status: CN
Start: 2019-02-26 — End: ?

## 2019-02-26 MED ADMIN — DIPHENHYDRAMINE HCL 50 MG/ML IJ SOLN [2508]: 25 mg | INTRAVENOUS | @ 21:00:00 | Stop: 2019-02-26 | NDC 00641037621

## 2019-02-26 NOTE — Progress Notes
1530:  Patient tolerated infusion, though near completion of infusion, developed itching on neck/throat area.  Infusion paused and 25 mg IV benadryl given, see MAR for details.  Infusion restarted after symptoms subsided.

## 2019-03-02 ENCOUNTER — Encounter: Admit: 2019-03-02 | Discharge: 2019-03-02 | Payer: BC Managed Care – PPO

## 2019-03-02 ENCOUNTER — Emergency Department: Admit: 2019-03-02 | Discharge: 2019-03-02 | Payer: BC Managed Care – PPO

## 2019-03-02 DIAGNOSIS — R29810 Facial weakness: Secondary | ICD-10-CM

## 2019-03-02 DIAGNOSIS — G510 Bell's palsy: Secondary | ICD-10-CM

## 2019-03-02 LAB — CBC AND DIFF
Lab: 0 % (ref 0–2)
Lab: 0 10*3/uL (ref 0–0.20)
Lab: 0.3 10*3/uL (ref 0–0.45)
Lab: 0.7 10*3/uL (ref 0–0.80)
Lab: 1.7 10*3/uL (ref 1.0–4.8)
Lab: 10 10*3/uL — ABNORMAL HIGH (ref 1.8–7.0)
Lab: 13 10*3/uL — ABNORMAL HIGH (ref 4.5–11.0)
Lab: 3 % (ref 0–5)
Lab: 4.4 M/UL (ref 4.0–5.0)
Lab: 22 — ABNORMAL HIGH (ref <20.7)

## 2019-03-02 LAB — BASIC METABOLIC PANEL
Lab: 1.4 mg/dL — ABNORMAL HIGH (ref 0.4–1.00)
Lab: 101 MMOL/L — ABNORMAL LOW (ref 98–110)
Lab: 11 g/dL — ABNORMAL LOW (ref 3–12)
Lab: 138 MMOL/L — ABNORMAL LOW (ref 137–147)
Lab: 191 mg/dL — ABNORMAL HIGH (ref 70–100)
Lab: 20 mg/dL (ref 7–25)
Lab: 26 MMOL/L — ABNORMAL LOW (ref 21–30)
Lab: 37 mL/min — ABNORMAL LOW (ref >60)
Lab: 4.5 MMOL/L — ABNORMAL LOW (ref 3.5–5.1)
Lab: 45 mL/min — ABNORMAL LOW (ref >60)
Lab: 9.9 mg/dL — ABNORMAL HIGH (ref 8.5–10.6)

## 2019-03-02 LAB — LIVER FUNCTION PANEL
Lab: 0.3 mg/dL (ref 0.3–1.2)
Lab: 19 U/L (ref 7–40)
Lab: 222 U/L — ABNORMAL HIGH (ref 25–110)
Lab: 23 U/L — ABNORMAL LOW (ref 7–56)
Lab: 7.1 g/dL (ref 6.0–8.0)

## 2019-03-02 LAB — POC TROPONIN: Lab: 0 ng/mL (ref 0.00–0.05)

## 2019-03-02 LAB — PTT (APTT): Lab: 26 s (ref <0.4)

## 2019-03-02 LAB — MAGNESIUM: Lab: 1.9 mg/dL (ref 1.6–2.6)

## 2019-03-02 LAB — POC PT/INR: Lab: 0.9 (ref 0.8–1.2)

## 2019-03-02 LAB — POC GLUCOSE: Lab: 188 mg/dL — ABNORMAL HIGH (ref 70–100)

## 2019-03-02 LAB — PROTIME INR (PT): Lab: 1 g/dL (ref 0.8–1.2)

## 2019-03-02 LAB — POC CREATININE, RAD: Lab: 1.4 mg/dL — ABNORMAL HIGH (ref 0.4–1.00)

## 2019-03-02 MED ORDER — SODIUM CHLORIDE 0.9 % IJ SOLN
50 mL | Freq: Once | INTRAVENOUS | 0 refills | Status: CP
Start: 2019-03-02 — End: ?
  Administered 2019-03-02: 20:00:00 50 mL via INTRAVENOUS

## 2019-03-02 MED ORDER — VALACYCLOVIR 500 MG PO TAB
1000 mg | Freq: Once | ORAL | 0 refills | Status: CP
Start: 2019-03-02 — End: ?
  Administered 2019-03-02: 21:00:00 1000 mg via ORAL

## 2019-03-02 MED ORDER — LACTATED RINGERS IV SOLP
500 mL | Freq: Once | INTRAVENOUS | 0 refills | Status: DC
Start: 2019-03-02 — End: 2019-03-02

## 2019-03-02 MED ORDER — IOHEXOL 350 MG IODINE/ML IV SOLN
60 mL | Freq: Once | INTRAVENOUS | 0 refills | Status: CP
Start: 2019-03-02 — End: ?
  Administered 2019-03-02: 20:00:00 60 mL via INTRAVENOUS

## 2019-03-02 MED ORDER — PREDNISONE 20 MG PO TAB
60 mg | Freq: Once | ORAL | 0 refills | Status: CP
Start: 2019-03-02 — End: ?
  Administered 2019-03-02: 21:00:00 60 mg via ORAL

## 2019-03-02 MED ORDER — PREDNISONE 20 MG PO TAB
60 mg | ORAL_TABLET | Freq: Every day | ORAL | 0 refills | Status: AC
Start: 2019-03-02 — End: ?

## 2019-03-02 MED ORDER — VALACYCLOVIR 1 GRAM PO TAB
1000 mg | ORAL_TABLET | ORAL | 0 refills | Status: AC
Start: 2019-03-02 — End: ?

## 2019-03-02 NOTE — ED Notes
Pt verbalized understanding of discharge instructions and ambulated independently to the ED entrance with all belongings and discharge papers.

## 2019-03-02 NOTE — Telephone Encounter
-----   Message from Viviann Spare, MD sent at 02/27/2019 12:37 PM CST -----  Labs stable

## 2019-03-02 NOTE — Telephone Encounter
Patient viewed in [place] [facility] results letter to patient.

## 2019-03-02 NOTE — Acute Stroke Response
Neurology Consultation      Name:  Tina Snyder                                             MRN:  [id number]   Admission Date:  03/02/2019  LOS: 0 days  ED08/08                 ASSESSMENT:    Principal Problem:    Facial droop      Consult type: Opinion    Tina Snyder is a 53 y.o.  female with PMH Crohn's disease s/p resections on immunosuppressants, HTN, HLD, OSA on CPAP, migraine headache, DMII, fibromyalgia, hx of DVT not currently on anticoagulation that neurology is engaged for stroke activation for left face numbness and facial droop. Last normal: 02/26/2019. Left facial numbness and droop started 5 days ago and have progressively worsened with concurrent ear pain. Neck manipulation was done after onset of symptoms without change of symptoms. CT Head without bleed or midline shift. CTA H/N without cervical vessel dissections. 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 Will recommend starting valacyclovir with corticosteroids.      Neuro exam reveals L facial droop, decreased eyebrow raise, left face decreased sensation to light touch    Imaging/Diagnostic Studies:  - Cr 1.4  INR 0.9  Glu 188  WBC 13.4  - CT Head no hemorrhage or mass effect  - CTA H/N minimal plaque at ICA origins without stenosis, right mastoid ear effusion, cervical spondylosis      RECOMMENDATIONS:  > valacyclovir 1 g TID for 7 days   > prednisone 60 mg daily for 7 days      Patient seen and plan of care discussed with Dr. Laureen Ochs and Dr. Vickey Huger and communicated to ED Dr. Hyacinth Meeker.  Dorisann Frames MD PGY-2  [phone]  On voalte  __________________________________________________________________________________    History of Present Illness: Tina Snyder is a  53 y.o. female being seen for neurologic evaluation regarding left facial numbness and left facial droop. She noted symptoms started Friday (5 days ago) initially with facial numbness followed by flaccid mouth droop and decreased eyebrow raise. Numbness progressed in severity. Patient also has headache and L ear pain that began on Friday that have progressed. Patient's brother, a Land, recommended neck manipulation which he performed on Saturday. Patient did not note any difference in symptoms afterwards.     ROS:  +fever 99.36F 02/25/19  +Headache  +L ear pain  +difficulty going up stairs  - No vision changes  - No slurring  - No muscle weekness  - No dyspnea, chest pain      Medical History:   Diagnosis Date   ? Autoimmune disease (HCC)     Chron's   ? Bleeding tendency (HCC)    ? 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 Bowel disease    ? Crohn disease (HCC)    ? Dizziness    ? DM (diabetes mellitus), secondary-steroid-induced 2012   ? Fibromyalgia    ? GERD (gastroesophageal reflux disease)    ? Hypertension    ? Hypertensive heart disease    ? Migraines    ? Seasonal allergic reaction    ? Seizure disorder (HCC)    ? Steatohepatitis     felt secondary to medications   ?  Type 2 diabetes mellitus without complication, with long-term current use of insulin (HCC) 10/22/2017   ? Ulcer    ? Vaginal tumor      Surgical History:   Procedure Laterality Date   ? HX ADENOIDECTOMY  05/1995   ? LUNG SURGERY  10/22/2010    lung biopsy   ? LIVER BIOPSY  12/2010   ? ESOPHAGOGASTRODUODENOSCOPY N/A 03/13/2015    Performed by Elyn Aquas, [facility] at [facility] ENDO   ? COLONOSCOPY N/A 03/13/2015    Performed by Elyn Aquas, MD at [facility] ENDO   ? ESOPHAGOGASTRODUODENOSCOPY BIOPSY  03/13/2015    Performed by Elyn Aquas, MD at [facility] ENDO   ? COLONOSCOPY BIOPSY  03/13/2015    Performed by Elyn Aquas, MD at [facility] ENDO   ? ESOPHAGOGASTRODUODENOSCOPY WITH SPECIMEN COLLECTION BY BRUSHING/ WASHING N/A 06/30/2017    Performed by Jolee Ewing, MD at [facility] ENDO   ? COLONOSCOPY DIAGNOSTIC WITH SPECIMEN COLLECTION BY BRUSHING/ WASHING - FLEXIBLE N/A 06/30/2017 Performed by Jolee Ewing, MD at [facility] ENDO   ? ESOPHAGOGASTRODUODENOSCOPY WITH BIOPSY - FLEXIBLE N/A 06/30/2017    Performed by Jolee Ewing, MD at [facility] ENDO   ? COLONOSCOPY WITH BIOPSY - FLEXIBLE  06/30/2017    Performed by Jolee Ewing, MD at [facility] ENDO   ? COLONOSCOPY DIAGNOSTIC WITH SPECIMEN COLLECTION BY BRUSHING/ WASHING - FLEXIBLE N/A 11/11/2018    Performed by Tommie Sams, MD at [facility] ENDO   ? ESOPHAGOGASTRODUODENOSCOPY WITH SPECIMEN COLLECTION BY BRUSHING/ WASHING N/A 11/11/2018    Performed by Tommie Sams, MD at [facility] ENDO   ? ESOPHAGOGASTRODUODENOSCOPY WITH BIOPSY - FLEXIBLE  11/11/2018    Performed by Tommie Sams, MD at [facility] ENDO   ? COLONOSCOPY DIAGNOSTIC WITH SPECIMEN COLLECTION BY BRUSHING/ WASHING - FLEXIBLE N/A 11/12/2018    Performed by Veneta Penton, MD at [facility] ENDO   ? COLONOSCOPY WITH BIOPSY - FLEXIBLE  11/12/2018    Performed by Veneta Penton, MD at [facility] ENDO   ? APPENDECTOMY     ? BRONCHOSCOPY     ? CHOLECYSTECTOMY     ? GASTRIC FUNDOPLICATION     ? HEMORRHOIDECTOMY     ? HX BREAST LUMPECTOMY     ? HX ENDOSCOPY      colonoscopy 2014   ? HX HYSTERECTOMY      partial 02/1995, ovaries removed 1999   ? OOPHORECTOMY      1999 ovaries removed   ? SMALL BOWEL RESECTION     ? SUBTOTAL COLECTOMY      Ileocolectomy      Social History     Socioeconomic History   ? Marital status: Married     Spouse name: Susy Frizzle   ? Number of children: 2   ? Years of education: Not on file   ? Highest education level: Not on file   Occupational History   ? 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 Smoking status: Never Smoker   ? Smokeless tobacco: Never Used   Substance and Sexual Activity   ? Alcohol use: No     Alcohol/week: 0.0 standard drinks   ? Drug use: No   ? Sexual activity: Not on file   Other Topics Concern   ? 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      Family History   Problem Relation Age of Onset   ? Celiac Disease Son    ? Asthma Son    ? Hypertension Mother    ? Depression Mother    ? Basal Cell Carcinoma Mother    ? Hypertension Father    ? High Cholesterol Father    ? Depression Father    ? Hypertension Maternal Grandmother    ? Stroke Maternal Grandmother    ? Heart Failure Maternal Grandfather    ? Stroke Maternal Grandfather    ? Migraines Maternal Grandfather    ? Cancer Paternal Grandmother         breast   ? Hypertension Paternal Grandmother    ? High Cholesterol Paternal Grandmother    ? Depression Paternal Grandmother    ? Heart Failure Paternal Grandfather    ? Hypertension Paternal Grandfather    ? Blood Clots Neg Hx    ? COPD Neg Hx    ? Coronary Artery Disease Neg Hx    ? Cystic Fibrosis Neg Hx    ? DVT Neg Hx    ? Pulmonary Embolism Neg Hx    ? Pulmonary Fibrosis Neg Hx    ? Pulmonary HTN Neg Hx    ? Melanoma Neg Hx          Immunizations (includes history and patient reported):   Immunization History   Administered Date(s) Administered   ? FLU VACCINE >3YO 01/10/2011, 01/06/2012   ? FLU VACCINE >3YO (Preservative Free) 03/04/2011   ? Pneumococcal Vaccine (23-Val Adult) 02/26/2011   ? Tdap Vaccine 09/30/2017           Allergies:  Bee venom protein (honey bee); Morphine; Bee [bumble bee]; Ciprofloxacin; Compazine [prochlorperazine]; Coumadin [warfarin]; Flagyl [metronidazole]; Lovenox [enoxaparin]; Reglan [metoclopramide]; Stadol [butorphanol tartrate]; Sulfa (sulfonamide antibiotics); Tysabri [natalizumab]; Walnut; Zyrtec [cetirizine]; and Pecan nut    Medications:  Current Facility-Administered Medications   Medication   ? lactated ringers infusion   ? predniSONE (DELTASONE) tablet 60 mg   ? valACYclovir (VALTREX) tablet 1,000 mg     Current Outpatient Medications   Medication Sig ? ALPRAZolam (XANAX) 0.25 mg tablet TAKE ONE TABLET BY MOUTH AT BEDTIME AS NEEDED FOR ANXIETY.   ? AMITR/GABAPEN/EMU OIL 07/24/08% CREAM (COMPOUND) Apply small amount to affected area   ? atorvastatin (LIPITOR) 40 mg tablet Take one tablet by mouth daily.   ? blood sugar diagnostic (ONETOUCH VERIO) test strip Use 1 Strip as directed before meals and at bedtime.   ? Blood-Glucose Sensor (DEXCOM G6 SENSOR) devi Use 1 each as directed as directed. Change every 10 days   ? budesonide (ENTOCORT EC) 3 mg capsule Take two capsules by mouth every morning.   ? bumetanide (BUMEX) 1 mg tablet Take one tablet by mouth daily.   ? buPROPion SR (WELLBUTRIN-SR) 150 mg tablet Take one tablet by mouth daily.   ? cyclobenzaprine (FLEXERIL) 10 mg tablet Take 10 mg by mouth daily as needed. .   ? DEXCOM G6 RECEIVER misc Use 1 each as directed daily.   ? DEXCOM G6 TRANSMITTER devi Use 1 each as directed as directed. Every 90 days   ? dexlansoprazole (DEXILANT) 60 mg capsule Take one capsule by mouth daily. Indications: gastroesophageal reflux disease   ? dicyclomine (BENTYL) 20 mg tablet Take one tablet by mouth every 6 hours.   ?  diphenoxylate/atropine (LOMOTIL) 2.5/0.025 mg tablet Take one tablet by mouth four times daily as needed for Diarrhea.   ? duloxetine DR (CYMBALTA) 60 mg capsule Take 60 mg by mouth daily with breakfast.   ? erythromycin (ROMYCIN) 5 mg/gram (0.5 %) ophthalmic ointment Apply one-half inch to right eye as directed twice daily.   ? HUMULIN R U-500 (CONC) KWIKPEN 500 unit/mL (3 mL) injection PEN Take 280 units before breakfast, 210 units before lunch, and 135 units before dinner. Take about 20 minutes prior to the meal   ? HYDROcodone/acetaminophen (NORCO) 5/325 mg tablet Take one tablet by mouth every 4 hours as needed for Pain   ? insulin pen needles (disposable) (B-D NANO) 32 gauge x 5/32 pen needle Use one each as directed three times daily before meals. Use with insulin injections. ? lancets (ONE TOUCH DELICA) MISC Use 1 Each as directed before meals and at bedtime. Diag: Diabetes mellitus (E.11)   ? nebivolol (BYSTOLIC) 10 mg tablet Take one tablet by mouth daily.   ? ondansetron (ZOFRAN ODT) 4 mg rapid dissolve tablet Dissolve one tablet by mouth every 8 hours as needed for Nausea or Vomiting. Place on tongue to disolve.   ? pantoprazole DR (PROTONIX) 40 mg tablet Take one tablet by mouth twice daily. Indications: gastroesophageal reflux disease   ? predniSONE (DELTASONE) 20 mg tablet Take three tablets by mouth daily for 6 days.   ? sodium phosphate (FLEET'S) 19-7 gram/118 mL enema Insert or Apply one enema to rectal area as directed daily as needed for up to 3 doses. follow package directions   ? spironolactone (ALDACTONE) 50 mg tablet Take 50 mg by mouth daily.   ? temazepam (RESTORIL) 15 mg capsule Take 15 mg by mouth at bedtime as needed.   ? traMADoL (ULTRAM) 50 mg tablet TAKE 1 TABLET BY MOUTH EVERY 6 HOURS AS NEEDED FOR PAIN   ? valACYclovir (VALTREX) 1 gram tablet Take one tablet by mouth every 8 hours for 7 days.   ? vedolizumab (ENTYVIO IV) Administer  through vein.     No current facility-administered medications on file prior to encounter.      Current Outpatient Medications on File Prior to Encounter   Medication Sig Dispense Refill   ? ALPRAZolam (XANAX) 0.25 mg tablet TAKE ONE TABLET BY MOUTH AT BEDTIME AS NEEDED FOR ANXIETY. 20 tablet 0   ? AMITR/GABAPEN/EMU OIL 07/24/08% CREAM (COMPOUND) Apply small amount to affected area 50 g 3   ? atorvastatin (LIPITOR) 40 mg tablet Take one tablet by mouth daily. 90 tablet 3   ? blood sugar diagnostic (ONETOUCH VERIO) test strip Use 1 Strip as directed before meals and at bedtime. 300 Strip 3   ? Blood-Glucose Sensor (DEXCOM G6 SENSOR) devi Use 1 each as directed as directed. Change every 10 days 9 each 4   ? budesonide (ENTOCORT EC) 3 mg capsule Take two capsules by mouth every morning. 60 capsule 0 ? bumetanide (BUMEX) 1 mg tablet Take one tablet by mouth daily. 90 tablet 1   ? buPROPion SR (WELLBUTRIN-SR) 150 mg tablet Take one tablet by mouth daily. 90 tablet 3   ? cyclobenzaprine (FLEXERIL) 10 mg tablet Take 10 mg by mouth daily as needed. .     ? DEXCOM G6 RECEIVER misc Use 1 each as directed daily. 1 each 1   ? DEXCOM G6 TRANSMITTER devi Use 1 each as directed as directed. Every 90 days 1 Device 6   ? dexlansoprazole (DEXILANT) 60 mg  capsule Take one capsule by mouth daily. Indications: gastroesophageal reflux disease 30 capsule 5   ? dicyclomine (BENTYL) 20 mg tablet Take one tablet by mouth every 6 hours. 120 tablet 0   ? diphenoxylate/atropine (LOMOTIL) 2.5/0.025 mg tablet Take one tablet by mouth four times daily as needed for Diarrhea. 60 tablet 1   ? duloxetine DR (CYMBALTA) 60 mg capsule Take 60 mg by mouth daily with breakfast.     ? erythromycin (ROMYCIN) 5 mg/gram (0.5 %) ophthalmic ointment Apply one-half inch to right eye as directed twice daily. 3.5 g 1   ? HUMULIN R U-500 (CONC) KWIKPEN 500 unit/mL (3 mL) injection PEN Take 280 units before breakfast, 210 units before lunch, and 135 units before dinner. Take about 20 minutes prior to the meal 150 mL 3   ? HYDROcodone/acetaminophen (NORCO) 5/325 mg tablet Take one tablet by mouth every 4 hours as needed for Pain 30 tablet 0   ? insulin pen needles (disposable) (B-D NANO) 32 gauge x 5/32 pen needle Use one each as directed three times daily before meals. Use with insulin injections. 300 each 3   ? lancets (ONE TOUCH DELICA) MISC Use 1 Each as directed before meals and at bedtime. Diag: Diabetes mellitus (E.11) 300 Each 11   ? nebivolol (BYSTOLIC) 10 mg tablet Take one tablet by mouth daily. 30 tablet 0   ? ondansetron (ZOFRAN ODT) 4 mg rapid dissolve tablet Dissolve one tablet by mouth every 8 hours as needed for Nausea or Vomiting. Place on tongue to disolve. 60 tablet 1 ? pantoprazole DR (PROTONIX) 40 mg tablet Take one tablet by mouth twice daily. Indications: gastroesophageal reflux disease 180 tablet 3   ? sodium phosphate (FLEET'S) 19-7 gram/118 mL enema Insert or Apply one enema to rectal area as directed daily as needed for up to 3 doses. follow package directions 1 enema 0   ? spironolactone (ALDACTONE) 50 mg tablet Take 50 mg by mouth daily.     ? temazepam (RESTORIL) 15 mg capsule Take 15 mg by mouth at bedtime as needed.     ? traMADoL (ULTRAM) 50 mg tablet TAKE 1 TABLET BY MOUTH EVERY 6 HOURS AS NEEDED FOR PAIN 30 tablet 0   ? vedolizumab (ENTYVIO IV) Administer  through vein.         Physical Exam:  Vital Signs: Last Filed In 24 Hours Vital Signs: 24 Hour Range   Pulse: 84 (11/10 1500)  Respirations: 25 PER MINUTE (11/10 1500)  SpO2: 99 % (11/10 1500)  SpO2 Pulse: 82 (11/10 1500) Pulse:  [81-88]   Respirations:  [18 PER MINUTE-25 PER MINUTE]   SpO2:  [99 %-100 %]           HEENT: normocephalic, eyes open with no discharge, nares patent, oropharynx is clear with no lesions, palate intact  Chest: normal configuration, non labored breathing, chest rise equal b/l   CV: normal rate,  distal pulses palpable  Pulmonary: on RA, no wheezes/crackles/rales  Abd: soft, obese, non- tender  Skin: no rashes or lesions  Psych: normal     Neuro:  Mental status: Oriented to person/place/time/situation  Memory: Recent and remote memory intact  Attention: Good span, follows conversation.  Fund of knowledge: Appropriate  Level of consciousness: Alert    Speech:    Fluency: Normal   Comprehension: Normal   Articulation: Normal   Naming: Normal    CN II-XII:  Visual fields intact in all fields, PERRL, EOMI, facial sensation decreased over L  side. Hearing affected/slightly decreased in L side. Decreased L eyebrow raise. +L Facial droop. Strong cough, elevates palate b/l, uvula midline. Strong shoulder shrug. Tongue midline.    Motor: Normal tone and bulk. No abnormal movement, fasciculation or pronator drift.   NF5  NE5  SA  EF  EE  WE  WF  FF  FE  FA  TA  HF  HA  HE  KF  KE  DF  PF  In  Ev  TF  TE    R     5  5  5  5  5  5  5  5    5    5    5  5      5  5     L    5  5  5  5  5  5  5  5   5      5   5  5     5  5       Sensory:   Light touch: decreased over b/l hands    Cerebellar Funciton/fine movement: normal finger to nose  Gait: normal rise from seated position; normal stride length and arm swing, no decompensation of turns. Able to transfer self without assistance.    Lab/Radiology/Other Diagnostic Tests:  24-hour labs:    Results for orders placed or performed during the hospital encounter of 03/02/19 (from the past 24 hour(s))   POC PT/INR    Collection Time: 03/02/19  2:05 PM   Result Value Ref Range    INR POC 0.9 0.8 - 1.2   POC GLUCOSE    Collection Time: 03/02/19  2:08 PM   Result Value Ref Range    Glucose, POC 188 (H) 70 - 100 MG/DL   POC TROPONIN    Collection Time: 03/02/19  2:08 PM   Result Value Ref Range    Troponin-I-POC 0.00 0.00 - 0.05 NG/ML   POC CREATININE, RAD    Collection Time: 03/02/19  2:09 PM   Result Value Ref Range    Creatinine, POC 1.4 (H) 0.4 - 1.00 MG/DL   CBC AND DIFF    Collection Time: 03/02/19  2:12 PM   Result Value Ref Range    White Blood Cells 13.4 (H) 4.5 - 11.0 K/UL    RBC 4.40 4.0 - 5.0 M/UL    Hemoglobin 10.2 (L) 12.0 - 15.0 GM/DL    Hematocrit 16.1 (L) 36 - 45 %    MCV 73.7 (L) 80 - 100 FL    MCH 23.3 (L) 26 - 34 PG    MCHC 31.5 (L) 32.0 - 36.0 G/DL    RDW 09.6 (H) 11 - 15 %    Platelet Count 328 150 - 400 K/UL    MPV 8.0 7 - 11 FL    Neutrophils 78 (H) 41 - 77 %    Lymphocytes 13 (L) 24 - 44 %    Monocytes 6 4 - 12 %    Eosinophils 3 0 - 5 %    Basophils 0 0 - 2 %    Absolute Neutrophil Count 10.56 (H) 1.8 - 7.0 K/UL    Absolute Lymph Count 1.73 1.0 - 4.8 K/UL    Absolute Monocyte Count 0.75 0 - 0.80 K/UL    Absolute Eosinophil Count 0.34 0 - 0.45 K/UL    Absolute Basophil Count 0.05 0 - 0.20 K/UL    MDW (Monocyte Distribution Width) 22.2 (H) <20.7  BASIC METABOLIC PANEL    Collection Time: 03/02/19  2:12 PM   Result Value Ref Range    Sodium 138 137 - 147 MMOL/L    Potassium 4.5 3.5 - 5.1 MMOL/L    Chloride 101 98 - 110 MMOL/L    CO2 26 21 - 30 MMOL/L    Anion Gap 11 3 - 12    Glucose 191 (H) 70 - 100 MG/DL    Blood Urea Nitrogen 20 7 - 25 MG/DL    Creatinine 1.61 (H) 0.4 - 1.00 MG/DL    Calcium 9.9 8.5 - 09.6 MG/DL    eGFR Non African American 37 (L) >60 mL/min    eGFR African American 45 (L) >60 mL/min   MAGNESIUM    Collection Time: 03/02/19  2:12 PM   Result Value Ref Range    Magnesium 1.9 1.6 - 2.6 mg/dL   LIVER FUNCTION PANEL    Collection Time: 03/02/19  2:12 PM   Result Value Ref Range    Total Bilirubin 0.3 0.3 - 1.2 MG/DL    Bilirubin, Direct <0.4 <0.4 MG/DL    Albumin 4.2 3.5 - 5.0 G/DL    Alk Phosphatase 540 (H) 25 - 110 U/L    AST (SGOT) 19 7 - 40 U/L    ALT (SGPT) 23 7 - 56 U/L    Total Protein 7.1 6.0 - 8.0 G/DL   PTT (APTT)    Collection Time: 03/02/19  2:12 PM   Result Value Ref Range    APTT 26.5 24.0 - 36.5 SEC   PROTIME INR (PT)    Collection Time: 03/02/19  2:12 PM   Result Value Ref Range    INR 1.0 0.8 - 1.2     Glucose: (!) 191 (03/02/19 1412)  POC Glucose (Download): (!) 188 (03/02/19 1408)    Pertinent radiology reviewed.    Dorisann Frames, MD  Neurology Resident

## 2019-03-02 NOTE — ED Notes
Pt presents to ED for evaluation of left sided facial tingling. Pt states symptoms started on Friday but acute worsened this morning around 0930. Pt denies vision changes. Pt states she is unable to close her left eyelid all the way, states she feels like her lips are numb. Pt endorses mild tingling to left hand. Pt A&Ox4, VSS, breaths even non labored, airway intact, Glennon Mac, MD and Sabra Heck, MD and  stroke team present upon arrival to ED CT scanner    Belongings:    Glasses, fabric mask, shirt, bra, leggings, socks, shoes

## 2019-03-02 NOTE — ED Notes
Pt states, "They keep asking me about a rash. I remembered I have this spot on my chest and on my shoulder." Showed this RN dry skin with a few bumps on mid chest and right shoulder. Dr. Sabra Heck notified. No redness noted.

## 2019-03-03 ENCOUNTER — Encounter: Admit: 2019-03-03 | Discharge: 2019-03-03 | Payer: BC Managed Care – PPO

## 2019-03-03 NOTE — Telephone Encounter
Scheduled a H F/U appt with pcp on Thurs 03/17/19 @ 1000.  CB  LPN

## 2019-03-04 ENCOUNTER — Encounter: Admit: 2019-03-04 | Discharge: 2019-03-04 | Payer: BC Managed Care – PPO

## 2019-03-04 NOTE — Telephone Encounter
ED Discharge Follow Up  Reached patient: Yes, Direct Clinic Contact with PCP office and has f/u 03/17/2019  Admission Information  Hospital Name : [facility]  ED Admission Date: 03/02/19 ED Discharge Date: 03/02/19 Admission Diagnosis: facial/eye tingling, facial droop  Discharge Diagnosis   Bell's palsy   Middle ear effusion, right   Hospital Services: Unplanned  Today's call is 2 (calendar) days post discharge    ? ALPRAZolam (XANAX) 0.25 mg tablet TAKE ONE TABLET BY MOUTH AT BEDTIME AS NEEDED FOR ANXIETY.   ? AMITR/GABAPEN/EMU OIL 07/24/08% CREAM (COMPOUND) Apply small amount to affected area   ? atorvastatin (LIPITOR) 40 mg tablet Take one tablet by mouth daily.   ? blood sugar diagnostic (ONETOUCH VERIO) test strip Use 1 Strip as directed before meals and at bedtime.   ? Blood-Glucose Sensor (DEXCOM G6 SENSOR) devi Use 1 each as directed as directed. Change every 10 days   ? budesonide (ENTOCORT EC) 3 mg capsule Take two capsules by mouth every morning.   ? bumetanide (BUMEX) 1 mg tablet Take one tablet by mouth daily.   ? buPROPion SR (WELLBUTRIN-SR) 150 mg tablet Take one tablet by mouth daily.   ? cyclobenzaprine (FLEXERIL) 10 mg tablet Take 10 mg by mouth daily as needed. .   ? DEXCOM G6 RECEIVER misc Use 1 each as directed daily.   ? DEXCOM G6 TRANSMITTER devi Use 1 each as directed as directed. Every 90 days   ? dexlansoprazole (DEXILANT) 60 mg capsule Take one capsule by mouth daily. Indications: gastroesophageal reflux disease   ? dicyclomine (BENTYL) 20 mg tablet Take one tablet by mouth every 6 hours.   ? diphenoxylate/atropine (LOMOTIL) 2.5/0.025 mg tablet Take one tablet by mouth four times daily as needed for Diarrhea.   ? duloxetine DR (CYMBALTA) 60 mg capsule Take 60 mg by mouth daily with breakfast.   ? erythromycin (ROMYCIN) 5 mg/gram (0.5 %) ophthalmic ointment Apply one-half inch to right eye as directed twice daily. ? HUMULIN R U-500 (CONC) KWIKPEN 500 unit/mL (3 mL) injection PEN Take 280 units before breakfast, 210 units before lunch, and 135 units before dinner. Take about 20 minutes prior to the meal   ? HYDROcodone/acetaminophen (NORCO) 5/325 mg tablet Take one tablet by mouth every 4 hours as needed for Pain   ? insulin pen needles (disposable) (B-D NANO) 32 gauge x 5/32 pen needle Use one each as directed three times daily before meals. Use with insulin injections.   ? lancets (ONE TOUCH DELICA) MISC Use 1 Each as directed before meals and at bedtime. Diag: Diabetes mellitus (E.11)   ? nebivolol (BYSTOLIC) 10 mg tablet Take one tablet by mouth daily.   ? ondansetron (ZOFRAN ODT) 4 mg rapid dissolve tablet Dissolve one tablet by mouth every 8 hours as needed for Nausea or Vomiting. Place on tongue to disolve.   ? pantoprazole DR (PROTONIX) 40 mg tablet Take one tablet by mouth twice daily. Indications: gastroesophageal reflux disease   ? predniSONE (DELTASONE) 20 mg tablet Take three tablets by mouth daily for 6 days.   ? sodium phosphate (FLEET'S) 19-7 gram/118 mL enema Insert or Apply one enema to rectal area as directed daily as needed for up to 3 doses. follow package directions   ? spironolactone (ALDACTONE) 50 mg tablet Take 50 mg by mouth daily.   ? temazepam (RESTORIL) 15 mg capsule Take 15 mg by mouth at bedtime as needed.   ? traMADoL (ULTRAM) 50 mg tablet TAKE  1 TABLET BY MOUTH EVERY 6 HOURS AS NEEDED FOR PAIN   ? valACYclovir (VALTREX) 1 gram tablet Take one tablet by mouth every 8 hours for 7 days.   ? vedolizumab (ENTYVIO IV) Administer  through vein.       Scheduling Follow-up Appointment  Upcoming appointment date and time and with whom scheduled:   Future Appointments   Date Time Provider Department Center   03/09/2019  4:00 PM Elmo Putt, MD [facility]   03/17/2019 10:00 AM Annia Belt Becky Sax, MD [facility]   03/26/2019 11:20 AM Konrad Felix, MD [facility] 06/02/2019 11:00 AM Michele Rockers, MD [facility]   06/02/2019  1:00 PM Martie Round, PA-C [facility]     When was patient?s last PCP visit: 11/03/2018  PCP primary location: [facility]  PCP appointment scheduled? Yes, Date: 03/17/2019   Specialist appointment scheduled? No  Both PCP and Specialist appointment scheduled: No  Is assistance with transportation needed? No    ED Communication   Did Pt call Clinic prior to going to ED? No      Lavell Islam, [place]

## 2019-03-05 ENCOUNTER — Encounter: Admit: 2019-03-05 | Discharge: 2019-03-05 | Payer: BC Managed Care – PPO

## 2019-03-05 DIAGNOSIS — E1165 Type 2 diabetes mellitus with hyperglycemia: Secondary | ICD-10-CM

## 2019-03-05 MED ORDER — HUMULIN R U-500 (CONC) KWIKPEN 500 UNIT/ML (3 ML) SC INPN
Freq: Every morning | SUBCUTANEOUS | 3 refills | 32.00000 days | Status: AC
Start: 2019-03-05 — End: ?

## 2019-03-09 ENCOUNTER — Encounter: Admit: 2019-03-09 | Discharge: 2019-03-09 | Payer: BC Managed Care – PPO

## 2019-03-09 ENCOUNTER — Ambulatory Visit: Admit: 2019-03-09 | Discharge: 2019-03-10 | Payer: BC Managed Care – PPO

## 2019-03-09 DIAGNOSIS — R42 Dizziness and giddiness: Secondary | ICD-10-CM

## 2019-03-09 DIAGNOSIS — M797 Fibromyalgia: Secondary | ICD-10-CM

## 2019-03-09 DIAGNOSIS — I10 Essential (primary) hypertension: Secondary | ICD-10-CM

## 2019-03-09 DIAGNOSIS — K219 Gastro-esophageal reflux disease without esophagitis: Secondary | ICD-10-CM

## 2019-03-09 DIAGNOSIS — J302 Other seasonal allergic rhinitis: Secondary | ICD-10-CM

## 2019-03-09 DIAGNOSIS — I8290 Acute embolism and thrombosis of unspecified vein: Secondary | ICD-10-CM

## 2019-03-09 DIAGNOSIS — M359 Systemic involvement of connective tissue, unspecified: Secondary | ICD-10-CM

## 2019-03-09 DIAGNOSIS — D4959 Neoplasm of unspecified behavior of other genitourinary organ: Secondary | ICD-10-CM

## 2019-03-09 DIAGNOSIS — G40909 Epilepsy, unspecified, not intractable, without status epilepticus: Secondary | ICD-10-CM

## 2019-03-09 DIAGNOSIS — D699 Hemorrhagic condition, unspecified: Secondary | ICD-10-CM

## 2019-03-09 DIAGNOSIS — IMO0002 Ulcer: Secondary | ICD-10-CM

## 2019-03-09 DIAGNOSIS — K639 Disease of intestine, unspecified: Secondary | ICD-10-CM

## 2019-03-09 DIAGNOSIS — E139 Other specified diabetes mellitus without complications: Secondary | ICD-10-CM

## 2019-03-09 DIAGNOSIS — E119 Type 2 diabetes mellitus without complications: Secondary | ICD-10-CM

## 2019-03-09 DIAGNOSIS — K5090 Crohn's disease, unspecified, without complications: Secondary | ICD-10-CM

## 2019-03-09 DIAGNOSIS — K7581 Nonalcoholic steatohepatitis (NASH): Secondary | ICD-10-CM

## 2019-03-09 DIAGNOSIS — G43909 Migraine, unspecified, not intractable, without status migrainosus: Secondary | ICD-10-CM

## 2019-03-09 IMAGING — US VDUPUELT
1 series · 14 of 16 positions shown · non-contrast
Comparison: none

[Series 1: us venous duplex (person_name) ext left · portal-venous · 14 of 66 slices shown]
[im 1/66]
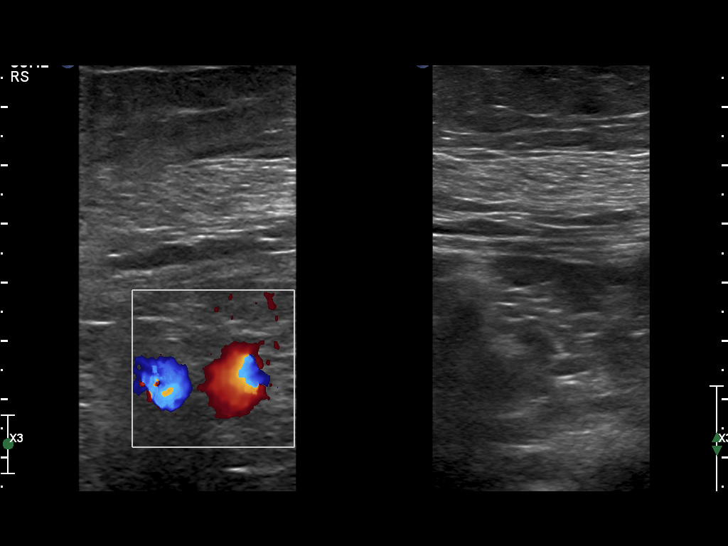
[im 5/66]
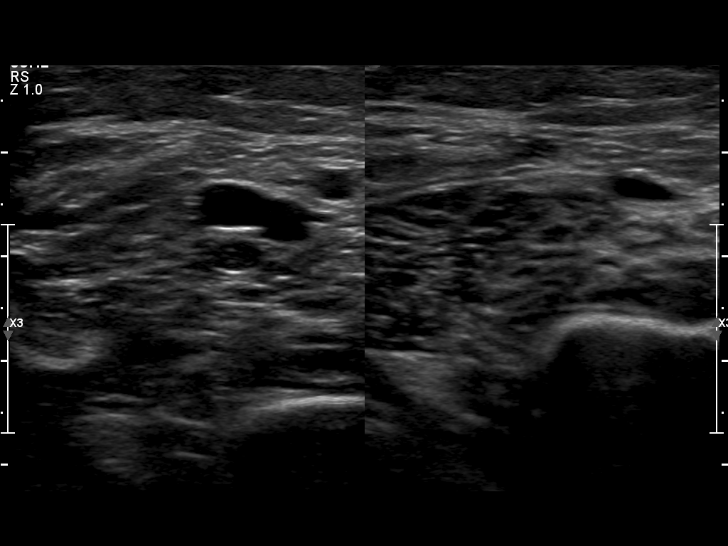
[im 9/66]
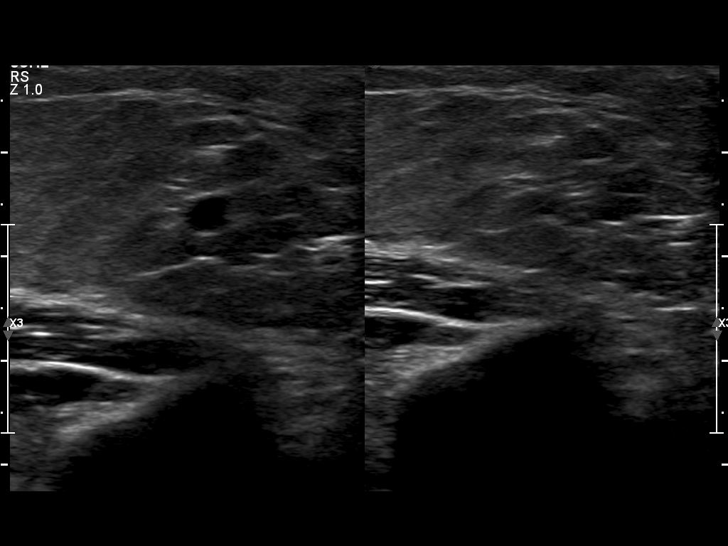
[im 18/66]
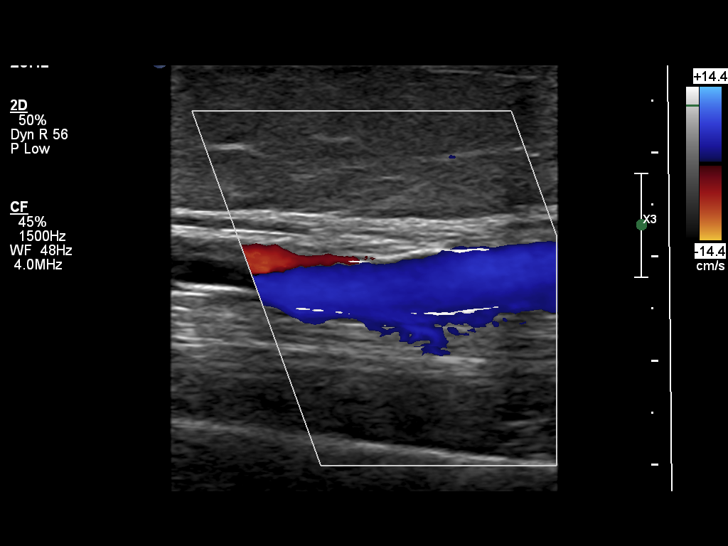
[im 22/66]
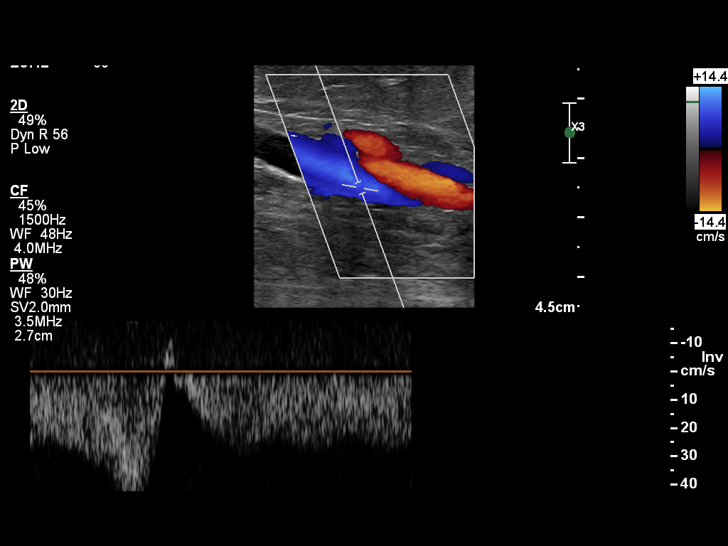
[im 27/66]
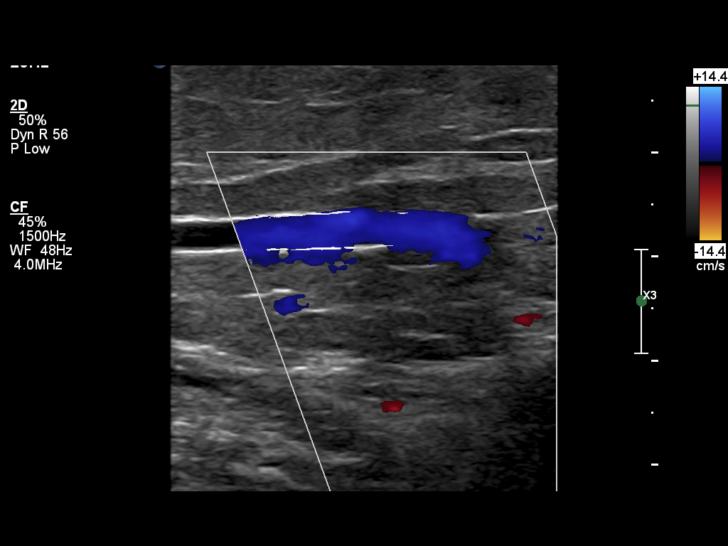
[im 31/66]
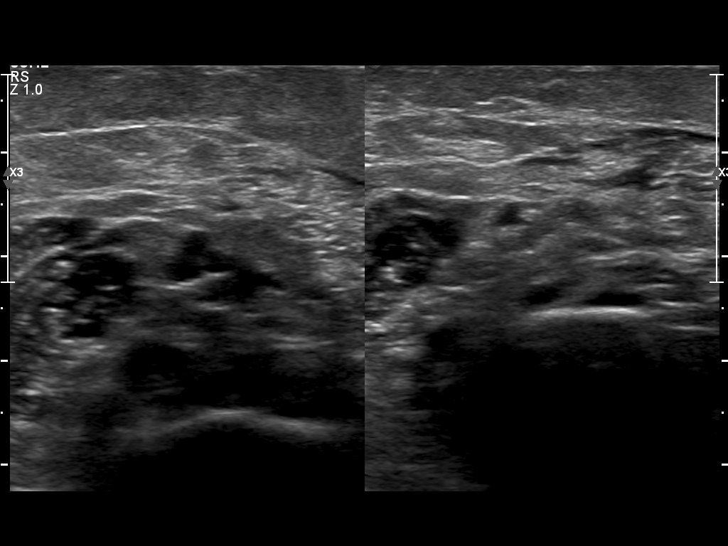
[im 35/66]
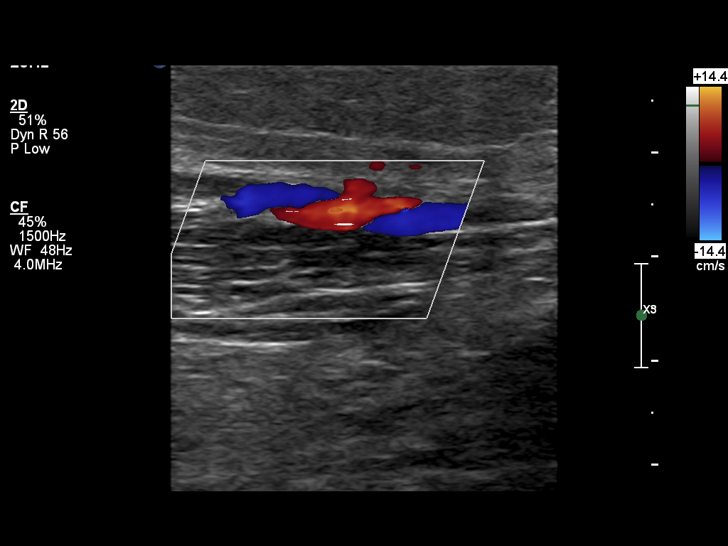
[im 40/66]
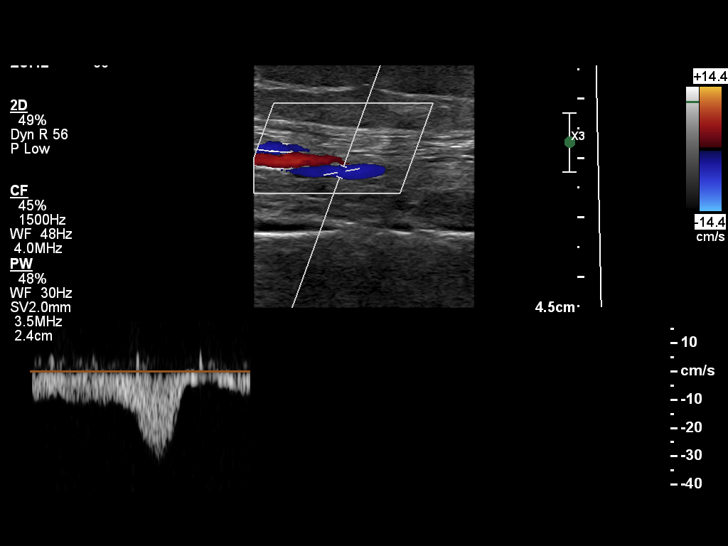
[im 44/66]
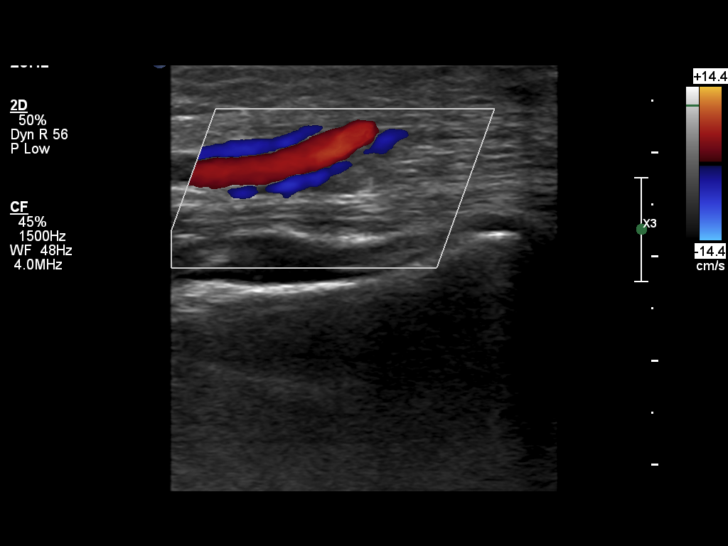
[im 53/66]
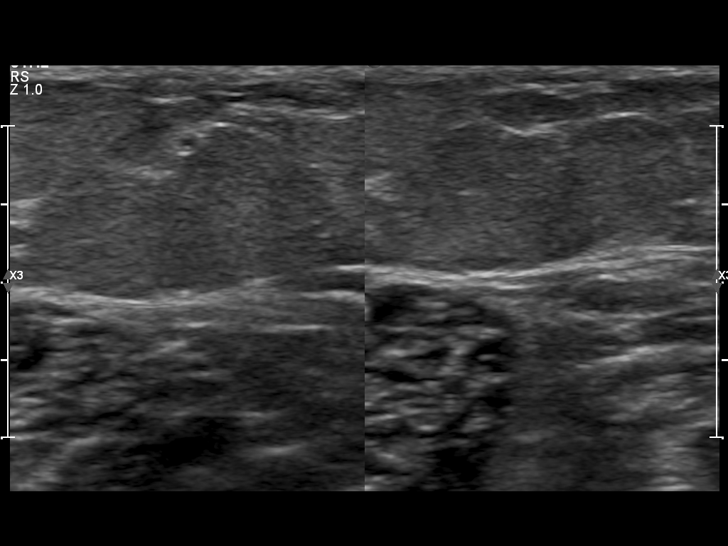
[im 57/66]
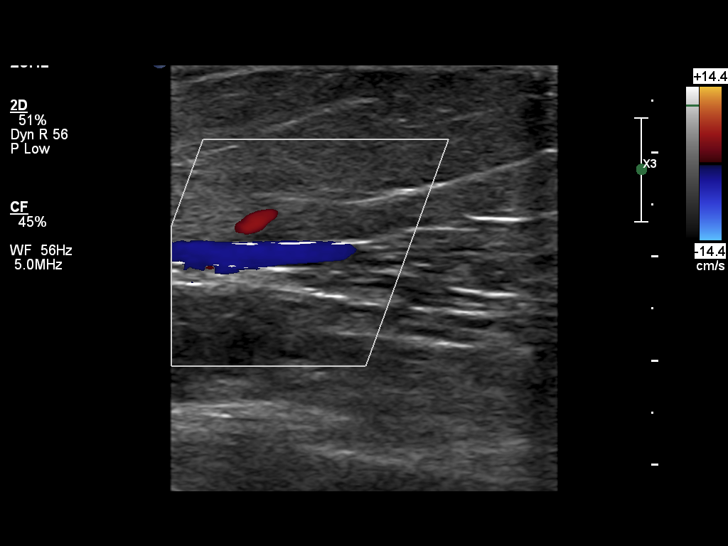
[im 61/66]
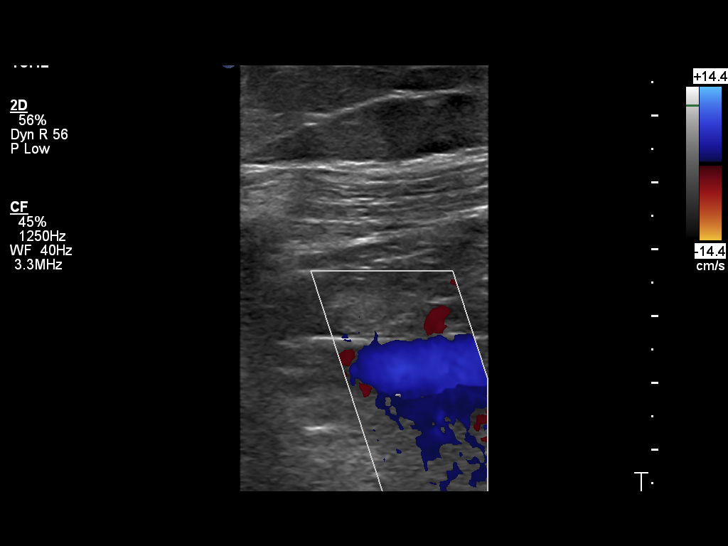
[im 66/66]
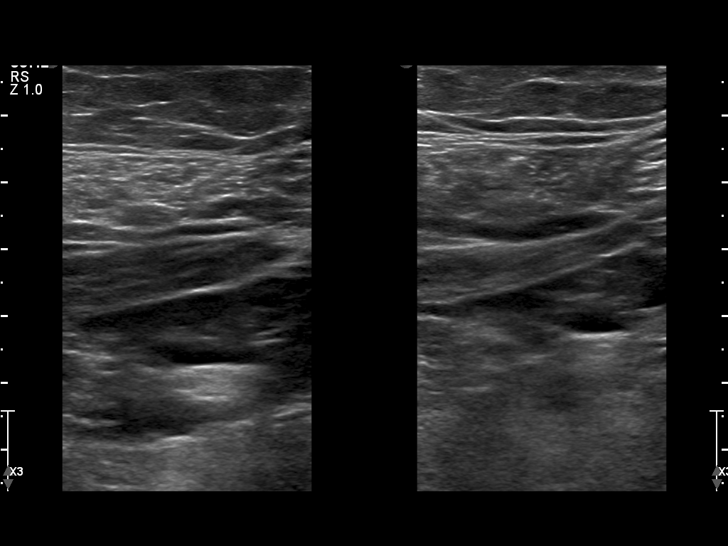

[14 of 16 positions shown; findings below may reference images not displayed]

EXAM
Left upper extremity ultrasound.

INDICATION
diffuse swelling pain
jl

TECHNIQUE
High-resolution grayscale, color Doppler, and duplex interrogation of the left upper extremity
veins.

COMPARISONS
No prior studies are available for comparison.

FINDINGS
Normal flow, compressibility, respiratory variation, and augmentation in the left axillary,
cephalic, brachial, and basilic veins with flow also documented in the radial, ulnar, and subclavian
veins.

IMPRESSION
No evidence of venous thrombosis in the left upper extremity.

Tech Notes:

jl

## 2019-03-09 MED ORDER — DICYCLOMINE 20 MG PO TAB
20 mg | ORAL_TABLET | ORAL | 3 refills | Status: DC | PRN
Start: 2019-03-09 — End: 2019-07-12

## 2019-03-10 ENCOUNTER — Encounter: Admit: 2019-03-10 | Discharge: 2019-03-10 | Payer: BC Managed Care – PPO

## 2019-03-10 DIAGNOSIS — K50018 Crohn's disease of small intestine with other complication: Secondary | ICD-10-CM

## 2019-03-10 NOTE — Telephone Encounter
Per GI provider, Tina Snyder has been unable to schedule her next Entyvio infusion.     Emailed infusion center and they are working on the authorization. The current auth expires prior to the date of her next dose. They will submit a new auth request with Dr. Traci Sermon. Buckle's note from yesterday, 11/17.     Called [facility] and relayed the above. Advised that she contact the clinic if she has not heard from infusion center scheduling in a few weeks. Also advised that she call the clinic and asked to be put on a cancellation list at the infusion center should the next available infusion date be significantly after 1/1.     Patient verbalized understanding and had no further questions or concerns.     Dorothe Pea, PHARMD

## 2019-03-11 ENCOUNTER — Encounter: Admit: 2019-03-11 | Discharge: 2019-03-11 | Payer: BC Managed Care – PPO

## 2019-03-11 DIAGNOSIS — K50018 Crohn's disease of small intestine with other complication: Secondary | ICD-10-CM

## 2019-03-12 MED ORDER — BUDESONIDE 3 MG PO CECX
ORAL_CAPSULE | Freq: Every morning | ORAL | 0 refills | 30.00000 days | Status: DC
Start: 2019-03-12 — End: 2019-06-11

## 2019-03-17 ENCOUNTER — Encounter: Admit: 2019-03-17 | Discharge: 2019-03-17 | Payer: BC Managed Care – PPO

## 2019-03-17 ENCOUNTER — Ambulatory Visit: Admit: 2019-03-17 | Discharge: 2019-03-17 | Payer: BC Managed Care – PPO

## 2019-03-17 DIAGNOSIS — I8290 Acute embolism and thrombosis of unspecified vein: Secondary | ICD-10-CM

## 2019-03-17 DIAGNOSIS — G40909 Epilepsy, unspecified, not intractable, without status epilepticus: Secondary | ICD-10-CM

## 2019-03-17 DIAGNOSIS — J302 Other seasonal allergic rhinitis: Secondary | ICD-10-CM

## 2019-03-17 DIAGNOSIS — H9201 Otalgia, right ear: Secondary | ICD-10-CM

## 2019-03-17 DIAGNOSIS — R42 Dizziness and giddiness: Secondary | ICD-10-CM

## 2019-03-17 DIAGNOSIS — K5090 Crohn's disease, unspecified, without complications: Secondary | ICD-10-CM

## 2019-03-17 DIAGNOSIS — F324 Major depressive disorder, single episode, in partial remission: Secondary | ICD-10-CM

## 2019-03-17 DIAGNOSIS — D4959 Neoplasm of unspecified behavior of other genitourinary organ: Secondary | ICD-10-CM

## 2019-03-17 DIAGNOSIS — G510 Bell's palsy: Secondary | ICD-10-CM

## 2019-03-17 DIAGNOSIS — IMO0002 Ulcer: Secondary | ICD-10-CM

## 2019-03-17 DIAGNOSIS — M359 Systemic involvement of connective tissue, unspecified: Secondary | ICD-10-CM

## 2019-03-17 DIAGNOSIS — E139 Other specified diabetes mellitus without complications: Secondary | ICD-10-CM

## 2019-03-17 DIAGNOSIS — E119 Type 2 diabetes mellitus without complications: Secondary | ICD-10-CM

## 2019-03-17 DIAGNOSIS — K639 Disease of intestine, unspecified: Secondary | ICD-10-CM

## 2019-03-17 DIAGNOSIS — M797 Fibromyalgia: Secondary | ICD-10-CM

## 2019-03-17 DIAGNOSIS — I10 Essential (primary) hypertension: Secondary | ICD-10-CM

## 2019-03-17 DIAGNOSIS — E782 Mixed hyperlipidemia: Secondary | ICD-10-CM

## 2019-03-17 DIAGNOSIS — D699 Hemorrhagic condition, unspecified: Secondary | ICD-10-CM

## 2019-03-17 DIAGNOSIS — G43909 Migraine, unspecified, not intractable, without status migrainosus: Secondary | ICD-10-CM

## 2019-03-17 DIAGNOSIS — K7581 Nonalcoholic steatohepatitis (NASH): Secondary | ICD-10-CM

## 2019-03-17 DIAGNOSIS — K219 Gastro-esophageal reflux disease without esophagitis: Secondary | ICD-10-CM

## 2019-03-17 DIAGNOSIS — R2990 Unspecified symptoms and signs involving the nervous system: Secondary | ICD-10-CM

## 2019-03-17 MED ORDER — ATORVASTATIN 40 MG PO TAB
ORAL_TABLET | Freq: Every day | 3 refills | Status: DC
Start: 2019-03-17 — End: 2019-03-17

## 2019-03-17 MED ORDER — ATORVASTATIN 40 MG PO TAB
40 mg | ORAL_TABLET | Freq: Every day | ORAL | 3 refills | Status: AC
Start: 2019-03-17 — End: ?

## 2019-03-17 MED ORDER — BUPROPION XL 300 MG PO TB24
300 mg | ORAL_TABLET | Freq: Every morning | ORAL | 3 refills | Status: AC
Start: 2019-03-17 — End: ?

## 2019-03-17 NOTE — Progress Notes
ER f/u, lumps on right side of neck/shoulder.    Increased depression recently      Answers for HPI/ROS submitted by the patient on 03/11/2019   Patient Reported Other  What medical problem brings you in to see the provider?: Crohn's, diabetes, bells palsy, fibromyalgia, blood pressure  Your symptom is...: recurrent  When did they start?: more than 1 month ago  How often do you feel this symptom?: daily  How has the symptom changed?: gradually worsening  On a scale of 0 to 10 (10 being the worst), how strong is your pain?: 6/10  How severe is your pain?: moderate  Abdominal pain: Yes  No appetite: No  Joint Pain: Yes  Change in bowel habit: No  Chest pain: Yes  Chills: Yes  Congestion: Yes  Cough: Yes  Sweating much more than normal: Yes  Fatigue: Yes  Fever: Yes  Headaches: Yes  Joint swelling: Yes  Muscle pain: Yes  Nausea: Yes  Neck pain: Yes  Numbness: Yes  Rash: No  Sore throat: No  Swollen glands: Yes  Urinary symptoms: No  Vertigo: No  Visual change: No  Vomiting: No  Weakness: Yes  Which of the following, if any, make(s) your symptom worse?: coughing, intercourse, standing, stress, swallowing  Which of the following treatments have you tried?: acetaminophen, heat, ice, NSAIDs, oral narcotics, rest, walking  If you've tried a treatment for this problem, how much relief did you experience?: no relief    Can she have flu shot?

## 2019-03-17 NOTE — Progress Notes
Tina Snyder is a 53 y.o. female.    INTERVAL hx    She is here for follow up of depression. She feels tired of being sad, down, irritable.  Long prior history of depression and past hospitalization. Does not feel that severe. No SI.  Good support.  Sister and pastor have been providing counseling. Significant improvement with changing from prozac to wellbutrin. Even lost some weight.     She is having good recovery from her bell's palsy.  Face is still numb but regaining mobility.  Eye closes.  She has interesting loss of peripheral vision on the left side since onset of sx.  Sparing of the forehead.  Her face continues to be completely numb.     CHRONIC hx  Depression  Duloxetine 40mg  daily, wellbutrin 150mg   Prior: Fluoxetine 10mg  Prozac  H.o hospitalization as a teenage for SI.  She had a nervous breakdown in 2004 and was hospitalized for 3 days.      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 She was briefly on coumadin but stopped in setting of recurrent bleeding. She has a history of subclavian thrombosis. Recent CT imaging showed a lot of chest wall collaterals.  Pt not aware.  Not on any therapy.  [place] of her left arm showed no DVT. She had a negative VQ scan for PE. She had a CT chest 2016 Development of anterior superior mediastinal vascular collaterals secondary to marked narrowing or occlusion of the left brachiocephalic vein. 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  She had a CTA chest in 2019 with Dr. Hollie Beach and left brachiocephalic vein atresia noted with prominent collaterals. Dr. Gaspar Bidding felt she may need other systemic evaluation to help determine the cause of her swelling.  Her anti CCP and RF were negative in 2020.    Peripheral edema: Progressive edema in her bilateral arms, underarms and chest wall and additionally in her feet. Started in LUE predominantly. Noted in RUE after a prolonged hospital stay in 2019 where she lay on her right side.  She has been on lasix since 2018.  She has a history of subclavian thrombus with chest wall collaterals in addition to intermittent steroids.  Hands are getting thicker, had to change ring size.  Ruddy skin complexion is new.     History of Iron Deficiency Anemia  Resolved with IV iron.    History of Syncope:  She has seen cardiology. Diltiazem held due to orthostatic concerns. She had a TTE on 07/02/2017 which showed EF 70%.  Carotid u/s was unremarkable. She had an EEG on 06/05/2017 which was fine.   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 Chronic NSAID use considered.     Crohns  Buckles, GI  Budesonide 3mg   Prior: mtx (fracture), entecort    MAI:   [place]s, Pulmonary.   She has been on three times weekly azithromycin in the past.     Diabetes Management:  Current regimen:Novolog/glargine changing to U500, not yet  Endocrine:  Yes We have been avoiding SGLT2/GLP-1 in this patient due to recurrent yeast infections as well as Crohn's disease.  Complications:  Nephropathy, Gastropathy/Delayed gastric emptying, CAD, PVD  Diet adherent: most of the time  Medication adherent: all of the time  Patient is consistent with home glucose monitoring: Yes  The patient has not had hypoglycemic reactions  Microalbumin tested  in last 12 months?  No   Eye exam within the last 12 months? No  Comprehensive Foot exam within the last 12 months? No  Pneumonia shot current? No  The patient is taking a daily aspirin:No  The patient is taking an ACE inhibitor or an ARB:No   The patient is taking a statin:Yes   Poc Hemoglobin A1C   Date Value Ref Range Status   03/27/2018 9.2 % Final   11/03/2017 7.6 % Final   06/20/2017 7.8 % Final     Lab Results   Component Value Date/Time HGBA1C 9.1 (H) 11/16/2018 11:13 AM    HGBA1C 9.3 (H) 10/24/2015 03:50 AM    HGBA1C 9.2 (H) 10/22/2015 04:11 AM    A1C 9.2 03/27/2018 09:47 AM    CHOL 135 10/30/2018 11:39 AM    TRIG 272 (H) 10/30/2018 11:39 AM    HDL 35 (L) 10/30/2018 11:39 AM    LDL 75 10/30/2018 11:39 AM    VLDL 54 10/30/2018 11:39 AM    NONHDLCHOL 100 10/30/2018 11:39 AM    CR 1.47 (H) 03/02/2019 02:12 PM    MCALBR 7.1 03/27/2018 11:11 AM    Imp: Diabetes unknown     Hypertension Management:    Current regimen: bumex 1mg , hydralazine 25mg  three times a day, bystolic 10mg  daily, diltiazem 240mg  daily, spironolactone  CVD Hx: none  Smoker: No  Outside blood pressures being performed: Yes  BP Readings from Last 3 Encounters:   03/17/19 115/78   03/02/19 (!) 150/52   02/26/19 121/63     Imp: Hypertension controlled     Fibromyalgia  Duloxetine 40mg  daily, flexeril prn    Ankle Fracture  Oxycodone prn    Sleep Apnea  CPAP  Sleep study 10/03/2017    Social:  Married with grown kids  Grandmother of 1   On disability  History of chronic opioids in the 1990s for fibromyalgia       Review of Systems   Constitutional: Negative for activity change and chills.   HENT: Negative for sore throat.    Eyes: Negative for visual disturbance.   Respiratory: Negative for cough, chest tightness, shortness of breath and wheezing.    Cardiovascular: Negative for chest pain and leg swelling.   Gastrointestinal: Negative for abdominal pain and blood in stool.   Genitourinary: Negative for difficulty urinating and urgency.   Musculoskeletal: Negative for arthralgias and myalgias.   Skin: Negative for color change and rash.   Neurological: Positive for weakness and numbness. Negative for dizziness, speech difficulty and light-headedness. Psychiatric/Behavioral: Positive for decreased concentration and dysphoric mood. Negative for agitation, behavioral problems, confusion, hallucinations, self-injury, sleep disturbance and suicidal ideas. The patient is nervous/anxious. The patient is not hyperactive.          Objective:         ? AMITR/GABAPEN/EMU OIL 07/24/08% CREAM (COMPOUND) Apply small amount to affected area   ? atorvastatin (LIPITOR) 40 mg tablet TAKE 1 TABLET BY MOUTH EVERY DAY   ? blood sugar diagnostic (ONETOUCH VERIO) test strip Use 1 Strip as directed before meals and at bedtime.   ? Blood-Glucose Sensor (DEXCOM G6 SENSOR) devi Use 1 each as directed as directed. Change every 10 days   ? budesonide (ENTOCORT EC) 3 mg capsule TAKE 2 CAPSULES BY MOUTH EVERY MORNING   ? bumetanide (BUMEX) 1 mg tablet Take one tablet by mouth daily.   ? buPROPion SR (WELLBUTRIN-SR) 150 mg tablet Take one tablet by mouth daily.   ?  cyclobenzaprine (FLEXERIL) 10 mg tablet Take 10 mg by mouth daily as needed. .   ? DEXCOM G6 RECEIVER misc Use 1 each as directed daily.   ? DEXCOM G6 TRANSMITTER devi Use 1 each as directed as directed. Every 90 days   ? dexlansoprazole (DEXILANT) 60 mg capsule Take one capsule by mouth daily. Indications: gastroesophageal reflux disease   ? dicyclomine (BENTYL) 20 mg tablet Take one tablet by mouth every 6 hours as needed.   ? diphenoxylate/atropine (LOMOTIL) 2.5/0.025 mg tablet Take one tablet by mouth four times daily as needed for Diarrhea.   ? duloxetine DR (CYMBALTA) 60 mg capsule Take 60 mg by mouth daily with breakfast.   ? HUMULIN R U-500 (CONC) KWIKPEN 500 unit/mL (3 mL) injection PEN Take 310 units before breakfast, 250 units before lunch, and 125 units before dinner. Take about 20 minutes prior to the meal   ? HYDROcodone/acetaminophen (NORCO) 5/325 mg tablet Take one tablet by mouth every 4 hours as needed for Pain ? insulin pen needles (disposable) (B-D NANO) 32 gauge x 5/32 pen needle Use one each as directed three times daily before meals. Use with insulin injections.   ? lancets (ONE TOUCH DELICA) MISC Use 1 Each as directed before meals and at bedtime. Diag: Diabetes mellitus (E.11)   ? nebivolol (BYSTOLIC) 10 mg tablet Take one tablet by mouth daily.   ? ondansetron (ZOFRAN ODT) 4 mg rapid dissolve tablet Dissolve one tablet by mouth every 8 hours as needed for Nausea or Vomiting. Place on tongue to disolve.   ? spironolactone (ALDACTONE) 50 mg tablet Take 50 mg by mouth daily.   ? temazepam (RESTORIL) 15 mg capsule Take 15 mg by mouth at bedtime as needed.   ? traMADoL (ULTRAM) 50 mg tablet TAKE 1 TABLET BY MOUTH EVERY 6 HOURS AS NEEDED FOR PAIN   ? vedolizumab (ENTYVIO IV) Administer  through vein.     Vitals:    03/17/19 0958   BP: 115/78   Pulse: 76   Resp: 16   Weight: 92.1 kg (203 lb)   Height: 162.6 cm (64.02)   PainSc: Four     Vitals:    03/17/19 0958   BP: 115/78   Pulse: 76   Resp: 16   Weight: 92.1 kg (203 lb)   Height: 162.6 cm (64.02)   PainSc: Four       Body mass index is 34.82 kg/m?Marland Kitchen     Physical Exam  Vitals signs and nursing note reviewed.   Constitutional:       Appearance: Normal appearance. She is well-developed.   HENT:      Head: Normocephalic and atraumatic.   Neurological:      Mental Status: She is alert.      Comments: She has some loss of peripheral vision of the left side, she has paralysis of the lower part of her face sparing the forehead.    Psychiatric:         Mood and Affect: Mood normal.         Behavior: Behavior normal.         Thought Content: Thought content normal.         Labwork reviewed:  Lab Results   Component Value Date/Time    HGBA1C 9.1 (H) 11/16/2018 11:13 AM    HGBA1C 9.3 (H) 10/24/2015 03:50 AM    HGBA1C 9.2 (H) 10/22/2015 04:11 AM    A1C 9.2 03/27/2018 09:47 AM    MCALBR 7.1 03/27/2018  11:11 AM    TSH 1.85 10/30/2018 11:39 AM    FREET4R 0.7 09/25/2007 12:30 PM CHOL 135 10/30/2018 11:39 AM    TRIG 272 (H) 10/30/2018 11:39 AM    HDL 35 (L) 10/30/2018 11:39 AM    LDL 75 10/30/2018 11:39 AM    NA 138 03/02/2019 02:12 PM    K 4.5 03/02/2019 02:12 PM    CL 101 03/02/2019 02:12 PM    CO2 26 03/02/2019 02:12 PM    GAP 11 03/02/2019 02:12 PM    BUN 20 03/02/2019 02:12 PM    CR 1.47 (H) 03/02/2019 02:12 PM    GLU 191 (H) 03/02/2019 02:12 PM    CA 9.9 03/02/2019 02:12 PM    PO4 5.2 (H) 09/06/2012 04:17 AM    ALBUMIN 4.2 03/02/2019 02:12 PM    TOTPROT 7.1 03/02/2019 02:12 PM    ALKPHOS 222 (H) 03/02/2019 02:12 PM    AST 19 03/02/2019 02:12 PM    ALT 23 03/02/2019 02:12 PM    TOTBILI 0.3 03/02/2019 02:12 PM    GFR 37 (L) 03/02/2019 02:12 PM    GFRAA 45 (L) 03/02/2019 02:12 PM            Assessment and Plan:    Tina Snyder was seen today    Diagnoses and all orders for this visit:    Bell's Palsy  Left visual field deficit  Left facial numbness  Bell's palsy, while sparing the forehead today - based on initial documented exam included the forehead implying peripheral involvement on the ipsilateral side should not include sensory deficits (CN V) or visual fields (CN2).  She has deficits confirmed on exam today.    > MRI to r/o MS or other process     Depression - Major recurrent   Worsening. Improvement with wellbutrin but overall not controlled.   > increase wellbutrin to 300  > continue duloxetine  > continue therapy    Mastoid Effusion  She has continued pain on the right side with mastoid and middle ear effusion.  While chronic mastoiditis could cause Bell's palsy this is on the opposite side of the lesion.   > ENT referral    ----------------------------------  Crohn's disease of small intestine with other complication [facility]): Chronic and active.   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 Upper >> lower extremity. 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 Hollie Beach does not feel this is responsible for her edema.  She has had significant improvement with lymphedema therapy and will continue to do so.  Interstingly she always has swollen lymph nodes during these episodes and I am considering a lymphoscintigraphy strongly.  Will start with an ultrasound of her axilla.   We discussed [facility].   Her rheumatologic wu was negative.   > Lymphoscintigraphy  consider  > axillary ultrasound  > [facility]  > continue bumex at reduced prior dose  > continue OT / restart    AKI:  Likely in setting of increased bumex. She returned to her prior dose. Additionally blood sugars have been elevated.   > repeat BMP today    Transaminitis: Resolved    Type 2 diabetes mellitus without complication, without long-term current use of insulin (HCC): Follows in endocrine here. Variable control. Recently highs and los.   > hemoglobin a1c  > continue control  efforts with endocrine  > LDL at goal 70  > BP at goal      Essential hypertension: Better control. Following closely w/ cardiology.    Mycobacterium avium-intracellulare complex (HCC)  > Inactive. Following w/ [facility] currently     OSA: Tolerating new CPAP                                 There are no Patient Instructions on file for this visit.    No follow-ups on file.

## 2019-03-19 ENCOUNTER — Encounter: Admit: 2019-03-19 | Discharge: 2019-03-19 | Payer: BC Managed Care – PPO

## 2019-03-26 ENCOUNTER — Encounter: Admit: 2019-03-26 | Discharge: 2019-03-26 | Payer: BC Managed Care – PPO

## 2019-03-26 ENCOUNTER — Ambulatory Visit: Admit: 2019-03-26 | Discharge: 2019-03-27 | Payer: BC Managed Care – PPO

## 2019-03-26 DIAGNOSIS — D699 Hemorrhagic condition, unspecified: Secondary | ICD-10-CM

## 2019-03-26 DIAGNOSIS — IMO0002 Ulcer: Secondary | ICD-10-CM

## 2019-03-26 DIAGNOSIS — I8290 Acute embolism and thrombosis of unspecified vein: Secondary | ICD-10-CM

## 2019-03-26 DIAGNOSIS — J302 Other seasonal allergic rhinitis: Secondary | ICD-10-CM

## 2019-03-26 DIAGNOSIS — I10 Essential (primary) hypertension: Secondary | ICD-10-CM

## 2019-03-26 DIAGNOSIS — K639 Disease of intestine, unspecified: Secondary | ICD-10-CM

## 2019-03-26 DIAGNOSIS — G43909 Migraine, unspecified, not intractable, without status migrainosus: Secondary | ICD-10-CM

## 2019-03-26 DIAGNOSIS — K5090 Crohn's disease, unspecified, without complications: Secondary | ICD-10-CM

## 2019-03-26 DIAGNOSIS — E119 Type 2 diabetes mellitus without complications: Secondary | ICD-10-CM

## 2019-03-26 DIAGNOSIS — K7581 Nonalcoholic steatohepatitis (NASH): Secondary | ICD-10-CM

## 2019-03-26 DIAGNOSIS — K219 Gastro-esophageal reflux disease without esophagitis: Secondary | ICD-10-CM

## 2019-03-26 DIAGNOSIS — G40909 Epilepsy, unspecified, not intractable, without status epilepticus: Secondary | ICD-10-CM

## 2019-03-26 DIAGNOSIS — D4959 Neoplasm of unspecified behavior of other genitourinary organ: Secondary | ICD-10-CM

## 2019-03-26 DIAGNOSIS — M797 Fibromyalgia: Secondary | ICD-10-CM

## 2019-03-26 DIAGNOSIS — R42 Dizziness and giddiness: Secondary | ICD-10-CM

## 2019-03-26 DIAGNOSIS — E139 Other specified diabetes mellitus without complications: Secondary | ICD-10-CM

## 2019-03-26 DIAGNOSIS — M359 Systemic involvement of connective tissue, unspecified: Secondary | ICD-10-CM

## 2019-03-26 NOTE — Patient Instructions
HgbA1c 7.5 today. It was 9.1 on 11/16/18, excellent improvement    Insulin U 500 Current dose 290 /220 /120 before breakfast/lunch/dinner , please reduce evening dose to 110 and there're room to reduce on other dose by 10 units if readings closed to 70 during the day.

## 2019-03-27 DIAGNOSIS — E1165 Type 2 diabetes mellitus with hyperglycemia: Principal | ICD-10-CM

## 2019-03-27 DIAGNOSIS — Z794 Long term (current) use of insulin: Secondary | ICD-10-CM

## 2019-03-30 ENCOUNTER — Ambulatory Visit: Admit: 2019-03-30 | Discharge: 2019-03-30 | Payer: BC Managed Care – PPO

## 2019-03-30 ENCOUNTER — Encounter: Admit: 2019-03-30 | Discharge: 2019-03-30 | Payer: BC Managed Care – PPO

## 2019-03-30 DIAGNOSIS — H6591 Unspecified nonsuppurative otitis media, right ear: Secondary | ICD-10-CM

## 2019-03-30 DIAGNOSIS — H6983 Other specified disorders of Eustachian tube, bilateral: Secondary | ICD-10-CM

## 2019-03-30 DIAGNOSIS — H90A31 Mixed conductive and sensorineural hearing loss, unilateral, right ear with restricted hearing on the contralateral side: Secondary | ICD-10-CM

## 2019-03-30 NOTE — Progress Notes
Date of Service: 03/30/2019    Subjective:             Tina Snyder is a 53 y.o. female.    History of Present Illness  FU.      Nov - Bells WU. Mastoiditis.     Sinus issues.  May began.    Noises in ear right side.      Allergy   On flonase. No antihistamine.    Sinus..  Not irrigating but has in past. Tender sinus.          Review of Systems   Constitutional: Positive for chills, diaphoresis and fatigue.   HENT: Positive for congestion, ear pain, hearing loss, postnasal drip, sinus pressure and tinnitus.    Eyes: Positive for photophobia.   Respiratory: Positive for apnea.    Cardiovascular: Positive for leg swelling.   Gastrointestinal: Positive for abdominal pain, blood in stool, constipation, diarrhea and nausea.   Genitourinary: Positive for frequency and urgency.   Musculoskeletal: Positive for arthralgias, back pain, joint swelling and myalgias.   Neurological: Positive for dizziness, light-headedness, numbness and headaches.   Hematological: Bruises/bleeds easily.   Psychiatric/Behavioral: Positive for agitation, dysphoric mood and sleep disturbance. The patient is nervous/anxious.          Objective:         ? AMITR/GABAPEN/EMU OIL 07/24/08% CREAM (COMPOUND) Apply small amount to affected area   ? atorvastatin (LIPITOR) 40 mg tablet Take one tablet by mouth daily.   ? blood sugar diagnostic (ONETOUCH VERIO) test strip Use 1 Strip as directed before meals and at bedtime.   ? Blood-Glucose Sensor (DEXCOM G6 SENSOR) devi Use 1 each as directed as directed. Change every 10 days   ? budesonide (ENTOCORT EC) 3 mg capsule TAKE 2 CAPSULES BY MOUTH EVERY MORNING   ? bumetanide (BUMEX) 1 mg tablet Take one tablet by mouth daily.   ? buPROPion XL (WELLBUTRIN XL) 300 mg tablet Take one tablet by mouth every morning. Do not crush or chew.   ? cyclobenzaprine (FLEXERIL) 10 mg tablet Take 10 mg by mouth daily as needed. .   ? DEXCOM G6 RECEIVER misc Use 1 each as directed daily. ? DEXCOM G6 TRANSMITTER devi Use 1 each as directed as directed. Every 90 days   ? dexlansoprazole (DEXILANT) 60 mg capsule Take one capsule by mouth daily. Indications: gastroesophageal reflux disease   ? dicyclomine (BENTYL) 20 mg tablet Take one tablet by mouth every 6 hours as needed.   ? diphenoxylate/atropine (LOMOTIL) 2.5/0.025 mg tablet Take one tablet by mouth four times daily as needed for Diarrhea.   ? duloxetine DR (CYMBALTA) 60 mg capsule Take 60 mg by mouth daily with breakfast.   ? HUMULIN R U-500 (CONC) KWIKPEN 500 unit/mL (3 mL) injection PEN Take 310 units before breakfast, 250 units before lunch, and 125 units before dinner. Take about 20 minutes prior to the meal   ? HYDROcodone/acetaminophen (NORCO) 5/325 mg tablet Take one tablet by mouth every 4 hours as needed for Pain   ? insulin pen needles (disposable) (B-D NANO) 32 gauge x 5/32 pen needle Use one each as directed three times daily before meals. Use with insulin injections.   ? lancets (ONE TOUCH DELICA) MISC Use 1 Each as directed before meals and at bedtime. Diag: Diabetes mellitus (E.11)   ? nebivolol (BYSTOLIC) 10 mg tablet Take one tablet by mouth daily.   ? ondansetron (ZOFRAN ODT) 4 mg rapid dissolve tablet Dissolve one tablet  by mouth every 8 hours as needed for Nausea or Vomiting. Place on tongue to disolve.   ? spironolactone (ALDACTONE) 50 mg tablet Take 50 mg by mouth daily.   ? temazepam (RESTORIL) 15 mg capsule Take 15 mg by mouth at bedtime as needed.   ? traMADoL (ULTRAM) 50 mg tablet TAKE 1 TABLET BY MOUTH EVERY 6 HOURS AS NEEDED FOR PAIN   ? vedolizumab (ENTYVIO IV) Administer  through vein.     Vitals:    03/30/19 1135   BP: 128/84   BP Source: Arm, Left Upper   Patient Position: Sitting   Pulse: 82   Temp: 37.2 ?C (98.9 ?F)   Weight: 99.8 kg (220 lb)   Height: 162.6 cm (64)   PainSc: Zero     Body mass index is 37.76 kg/m?Marland Kitchen     Physical Exam On microscopic examination of the both ear she has serous otitis media on right with normal tympanic membrane in both ears.   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  Left is normal with slight neg pressure.    Assessment and Plan:  My impression is eustachian tube dysfunction and serous otitis media due to sinus and allegy issues.  She will need to start irrigating and clean up the sinus first and additionally start nasal steroids and antihistamine.  She will try to valsalva as much as possible as well.  FU with Nori Riis for sinus care.

## 2019-04-06 ENCOUNTER — Encounter: Admit: 2019-04-06 | Discharge: 2019-04-06 | Payer: BC Managed Care – PPO

## 2019-04-06 ENCOUNTER — Ambulatory Visit: Admit: 2019-04-06 | Discharge: 2019-04-06 | Payer: BC Managed Care – PPO

## 2019-04-06 DIAGNOSIS — J302 Other seasonal allergic rhinitis: Secondary | ICD-10-CM

## 2019-04-06 DIAGNOSIS — K5090 Crohn's disease, unspecified, without complications: Secondary | ICD-10-CM

## 2019-04-06 DIAGNOSIS — I10 Essential (primary) hypertension: Secondary | ICD-10-CM

## 2019-04-06 DIAGNOSIS — M797 Fibromyalgia: Secondary | ICD-10-CM

## 2019-04-06 DIAGNOSIS — E139 Other specified diabetes mellitus without complications: Secondary | ICD-10-CM

## 2019-04-06 DIAGNOSIS — E119 Type 2 diabetes mellitus without complications: Secondary | ICD-10-CM

## 2019-04-06 DIAGNOSIS — M359 Systemic involvement of connective tissue, unspecified: Secondary | ICD-10-CM

## 2019-04-06 DIAGNOSIS — G43909 Migraine, unspecified, not intractable, without status migrainosus: Secondary | ICD-10-CM

## 2019-04-06 DIAGNOSIS — K7581 Nonalcoholic steatohepatitis (NASH): Secondary | ICD-10-CM

## 2019-04-06 DIAGNOSIS — D699 Hemorrhagic condition, unspecified: Secondary | ICD-10-CM

## 2019-04-06 DIAGNOSIS — G40909 Epilepsy, unspecified, not intractable, without status epilepticus: Secondary | ICD-10-CM

## 2019-04-06 DIAGNOSIS — K639 Disease of intestine, unspecified: Secondary | ICD-10-CM

## 2019-04-06 DIAGNOSIS — J320 Chronic maxillary sinusitis: Secondary | ICD-10-CM

## 2019-04-06 DIAGNOSIS — D4959 Neoplasm of unspecified behavior of other genitourinary organ: Secondary | ICD-10-CM

## 2019-04-06 DIAGNOSIS — IMO0002 Ulcer: Secondary | ICD-10-CM

## 2019-04-06 DIAGNOSIS — I8290 Acute embolism and thrombosis of unspecified vein: Secondary | ICD-10-CM

## 2019-04-06 DIAGNOSIS — R42 Dizziness and giddiness: Secondary | ICD-10-CM

## 2019-04-06 DIAGNOSIS — K219 Gastro-esophageal reflux disease without esophagitis: Secondary | ICD-10-CM

## 2019-04-06 MED ORDER — AMOXICILLIN-POT CLAVULANATE 875-125 MG PO TAB
ORAL_TABLET | Freq: Two times a day (BID) | ORAL | 0 refills | 7.00000 days | Status: DC
Start: 2019-04-06 — End: 2019-06-02

## 2019-04-07 ENCOUNTER — Encounter: Admit: 2019-04-07 | Discharge: 2019-04-07 | Payer: BC Managed Care – PPO

## 2019-04-07 DIAGNOSIS — K639 Disease of intestine, unspecified: Secondary | ICD-10-CM

## 2019-04-07 DIAGNOSIS — IMO0002 Ulcer: Secondary | ICD-10-CM

## 2019-04-07 DIAGNOSIS — K5090 Crohn's disease, unspecified, without complications: Secondary | ICD-10-CM

## 2019-04-07 DIAGNOSIS — E119 Type 2 diabetes mellitus without complications: Secondary | ICD-10-CM

## 2019-04-07 DIAGNOSIS — D699 Hemorrhagic condition, unspecified: Secondary | ICD-10-CM

## 2019-04-07 DIAGNOSIS — D4959 Neoplasm of unspecified behavior of other genitourinary organ: Secondary | ICD-10-CM

## 2019-04-07 DIAGNOSIS — K219 Gastro-esophageal reflux disease without esophagitis: Secondary | ICD-10-CM

## 2019-04-07 DIAGNOSIS — K7581 Nonalcoholic steatohepatitis (NASH): Secondary | ICD-10-CM

## 2019-04-07 DIAGNOSIS — M359 Systemic involvement of connective tissue, unspecified: Secondary | ICD-10-CM

## 2019-04-07 DIAGNOSIS — I8290 Acute embolism and thrombosis of unspecified vein: Secondary | ICD-10-CM

## 2019-04-07 DIAGNOSIS — R42 Dizziness and giddiness: Secondary | ICD-10-CM

## 2019-04-07 DIAGNOSIS — G43909 Migraine, unspecified, not intractable, without status migrainosus: Secondary | ICD-10-CM

## 2019-04-07 DIAGNOSIS — G40909 Epilepsy, unspecified, not intractable, without status epilepticus: Secondary | ICD-10-CM

## 2019-04-07 DIAGNOSIS — J302 Other seasonal allergic rhinitis: Secondary | ICD-10-CM

## 2019-04-07 DIAGNOSIS — M797 Fibromyalgia: Secondary | ICD-10-CM

## 2019-04-07 DIAGNOSIS — E139 Other specified diabetes mellitus without complications: Secondary | ICD-10-CM

## 2019-04-07 DIAGNOSIS — I10 Essential (primary) hypertension: Secondary | ICD-10-CM

## 2019-04-07 MED ORDER — DEXLANSOPRAZOLE 60 MG PO CPDB
60 mg | ORAL_CAPSULE | Freq: Every day | ORAL | 5 refills | 30.00000 days | Status: DC
Start: 2019-04-07 — End: 2019-09-29

## 2019-04-08 ENCOUNTER — Encounter: Admit: 2019-04-08 | Discharge: 2019-04-08 | Payer: BC Managed Care – PPO

## 2019-04-08 MED ORDER — EPINEPHRINE 0.3 MG/0.3 ML IJ ATIN
0.3 mg | Freq: Once | INTRAMUSCULAR | 1 refills | 30.00000 days | Status: AC
Start: 2019-04-08 — End: ?

## 2019-04-14 ENCOUNTER — Encounter: Admit: 2019-04-14 | Discharge: 2019-04-14 | Payer: BC Managed Care – PPO

## 2019-04-14 ENCOUNTER — Ambulatory Visit: Admit: 2019-04-14 | Discharge: 2019-04-14 | Payer: BC Managed Care – PPO

## 2019-04-14 DIAGNOSIS — J320 Chronic maxillary sinusitis: Secondary | ICD-10-CM

## 2019-04-19 ENCOUNTER — Encounter: Admit: 2019-04-19 | Discharge: 2019-04-19 | Payer: BC Managed Care – PPO

## 2019-04-27 ENCOUNTER — Encounter: Admit: 2019-04-27 | Discharge: 2019-04-27 | Payer: MEDICARE

## 2019-04-27 ENCOUNTER — Ambulatory Visit: Admit: 2019-04-27 | Discharge: 2019-04-27 | Payer: MEDICARE

## 2019-04-27 DIAGNOSIS — D699 Hemorrhagic condition, unspecified: Secondary | ICD-10-CM

## 2019-04-27 DIAGNOSIS — I10 Essential (primary) hypertension: Secondary | ICD-10-CM

## 2019-04-27 DIAGNOSIS — J302 Other seasonal allergic rhinitis: Secondary | ICD-10-CM

## 2019-04-27 DIAGNOSIS — E119 Type 2 diabetes mellitus without complications: Secondary | ICD-10-CM

## 2019-04-27 DIAGNOSIS — K7581 Nonalcoholic steatohepatitis (NASH): Secondary | ICD-10-CM

## 2019-04-27 DIAGNOSIS — M359 Systemic involvement of connective tissue, unspecified: Secondary | ICD-10-CM

## 2019-04-27 DIAGNOSIS — G40909 Epilepsy, unspecified, not intractable, without status epilepticus: Secondary | ICD-10-CM

## 2019-04-27 DIAGNOSIS — M797 Fibromyalgia: Secondary | ICD-10-CM

## 2019-04-27 DIAGNOSIS — R42 Dizziness and giddiness: Secondary | ICD-10-CM

## 2019-04-27 DIAGNOSIS — I8290 Acute embolism and thrombosis of unspecified vein: Secondary | ICD-10-CM

## 2019-04-27 DIAGNOSIS — K639 Disease of intestine, unspecified: Secondary | ICD-10-CM

## 2019-04-27 DIAGNOSIS — D4959 Neoplasm of unspecified behavior of other genitourinary organ: Secondary | ICD-10-CM

## 2019-04-27 DIAGNOSIS — H6521 Chronic serous otitis media, right ear: Secondary | ICD-10-CM

## 2019-04-27 DIAGNOSIS — K219 Gastro-esophageal reflux disease without esophagitis: Secondary | ICD-10-CM

## 2019-04-27 DIAGNOSIS — G43909 Migraine, unspecified, not intractable, without status migrainosus: Secondary | ICD-10-CM

## 2019-04-27 DIAGNOSIS — IMO0002 Ulcer: Secondary | ICD-10-CM

## 2019-04-27 DIAGNOSIS — E139 Other specified diabetes mellitus without complications: Secondary | ICD-10-CM

## 2019-04-27 DIAGNOSIS — K5090 Crohn's disease, unspecified, without complications: Secondary | ICD-10-CM

## 2019-04-27 DIAGNOSIS — H9011 Conductive hearing loss, unilateral, right ear, with unrestricted hearing on the contralateral side: Secondary | ICD-10-CM

## 2019-04-27 MED ORDER — CIPROFLOXACIN HCL 0.3 % OP DROP
3 [drp] | Freq: Two times a day (BID) | OTIC | 0 refills | 17.00000 days | Status: AC
Start: 2019-04-27 — End: ?

## 2019-04-27 NOTE — Progress Notes
Date of Service: 04/27/2019    Subjective:             Tina Snyder is a 54 y.o. female.    History of Present Illness    Referred by Dr. Jennette Snyder and Tina Snyder for sinus/allergy evaluation and chronic serous otitis media on her right.  This began last May following an acute URI.  She took abx for the infection, but her ear symptoms failed to resolve.   Interval CT sinus was normal but showed persistent right middle ear and mastoid effusion.    She initially presented to Dr. Jennette Snyder 03/25/19 with a 3 month history of R hearing loss, fullness, fluctuating post auricular pressure/discomfort, and tinnitus.  Exam revealed a serous otitis media, mixed hearing loss (largely conductive).  His impression was ETD/SOM due to sinus/allergy issues.  He recommended irrigations and nasal steroid spray as well as antihistamine and follow up today for further recommendations.  She was initially sent to him due to concern for mastoiditis (R) found on imaging work up for Bell's Palsy (L).  The latter is resolving.  She was seen at [facility] ED for this so records are in O2 and reviewed.  ?  She does have allergies, traditionally seasonal.  She has taken zyrtec without issue although this is on her allergy list.  She uses flonase.  She does not feel comfortable using irrigations as she was restricted on this when she was diagnosed with MAI with the recommendation to avoid any water substances like that.  This was > 10 years ago, but she has been told remnants are still in her lungs, and she remains cautious to avoid a flare up.  She is currently not on any chronic antibiotic treatment for this.  ?  She has chronic bilateral maxillary sinus pressure, but does always feel like it is more so on the R.  She has thick post nasal drip she appreciates coming down but doesn't blow much out. Tina Snyder has a past medical history of Autoimmune disease (HCC) (Chron's), Bleeding tendency (HCC), Blood clot in vein (subclavian), Bowel disease, Crohn disease (HCC), Dizziness, DM (diabetes mellitus), secondary-steroid-induced (2012), Fibromyalgia, GERD (gastroesophageal reflux disease), Hypertension, Hypertensive heart disease, Migraines, Seasonal allergic reaction, Seizure disorder (HCC), Steatohepatitis (felt secondary to medications), Type 2 diabetes mellitus without complication, with long-term current use of insulin (HCC) (10/22/2017), Ulcer, and Vaginal tumor. She has a past surgical history that includes breast lumpectomy; appendectomy; cholecystectomy; small bowel resection; hemorrhoidectomy; subtotal colectomy (Ileocolectomy ); Gastric fundoplication; bronchoscopy; Lung surgery (10/22/2010) (lung biopsy); liver biopsy (12/2010); hysterectomy (partial 02/1995, ovaries removed 1999); oophorectomy (1999 ovaries removed); endoscopy (colonoscopy 2014); adenoidectomy (05/1995); Upper gastrointestinal endoscopy (N/A, 06/30/2017) (ESOPHAGOGASTRODUODENOSCOPY WITH SPECIMEN COLLECTION BY BRUSHING/ WASHING performed by Tina Ewing, MD at [facility]); Colonoscopy (N/A, 06/30/2017) (COLONOSCOPY DIAGNOSTIC WITH SPECIMEN COLLECTION BY BRUSHING/ WASHING - FLEXIBLE performed by Tina Ewing, MD at [facility]); Upper gastrointestinal endoscopy (N/A, 06/30/2017) (ESOPHAGOGASTRODUODENOSCOPY WITH BIOPSY - FLEXIBLE performed by Tina Ewing, MD at [facility]); BIOPSY (06/30/2017) (COLONOSCOPY WITH BIOPSY - FLEXIBLE performed by Tina Ewing, MD at [facility]); Upper gastrointestinal endoscopy (N/A, 03/13/2015) (ESOPHAGOGASTRODUODENOSCOPY performed by Tina Aquas, MD at [facility]); Colonoscopy (N/A, 03/13/2015) (COLONOSCOPY performed by Tina Aquas, MD at [facility]); Upper gastrointestinal endoscopy (03/13/2015) (ESOPHAGOGASTRODUODENOSCOPY BIOPSY performed by Tina Aquas, MD at [facility]); Colonoscopy (03/13/2015) (COLONOSCOPY BIOPSY performed by Tina Aquas, MD at [facility]); Colonoscopy (N/A, 11/11/2018) (COLONOSCOPY DIAGNOSTIC WITH SPECIMEN COLLECTION BY BRUSHING/ WASHING - FLEXIBLE performed by Tina Sams, MD at [facility] END[facility]); Upper gastrointestinal endoscopy (N/A, 11/11/2018) (ESOPHAGOGASTRODUODENOSCOPY  WITH SPECIMEN COLLECTION BY BRUSHING/ WASHING performed by Tina Sams, MD at [facility] ENDO); Upper gastrointestinal endoscopy (11/11/2018) (ESOPHAGOGASTRODUODENOSCOPY WITH BIOPSY - FLEXIBLE performed by Tina Sams, MD at [facility] ENDO); Colonoscopy (N/A, 11/12/2018) (COLONOSCOPY DIAGNOSTIC WITH SPECIMEN COLLECTION BY BRUSHING/ WASHING - FLEXIBLE performed by Tina Penton, MD at [facility] ENDO); and BIOPSY (11/12/2018) (COLONOSCOPY WITH BIOPSY - FLEXIBLE performed by Tina Penton, MD at [facility] ENDO).    Tina Snyder's family history includes Asthma in her son; Basal Cell Carcinoma in her mother; Cancer in her paternal grandmother; Celiac Disease in her son; Depression in her father, mother, and paternal grandmother; Heart Failure in her maternal grandfather and paternal grandfather; High Cholesterol in her father and paternal grandmother; Hypertension in her father, maternal grandmother, mother, paternal grandfather, and paternal grandmother; Migraines in her maternal grandfather; Stroke in her maternal grandfather and maternal grandmother.    Social History     Tobacco Use   ? Smoking status: Never Smoker   ? Smokeless tobacco: Never Used   Substance Use Topics   ? Alcohol use: No     Alcohol/week: 0.0 standard drinks       Allergies   Allergen Reactions   ? Bee Venom Protein (Honey Bee) ANAPHYLAXIS   ? Morphine HALLUCINATIONS     States she quits breathing     ? Bee [Bumble Bee] UNKNOWN   ? Ciprofloxacin NAUSEA AND VOMITING   ? Compazine [Prochlorperazine] HIVES and SEE COMMENTS     Tolerates Phenergan   ? Coumadin [Warfarin] SEE COMMENTS     Hemorrhage   ? Flagyl [Metronidazole] HIVES   ? Lovenox [Enoxaparin] SEE COMMENTS     Hemorrhage   ? Reglan [Metoclopramide] SEE COMMENTS     Patient states that she cannot tolerate.   ? Stadol [Butorphanol Tartrate] SEE COMMENTS     Patient states that it made her loopy and drunk.   ? Sulfa (Sulfonamide Antibiotics) HIVES and NAUSEA AND VOMITING   ? Tysabri [Natalizumab] SEE COMMENTS     JC virus positive.  Do not use Tysabri - high risk for PML   ? Walnut UNKNOWN   ? Zyrtec [Cetirizine] UNKNOWN   ? Pecan Nut UNKNOWN         Review of Systems   Constitutional: Negative.    HENT: Positive for hearing loss, postnasal drip and tinnitus.    Eyes: Negative. Respiratory: Negative.    Cardiovascular: Negative.    Gastrointestinal: Negative.    Endocrine: Negative.    Genitourinary: Negative.    Musculoskeletal: Negative.    Skin: Negative.    Allergic/Immunologic: Negative.    Neurological: Negative.    Hematological: Negative.    Psychiatric/Behavioral: Negative.          Objective:         ? AMITR/GABAPEN/EMU OIL 07/24/08% CREAM (COMPOUND) Apply small amount to affected area   ? amoxicillin-potassium clavulanate (AUGMENTIN) 875/125 mg tablet One tab po bid   ? atorvastatin (LIPITOR) 40 mg tablet Take one tablet by mouth daily.   ? blood sugar diagnostic (ONETOUCH VERIO) test strip Use 1 Strip as directed before meals and at bedtime.   ? Blood-Glucose Sensor (DEXCOM G6 SENSOR) devi Use 1 each as directed as directed. Change every 10 days   ? budesonide (ENTOCORT EC) 3 mg capsule TAKE 2 CAPSULES BY MOUTH EVERY MORNING   ? bumetanide (BUMEX) 1 mg tablet Take one tablet by mouth daily.   ? buPROPion XL (WELLBUTRIN XL) 300 mg tablet Take one tablet by  mouth every morning. Do not crush or chew.   ? cyclobenzaprine (FLEXERIL) 10 mg tablet Take 10 mg by mouth daily as needed. .   ? DEXCOM G6 RECEIVER misc Use 1 each as directed daily.   ? DEXCOM G6 TRANSMITTER devi Use 1 each as directed as directed. Every 90 days   ? dexlansoprazole (DEXILANT) 60 mg capsule Take one capsule by mouth daily. Indications: gastroesophageal reflux disease   ? dicyclomine (BENTYL) 20 mg tablet Take one tablet by mouth every 6 hours as needed.   ? diphenoxylate/atropine (LOMOTIL) 2.5/0.025 mg tablet Take one tablet by mouth four times daily as needed for Diarrhea.   ? duloxetine DR (CYMBALTA) 60 mg capsule Take 60 mg by mouth daily with breakfast.   ? HUMULIN R U-500 (CONC) KWIKPEN 500 unit/mL (3 mL) injection PEN Take 310 units before breakfast, 250 units before lunch, and 125 units before dinner. Take about 20 minutes prior to the meal ? HYDROcodone/acetaminophen (NORCO) 5/325 mg tablet Take one tablet by mouth every 4 hours as needed for Pain   ? insulin pen needles (disposable) (B-D NANO) 32 gauge x 5/32 pen needle Use one each as directed three times daily before meals. Use with insulin injections.   ? lancets (ONE TOUCH DELICA) MISC Use 1 Each as directed before meals and at bedtime. Diag: Diabetes mellitus (E.11)   ? nebivolol (BYSTOLIC) 10 mg tablet Take one tablet by mouth daily.   ? ondansetron (ZOFRAN ODT) 4 mg rapid dissolve tablet Dissolve one tablet by mouth every 8 hours as needed for Nausea or Vomiting. Place on tongue to disolve.   ? spironolactone (ALDACTONE) 50 mg tablet Take 50 mg by mouth daily.   ? temazepam (RESTORIL) 15 mg capsule Take 15 mg by mouth at bedtime as needed.   ? traMADoL (ULTRAM) 50 mg tablet TAKE 1 TABLET BY MOUTH EVERY 6 HOURS AS NEEDED FOR PAIN   ? vedolizumab (ENTYVIO IV) Administer  through vein.     Vitals:    04/27/19 1049   BP: (!) 151/89   Pulse: 87   Temp: 36.7 ?C (98 ?F)   Weight: 103 kg (227 lb)   Height: 162.6 cm (64)   PainSc: Six     Body mass index is 38.96 kg/m?Marland Kitchen     Physical Exam    General   Well-developed, well-nourished   Communication and Voice:  Clear pitch and clarity, age appropriate   Head and Face   Inspection:  Normocephalic and atraumatic without masses or lesions  Eyes   Nystagmus: None   EOM: Equal extraocular motion bilaterally  ENT   External nose:  No scar or anatomic deformity   Internal Nose:  Septum intact and midline.  No edema, polyps, or rhinorrhea   Lips, Teeth, and gums:  Mucosa and teeth intact and viable   Oral cavity/oropharynx:  No erythema or exudate, no nodules, lesions or masses noted  Ear   External canal:  Left - Canal is patent with intact skin                             Right - Canal is patent with intact skin   Tympanic Membranes:  Left - Clear and mobile  Right - straw colored effusion visible Middle Ears:      Left - appears aerated, no effusion, no masses                           Right - appears aerated, no effusion, no masses  Respiratory   Respiratory effort:  Equal inspiration & expiration without use of accessory muscles. No stridor  Neuro/Psych/Balance   Orientation: Patient oriented to person, place, and time   Affect: Appropriate mood and affect    After informed consent was obtained, 4% lidocaine was instilled in the right ear canal.  After adequate topical anesthesia time the lidocaine was suctioned out and phenol placed on the anterior inferior quadrant for further anesthesia.  A myringotomy knife was then used to make a radial myringotomy and thin serous secretions were suctioned out with immediate improvement in hearing.  She tolerated this procedure well and there was no blood loss.           Assessment and Plan:    Right persistent serous otitis media with conductive hearing loss following acute URI last spring.  No evidence of allergic disease or chronic sinusitis as the underlying etiology.      We discussed myringotomy today or later with Dr. Jennette Snyder and she elected to have this done today. She noted immediate improvement in her hearing after myringotomy and removal of the effusion.  I will have her start cipro for the next 3 days and follow up in 2 months to reassess.  If her effusion persists we discussed ear tube placement as a next step.

## 2019-04-28 LAB — COVID-19 (SARS-COV-2) PCR

## 2019-04-28 NOTE — Progress Notes
Patient arrived to [place] clinic for COVID-19 testing 04/28/19 1100. Patient identity confirmed via photo I.D. Nasopharyngeal procedure explained to the patient.   Nasopharyngeal swab completed bilateral  Patient education provided given and instructed patient self isolate until contacted w/ results and further instructions. CDC handout on COVID-19 given to patient.   [email or web address]    Swab collected by Hyacinth Meeker.    Date symptoms began/reason for testing: Symptomatic 04/28/19

## 2019-04-29 ENCOUNTER — Encounter

## 2019-04-29 DIAGNOSIS — Z20822 Contact with and (suspected) exposure to covid-19: Secondary | ICD-10-CM

## 2019-04-29 MED ORDER — FLUTICASONE PROPIONATE 50 MCG/ACTUATION NA SPSN
1 | Freq: Every day | NASAL | 3 refills | 60.00000 days | Status: AC
Start: 2019-04-29 — End: ?

## 2019-05-04 ENCOUNTER — Encounter: Admit: 2019-05-04 | Discharge: 2019-05-04 | Payer: BC Managed Care – PPO

## 2019-05-04 ENCOUNTER — Encounter: Admit: 2019-05-04 | Discharge: 2019-05-05 | Payer: BC Managed Care – PPO

## 2019-05-04 DIAGNOSIS — J029 Acute pharyngitis, unspecified: Secondary | ICD-10-CM

## 2019-05-04 DIAGNOSIS — Z20828 Contact with and (suspected) exposure to other viral communicable diseases: Secondary | ICD-10-CM

## 2019-05-04 DIAGNOSIS — R509 Fever, unspecified: Secondary | ICD-10-CM

## 2019-05-04 MED ORDER — CIPROFLOXACIN HCL 0.3 % OP DROP
Freq: Two times a day (BID) | OPHTHALMIC | 1 refills | 17.00000 days | Status: DC
Start: 2019-05-04 — End: 2019-05-18

## 2019-05-13 ENCOUNTER — Encounter: Admit: 2019-05-13 | Discharge: 2019-05-13 | Payer: BC Managed Care – PPO

## 2019-05-13 DIAGNOSIS — R350 Frequency of micturition: Secondary | ICD-10-CM

## 2019-05-13 DIAGNOSIS — R300 Dysuria: Secondary | ICD-10-CM

## 2019-05-13 NOTE — Telephone Encounter
Will order UA. Unsure if you wanted any other orders placed though. Clayton Lefort, RN

## 2019-05-14 ENCOUNTER — Ambulatory Visit: Admit: 2019-05-14 | Discharge: 2019-05-14 | Payer: MEDICARE

## 2019-05-14 ENCOUNTER — Encounter: Admit: 2019-05-14 | Discharge: 2019-05-14 | Payer: BC Managed Care – PPO

## 2019-05-14 DIAGNOSIS — R350 Frequency of micturition: Secondary | ICD-10-CM

## 2019-05-14 DIAGNOSIS — R300 Dysuria: Secondary | ICD-10-CM

## 2019-05-14 LAB — URINALYSIS MICROSCOPIC REFLEX TO CULTURE

## 2019-05-14 LAB — URINALYSIS DIPSTICK
Lab: 1 (ref 1.003–1.035)
Lab: 6 (ref 5.0–8.0)
Lab: NEGATIVE
Lab: NEGATIVE
Lab: NEGATIVE
Lab: NEGATIVE
Lab: NEGATIVE
Lab: NEGATIVE
Lab: NEGATIVE

## 2019-05-14 LAB — URINALYSIS DIPSTICK REFLEX TO CULTURE
Lab: NEGATIVE
Lab: NEGATIVE
Lab: NEGATIVE

## 2019-05-14 MED ORDER — NITROFURANTOIN MACROCRYSTAL 100 MG PO CAP
100 mg | ORAL_CAPSULE | Freq: Two times a day (BID) | ORAL | 0 refills | 7.00000 days | Status: AC
Start: 2019-05-14 — End: ?

## 2019-05-17 ENCOUNTER — Encounter: Admit: 2019-05-17 | Discharge: 2019-05-17 | Payer: BC Managed Care – PPO

## 2019-05-17 DIAGNOSIS — R109 Unspecified abdominal pain: Secondary | ICD-10-CM

## 2019-05-17 NOTE — Telephone Encounter
Spoke with Linus Orn,   Continues to have urinary urgency,trace urine and intense worsening right sided flank pain. No hematuria.  Cx grew mixed flora.   She is on abx with worsening sx.     Kidney stone vs pyelo.  Recommend CT non con, BMP and repeat UA.   She is hesitant to go here vs home.  Recommend ER at home and she will consider.     Ordered for here.     Larena Glassman- can you call [facility] in the AM and evaluate?

## 2019-05-18 MED ORDER — TAMSULOSIN 0.4 MG PO CAP
.4 mg | ORAL_CAPSULE | Freq: Every day | ORAL | 0 refills | 90.00000 days | Status: DC
Start: 2019-05-18 — End: 2019-06-16

## 2019-05-18 MED ORDER — OXYCODONE 5 MG PO TAB
5 mg | ORAL_TABLET | ORAL | 0 refills | 6.00000 days | Status: DC | PRN
Start: 2019-05-18 — End: 2019-06-16

## 2019-05-18 MED ORDER — CIPROFLOXACIN HCL 500 MG PO TAB
500 mg | ORAL_TABLET | Freq: Two times a day (BID) | ORAL | 0 refills | 10.00000 days | Status: AC
Start: 2019-05-18 — End: ?

## 2019-05-19 ENCOUNTER — Encounter: Admit: 2019-05-19 | Discharge: 2019-05-19 | Payer: BC Managed Care – PPO

## 2019-05-22 ENCOUNTER — Ambulatory Visit: Admit: 2019-05-22 | Discharge: 2019-05-22 | Payer: BC Managed Care – PPO

## 2019-05-22 ENCOUNTER — Encounter: Admit: 2019-05-22 | Discharge: 2019-05-22 | Payer: BC Managed Care – PPO

## 2019-05-22 DIAGNOSIS — R109 Unspecified abdominal pain: Secondary | ICD-10-CM

## 2019-05-23 ENCOUNTER — Encounter: Admit: 2019-05-23 | Discharge: 2019-05-23 | Payer: BC Managed Care – PPO

## 2019-05-24 ENCOUNTER — Encounter: Admit: 2019-05-24 | Discharge: 2019-05-24 | Payer: BC Managed Care – PPO

## 2019-05-24 ENCOUNTER — Ambulatory Visit: Admit: 2019-05-24 | Discharge: 2019-05-24 | Payer: MEDICARE

## 2019-05-24 DIAGNOSIS — R109 Unspecified abdominal pain: Secondary | ICD-10-CM

## 2019-05-24 DIAGNOSIS — K50119 Crohn's disease of large intestine with unspecified complications: Secondary | ICD-10-CM

## 2019-05-24 DIAGNOSIS — D509 Iron deficiency anemia, unspecified: Secondary | ICD-10-CM

## 2019-05-24 DIAGNOSIS — N179 Acute kidney failure, unspecified: Secondary | ICD-10-CM

## 2019-05-24 LAB — IRON + BINDING CAPACITY + %SAT+ FERRITIN
Lab: 35 ug/dL — ABNORMAL LOW (ref 50–160)
Lab: 536 ug/dL — ABNORMAL HIGH (ref 270–380)
Lab: 7 % — ABNORMAL LOW (ref 28–42)

## 2019-05-24 LAB — CBC
Lab: 10 g/dL — ABNORMAL LOW (ref 12.0–15.0)
Lab: 11 10*3/uL — ABNORMAL HIGH (ref 4.5–11.0)
Lab: 20 % — ABNORMAL HIGH (ref 11–15)
Lab: 24 pg — ABNORMAL LOW (ref 26–34)
Lab: 31 g/dL — ABNORMAL LOW (ref 32.0–36.0)
Lab: 33 % — ABNORMAL LOW (ref 36–45)
Lab: 353 K/UL (ref 150–400)
Lab: 4.2 M/UL — ABNORMAL HIGH (ref 4.0–5.0)
Lab: 77 FL — ABNORMAL LOW (ref 80–100)
Lab: 8.1 FL — AB (ref 7–11)

## 2019-05-24 LAB — COMPREHENSIVE METABOLIC PANEL
Lab: 140 MMOL/L (ref 137–147)
Lab: 38 mL/min — ABNORMAL LOW (ref >60)
Lab: 4.2 MMOL/L (ref >60)
Lab: 46 mL/min — ABNORMAL LOW (ref >60)

## 2019-05-24 LAB — URINALYSIS DIPSTICK: Lab: NEGATIVE (ref 3–12)

## 2019-05-24 LAB — SED RATE: Lab: 24 mm/h — ABNORMAL HIGH (ref 0–30)

## 2019-05-24 LAB — C REACTIVE PROTEIN (CRP): Lab: 0.5 mg/dL — ABNORMAL HIGH (ref <1.0)

## 2019-05-24 NOTE — Progress Notes
Spoke with Tina Snyder and reviewed imaging. Her abdominal pain is persistent but significantly improved. Stabbing RUQ.  Findings of nephrolithiasis and stool are on the left and left lower side.   She does not have a gall bladder. Wonders about crohsn.  Has not taken her infusions since November due to insurance reasons. She is at the lab now.  She had hematuria on Thursday but urinary sx are resolved.   > ua, cbc, cmp, esr, crp

## 2019-05-25 ENCOUNTER — Encounter: Admit: 2019-05-25 | Discharge: 2019-05-25 | Payer: BC Managed Care – PPO

## 2019-05-26 ENCOUNTER — Encounter: Admit: 2019-05-26 | Discharge: 2019-05-26 | Payer: BC Managed Care – PPO

## 2019-05-27 ENCOUNTER — Encounter: Admit: 2019-05-27 | Discharge: 2019-05-27 | Payer: BC Managed Care – PPO

## 2019-05-27 MED ORDER — FERRIC CARBOXYMALTOSE IVPB
750 mg | Freq: Once | INTRAVENOUS | 0 refills | Status: CN
Start: 2019-05-27 — End: ?

## 2019-06-02 ENCOUNTER — Encounter: Admit: 2019-06-02 | Discharge: 2019-06-02 | Payer: BC Managed Care – PPO

## 2019-06-02 ENCOUNTER — Ambulatory Visit: Admit: 2019-06-02 | Discharge: 2019-06-03 | Payer: MEDICARE

## 2019-06-02 ENCOUNTER — Ambulatory Visit: Admit: 2019-06-02 | Discharge: 2019-06-02 | Payer: BC Managed Care – PPO

## 2019-06-02 DIAGNOSIS — I10 Essential (primary) hypertension: Secondary | ICD-10-CM

## 2019-06-02 DIAGNOSIS — J479 Bronchiectasis, uncomplicated: Secondary | ICD-10-CM

## 2019-06-02 DIAGNOSIS — J302 Other seasonal allergic rhinitis: Secondary | ICD-10-CM

## 2019-06-02 DIAGNOSIS — K219 Gastro-esophageal reflux disease without esophagitis: Secondary | ICD-10-CM

## 2019-06-02 DIAGNOSIS — D699 Hemorrhagic condition, unspecified: Secondary | ICD-10-CM

## 2019-06-02 DIAGNOSIS — IMO0002 Ulcer: Secondary | ICD-10-CM

## 2019-06-02 DIAGNOSIS — I8290 Acute embolism and thrombosis of unspecified vein: Secondary | ICD-10-CM

## 2019-06-02 DIAGNOSIS — G40909 Epilepsy, unspecified, not intractable, without status epilepticus: Secondary | ICD-10-CM

## 2019-06-02 DIAGNOSIS — K7581 Nonalcoholic steatohepatitis (NASH): Secondary | ICD-10-CM

## 2019-06-02 DIAGNOSIS — K5090 Crohn's disease, unspecified, without complications: Secondary | ICD-10-CM

## 2019-06-02 DIAGNOSIS — R42 Dizziness and giddiness: Secondary | ICD-10-CM

## 2019-06-02 DIAGNOSIS — K639 Disease of intestine, unspecified: Secondary | ICD-10-CM

## 2019-06-02 DIAGNOSIS — E139 Other specified diabetes mellitus without complications: Secondary | ICD-10-CM

## 2019-06-02 DIAGNOSIS — G43909 Migraine, unspecified, not intractable, without status migrainosus: Secondary | ICD-10-CM

## 2019-06-02 DIAGNOSIS — D4959 Neoplasm of unspecified behavior of other genitourinary organ: Secondary | ICD-10-CM

## 2019-06-02 DIAGNOSIS — E119 Type 2 diabetes mellitus without complications: Secondary | ICD-10-CM

## 2019-06-02 DIAGNOSIS — M359 Systemic involvement of connective tissue, unspecified: Secondary | ICD-10-CM

## 2019-06-02 DIAGNOSIS — M797 Fibromyalgia: Secondary | ICD-10-CM

## 2019-06-02 MED ORDER — FERRIC CARBOXYMALTOSE IVPB
750 mg | Freq: Once | INTRAVENOUS | 0 refills | Status: CN
Start: 2019-06-02 — End: ?

## 2019-06-02 NOTE — Patient Instructions
Clinic Visit Summary:     My nurse is Duc Crocket.  You can reach her at [phone]    Please contact the Pulmonary Nurse Coordinator with signs and symptoms of worsening productive cough with thick secretions, blood in sputum, chest tightness/pain, shortness of breath, fever, chills, night sweats, or any questions or concerns.     Pulmonary RN Coordinator - Winton Offord, RN at [phone] or fax [phone]     For refills on medications, please have your pharmacy fax a refill authorization request form to our office at (Fax) [phone]. Please allow at least 3 business days for refill requests.     For urgent issues after business hours/weekends/holidays call [phone] and request for the pulmonary fellow to be paged.

## 2019-06-02 NOTE — Progress Notes
Obtained patient's verbal consent to treat them and their agreement to [facility] financial policy and NPP via this telehealth visit during the [facility]    Subjective:       History of Present Illness  Tina Snyder is a 54 y.o. female. 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 She has a history of Crohn's disease and has been on Entyvio in the past, but unfortunately her last dose was in November 2020 as she had a change in her insurance plan at that time and has been unable to receive further doses since then. Apparently the precertification department at [facility] is currently working on obtaining authorization for her to receive further doses. She states that she has been in her usual state of health from a pulmonary standpoint.  She has had some complaints of right of spinal discharge, which have been greenish in nature.  She feels that this is mostly upper airway in nature and not pulmonary.  She recently had a CT abdomen and pelvis which demonstrated at least on the lower cuts of her abdomen to be unchanged from a pulmonary standpoint.  There were no acute infiltrates in the lower lobes bilaterally.  She denies chest pain.  Denies fevers or chills.  She does occasionally have night sweats but this is unchanged and related to early menopause from her hysterectomy.  She is to see Dr. Jeanella Craze very shortly for follow-up.  She has been offered tympanostomy tubes by her report.  She continues to have conductive hearing loss and recurrent otitis media.  She has had improvement in hearing with myringotomy in the past.       Review of Systems   Constitutional: Positive for fatigue.   HENT: Positive for ear discharge, hearing loss, rhinorrhea and sinus pain.    Respiratory: Positive for cough.    All other systems reviewed and are negative.        Objective:         ? AMITR/GABAPEN/EMU OIL 07/24/08% CREAM (COMPOUND) Apply small amount to affected area ? amoxicillin-potassium clavulanate (AUGMENTIN) 875/125 mg tablet One tab po bid   ? atorvastatin (LIPITOR) 40 mg tablet Take one tablet by mouth daily.   ? blood sugar diagnostic (ONETOUCH VERIO) test strip Use 1 Strip as directed before meals and at bedtime.   ? Blood-Glucose Sensor (DEXCOM G6 SENSOR) devi Use 1 each as directed as directed. Change every 10 days   ? budesonide (ENTOCORT EC) 3 mg capsule TAKE 2 CAPSULES BY MOUTH EVERY MORNING   ? bumetanide (BUMEX) 1 mg tablet Take one tablet by mouth daily.   ? buPROPion XL (WELLBUTRIN XL) 300 mg tablet Take one tablet by mouth every morning. Do not crush or chew.   ? cyclobenzaprine (FLEXERIL) 10 mg tablet Take 10 mg by mouth daily as needed. .   ? DEXCOM G6 RECEIVER misc Use 1 each as directed daily.   ? DEXCOM G6 TRANSMITTER devi Use 1 each as directed as directed. Every 90 days   ? dexlansoprazole (DEXILANT) 60 mg capsule Take one capsule by mouth daily. Indications: gastroesophageal reflux disease   ? dicyclomine (BENTYL) 20 mg tablet Take one tablet by mouth every 6 hours as needed.   ? diphenoxylate/atropine (LOMOTIL) 2.5/0.025 mg tablet Take one tablet by mouth four times daily as needed for Diarrhea.   ? duloxetine DR (CYMBALTA) 60 mg capsule Take 60 mg by mouth daily with breakfast.   ? fluticasone  propionate (FLONASE) 50 mcg/actuation nasal spray, suspension Apply one spray to each nostril as directed daily. Shake bottle gently before using.   ? HUMULIN R U-500 (CONC) KWIKPEN 500 unit/mL (3 mL) injection PEN Take 310 units before breakfast, 250 units before lunch, and 125 units before dinner. Take about 20 minutes prior to the meal   ? HYDROcodone/acetaminophen (NORCO) 5/325 mg tablet Take one tablet by mouth every 4 hours as needed for Pain   ? insulin pen needles (disposable) (B-D NANO) 32 gauge x 5/32 pen needle Use one each as directed three times daily before meals. Use with insulin injections. ? lancets (ONE TOUCH DELICA) MISC Use 1 Each as directed before meals and at bedtime. Diag: Diabetes mellitus (E.11)   ? nebivolol (BYSTOLIC) 10 mg tablet Take one tablet by mouth daily.   ? ondansetron (ZOFRAN ODT) 4 mg rapid dissolve tablet Dissolve one tablet by mouth every 8 hours as needed for Nausea or Vomiting. Place on tongue to disolve.   ? oxyCODONE (ROXICODONE) 5 mg tablet Take one tablet by mouth every 4 hours as needed for Pain   ? spironolactone (ALDACTONE) 50 mg tablet Take 50 mg by mouth daily.   ? tamsulosin (FLOMAX) 0.4 mg capsule Take one capsule by mouth daily. Do not crush, chew or open capsules. Take 30 minutes following the same meal each day.   ? temazepam (RESTORIL) 15 mg capsule Take 15 mg by mouth at bedtime as needed.   ? traMADoL (ULTRAM) 50 mg tablet TAKE 1 TABLET BY MOUTH EVERY 6 HOURS AS NEEDED FOR PAIN   ? vedolizumab (ENTYVIO IV) Administer  through vein.     Vitals:    06/02/19 0839   Weight: 101.2 kg (223 lb)   Height: 162.6 cm (64)   PainSc: Six     Body mass index is 38.28 kg/m?Marland Kitchen              Assessment and Plan:  Impression:  1.  Prior history of MAI infection of the lung.  This appears to be quiesced sent today.  She has not been treated since 2016 during which time she received 18 months of 4 drug therapy under the direction of Dr. Colon Flattery.  2.  Chronic bronchiectasis.  She has been taking Monday Wednesday Friday azithromycin for management but has not required any medical intervention for management of bronchiectasis for some time now.  3.  GE reflux  4.  Crohn's disease, managed by Dr. Janene Madeira.  Currently on interruption of therapy with [facility] but will likely receive once again this year when insurance coverage is obtained.  5.  Chronic cough, most likely due to upper airway causes.  6.  OSA, on CPAP therapy per Dr. Evonnie Pat.    Plan: 1.  No sign of recurrent MAI at this time.  Will obtain 2 view chest x-ray today or in the near future as convenient to rule out the presence of any pulmonary infiltrates leading to worsening sputum production.  I do believe the sputum production is most likely upper airway in nature.  2.  Okay to continue on Entyvio from a pulmonary standpoint for the time being  3.  May consider obtaining repeat CT chest should she have persistent symptoms.    Follow-up in 6 months, telehealth okay.  Interval follow-up as necessary.  I will follow up the results of the chest x-ray with her when these are available.    I spent >60% of this 30 minute encounter discussing with and  counseling the patient regarding the management plan as outlined above.

## 2019-06-02 NOTE — Progress Notes
Did patient read financial policy, consent to treat, and notice of privacy practices? Yes    Does the patient give verbal consent to each policy? Yes    Does the patient have any vitals to report? Yes    Weight Vitals charted in O2? Yes    Is the patient in pain? 5/10    Screening questions completed? Yes    Is the patient able to acces the Mychart message with the start visit link? Yes    Is patient in "virtual waiting room" No

## 2019-06-03 ENCOUNTER — Ambulatory Visit: Admit: 2019-06-03 | Discharge: 2019-06-04 | Payer: MEDICARE

## 2019-06-03 ENCOUNTER — Encounter: Admit: 2019-06-03 | Discharge: 2019-06-03 | Payer: BC Managed Care – PPO

## 2019-06-03 DIAGNOSIS — K5090 Crohn's disease, unspecified, without complications: Secondary | ICD-10-CM

## 2019-06-03 DIAGNOSIS — E1165 Type 2 diabetes mellitus with hyperglycemia: Principal | ICD-10-CM

## 2019-06-03 DIAGNOSIS — E119 Type 2 diabetes mellitus without complications: Secondary | ICD-10-CM

## 2019-06-03 DIAGNOSIS — K639 Disease of intestine, unspecified: Secondary | ICD-10-CM

## 2019-06-03 DIAGNOSIS — M359 Systemic involvement of connective tissue, unspecified: Secondary | ICD-10-CM

## 2019-06-03 DIAGNOSIS — D4959 Neoplasm of unspecified behavior of other genitourinary organ: Secondary | ICD-10-CM

## 2019-06-03 DIAGNOSIS — Z794 Long term (current) use of insulin: Secondary | ICD-10-CM

## 2019-06-03 DIAGNOSIS — J302 Other seasonal allergic rhinitis: Secondary | ICD-10-CM

## 2019-06-03 DIAGNOSIS — R42 Dizziness and giddiness: Secondary | ICD-10-CM

## 2019-06-03 DIAGNOSIS — K7581 Nonalcoholic steatohepatitis (NASH): Secondary | ICD-10-CM

## 2019-06-03 DIAGNOSIS — D699 Hemorrhagic condition, unspecified: Secondary | ICD-10-CM

## 2019-06-03 DIAGNOSIS — I8290 Acute embolism and thrombosis of unspecified vein: Secondary | ICD-10-CM

## 2019-06-03 DIAGNOSIS — IMO0002 Ulcer: Secondary | ICD-10-CM

## 2019-06-03 DIAGNOSIS — E139 Other specified diabetes mellitus without complications: Secondary | ICD-10-CM

## 2019-06-03 DIAGNOSIS — G40909 Epilepsy, unspecified, not intractable, without status epilepticus: Secondary | ICD-10-CM

## 2019-06-03 DIAGNOSIS — M797 Fibromyalgia: Secondary | ICD-10-CM

## 2019-06-03 DIAGNOSIS — I10 Essential (primary) hypertension: Secondary | ICD-10-CM

## 2019-06-03 DIAGNOSIS — G43909 Migraine, unspecified, not intractable, without status migrainosus: Secondary | ICD-10-CM

## 2019-06-03 DIAGNOSIS — K219 Gastro-esophageal reflux disease without esophagitis: Secondary | ICD-10-CM

## 2019-06-03 MED ORDER — FERRIC CARBOXYMALTOSE IVPB
750 mg | Freq: Once | INTRAVENOUS | 0 refills | Status: CN
Start: 2019-06-03 — End: ?

## 2019-06-03 MED ORDER — VEDOLIZUMAB IVPB
300 mg | Freq: Once | INTRAVENOUS | 0 refills | Status: CN
Start: 2019-06-03 — End: ?

## 2019-06-03 MED ORDER — VEDOLIZUMAB IVPB
300 mg | Freq: Once | INTRAVENOUS | 0 refills | Status: CP
Start: 2019-06-03 — End: ?
  Administered 2019-06-03 (×2): 300 mg via INTRAVENOUS

## 2019-06-03 MED ORDER — ACETAMINOPHEN 500 MG PO TAB
500 mg | Freq: Once | ORAL | 0 refills | Status: CP
Start: 2019-06-03 — End: ?
  Administered 2019-06-03: 21:00:00 500 mg via ORAL

## 2019-06-03 MED ORDER — DIPHENHYDRAMINE HCL 25 MG PO CAP
25 mg | Freq: Once | ORAL | 0 refills | Status: CN
Start: 2019-06-03 — End: ?

## 2019-06-03 MED ORDER — DIPHENHYDRAMINE HCL 50 MG/ML IJ SOLN
25 mg | Freq: Once | INTRAVENOUS | 0 refills | Status: CP
Start: 2019-06-03 — End: ?
  Administered 2019-06-03: 21:00:00 25 mg via INTRAVENOUS

## 2019-06-03 MED ORDER — DIPHENHYDRAMINE HCL 50 MG/ML IJ SOLN
25 mg | Freq: Once | INTRAVENOUS | 0 refills | Status: CN
Start: 2019-06-03 — End: ?

## 2019-06-03 MED ORDER — ACETAMINOPHEN 500 MG PO TAB
500 mg | Freq: Once | ORAL | 0 refills | Status: CN
Start: 2019-06-03 — End: ?

## 2019-06-03 MED ORDER — DIPHENHYDRAMINE HCL 25 MG PO CAP
25 mg | Freq: Once | ORAL | 0 refills | Status: DC
Start: 2019-06-03 — End: 2019-06-04

## 2019-06-03 MED ADMIN — DIPHENHYDRAMINE HCL 50 MG/ML IJ SOLN [2508]: 50 mg | INTRAVENOUS | @ 22:00:00 | Stop: 2019-06-03 | NDC 00641037621

## 2019-06-04 ENCOUNTER — Encounter: Admit: 2019-06-04 | Discharge: 2019-06-04 | Payer: BC Managed Care – PPO

## 2019-06-04 DIAGNOSIS — K50918 Crohn's disease, unspecified, with other complication: Secondary | ICD-10-CM

## 2019-06-04 NOTE — Telephone Encounter
-----   Message from Viviann Spare, MD sent at 06/03/2019  7:06 PM CST -----  Labs fairly stable  Can she take oral iron 325 mg twice daily?

## 2019-06-04 NOTE — Telephone Encounter
Patient's PCP has ordered IV iron as unable to tolerate PO Iron.  Patient will contact infusion center to schedule.

## 2019-06-06 ENCOUNTER — Encounter: Admit: 2019-06-06 | Discharge: 2019-06-06 | Payer: BC Managed Care – PPO

## 2019-06-06 DIAGNOSIS — R42 Dizziness and giddiness: Secondary | ICD-10-CM

## 2019-06-06 DIAGNOSIS — K7581 Nonalcoholic steatohepatitis (NASH): Secondary | ICD-10-CM

## 2019-06-06 DIAGNOSIS — I10 Essential (primary) hypertension: Secondary | ICD-10-CM

## 2019-06-06 DIAGNOSIS — G40909 Epilepsy, unspecified, not intractable, without status epilepticus: Secondary | ICD-10-CM

## 2019-06-06 DIAGNOSIS — K639 Disease of intestine, unspecified: Secondary | ICD-10-CM

## 2019-06-06 DIAGNOSIS — K219 Gastro-esophageal reflux disease without esophagitis: Secondary | ICD-10-CM

## 2019-06-06 DIAGNOSIS — I8290 Acute embolism and thrombosis of unspecified vein: Secondary | ICD-10-CM

## 2019-06-06 DIAGNOSIS — M797 Fibromyalgia: Secondary | ICD-10-CM

## 2019-06-06 DIAGNOSIS — J302 Other seasonal allergic rhinitis: Secondary | ICD-10-CM

## 2019-06-06 DIAGNOSIS — E119 Type 2 diabetes mellitus without complications: Secondary | ICD-10-CM

## 2019-06-06 DIAGNOSIS — IMO0002 Ulcer: Secondary | ICD-10-CM

## 2019-06-06 DIAGNOSIS — M359 Systemic involvement of connective tissue, unspecified: Secondary | ICD-10-CM

## 2019-06-06 DIAGNOSIS — D4959 Neoplasm of unspecified behavior of other genitourinary organ: Secondary | ICD-10-CM

## 2019-06-06 DIAGNOSIS — E139 Other specified diabetes mellitus without complications: Secondary | ICD-10-CM

## 2019-06-06 DIAGNOSIS — G43909 Migraine, unspecified, not intractable, without status migrainosus: Secondary | ICD-10-CM

## 2019-06-06 DIAGNOSIS — D699 Hemorrhagic condition, unspecified: Secondary | ICD-10-CM

## 2019-06-06 DIAGNOSIS — K5090 Crohn's disease, unspecified, without complications: Secondary | ICD-10-CM

## 2019-06-11 ENCOUNTER — Ambulatory Visit: Admit: 2019-06-11 | Discharge: 2019-06-11 | Payer: MEDICARE

## 2019-06-11 ENCOUNTER — Encounter: Admit: 2019-06-11 | Discharge: 2019-06-11 | Payer: BC Managed Care – PPO

## 2019-06-11 DIAGNOSIS — R911 Solitary pulmonary nodule: Secondary | ICD-10-CM

## 2019-06-11 DIAGNOSIS — J479 Bronchiectasis, uncomplicated: Secondary | ICD-10-CM

## 2019-06-11 DIAGNOSIS — K50018 Crohn's disease of small intestine with other complication: Secondary | ICD-10-CM

## 2019-06-11 DIAGNOSIS — R609 Edema, unspecified: Secondary | ICD-10-CM

## 2019-06-11 MED ORDER — BUDESONIDE 3 MG PO CECX
6 mg | ORAL_CAPSULE | Freq: Every day | ORAL | 1 refills | 30.00000 days | Status: DC
Start: 2019-06-11 — End: 2019-11-22

## 2019-06-11 MED ORDER — BUMETANIDE 1 MG PO TAB
ORAL_TABLET | Freq: Every day | 1 refills | Status: DC
Start: 2019-06-11 — End: 2019-11-22

## 2019-06-14 ENCOUNTER — Encounter: Admit: 2019-06-14 | Discharge: 2019-06-14 | Payer: BC Managed Care – PPO

## 2019-06-14 DIAGNOSIS — K5080 Crohn's disease of both small and large intestine without complications: Secondary | ICD-10-CM

## 2019-06-14 MED ORDER — TRAMADOL 50 MG PO TAB
50 mg | ORAL_TABLET | ORAL | 0 refills | Status: AC | PRN
Start: 2019-06-14 — End: ?

## 2019-06-16 MED ORDER — TEMAZEPAM 15 MG PO CAP
15 mg | ORAL_CAPSULE | Freq: Every evening | ORAL | 0 refills | Status: AC | PRN
Start: 2019-06-16 — End: ?

## 2019-06-17 ENCOUNTER — Encounter: Admit: 2019-06-17 | Discharge: 2019-06-17 | Payer: BC Managed Care – PPO

## 2019-06-21 ENCOUNTER — Encounter: Admit: 2019-06-21 | Discharge: 2019-06-21 | Payer: BC Managed Care – PPO

## 2019-06-24 ENCOUNTER — Encounter: Admit: 2019-06-24 | Discharge: 2019-06-24 | Payer: BC Managed Care – PPO

## 2019-06-24 NOTE — Telephone Encounter
Received VM from pt clarifying the appt request she received form our office.   On review of chart no follow up appt was scheduled after LOV.   Call placed to pt. Discussed that appt request was to schedule her for her next follow up with Dr. Aline Brochure as this was not scheduled after her LOV. Informed pt that our scheduling department is likely following up on this. Pt stated she would contact scheduling to schedule soon. Pt verbalized understanding with no further questions or concerns at this time.

## 2019-06-28 ENCOUNTER — Ambulatory Visit: Admit: 2019-06-28 | Discharge: 2019-06-29 | Payer: BC Managed Care – PPO

## 2019-06-28 ENCOUNTER — Encounter: Admit: 2019-06-28 | Discharge: 2019-06-28 | Payer: BC Managed Care – PPO

## 2019-06-28 DIAGNOSIS — D229 Melanocytic nevi, unspecified: Secondary | ICD-10-CM

## 2019-06-28 DIAGNOSIS — D489 Neoplasm of uncertain behavior, unspecified: Secondary | ICD-10-CM

## 2019-06-28 DIAGNOSIS — L821 Other seborrheic keratosis: Secondary | ICD-10-CM

## 2019-06-28 DIAGNOSIS — Z1389 Encounter for screening for other disorder: Secondary | ICD-10-CM

## 2019-06-28 DIAGNOSIS — Z9225 Personal history of immunosupression therapy: Secondary | ICD-10-CM

## 2019-06-28 MED ORDER — SUCRALFATE 1 GRAM PO TAB
1 g | ORAL_TABLET | Freq: Three times a day (TID) | ORAL | 3 refills | Status: AC | PRN
Start: 2019-06-28 — End: ?

## 2019-06-28 NOTE — Patient Instructions
Shave Biopsy Site Care    - Please keep your bandage on for 24 hours  - You may remove bandage after 24 hours and clean with gentle soap and water  - Keep covered with vaseline and a bandage until healed  - Please call us if you develop swelling, pain, redness at biopsy site   - Avoid prolonged sun exposure, as this may worsen the cosmetic outcome  - A resident/doctor will call you if your biopsy requires further treatment. Otherwise, you will receive a benign result letter by Earleen Reaper or by mail.     Vitamin D  - We recommend Vitamin D supplementation after a fatty meal, especially if there is no contraindications such as kidney stones    Moles  --Common melanocytic nevi (moles) tend to be =6 mm in diameter and symmetric with even pigmentation, round or oval shape, regular outline, and sharp, non-fuzzy border.  --Dermal nevi stick out from the skin, but if they are soft they are usually not worrisome.  --Flaky seemingly stuck-on brown bumps are usually benign keratoses, not moles.  --Bright red smooth bumps that do not bleed are usually benign blood vessel lesions (cherry angiomas), not moles.  --Atypical nevi/clinical features of possible melanoma include asymmetry, border irregularities, color variability, and diameter >6 mm.  The earliest sign of melanoma is usually a rapidly growing mole.  --About half of melanoma arises in an existing mole and up to half on normal skin.  --It is normal to get new moles until the age of 20-39 years old.  --Multiple atypical nevi are a marker of increased risk of melanoma. The risk of melanoma depends also upon the total number of nevi, family and/or personal history of melanoma, and sun exposure history.   --It is important to look at your moles once a month.  It may help to follow them with photos such as on your smart phone.  Looking once a month you can notice rapid changes and call if these occur.  --Using sunscreens and sun avoidance will decrease your risk of developing melanoma.  The best sun protection is sun avoidance, including with clothing such as long sleeves and a broad brimmed hat.  The best sunscreens are SPF 30 or above cream based with zinc oxide.  One ounce (shot glass sized) amount is needed for an adult.  It should be reapplied every 2 hours if possible.  --Any tanning bed use will increase your risk of melanoma significantly.    Sun Protection   UPF/SPF rated clothing (gloves, long sleeves, scarves); broad-brimmed hats (NOT ball caps!)   [employer] additive can increase the SPF value of your everyday clothing   Cowboy hats protect from sun; baseball hats don't   Here are several recommended zinc-based sunscreen brands in aplphabetical order (always read the ingredient list, as many brands have multiple varieties of sunscreens and not all are zinc-based)   Cyndia Bent, Coca Cola, [employer], [place], [place], Goddess Garden, Green Screen,  [employer], Forensic scientist, SkinCeuticals, Vanicream   2 shot-glases = whole body    Melanoma Patient Information    Also called malignant melanoma     Skin cancer screening: If you notice a mole that differs from others or one that changes, bleeds, or itches, see a dermatologist.   Melanoma is a type of skin cancer. Anyone can get melanoma. When found early and treated, the cure rate is nearly 100%. Allowed to grow, melanoma can spread to other parts of the body. Melanoma  can spread quickly. When melanoma spreads, it can be deadly.Dermatologists believe that the number of deaths from melanoma would be much lower if people:  Knew the warning signs of melanoma.   Learned how to examine their skin for signs of skin cancer.   Took the time to examine their skin.   It's important to take time to look at the moles on your skin because this is a good way to find melanoma early. When checking your skin, you should look for the ABCDEs of melanoma.     ABCDE's of melanoma:  When performing monthly skin exams for your moles or new moles, remember the ABCDE's of melanoma:    A - Asymmetry. (Concerning if spot is not symmetric)  B - Border. (Irregular border or notched border are concerning)  C - Color. (Multiple colors or changes in color are concerning.)  D - Diameter. (Larger than 6mm, ie, a pencil eraser, is concerning.)  E - Evolution. (An evolving or changing spot is concerning. If new itch, tenderness, or bleeding develop, these are concerning changes too.  See further explanation below:    Melanoma: Signs and symptoms     Anyone can get melanoma. It's important to take time to look at the moles on your skin because this is a good way to find melanoma early. When checking your skin, you should look for the ABCDEs of melanoma.     ABCDEs of melanoma     A = Asymmetry  One half is unlike the other half.       B = Border  An irregular, scalloped, or poorly defined border.       C = Color  Is varied from one area to another; has shades of tan, brown or black, or is sometimes white, red, or blue.       D = Diameter  Melanomas usually greater than 6mm (the size of a pencil eraser) when diagnosed, but they can be smaller.       E = Evolving  A mole or skin lesion that looks different from the rest or is changing in size, shape, or color.    !! If you see a mole or new spot on your skin that has any of the ABCDEs, immediately make an appointment to see a dermatologist.    Signs of melanoma  The most common early signs (what you see) of melanoma are:     Growing mole on your skin.   Unusual looking mole on your skin or a mole that does not look like any other mole on your skin (the ugly duckling).   Non-uniform mole (has an odd shape, uneven or uncertain border, different colors).     Symptoms of melanoma  In the early stages, melanoma may not cause any symptoms (what you feel). But sometimes melanoma will:    Itch.    Bleed.    Feel painful.   Many melanomas have these signs and symptoms, but not all. There are different types of melanoma. One type can first appear as a brown or black streak underneath a fingernail or toenail. Melanoma also can look like a bruise that just won't heal.     Who gets melanoma?  Anyone can get melanoma. Most people who get it have light skin, but people who have brown and black skin also get melanoma.   Some people have a higher risk of getting melanoma. These people have the following traits:   Skin    Fair  skin (The risk is higher if the person also has red or blond hair and blue or green eyes).    Sun-sensitive skin (rarely tans or burns easily).    50-plus moles, large moles, or unusual-looking moles.    If you have had bad sunburns or spent time tanning (sun, tanning beds, or sun lamps), you also have a higher risk of getting melanoma.   Men older than 50 are at a higher risk for developing skin cancers, including melanoma. Learning how to check your skin and getting skin exams can help detect skin cancer.    Family/medical history   Melanoma runs in the family (parent, child, sibling, cousin, aunt, uncle had melanoma).   You had another skin cancer, but most especially another melanoma.   A weakened immune system.      Research shows that indoor tanning increases a person's melanoma risk by 75%. The risk also may increase if you had breast or thyroid cancer.    More people getting melanoma  Fewer people are getting most types of cancer. Melanoma is different. More people are getting melanoma. Many are white men who are 50 years or older. More young people also are getting melanoma. Melanoma is now the most common cancer among people 26-86 years old. Even teenagers are getting melanoma.    What causes melanoma?  Ultraviolet (UV) radiation is a major contributor in most cases. We get UV radiation from the sun, tanning beds, and sun lamps. Heredity also plays a role. Research shows that if a close blood relative (parent, child, sibling, aunt, uncle) had melanoma, a person has a much greater risk of getting melanoma.     How do dermatologists diagnose melanoma?  To diagnose melanoma, a dermatologist begins by looking at the patient's skin. A dermatologist will carefully examine moles and other suspicious spots. To get a better look, a dermatologist may use a device called a dermoscope.      Vitamin D    Our bodies need vitamin D to build strong and healthy bones. Vitamin D helps the body absorb the calcium that our bones require.     For a healthy person, the recommended daily dietary allowance is 600 international units for people of 72-55 years old, and 800 international units for people older than 71 years. More vitamin D is not better. Higher amounts of vitamin D could be harmful, leading to many health problems such as high blood pressure and kidney damage.     American academy of Dermatology recommending everyone get vitamin D from foods naturally rich in vitamin D, foods and beverages fortified with vitamin D or vitamin D supplements. The foods that contain the greatest amount are fatty fishes such as salmon, tuna and mackerel. Fish liver oil is another good source.     One of the sources to look up vitamin amount is through [employer]. PrankTips[employer]. This can help you find out whether you get enough vitamin D from your diet. If you are like many people, you may not be getting your recommended dietary allowance of vitamin D. You may want to change the foods that you eat or take a vitamin D supplements. Before you start taking a vitamin D supplement, talk with your doctor.     Vitamin D is produced in the skin by UV light, but the amount is highly variable and depends on many factors. However, getting vitamin D from the sun or tanning beds can 1) increase your risk of developing skin cancer  including melanoma which can be deadly, 2) resulting premature skin aging (wrinkles, age spots, blotchy complexion) and 3) leading to a weakened immune system. Therefore, American academy of Dermatology recommend getting vitamin D safely from foods, beverages and supplements.

## 2019-06-28 NOTE — Procedures
Shave biopsy procedure note    Risk and benefits of the above procedure including bleeding, pain, dyspigmentation, scar, infection, recurrence or nerve damage with loss of muscle function and/or skin sensation were discussed with the patient (or legal guardian) in detail, who afterwards decided to proceed with the procedure.    Procedure Time Out Check List:  Prior to the start of the procedure, I personally confirmed the following:    Site Marking Verified: Yes, as appropriate  Patient Identity (name & date of birth): Yes  Procedure: Yes  Site: Yes  Body Part: see above    The risks of the procedure, including infection, bleeding, pain and skin changes, were discussed with the patient.    Diagnosis: NUO  Body Site: (See progress note)  Preparation:  Alcohol   Anesthesia:  1% lidocaine with epinephrine  Instrument:  Dermablade  Hemostasis:  AlCl3  Closure:  None  Wound dressing:  Vaseline  Wound care instructions given:  Verbal  Complications:  None  Tolerated well: Yes  Ambulated from room:  Yes  Pathology sent to:  [facility]  Duration of proedure:  >5 minutes

## 2019-06-28 NOTE — Progress Notes
ATTESTATION    I personally performed the key portions of the E/M visit, discussed case with resident and concur with resident documentation of history, physical exam, assessment, and treatment plan unless otherwise noted.    Staff name:  Melinda Crutch, MD Date:  06/28/2019       I personally performed the biops(sies) today myself.

## 2019-06-28 NOTE — Progress Notes
Date of Service: 06/28/2019    Subjective:             Tina Snyder is a 54 y.o. female.    History of Present Illness    History reviewed from Dr. Laurin Coder office visit 06/2016 and is unchanged unless otherwise noted     # H/o Multiple Melanocytic Nevi:  - patient has a history of brown and tan macules and papules?distributed over the head, trunk, arms and legs that?have been present for many years.  - no recent change, bleeding or itching in these nevi  - history of blistering sunburns????    ?# Immunosuppression  - on MTX for Crohns, previously on infliximab and prednisone  ?  # Actinic Keratosis  -LN2 in the past    # NEW spot on the L upper inner thigh  - present for many years  - starting around November 2020 it had grown in size and is painful?  ?  No personal hx of skin cancer  Mother with BCC/SCC  Social: Architect         Review of Systems   Constitutional: Negative for appetite change and unexpected weight change.   Gastrointestinal: Negative for diarrhea, nausea and vomiting.         Objective:         ? atorvastatin (LIPITOR) 40 mg tablet Take one tablet by mouth daily.   ? Blood-Glucose Sensor (DEXCOM G6 SENSOR) devi Use 1 each as directed as directed. Change every 10 days   ? budesonide (ENTOCORT EC) 3 mg capsule Take two capsules by mouth daily. Indications: Crohn's disease   ? bumetanide (BUMEX) 1 mg tablet TAKE 1 TABLET BY MOUTH EVERY DAY   ? buPROPion XL (WELLBUTRIN XL) 300 mg tablet Take one tablet by mouth every morning. Do not crush or chew.   ? cyclobenzaprine (FLEXERIL) 10 mg tablet Take 10 mg by mouth daily as needed. .   ? DEXCOM G6 RECEIVER misc Use 1 each as directed daily.   ? DEXCOM G6 TRANSMITTER devi Use 1 each as directed as directed. Every 90 days   ? dexlansoprazole (DEXILANT) 60 mg capsule Take one capsule by mouth daily. Indications: gastroesophageal reflux disease   ? dicyclomine (BENTYL) 20 mg tablet Take one tablet by mouth every 6 hours as needed.   ? duloxetine DR (CYMBALTA) 60 mg capsule Take 60 mg by mouth daily with breakfast.   ? fluticasone propionate (FLONASE) 50 mcg/actuation nasal spray, suspension Apply one spray to each nostril as directed daily. Shake bottle gently before using.   ? HUMULIN R U-500 (CONC) KWIKPEN 500 unit/mL (3 mL) injection PEN Take 310 units before breakfast, 250 units before lunch, and 125 units before dinner. Take about 20 minutes prior to the meal   ? insulin pen needles (disposable) (B-D NANO) 32 gauge x 5/32 pen needle Use one each as directed three times daily before meals. Use with insulin injections.   ? nebivolol (BYSTOLIC) 10 mg tablet Take one tablet by mouth daily.   ? spironolactone (ALDACTONE) 50 mg tablet Take 50 mg by mouth daily.   ? temazepam (RESTORIL) 15 mg capsule Take one capsule by mouth at bedtime as needed.   ? traMADoL (ULTRAM) 50 mg tablet Take one tablet by mouth every 6 hours as needed for Pain.   ? vedolizumab (ENTYVIO IV) Administer  through vein.     There were no vitals filed for this visit.  There is no height or weight on  file to calculate BMI.     Physical Exam    Areas Examined (all normal unless noted below):  Head/Face  Neck  Chest/breasts/axillae  Back  Abdomen  Buttocks/groin/genitalia   R upper ext  L upper ext  R lower ext  L lower ext  ?  Pertinent findings include:  Multiple brown and tan evenly pigmented macules are distributed over the head, neck,?trunk, arms and legs.??All have symmetric similar dermascopic findings with primarily globular?and reticular patterns.    NUO A: L upper inner thigh with dome-shaped slightly verrucous papule with central crust       Assessment and Plan:    # Melanocytic nevi  - Will cont to monitor  - RTC for new/changing lesions  - provided patient with information on ABCD's of melanoma and photoprotection, and vitamin D  ?  # History of Actinic Keratosis  - no active lesions noted today  - sunprotection advised  ?  # Immunosuppression  - discussed increased risk of skin cancers  - discussed sun protection    # NUO A) L upper inner thigh  - ddx: VV vs KA  - shave biopsy performed today   - will follow up results with the patient, contact information confirmed  - discussed wound care with the patient today  - photodocumentation completed and placed in the chart       RTC in 12 months or sooner prn

## 2019-06-29 DIAGNOSIS — Z1389 Encounter for screening for other disorder: Secondary | ICD-10-CM

## 2019-06-30 ENCOUNTER — Encounter: Admit: 2019-06-30 | Discharge: 2019-06-30 | Payer: BC Managed Care – PPO

## 2019-07-01 ENCOUNTER — Ambulatory Visit: Admit: 2019-07-01 | Discharge: 2019-07-01 | Payer: MEDICARE

## 2019-07-01 ENCOUNTER — Encounter: Admit: 2019-07-01 | Discharge: 2019-07-01 | Payer: BC Managed Care – PPO

## 2019-07-02 ENCOUNTER — Encounter: Admit: 2019-07-02 | Discharge: 2019-07-02 | Payer: BC Managed Care – PPO

## 2019-07-02 ENCOUNTER — Ambulatory Visit: Admit: 2019-07-02 | Discharge: 2019-07-03 | Payer: MEDICARE

## 2019-07-02 DIAGNOSIS — G40909 Epilepsy, unspecified, not intractable, without status epilepticus: Secondary | ICD-10-CM

## 2019-07-02 DIAGNOSIS — D4959 Neoplasm of unspecified behavior of other genitourinary organ: Secondary | ICD-10-CM

## 2019-07-02 DIAGNOSIS — K7581 Nonalcoholic steatohepatitis (NASH): Secondary | ICD-10-CM

## 2019-07-02 DIAGNOSIS — D508 Other iron deficiency anemias: Secondary | ICD-10-CM

## 2019-07-02 DIAGNOSIS — E119 Type 2 diabetes mellitus without complications: Secondary | ICD-10-CM

## 2019-07-02 DIAGNOSIS — IMO0002 Ulcer: Secondary | ICD-10-CM

## 2019-07-02 DIAGNOSIS — E139 Other specified diabetes mellitus without complications: Secondary | ICD-10-CM

## 2019-07-02 DIAGNOSIS — R42 Dizziness and giddiness: Secondary | ICD-10-CM

## 2019-07-02 DIAGNOSIS — D699 Hemorrhagic condition, unspecified: Secondary | ICD-10-CM

## 2019-07-02 DIAGNOSIS — M797 Fibromyalgia: Secondary | ICD-10-CM

## 2019-07-02 DIAGNOSIS — I8290 Acute embolism and thrombosis of unspecified vein: Secondary | ICD-10-CM

## 2019-07-02 DIAGNOSIS — K5090 Crohn's disease, unspecified, without complications: Secondary | ICD-10-CM

## 2019-07-02 DIAGNOSIS — I10 Essential (primary) hypertension: Secondary | ICD-10-CM

## 2019-07-02 DIAGNOSIS — G43909 Migraine, unspecified, not intractable, without status migrainosus: Secondary | ICD-10-CM

## 2019-07-02 DIAGNOSIS — J302 Other seasonal allergic rhinitis: Secondary | ICD-10-CM

## 2019-07-02 DIAGNOSIS — K219 Gastro-esophageal reflux disease without esophagitis: Secondary | ICD-10-CM

## 2019-07-02 DIAGNOSIS — K639 Disease of intestine, unspecified: Secondary | ICD-10-CM

## 2019-07-02 DIAGNOSIS — L299 Pruritus, unspecified: Secondary | ICD-10-CM

## 2019-07-02 DIAGNOSIS — M359 Systemic involvement of connective tissue, unspecified: Secondary | ICD-10-CM

## 2019-07-02 MED ORDER — DIPHENHYDRAMINE HCL 25 MG PO CAP
25 mg | Freq: Once | ORAL | 0 refills | Status: AC
Start: 2019-07-02 — End: ?

## 2019-07-02 MED ORDER — DIPHENHYDRAMINE HCL 25 MG PO CAP
25 mg | Freq: Once | ORAL | 0 refills | Status: CN
Start: 2019-07-02 — End: ?

## 2019-07-02 MED ORDER — FERRIC CARBOXYMALTOSE IVPB
750 mg | Freq: Once | INTRAVENOUS | 0 refills | Status: CN
Start: 2019-07-02 — End: ?

## 2019-07-02 MED ORDER — FERRIC CARBOXYMALTOSE IVPB
750 mg | Freq: Once | INTRAVENOUS | 0 refills | Status: CP
Start: 2019-07-02 — End: ?
  Administered 2019-07-02 (×2): 750 mg via INTRAVENOUS

## 2019-07-02 MED ORDER — DIPHENHYDRAMINE HCL 50 MG/ML IJ SOLN
25 mg | Freq: Once | INTRAVENOUS | 0 refills | Status: CN
Start: 2019-07-02 — End: ?

## 2019-07-02 MED ORDER — DIPHENHYDRAMINE HCL 25 MG PO CAP
25 mg | Freq: Once | ORAL | 0 refills | Status: CP | PRN
Start: 2019-07-02 — End: ?
  Administered 2019-07-02: 22:00:00 25 mg via ORAL

## 2019-07-02 MED ORDER — VEDOLIZUMAB IVPB
300 mg | Freq: Once | INTRAVENOUS | 0 refills | Status: CN
Start: 2019-07-02 — End: ?

## 2019-07-02 MED ORDER — ACETAMINOPHEN 500 MG PO TAB
500 mg | Freq: Once | ORAL | 0 refills | Status: CN
Start: 2019-07-02 — End: ?

## 2019-07-02 MED ADMIN — DIPHENHYDRAMINE HCL 50 MG/ML IJ SOLN [2508]: 50 mg | INTRAVENOUS | @ 23:00:00 | Stop: 2019-07-02 | NDC 00641037621

## 2019-07-02 MED ADMIN — DIPHENHYDRAMINE HCL 50 MG/ML IJ SOLN [2508]: 50 mg | INTRAVENOUS | @ 22:00:00 | Stop: 2019-07-02 | NDC 00641037621

## 2019-07-02 MED ADMIN — FAMOTIDINE (PF) 20 MG/2 ML IV SOLN [166077]: 20 mg | INTRAVENOUS | @ 22:00:00 | Stop: 2019-07-02 | NDC 00641602201

## 2019-07-02 NOTE — Progress Notes
Patient here for Injectafer infusion. At 1539, patient exhibited itching on neck and face. Denied shortness of air. Benadryl 71m IV and Pepcid IV given per infusion reaciton protocol. Patient verbalized desire to finish infusion. Dr, BPatsey Bertholdnotified, agreed to let patient finish infusion.     1621 restarted Injectafer infusion but stopped at 1622 due to more itching on neck and face. Another dose of Benadryl 50 mg IV given. Dr. BPatsey Bertholdwas again notified and received order to stop infusion. Patient was discharged after signs and symptoms of allergic reaction have cleared. VSS.

## 2019-07-03 DIAGNOSIS — K50918 Crohn's disease, unspecified, with other complication: Principal | ICD-10-CM

## 2019-07-05 ENCOUNTER — Encounter: Admit: 2019-07-05 | Discharge: 2019-07-05 | Payer: BC Managed Care – PPO

## 2019-07-06 ENCOUNTER — Encounter: Admit: 2019-07-06 | Discharge: 2019-07-06 | Payer: BC Managed Care – PPO

## 2019-07-06 ENCOUNTER — Ambulatory Visit: Admit: 2019-07-06 | Discharge: 2019-07-06 | Payer: MEDICARE

## 2019-07-06 DIAGNOSIS — G43909 Migraine, unspecified, not intractable, without status migrainosus: Secondary | ICD-10-CM

## 2019-07-06 DIAGNOSIS — J302 Other seasonal allergic rhinitis: Secondary | ICD-10-CM

## 2019-07-06 DIAGNOSIS — E139 Other specified diabetes mellitus without complications: Secondary | ICD-10-CM

## 2019-07-06 DIAGNOSIS — IMO0002 Ulcer: Secondary | ICD-10-CM

## 2019-07-06 DIAGNOSIS — D4959 Neoplasm of unspecified behavior of other genitourinary organ: Secondary | ICD-10-CM

## 2019-07-06 DIAGNOSIS — M797 Fibromyalgia: Secondary | ICD-10-CM

## 2019-07-06 DIAGNOSIS — K639 Disease of intestine, unspecified: Secondary | ICD-10-CM

## 2019-07-06 DIAGNOSIS — K5090 Crohn's disease, unspecified, without complications: Secondary | ICD-10-CM

## 2019-07-06 DIAGNOSIS — G40909 Epilepsy, unspecified, not intractable, without status epilepticus: Secondary | ICD-10-CM

## 2019-07-06 DIAGNOSIS — H6521 Chronic serous otitis media, right ear: Secondary | ICD-10-CM

## 2019-07-06 DIAGNOSIS — E119 Type 2 diabetes mellitus without complications: Secondary | ICD-10-CM

## 2019-07-06 DIAGNOSIS — R42 Dizziness and giddiness: Secondary | ICD-10-CM

## 2019-07-06 DIAGNOSIS — K7581 Nonalcoholic steatohepatitis (NASH): Secondary | ICD-10-CM

## 2019-07-06 DIAGNOSIS — M359 Systemic involvement of connective tissue, unspecified: Secondary | ICD-10-CM

## 2019-07-06 DIAGNOSIS — K219 Gastro-esophageal reflux disease without esophagitis: Secondary | ICD-10-CM

## 2019-07-06 DIAGNOSIS — I10 Essential (primary) hypertension: Secondary | ICD-10-CM

## 2019-07-06 DIAGNOSIS — I8290 Acute embolism and thrombosis of unspecified vein: Secondary | ICD-10-CM

## 2019-07-06 DIAGNOSIS — D699 Hemorrhagic condition, unspecified: Secondary | ICD-10-CM

## 2019-07-06 NOTE — Progress Notes
Date of Service: 07/06/2019    Subjective:             Tina Snyder is a 54 y.o. female.    History of Present Illness    Follow up chronic serous otitis media. S/p right myringotomy in January.  Symptoms recurred within a few weeks of her myringotomy.         Review of Systems   Constitutional: Negative.    HENT: Positive for hearing loss and tinnitus.    Eyes: Negative.    Respiratory: Negative.    Cardiovascular: Negative.    Gastrointestinal: Negative.    Endocrine: Negative.    Genitourinary: Negative.    Musculoskeletal: Negative.    Skin: Negative.    Allergic/Immunologic: Negative.    Neurological: Negative.    Hematological: Negative.    Psychiatric/Behavioral: Negative.          Objective:         ? atorvastatin (LIPITOR) 40 mg tablet Take one tablet by mouth daily.   ? Blood-Glucose Sensor (DEXCOM G6 SENSOR) devi Use 1 each as directed as directed. Change every 10 days   ? budesonide (ENTOCORT EC) 3 mg capsule Take two capsules by mouth daily. Indications: Crohn's disease   ? bumetanide (BUMEX) 1 mg tablet TAKE 1 TABLET BY MOUTH EVERY DAY   ? buPROPion XL (WELLBUTRIN XL) 300 mg tablet Take one tablet by mouth every morning. Do not crush or chew.   ? cyclobenzaprine (FLEXERIL) 10 mg tablet Take 10 mg by mouth daily as needed. .   ? DEXCOM G6 RECEIVER misc Use 1 each as directed daily.   ? DEXCOM G6 TRANSMITTER devi Use 1 each as directed as directed. Every 90 days   ? dexlansoprazole (DEXILANT) 60 mg capsule Take one capsule by mouth daily. Indications: gastroesophageal reflux disease   ? dicyclomine (BENTYL) 20 mg tablet Take one tablet by mouth every 6 hours as needed.   ? duloxetine DR (CYMBALTA) 60 mg capsule Take 60 mg by mouth daily with breakfast.   ? fluticasone propionate (FLONASE) 50 mcg/actuation nasal spray, suspension Apply one spray to each nostril as directed daily. Shake bottle gently before using.   ? HUMULIN R U-500 (CONC) KWIKPEN 500 unit/mL (3 mL) injection PEN Take 310 units before breakfast, 250 units before lunch, and 125 units before dinner. Take about 20 minutes prior to the meal   ? insulin pen needles (disposable) (B-D NANO) 32 gauge x 5/32 pen needle Use one each as directed three times daily before meals. Use with insulin injections.   ? nebivolol (BYSTOLIC) 10 mg tablet Take one tablet by mouth daily.   ? spironolactone (ALDACTONE) 50 mg tablet Take 50 mg by mouth daily.   ? sucralfate (CARAFATE) 1 gram tablet Take one tablet by mouth three times daily as needed. Take on an empty stomach. May crush and add water to make slurry   ? temazepam (RESTORIL) 15 mg capsule Take one capsule by mouth at bedtime as needed.   ? traMADoL (ULTRAM) 50 mg tablet Take one tablet by mouth every 6 hours as needed for Pain.   ? vedolizumab (ENTYVIO IV) Administer  through vein.     Vitals:    07/06/19 1022   Weight: 103.9 kg (229 lb)   Height: 162.6 cm (64)   PainSc: Zero     Body mass index is 39.31 kg/m?Marland Kitchen     Physical Exam    General   Well-developed, well-nourished   Communication and  Voice:  Clear pitch and clarity, age appropriate   Head and Face   Inspection:  Normocephalic and atraumatic without masses or lesions  ENT   External nose:  No scar or anatomic deformity  Ear   External canal: Right - Canal is patent with intact skin                                Tympanic Membranes:  Right - Clear with recurrent effusion    Respiratory   Respiratory effort:  Equal inspiration & expiration without use of accessory muscles. No  stridor  Neuro/Psych/Balance   Orientation: Patient oriented to person, place, and time   Affect: Appropriate mood and affect       Assessment and Plan:    Chronic right serous otitis media.  Plan for right ear tube placement.  Patient will schedule the procedure today.

## 2019-07-07 ENCOUNTER — Encounter: Admit: 2019-07-07 | Discharge: 2019-07-07 | Payer: BC Managed Care – PPO

## 2019-07-09 ENCOUNTER — Encounter: Admit: 2019-07-09 | Discharge: 2019-07-09 | Payer: BC Managed Care – PPO

## 2019-07-09 ENCOUNTER — Ambulatory Visit: Admit: 2019-07-09 | Discharge: 2019-07-10 | Payer: MEDICARE

## 2019-07-09 DIAGNOSIS — IMO0002 Ulcer: Secondary | ICD-10-CM

## 2019-07-09 DIAGNOSIS — D508 Other iron deficiency anemias: Secondary | ICD-10-CM

## 2019-07-09 DIAGNOSIS — I8290 Acute embolism and thrombosis of unspecified vein: Secondary | ICD-10-CM

## 2019-07-09 DIAGNOSIS — J302 Other seasonal allergic rhinitis: Secondary | ICD-10-CM

## 2019-07-09 DIAGNOSIS — E139 Other specified diabetes mellitus without complications: Secondary | ICD-10-CM

## 2019-07-09 DIAGNOSIS — I10 Essential (primary) hypertension: Secondary | ICD-10-CM

## 2019-07-09 DIAGNOSIS — E119 Type 2 diabetes mellitus without complications: Secondary | ICD-10-CM

## 2019-07-09 DIAGNOSIS — K5090 Crohn's disease, unspecified, without complications: Secondary | ICD-10-CM

## 2019-07-09 DIAGNOSIS — M797 Fibromyalgia: Secondary | ICD-10-CM

## 2019-07-09 DIAGNOSIS — K219 Gastro-esophageal reflux disease without esophagitis: Secondary | ICD-10-CM

## 2019-07-09 DIAGNOSIS — D699 Hemorrhagic condition, unspecified: Secondary | ICD-10-CM

## 2019-07-09 DIAGNOSIS — K639 Disease of intestine, unspecified: Secondary | ICD-10-CM

## 2019-07-09 DIAGNOSIS — D4959 Neoplasm of unspecified behavior of other genitourinary organ: Secondary | ICD-10-CM

## 2019-07-09 DIAGNOSIS — G40909 Epilepsy, unspecified, not intractable, without status epilepticus: Secondary | ICD-10-CM

## 2019-07-09 DIAGNOSIS — R42 Dizziness and giddiness: Secondary | ICD-10-CM

## 2019-07-09 DIAGNOSIS — G43909 Migraine, unspecified, not intractable, without status migrainosus: Secondary | ICD-10-CM

## 2019-07-09 DIAGNOSIS — M359 Systemic involvement of connective tissue, unspecified: Secondary | ICD-10-CM

## 2019-07-09 DIAGNOSIS — K7581 Nonalcoholic steatohepatitis (NASH): Secondary | ICD-10-CM

## 2019-07-09 MED ORDER — ACETAMINOPHEN 500 MG PO TAB
500 mg | Freq: Once | ORAL | 0 refills | Status: CN
Start: 2019-07-09 — End: ?

## 2019-07-09 MED ORDER — METHYLPREDNISOLONE SOD SUC(PF) 125 MG/2 ML IJ SOLR
40 mg | Freq: Once | INTRAVENOUS | 0 refills | Status: CP
Start: 2019-07-09 — End: ?
  Administered 2019-07-09: 20:00:00 40 mg via INTRAVENOUS

## 2019-07-09 MED ORDER — FAMOTIDINE (PF) 20 MG/2 ML IV SOLN
20 mg | Freq: Once | INTRAVENOUS | 0 refills | Status: CP
Start: 2019-07-09 — End: ?
  Administered 2019-07-09: 20:00:00 20 mg via INTRAVENOUS

## 2019-07-09 MED ORDER — DIPHENHYDRAMINE HCL 50 MG/ML IJ SOLN
50 mg | Freq: Once | INTRAVENOUS | 0 refills | Status: CP
Start: 2019-07-09 — End: ?
  Administered 2019-07-09: 20:00:00 50 mg via INTRAVENOUS

## 2019-07-09 MED ORDER — FERRIC CARBOXYMALTOSE IVPB
750 mg | Freq: Once | INTRAVENOUS | 0 refills | Status: CP
Start: 2019-07-09 — End: ?
  Administered 2019-07-09 (×2): 750 mg via INTRAVENOUS

## 2019-07-09 MED ORDER — DIPHENHYDRAMINE HCL 25 MG PO CAP
25 mg | Freq: Once | ORAL | 0 refills | Status: CP | PRN
Start: 2019-07-09 — End: ?
  Administered 2019-07-09: 20:00:00 25 mg via ORAL

## 2019-07-09 MED ORDER — VEDOLIZUMAB IVPB
300 mg | Freq: Once | INTRAVENOUS | 0 refills | Status: CN
Start: 2019-07-09 — End: ?

## 2019-07-09 MED ORDER — DOXYCYCLINE HYCLATE 100 MG PO CAP
100 mg | ORAL_CAPSULE | Freq: Two times a day (BID) | ORAL | 0 refills | 8.00000 days | Status: AC
Start: 2019-07-09 — End: ?

## 2019-07-09 MED ORDER — DIPHENHYDRAMINE HCL 50 MG/ML IJ SOLN
25 mg | Freq: Once | INTRAVENOUS | 0 refills | Status: CN
Start: 2019-07-09 — End: ?

## 2019-07-09 MED ORDER — FERRIC CARBOXYMALTOSE IVPB
750 mg | Freq: Once | INTRAVENOUS | 0 refills | Status: CN
Start: 2019-07-09 — End: ?

## 2019-07-09 MED ORDER — DIPHENHYDRAMINE HCL 25 MG PO CAP
25 mg | Freq: Once | ORAL | 0 refills | Status: CN
Start: 2019-07-09 — End: ?

## 2019-07-10 DIAGNOSIS — K50918 Crohn's disease, unspecified, with other complication: Principal | ICD-10-CM

## 2019-07-12 ENCOUNTER — Encounter: Admit: 2019-07-12 | Discharge: 2019-07-12 | Payer: BC Managed Care – PPO

## 2019-07-12 DIAGNOSIS — K50018 Crohn's disease of small intestine with other complication: Secondary | ICD-10-CM

## 2019-07-12 MED ORDER — DICYCLOMINE 20 MG PO TAB
ORAL_TABLET | Freq: Four times a day (QID) | 3 refills | Status: AC | PRN
Start: 2019-07-12 — End: ?

## 2019-07-20 ENCOUNTER — Encounter: Admit: 2019-07-20 | Discharge: 2019-07-20 | Payer: BC Managed Care – PPO

## 2019-07-23 ENCOUNTER — Encounter: Admit: 2019-07-23 | Discharge: 2019-07-23 | Payer: BC Managed Care – PPO

## 2019-07-23 NOTE — Telephone Encounter
Called pt to reschedule ear tube placement appt with Dr. Unknown Foley. Pt cancelled due to post COVID vaccine side effect. Appointment rescheduled.

## 2019-07-25 ENCOUNTER — Encounter: Admit: 2019-07-25 | Discharge: 2019-07-25 | Payer: BC Managed Care – PPO

## 2019-07-26 ENCOUNTER — Encounter: Admit: 2019-07-26 | Discharge: 2019-07-26 | Payer: BC Managed Care – PPO

## 2019-07-29 ENCOUNTER — Encounter: Admit: 2019-07-29 | Discharge: 2019-07-29 | Payer: BC Managed Care – PPO

## 2019-07-29 ENCOUNTER — Ambulatory Visit: Admit: 2019-07-29 | Discharge: 2019-07-30 | Payer: MEDICARE

## 2019-07-29 MED ORDER — ACETAMINOPHEN 500 MG PO TAB
500 mg | Freq: Once | ORAL | 0 refills | Status: CN
Start: 2019-07-29 — End: ?

## 2019-07-29 MED ORDER — DIPHENHYDRAMINE HCL 50 MG/ML IJ SOLN
25-50 mg | Freq: Once | INTRAVENOUS | 0 refills | Status: CN
Start: 2019-07-29 — End: ?

## 2019-07-29 MED ORDER — VEDOLIZUMAB IVPB
300 mg | Freq: Once | INTRAVENOUS | 0 refills | Status: CN
Start: 2019-07-29 — End: ?

## 2019-07-29 MED ORDER — VEDOLIZUMAB IVPB
300 mg | Freq: Once | INTRAVENOUS | 0 refills | Status: CP
Start: 2019-07-29 — End: ?
  Administered 2019-07-29 (×2): 300 mg via INTRAVENOUS

## 2019-07-29 MED ORDER — DIPHENHYDRAMINE HCL 50 MG/ML IJ SOLN
25 mg | Freq: Once | INTRAVENOUS | 0 refills | Status: CP
Start: 2019-07-29 — End: ?
  Administered 2019-07-29: 20:00:00 25 mg via INTRAVENOUS

## 2019-07-29 MED ORDER — DIPHENHYDRAMINE HCL 50 MG/ML IJ SOLN
25 mg | Freq: Once | INTRAVENOUS | 0 refills | Status: CP
Start: 2019-07-29 — End: ?
  Administered 2019-07-29: 19:00:00 25 mg via INTRAVENOUS

## 2019-07-29 MED ORDER — DIPHENHYDRAMINE HCL 50 MG/ML IJ SOLN
25 mg | Freq: Once | INTRAVENOUS | 0 refills | Status: CN
Start: 2019-07-29 — End: ?

## 2019-07-29 MED ORDER — DIPHENHYDRAMINE HCL 25 MG PO CAP
25 mg | Freq: Once | ORAL | 0 refills | Status: DC
Start: 2019-07-29 — End: 2019-07-29

## 2019-07-29 MED ORDER — DIPHENHYDRAMINE HCL 25 MG PO CAP
25 mg | Freq: Once | ORAL | 0 refills | Status: CN
Start: 2019-07-29 — End: ?

## 2019-07-29 MED ORDER — ACETAMINOPHEN 500 MG PO TAB
500 mg | Freq: Once | ORAL | 0 refills | Status: CP
Start: 2019-07-29 — End: ?
  Administered 2019-07-29: 19:00:00 500 mg via ORAL

## 2019-07-30 ENCOUNTER — Encounter: Admit: 2019-07-30 | Discharge: 2019-07-30 | Payer: BC Managed Care – PPO

## 2019-07-30 NOTE — Telephone Encounter
Patient viewed in [place] results letter to patient.

## 2019-07-30 NOTE — Telephone Encounter
-----   Message from Viviann Spare, MD sent at 07/30/2019  7:26 AM CDT -----  Labs improving

## 2019-08-01 ENCOUNTER — Encounter: Admit: 2019-08-01 | Discharge: 2019-08-01 | Payer: BC Managed Care – PPO

## 2019-08-02 ENCOUNTER — Encounter: Admit: 2019-08-02 | Discharge: 2019-08-02 | Payer: BC Managed Care – PPO

## 2019-08-04 ENCOUNTER — Encounter: Admit: 2019-08-04 | Discharge: 2019-08-04 | Payer: BC Managed Care – PPO

## 2019-08-09 NOTE — Telephone Encounter
Called pt back to reschedule appointment with Dr. Jeanella Craze tomorrow. No answer, LVM.

## 2019-08-09 NOTE — Telephone Encounter
Pt called in to reschedule her in office procedure due to having a fever.

## 2019-08-23 ENCOUNTER — Encounter: Admit: 2019-08-23 | Discharge: 2019-08-23 | Payer: BC Managed Care – PPO

## 2019-08-30 ENCOUNTER — Encounter: Admit: 2019-08-30 | Discharge: 2019-08-30 | Payer: BC Managed Care – PPO

## 2019-08-31 ENCOUNTER — Ambulatory Visit: Admit: 2019-08-31 | Discharge: 2019-09-01 | Payer: MEDICARE

## 2019-08-31 ENCOUNTER — Encounter: Admit: 2019-08-31 | Discharge: 2019-08-31 | Payer: BC Managed Care – PPO

## 2019-08-31 DIAGNOSIS — G43909 Migraine, unspecified, not intractable, without status migrainosus: Secondary | ICD-10-CM

## 2019-08-31 DIAGNOSIS — R42 Dizziness and giddiness: Secondary | ICD-10-CM

## 2019-08-31 DIAGNOSIS — K7581 Nonalcoholic steatohepatitis (NASH): Secondary | ICD-10-CM

## 2019-08-31 DIAGNOSIS — M359 Systemic involvement of connective tissue, unspecified: Secondary | ICD-10-CM

## 2019-08-31 DIAGNOSIS — D4959 Neoplasm of unspecified behavior of other genitourinary organ: Secondary | ICD-10-CM

## 2019-08-31 DIAGNOSIS — D699 Hemorrhagic condition, unspecified: Secondary | ICD-10-CM

## 2019-08-31 DIAGNOSIS — M797 Fibromyalgia: Secondary | ICD-10-CM

## 2019-08-31 DIAGNOSIS — K219 Gastro-esophageal reflux disease without esophagitis: Secondary | ICD-10-CM

## 2019-08-31 DIAGNOSIS — I10 Essential (primary) hypertension: Secondary | ICD-10-CM

## 2019-08-31 DIAGNOSIS — K639 Disease of intestine, unspecified: Secondary | ICD-10-CM

## 2019-08-31 DIAGNOSIS — G40909 Epilepsy, unspecified, not intractable, without status epilepticus: Secondary | ICD-10-CM

## 2019-08-31 DIAGNOSIS — E139 Other specified diabetes mellitus without complications: Secondary | ICD-10-CM

## 2019-08-31 DIAGNOSIS — E119 Type 2 diabetes mellitus without complications: Secondary | ICD-10-CM

## 2019-08-31 DIAGNOSIS — IMO0002 Ulcer: Secondary | ICD-10-CM

## 2019-08-31 DIAGNOSIS — I8290 Acute embolism and thrombosis of unspecified vein: Secondary | ICD-10-CM

## 2019-08-31 DIAGNOSIS — K5090 Crohn's disease, unspecified, without complications: Secondary | ICD-10-CM

## 2019-08-31 DIAGNOSIS — J302 Other seasonal allergic rhinitis: Secondary | ICD-10-CM

## 2019-08-31 NOTE — Patient Instructions
Our plan:       My nurse, Jan, can be reached at [phone].     Please let us know if you have any questions.     Great to see you today!    - Scarleth Brame Kingsley, PA-C

## 2019-09-01 DIAGNOSIS — E119 Type 2 diabetes mellitus without complications: Secondary | ICD-10-CM

## 2019-09-01 DIAGNOSIS — Z794 Long term (current) use of insulin: Secondary | ICD-10-CM

## 2019-09-02 ENCOUNTER — Encounter: Admit: 2019-09-02 | Discharge: 2019-09-02 | Payer: BC Managed Care – PPO

## 2019-09-02 DIAGNOSIS — I10 Essential (primary) hypertension: Secondary | ICD-10-CM

## 2019-09-02 DIAGNOSIS — G43909 Migraine, unspecified, not intractable, without status migrainosus: Secondary | ICD-10-CM

## 2019-09-02 DIAGNOSIS — IMO0002 Ulcer: Secondary | ICD-10-CM

## 2019-09-02 DIAGNOSIS — E119 Type 2 diabetes mellitus without complications: Secondary | ICD-10-CM

## 2019-09-02 DIAGNOSIS — G40909 Epilepsy, unspecified, not intractable, without status epilepticus: Secondary | ICD-10-CM

## 2019-09-02 DIAGNOSIS — R42 Dizziness and giddiness: Secondary | ICD-10-CM

## 2019-09-02 DIAGNOSIS — D699 Hemorrhagic condition, unspecified: Secondary | ICD-10-CM

## 2019-09-02 DIAGNOSIS — M359 Systemic involvement of connective tissue, unspecified: Secondary | ICD-10-CM

## 2019-09-02 DIAGNOSIS — D4959 Neoplasm of unspecified behavior of other genitourinary organ: Secondary | ICD-10-CM

## 2019-09-02 DIAGNOSIS — J302 Other seasonal allergic rhinitis: Secondary | ICD-10-CM

## 2019-09-02 DIAGNOSIS — K639 Disease of intestine, unspecified: Secondary | ICD-10-CM

## 2019-09-02 DIAGNOSIS — K219 Gastro-esophageal reflux disease without esophagitis: Secondary | ICD-10-CM

## 2019-09-02 DIAGNOSIS — I8290 Acute embolism and thrombosis of unspecified vein: Secondary | ICD-10-CM

## 2019-09-02 DIAGNOSIS — K7581 Nonalcoholic steatohepatitis (NASH): Secondary | ICD-10-CM

## 2019-09-02 DIAGNOSIS — K5090 Crohn's disease, unspecified, without complications: Secondary | ICD-10-CM

## 2019-09-02 DIAGNOSIS — E139 Other specified diabetes mellitus without complications: Secondary | ICD-10-CM

## 2019-09-02 DIAGNOSIS — M797 Fibromyalgia: Secondary | ICD-10-CM

## 2019-09-07 ENCOUNTER — Encounter: Admit: 2019-09-07 | Discharge: 2019-09-07 | Payer: BC Managed Care – PPO

## 2019-09-07 ENCOUNTER — Ambulatory Visit: Admit: 2019-09-07 | Discharge: 2019-09-08 | Payer: MEDICARE

## 2019-09-07 DIAGNOSIS — J302 Other seasonal allergic rhinitis: Secondary | ICD-10-CM

## 2019-09-07 DIAGNOSIS — R42 Dizziness and giddiness: Secondary | ICD-10-CM

## 2019-09-07 DIAGNOSIS — K7581 Nonalcoholic steatohepatitis (NASH): Secondary | ICD-10-CM

## 2019-09-07 DIAGNOSIS — D699 Hemorrhagic condition, unspecified: Secondary | ICD-10-CM

## 2019-09-07 DIAGNOSIS — G43909 Migraine, unspecified, not intractable, without status migrainosus: Secondary | ICD-10-CM

## 2019-09-07 DIAGNOSIS — G40909 Epilepsy, unspecified, not intractable, without status epilepticus: Secondary | ICD-10-CM

## 2019-09-07 DIAGNOSIS — E139 Other specified diabetes mellitus without complications: Secondary | ICD-10-CM

## 2019-09-07 DIAGNOSIS — D4959 Neoplasm of unspecified behavior of other genitourinary organ: Secondary | ICD-10-CM

## 2019-09-07 DIAGNOSIS — K5090 Crohn's disease, unspecified, without complications: Secondary | ICD-10-CM

## 2019-09-07 DIAGNOSIS — E119 Type 2 diabetes mellitus without complications: Secondary | ICD-10-CM

## 2019-09-07 DIAGNOSIS — I8290 Acute embolism and thrombosis of unspecified vein: Secondary | ICD-10-CM

## 2019-09-07 DIAGNOSIS — M797 Fibromyalgia: Secondary | ICD-10-CM

## 2019-09-07 DIAGNOSIS — M359 Systemic involvement of connective tissue, unspecified: Secondary | ICD-10-CM

## 2019-09-07 DIAGNOSIS — K639 Disease of intestine, unspecified: Secondary | ICD-10-CM

## 2019-09-07 DIAGNOSIS — IMO0002 Ulcer: Secondary | ICD-10-CM

## 2019-09-07 DIAGNOSIS — I10 Essential (primary) hypertension: Secondary | ICD-10-CM

## 2019-09-07 DIAGNOSIS — K219 Gastro-esophageal reflux disease without esophagitis: Secondary | ICD-10-CM

## 2019-09-07 NOTE — Procedures
See clinic notes for procedural details

## 2019-09-07 NOTE — Progress Notes
Date of Service: 09/07/2019    Subjective:             Tina Snyder is a 54 y.o. female.    History of Present Illness    Presents for right myringotomy and tube placement secondary to chronic right serous otitis media.       Review of Systems   Constitutional: Negative.    HENT: Negative.    Eyes: Negative.    Respiratory: Negative.    Cardiovascular: Negative.    Gastrointestinal: Negative.    Endocrine: Negative.    Genitourinary: Negative.    Musculoskeletal: Negative.    Skin: Negative.    Allergic/Immunologic: Negative.    Neurological: Negative.    Hematological: Negative.    Psychiatric/Behavioral: Negative.          Objective:         ? atorvastatin (LIPITOR) 40 mg tablet Take one tablet by mouth daily.   ? Blood-Glucose Sensor (DEXCOM G6 SENSOR) devi Use 1 each as directed as directed. Change every 10 days   ? budesonide (ENTOCORT EC) 3 mg capsule Take two capsules by mouth daily. Indications: Crohn's disease   ? bumetanide (BUMEX) 1 mg tablet TAKE 1 TABLET BY MOUTH EVERY DAY   ? buPROPion XL (WELLBUTRIN XL) 300 mg tablet Take one tablet by mouth every morning. Do not crush or chew.   ? cyclobenzaprine (FLEXERIL) 10 mg tablet Take 10 mg by mouth daily as needed. .   ? DEXCOM G6 RECEIVER misc Use 1 each as directed daily.   ? DEXCOM G6 TRANSMITTER devi Use 1 each as directed as directed. Every 90 days   ? dexlansoprazole (DEXILANT) 60 mg capsule Take one capsule by mouth daily. Indications: gastroesophageal reflux disease   ? dicyclomine (BENTYL) 20 mg tablet TAKE 1 TABLET BY MOUTH EVERY 6 HOURS AS NEEDED   ? duloxetine DR (CYMBALTA) 60 mg capsule Take 60 mg by mouth daily with breakfast.   ? fluticasone propionate (FLONASE) 50 mcg/actuation nasal spray, suspension Apply one spray to each nostril as directed daily. Shake bottle gently before using.   ? HUMULIN R U-500 (CONC) KWIKPEN 500 unit/mL (3 mL) injection PEN Take 310 units before breakfast, 250 units before lunch, and 125 units before dinner. Take about 20 minutes prior to the meal   ? insulin pen needles (disposable) (B-D NANO) 32 gauge x 5/32 pen needle Use one each as directed three times daily before meals. Use with insulin injections.   ? nebivolol (BYSTOLIC) 10 mg tablet Take one tablet by mouth daily.   ? spironolactone (ALDACTONE) 50 mg tablet Take 50 mg by mouth daily.   ? sucralfate (CARAFATE) 1 gram tablet Take one tablet by mouth three times daily as needed. Take on an empty stomach. May crush and add water to make slurry   ? temazepam (RESTORIL) 15 mg capsule Take one capsule by mouth at bedtime as needed.   ? traMADoL (ULTRAM) 50 mg tablet Take one tablet by mouth every 6 hours as needed for Pain.   ? vedolizumab (ENTYVIO IV) Administer  through vein.     Vitals:    09/07/19 1043   Temp: 36.2 ?C (97.2 ?F)   Weight: 105.3 kg (232 lb 3.2 oz)   Height: 162.6 cm (64)   PainSc: Zero     Body mass index is 39.86 kg/m?Marland Kitchen     Physical Exam    After informed consent was obtained [place] was placed in the supine position.  Topical  4% lidocaine anesthesia was instilled in the right canal and the operating microscope brought into the field.  A standard timeout was performed and the patient was prepped in the usual fashion.  Under binocular microscopy the canal was cleared of cerumen using a curette.    Next a standard myringotomy was performed in the anterior inferior quadrant and thin serous secretions were suctioned from the middle ear space.  A standard collar button tube was inserted and antibiotic drops were instilled in the canal.  A cotton ball was placed in the meatus.         Assessment and Plan:  Right myringotomy and tube placed today without difficulty.  Follow up in 3 months to reassess.

## 2019-09-08 DIAGNOSIS — H6521 Chronic serous otitis media, right ear: Principal | ICD-10-CM

## 2019-09-23 ENCOUNTER — Encounter: Admit: 2019-09-23 | Discharge: 2019-09-23 | Payer: BC Managed Care – PPO

## 2019-09-23 ENCOUNTER — Ambulatory Visit: Admit: 2019-09-23 | Discharge: 2019-09-24 | Payer: BC Managed Care – PPO

## 2019-09-23 DIAGNOSIS — G40909 Epilepsy, unspecified, not intractable, without status epilepticus: Secondary | ICD-10-CM

## 2019-09-23 DIAGNOSIS — M359 Systemic involvement of connective tissue, unspecified: Secondary | ICD-10-CM

## 2019-09-23 DIAGNOSIS — K50918 Crohn's disease, unspecified, with other complication: Secondary | ICD-10-CM

## 2019-09-23 DIAGNOSIS — G43909 Migraine, unspecified, not intractable, without status migrainosus: Secondary | ICD-10-CM

## 2019-09-23 DIAGNOSIS — K219 Gastro-esophageal reflux disease without esophagitis: Secondary | ICD-10-CM

## 2019-09-23 DIAGNOSIS — I10 Essential (primary) hypertension: Secondary | ICD-10-CM

## 2019-09-23 DIAGNOSIS — M797 Fibromyalgia: Secondary | ICD-10-CM

## 2019-09-23 DIAGNOSIS — K5090 Crohn's disease, unspecified, without complications: Secondary | ICD-10-CM

## 2019-09-23 DIAGNOSIS — IMO0002 Ulcer: Secondary | ICD-10-CM

## 2019-09-23 DIAGNOSIS — I8290 Acute embolism and thrombosis of unspecified vein: Secondary | ICD-10-CM

## 2019-09-23 DIAGNOSIS — R42 Dizziness and giddiness: Secondary | ICD-10-CM

## 2019-09-23 DIAGNOSIS — E119 Type 2 diabetes mellitus without complications: Secondary | ICD-10-CM

## 2019-09-23 DIAGNOSIS — J302 Other seasonal allergic rhinitis: Secondary | ICD-10-CM

## 2019-09-23 DIAGNOSIS — K7581 Nonalcoholic steatohepatitis (NASH): Secondary | ICD-10-CM

## 2019-09-23 DIAGNOSIS — K639 Disease of intestine, unspecified: Secondary | ICD-10-CM

## 2019-09-23 DIAGNOSIS — D699 Hemorrhagic condition, unspecified: Secondary | ICD-10-CM

## 2019-09-23 DIAGNOSIS — E139 Other specified diabetes mellitus without complications: Secondary | ICD-10-CM

## 2019-09-23 DIAGNOSIS — D4959 Neoplasm of unspecified behavior of other genitourinary organ: Secondary | ICD-10-CM

## 2019-09-23 MED ORDER — DIPHENHYDRAMINE HCL 50 MG/ML IJ SOLN
25-50 mg | Freq: Once | INTRAVENOUS | 0 refills | Status: CN
Start: 2019-09-23 — End: ?

## 2019-09-23 MED ORDER — VEDOLIZUMAB IVPB
300 mg | Freq: Once | INTRAVENOUS | 0 refills | Status: CN
Start: 2019-09-23 — End: ?

## 2019-09-23 MED ORDER — ACETAMINOPHEN 500 MG PO TAB
500 mg | Freq: Once | ORAL | 0 refills | Status: CP
Start: 2019-09-23 — End: ?
  Administered 2019-09-23: 17:00:00 500 mg via ORAL

## 2019-09-23 MED ORDER — DIPHENHYDRAMINE HCL 50 MG/ML IJ SOLN
25-50 mg | Freq: Once | INTRAVENOUS | 0 refills | Status: CP
Start: 2019-09-23 — End: ?
  Administered 2019-09-23: 17:00:00 50 mg via INTRAVENOUS

## 2019-09-23 MED ORDER — ACETAMINOPHEN 500 MG PO TAB
500 mg | Freq: Once | ORAL | 0 refills | Status: CN
Start: 2019-09-23 — End: ?

## 2019-09-23 MED ORDER — VEDOLIZUMAB IVPB
300 mg | Freq: Once | INTRAVENOUS | 0 refills | Status: CP
Start: 2019-09-23 — End: ?
  Administered 2019-09-23 (×2): 300 mg via INTRAVENOUS

## 2019-09-23 MED ADMIN — DIPHENHYDRAMINE HCL 50 MG/ML IJ SOLN [2508]: 50 mg | INTRAVENOUS | @ 18:00:00 | Stop: 2019-09-23 | NDC 00641037621

## 2019-09-23 NOTE — Progress Notes
Patient tolerated Entyvio infusion; no reaction noted.

## 2019-09-28 ENCOUNTER — Encounter: Admit: 2019-09-28 | Discharge: 2019-09-28 | Payer: BC Managed Care – PPO

## 2019-09-28 DIAGNOSIS — K219 Gastro-esophageal reflux disease without esophagitis: Secondary | ICD-10-CM

## 2019-09-29 MED ORDER — DEXILANT 60 MG PO CPDB
ORAL_CAPSULE | Freq: Every day | INTRAMUSCULAR | 1 refills | 30.00000 days | Status: AC
Start: 2019-09-29 — End: ?

## 2019-09-30 ENCOUNTER — Encounter: Admit: 2019-09-30 | Discharge: 2019-09-30 | Payer: BC Managed Care – PPO

## 2019-10-04 ENCOUNTER — Encounter: Admit: 2019-10-04 | Discharge: 2019-10-04 | Payer: BC Managed Care – PPO

## 2019-10-05 ENCOUNTER — Ambulatory Visit: Admit: 2019-10-05 | Discharge: 2019-10-06 | Payer: MEDICARE

## 2019-10-05 ENCOUNTER — Encounter: Admit: 2019-10-05 | Discharge: 2019-10-05 | Payer: BC Managed Care – PPO

## 2019-10-05 DIAGNOSIS — E119 Type 2 diabetes mellitus without complications: Secondary | ICD-10-CM

## 2019-10-05 DIAGNOSIS — K639 Disease of intestine, unspecified: Secondary | ICD-10-CM

## 2019-10-05 DIAGNOSIS — K50018 Crohn's disease of small intestine with other complication: Principal | ICD-10-CM

## 2019-10-05 DIAGNOSIS — I8290 Acute embolism and thrombosis of unspecified vein: Secondary | ICD-10-CM

## 2019-10-05 DIAGNOSIS — D699 Hemorrhagic condition, unspecified: Secondary | ICD-10-CM

## 2019-10-05 DIAGNOSIS — G43909 Migraine, unspecified, not intractable, without status migrainosus: Secondary | ICD-10-CM

## 2019-10-05 DIAGNOSIS — M797 Fibromyalgia: Secondary | ICD-10-CM

## 2019-10-05 DIAGNOSIS — I10 Essential (primary) hypertension: Secondary | ICD-10-CM

## 2019-10-05 DIAGNOSIS — K5090 Crohn's disease, unspecified, without complications: Secondary | ICD-10-CM

## 2019-10-05 DIAGNOSIS — J302 Other seasonal allergic rhinitis: Secondary | ICD-10-CM

## 2019-10-05 DIAGNOSIS — D4959 Neoplasm of unspecified behavior of other genitourinary organ: Secondary | ICD-10-CM

## 2019-10-05 DIAGNOSIS — K219 Gastro-esophageal reflux disease without esophagitis: Secondary | ICD-10-CM

## 2019-10-05 DIAGNOSIS — E139 Other specified diabetes mellitus without complications: Secondary | ICD-10-CM

## 2019-10-05 DIAGNOSIS — M359 Systemic involvement of connective tissue, unspecified: Secondary | ICD-10-CM

## 2019-10-05 DIAGNOSIS — IMO0002 Ulcer: Secondary | ICD-10-CM

## 2019-10-05 DIAGNOSIS — K7581 Nonalcoholic steatohepatitis (NASH): Secondary | ICD-10-CM

## 2019-10-05 DIAGNOSIS — G40909 Epilepsy, unspecified, not intractable, without status epilepticus: Secondary | ICD-10-CM

## 2019-10-05 DIAGNOSIS — R42 Dizziness and giddiness: Secondary | ICD-10-CM

## 2019-10-05 MED ORDER — FAMOTIDINE 20 MG PO TAB
20 mg | ORAL_TABLET | Freq: Every evening | ORAL | 5 refills | 90.00000 days | Status: AC
Start: 2019-10-05 — End: ?

## 2019-10-06 DIAGNOSIS — K219 Gastro-esophageal reflux disease without esophagitis: Secondary | ICD-10-CM

## 2019-10-07 ENCOUNTER — Encounter: Admit: 2019-10-07 | Discharge: 2019-10-07 | Payer: BC Managed Care – PPO

## 2019-10-11 ENCOUNTER — Encounter: Admit: 2019-10-11 | Discharge: 2019-10-11 | Payer: BC Managed Care – PPO

## 2019-10-12 ENCOUNTER — Encounter: Admit: 2019-10-12 | Discharge: 2019-10-12 | Payer: BC Managed Care – PPO

## 2019-10-14 ENCOUNTER — Encounter: Admit: 2019-10-14 | Discharge: 2019-10-14 | Payer: BC Managed Care – PPO

## 2019-10-14 ENCOUNTER — Ambulatory Visit: Admit: 2019-10-14 | Discharge: 2019-10-15 | Payer: MEDICARE

## 2019-10-14 ENCOUNTER — Ambulatory Visit: Admit: 2019-10-14 | Discharge: 2019-10-14 | Payer: BC Managed Care – PPO

## 2019-10-14 DIAGNOSIS — R0981 Nasal congestion: Secondary | ICD-10-CM

## 2019-10-14 DIAGNOSIS — R109 Unspecified abdominal pain: Secondary | ICD-10-CM

## 2019-10-14 DIAGNOSIS — R350 Frequency of micturition: Secondary | ICD-10-CM

## 2019-10-14 DIAGNOSIS — K639 Disease of intestine, unspecified: Secondary | ICD-10-CM

## 2019-10-14 DIAGNOSIS — M7989 Other specified soft tissue disorders: Secondary | ICD-10-CM

## 2019-10-14 DIAGNOSIS — D4959 Neoplasm of unspecified behavior of other genitourinary organ: Secondary | ICD-10-CM

## 2019-10-14 DIAGNOSIS — G40909 Epilepsy, unspecified, not intractable, without status epilepticus: Secondary | ICD-10-CM

## 2019-10-14 DIAGNOSIS — R42 Dizziness and giddiness: Secondary | ICD-10-CM

## 2019-10-14 DIAGNOSIS — K5090 Crohn's disease, unspecified, without complications: Secondary | ICD-10-CM

## 2019-10-14 DIAGNOSIS — I8290 Acute embolism and thrombosis of unspecified vein: Secondary | ICD-10-CM

## 2019-10-14 DIAGNOSIS — M797 Fibromyalgia: Secondary | ICD-10-CM

## 2019-10-14 DIAGNOSIS — I10 Essential (primary) hypertension: Secondary | ICD-10-CM

## 2019-10-14 DIAGNOSIS — M79601 Pain in right arm: Secondary | ICD-10-CM

## 2019-10-14 DIAGNOSIS — R5383 Other fatigue: Secondary | ICD-10-CM

## 2019-10-14 DIAGNOSIS — D699 Hemorrhagic condition, unspecified: Secondary | ICD-10-CM

## 2019-10-14 DIAGNOSIS — E119 Type 2 diabetes mellitus without complications: Secondary | ICD-10-CM

## 2019-10-14 DIAGNOSIS — R509 Fever, unspecified: Secondary | ICD-10-CM

## 2019-10-14 DIAGNOSIS — G43909 Migraine, unspecified, not intractable, without status migrainosus: Secondary | ICD-10-CM

## 2019-10-14 DIAGNOSIS — K7581 Nonalcoholic steatohepatitis (NASH): Secondary | ICD-10-CM

## 2019-10-14 DIAGNOSIS — IMO0002 Ulcer: Secondary | ICD-10-CM

## 2019-10-14 DIAGNOSIS — K50918 Crohn's disease, unspecified, with other complication: Secondary | ICD-10-CM

## 2019-10-14 DIAGNOSIS — J302 Other seasonal allergic rhinitis: Secondary | ICD-10-CM

## 2019-10-14 DIAGNOSIS — M359 Systemic involvement of connective tissue, unspecified: Secondary | ICD-10-CM

## 2019-10-14 DIAGNOSIS — E139 Other specified diabetes mellitus without complications: Secondary | ICD-10-CM

## 2019-10-14 DIAGNOSIS — K219 Gastro-esophageal reflux disease without esophagitis: Secondary | ICD-10-CM

## 2019-10-14 LAB — CBC AND DIFF
Lab: 0 % (ref 0–2)
Lab: 0 10*3/uL (ref 0–0.20)
Lab: 0.1 10*3/uL (ref 0–0.45)
Lab: 0.8 10*3/uL — ABNORMAL HIGH (ref 0–0.80)
Lab: 1.6 10*3/uL (ref 1.0–4.8)
Lab: 12 10*3/uL — ABNORMAL HIGH (ref 4.5–11.0)
Lab: 13 % — ABNORMAL LOW (ref 24–44)
Lab: 13 g/dL — ABNORMAL HIGH (ref 12.0–15.0)
Lab: 17 % — ABNORMAL HIGH (ref >60)
Lab: 2 % (ref 0–5)
Lab: 293 K/UL — ABNORMAL LOW (ref >60)
Lab: 30 pg (ref 26–34)
Lab: 34 g/dL (ref 32.0–36.0)
Lab: 38 % (ref 36–45)
Lab: 4.3 M/UL (ref 4.0–5.0)
Lab: 78 % — ABNORMAL HIGH (ref 41–77)
Lab: 8.1 FL (ref 7–11)
Lab: 89 FL (ref 80–100)
Lab: 9.8 10*3/uL — ABNORMAL HIGH (ref 1.8–7.0)

## 2019-10-14 LAB — URINALYSIS DIPSTICK REFLEX TO CULTURE
Lab: NEGATIVE
Lab: NEGATIVE
Lab: NEGATIVE
Lab: NEGATIVE
Lab: NEGATIVE
Lab: NEGATIVE

## 2019-10-14 LAB — COMPREHENSIVE METABOLIC PANEL
Lab: 102 MMOL/L (ref 98–110)
Lab: 117 mg/dL — ABNORMAL HIGH (ref 70–100)
Lab: 138 MMOL/L (ref 137–147)
Lab: 4.6 MMOL/L (ref 3.5–5.1)

## 2019-10-14 LAB — URINALYSIS MICROSCOPIC REFLEX TO CULTURE

## 2019-10-14 LAB — LIPID PROFILE
Lab: 149 mg/dL — ABNORMAL HIGH (ref <200)
Lab: 283 mg/dL — ABNORMAL HIGH (ref <150)
Lab: 87 mg/dL (ref <100)

## 2019-10-14 LAB — TSH WITH FREE T4 REFLEX: Lab: 3 uU/mL — ABNORMAL LOW (ref >40)

## 2019-10-14 MED ORDER — CEPHALEXIN 500 MG PO CAP
500 mg | ORAL_CAPSULE | Freq: Four times a day (QID) | ORAL | 0 refills | Status: AC
Start: 2019-10-14 — End: ?

## 2019-10-14 NOTE — Progress Notes
Patient has been scheduled for a stat US at [facility] at 215p. Patient notified and verbalized understanding.  David Stall, RN          Answers for HPI/ROS submitted by the patient on 10/13/2019   Patient Reported Other  What medical problem brings you in to see the provider?: Swelling in lymph nodes right upper arm and armpit  Your symptom is...: recurrent  When did they start?: in the past 7 days  How has the symptom changed?: gradually worsening  On a scale of 0 to 10 (10 being the worst), how strong is your pain?: 4/10  How severe is your pain?: moderate  Abdominal pain: Yes  No appetite: No  Joint Pain: Yes  Change in bowel habit: Yes  Chest pain: No  Chills: Yes  Congestion: Yes  Cough: No  Sweating much more than normal: No  Fatigue: Yes  Fever: Yes  Headaches: Yes  Joint swelling: Yes  Muscle pain: Yes  Nausea: Yes  Neck pain: No  Numbness: Yes  Rash: No  Sore throat: Yes  Swollen glands: Yes  Urinary symptoms: Yes  Vertigo: Yes  Visual change: No  Vomiting: No  Weakness: Yes  Which of the following, if any, make(s) your symptom worse?: nothing

## 2019-10-14 NOTE — Progress Notes
Date of Service: 10/14/2019    Tina Snyder is a 54 y.o. female. DOB: Jun 23, 1965   MRN#: [id number]    Subjective:   Chief Complaint   Patient presents with   ? Arm pain     x1 week        Obtained patient's verbal consent to treat them and their agreement to [facility] financial policy and NPP via this telehealth visit during the [facility] Emergency    Patient Reported Other  What medical problem brings you in to see the provider? Swelling in lymph nodes right upper arm and armpit. This is a recurrent problem. The current episode started in the past 7 days. The problem has been gradually worsening. The pain score is: 4/10. The pain severity is moderate.  Associated symptoms include abdominal pain, arthralgias, a change in bowel habit, chills, congestion, fatigue, a fever (100.4 is the highest), headaches, joint swelling, myalgias, nausea, numbness, a sore throat, swollen glands, urinary symptoms, vertigo and weakness. Pertinent negatives include no anorexia, chest pain, coughing, diaphoresis, neck pain, rash, visual change or vomiting. Nothing aggravates the symptoms.     Tina Snyder is 54 y.o. patient who presents to clinic for urgent visit.  She states she has not been feeling well over the last week.  She has had intermittent low-grade fevers with the highest being 100.4.  She does not know if all of her symptoms are related or separate issues.    For she has been having nasal congestion and sore throat.  She has been doing salt water gargles for management.  She denies cough, shortness of breath or wheezing.    Next she has been having swelling in the right armpit and extends down into the humerus.  The area is hard and warm to the touch.  She reports a history of lymphedema does not know if this could be related to lymphedema or not.  She also has a history of blood clots.  There is no skin discoloration.  She denies abscess or drainage.    Her urine has been extremely odorous over the last week.  She states she has urinary frequency and urgency but only a small amount of urine production with each void.  She denies dysuria or hematuria.  She does have middle back pain?bilateral.    She has a history of Crohn's disease and has been suffering from constipation recently.  She reached out to GI who encouraged her to take MiraLAX as needed.  This has been helping her constipation and abdominal pain.  There is no blood in her stool.      Medical History:   Diagnosis Date   ? Autoimmune disease (HCC)     Chron's   ? Bleeding tendency (HCC)    ? 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 Bowel disease    ? Crohn disease (HCC)    ? Dizziness    ? DM (diabetes mellitus), secondary-steroid-induced 2012   ? Fibromyalgia    ? GERD (gastroesophageal reflux disease)    ? Hypertension    ? Hypertensive heart disease    ? Migraines    ? Seasonal allergic reaction    ? Seizure disorder (HCC)    ? Steatohepatitis     felt secondary to medications   ? Type 2 diabetes mellitus without complication, with long-term current use of insulin (HCC) 10/22/2017   ? Ulcer    ? Vaginal tumor  Surgical History:   Procedure Laterality Date   ? HX ADENOIDECTOMY  05/1995   ? LUNG SURGERY  10/22/2010    lung biopsy   ? LIVER BIOPSY  12/2010   ? ESOPHAGOGASTRODUODENOSCOPY N/A 03/13/2015    Performed by Elyn Aquas, MD at [facility] ENDO   ? COLONOSCOPY N/A 03/13/2015    Performed by Elyn Aquas, MD at [facility] ENDO   ? ESOPHAGOGASTRODUODENOSCOPY BIOPSY  03/13/2015    Performed by Elyn Aquas, MD at [facility] ENDO   ? COLONOSCOPY BIOPSY  03/13/2015    Performed by Elyn Aquas, MD at [facility] ENDO   ? ESOPHAGOGASTRODUODENOSCOPY WITH SPECIMEN COLLECTION BY BRUSHING/ WASHING N/A 06/30/2017    Performed by Jolee Ewing, MD at [facility] ENDO   ? COLONOSCOPY DIAGNOSTIC WITH SPECIMEN COLLECTION BY BRUSHING/ WASHING - FLEXIBLE N/A 06/30/2017    Performed by Jolee Ewing, MD at [facility] ENDO   ? ESOPHAGOGASTRODUODENOSCOPY WITH BIOPSY - FLEXIBLE N/A 06/30/2017 Performed by Jolee Ewing, MD at [facility] ENDO   ? COLONOSCOPY WITH BIOPSY - FLEXIBLE  06/30/2017    Performed by Jolee Ewing, MD at [facility] ENDO   ? COLONOSCOPY DIAGNOSTIC WITH SPECIMEN COLLECTION BY BRUSHING/ WASHING - FLEXIBLE N/A 11/11/2018    Performed by Tommie Sams, MD at [facility] ENDO   ? ESOPHAGOGASTRODUODENOSCOPY WITH SPECIMEN COLLECTION BY BRUSHING/ WASHING N/A 11/11/2018    Performed by Tommie Sams, MD at [facility] ENDO   ? ESOPHAGOGASTRODUODENOSCOPY WITH BIOPSY - FLEXIBLE  11/11/2018    Performed by Tommie Sams, MD at [facility] ENDO   ? COLONOSCOPY DIAGNOSTIC WITH SPECIMEN COLLECTION BY BRUSHING/ WASHING - FLEXIBLE N/A 11/12/2018    Performed by Veneta Penton, MD at [facility] ENDO   ? COLONOSCOPY WITH BIOPSY - FLEXIBLE  11/12/2018    Performed by Veneta Penton, MD at [facility] ENDO   ? APPENDECTOMY     ? BRONCHOSCOPY     ? CHOLECYSTECTOMY     ? GASTRIC FUNDOPLICATION     ? HEMORRHOIDECTOMY     ? HX BREAST LUMPECTOMY     ? HX ENDOSCOPY      colonoscopy 2014   ? HX HYSTERECTOMY      partial 02/1995, ovaries removed 1999   ? OOPHORECTOMY      1999 ovaries removed   ? SMALL BOWEL RESECTION     ? SUBTOTAL COLECTOMY      Ileocolectomy        family history includes Asthma in her son; Basal Cell Carcinoma in her mother; Cancer in her paternal grandmother; Celiac Disease in her son; Depression in her father, mother, and paternal grandmother; Heart Failure in her maternal grandfather and paternal grandfather; High Cholesterol in her father and paternal grandmother; Hypertension in her father, maternal grandmother, mother, paternal grandfather, and paternal grandmother; Migraines in her maternal grandfather; Stroke in her maternal grandfather and maternal grandmother.    Social History     Socioeconomic History   ? Marital status: Married     Spouse name: Susy Frizzle   ? Number of children: 2   ? Years of education: Not on file   ? Highest education level: Not on file   Occupational History   ? 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 Smoking status: Never Smoker   ? Smokeless tobacco: Never Used   Substance and Sexual Activity   ? Alcohol use: No     Alcohol/week: 0.0 standard drinks   ? Drug use: No   ?  Sexual activity: Not on file   Other Topics Concern   ? 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       Social History     Tobacco Use   ? Smoking status: Never Smoker   ? Smokeless tobacco: Never Used   Substance Use Topics   ? Alcohol use: No     Alcohol/week: 0.0 standard drinks            Review of Systems   Constitution: Positive for chills, fatigue, fever (100.4 is the highest) and malaise/fatigue. Negative for diaphoresis.   HENT: Positive for congestion and sore throat. Negative for ear pain.    Cardiovascular: Negative for chest pain.   Respiratory: Negative for cough, shortness of breath and wheezing.    Skin: Negative for color change and rash.   Musculoskeletal: Positive for arthralgias, joint swelling and myalgias. Negative for neck pain.   Gastrointestinal: Positive for abdominal pain, change in bowel habit and nausea. Negative for anorexia, hematochezia, melena and vomiting.   Neurological: Positive for headaches, numbness, vertigo and weakness.   Psychiatric/Behavioral: Negative for altered mental status.       Objective:     ? atorvastatin (LIPITOR) 40 mg tablet Take one tablet by mouth daily.   ? Blood-Glucose Sensor (DEXCOM G6 SENSOR) devi Use 1 each as directed as directed. Change every 10 days   ? budesonide (ENTOCORT EC) 3 mg capsule Take two capsules by mouth daily. Indications: Crohn's disease   ? bumetanide (BUMEX) 1 mg tablet TAKE 1 TABLET BY MOUTH EVERY DAY   ? buPROPion XL (WELLBUTRIN XL) 300 mg tablet Take one tablet by mouth every morning. Do not crush or chew.   ? cephalexin (KEFLEX) 500 mg capsule Take one capsule by mouth four times daily for 7 days.   ? cyclobenzaprine (FLEXERIL) 10 mg tablet Take 10 mg by mouth daily as needed. .   ? DEXCOM G6 RECEIVER misc Use 1 each as directed daily.   ? DEXCOM G6 TRANSMITTER devi Use 1 each as directed as directed. Every 90 days   ? DEXILANT 60 mg capsule TAKE ONE CAPSULE BY MOUTH DAILY. INDICATIONS: GASTROESOPHAGEAL REFLUX DISEASE   ? dicyclomine (BENTYL) 20 mg tablet TAKE 1 TABLET BY MOUTH EVERY 6 HOURS AS NEEDED   ? duloxetine DR (CYMBALTA) 60 mg capsule Take 60 mg by mouth daily with breakfast.   ? famotidine (PEPCID) 20 mg tablet Take one tablet by mouth at bedtime daily.   ? fluticasone propionate (FLONASE) 50 mcg/actuation nasal spray, suspension Apply one spray to each nostril as directed daily. Shake bottle gently before using.   ? HUMULIN R U-500 (CONC) KWIKPEN 500 unit/mL (3 mL) injection PEN Take 310 units before breakfast, 250 units before lunch, and 125 units before dinner. Take about 20 minutes prior to the meal   ? insulin pen needles (disposable) (B-D NANO) 32 gauge x 5/32 pen needle Use one each as directed three times daily before meals. Use with insulin injections.   ? nebivolol (BYSTOLIC) 10 mg tablet Take one tablet by mouth daily.   ? spironolactone (ALDACTONE) 50 mg tablet Take 50 mg by mouth daily.   ? sucralfate (CARAFATE) 1 gram tablet Take one tablet by mouth three  times daily as needed. Take on an empty stomach. May crush and add water to make slurry   ? temazepam (RESTORIL) 15 mg capsule Take one capsule by mouth at bedtime as needed.   ? traMADoL (ULTRAM) 50 mg tablet Take one tablet by mouth every 6 hours as needed for Pain.   ? vedolizumab (ENTYVIO IV) Administer  through vein.     Vitals:    10/14/19 1054   PainSc: Four     There is no height or weight on file to calculate BMI.     Physical Exam  Vitals and nursing note reviewed.   Constitutional:       Appearance: Normal appearance.   HENT: Head: Normocephalic and atraumatic.   Pulmonary:      Effort: Pulmonary effort is normal. No respiratory distress.   Musculoskeletal:      Right lower leg: No edema.      Left lower leg: No edema.   Skin:     General: Skin is warm and dry.      Findings: No erythema.   Neurological:      Mental Status: She is alert and oriented to person, place, and time.   Psychiatric:         Behavior: Behavior normal.       Greater than 30 minutes spent during this patient encounter with >50% of the time counseling and coordinating care. Consultation time included patient assessment, obtaining patient history, discussion of medical issues, discussing available data and test results, and educating patient regarding conditions and treatment options as well as potential risks and benefits. Additional time was spent on documentation, reviewing patient history and prior office notes, interpreting data and test results, and ordering medications, tests, and procedures as appropriate.        Assessment and Plan:      1. Urinary frequency    2. Right arm pain    3. Swelling of right upper extremity    4. Bilateral flank pain    5. Nasal congestion    6. Crohn's disease with other complication, unspecified gastrointestinal tract location (HCC)    7. Fever, unspecified fever cause    8. Fatigue, unspecified type      She has not felt well over the last week and has had fatigue and intermittent low-grade temperatures.  We discussed all of her symptoms are likely not related and we need to evaluate all of them.  I will obtain a stat right upper extremity Doppler ultrasound to rule out a blood clot with the right upper extremity swelling she is experiencing.  If this is negative my concern would be a developing cellulitis V.  Lymphedema.  I am treating her with Keflex for likely urinary tract infection.  I have asked her to leave a urine sample prior to starting antibiotics.  I am hoping that Keflex would treat the possible cellulitis and possible upper respiratory infection as well.  She can take a daily antihistamine for possible allergies.  Will obtain cbc and CMP  We discussed the limitations of telehealth evaluation.  If no improvement with Keflex she needs to be evaluated in clinic.  She knows to seek emergent care over the weekend if worsening or changing symptoms.  She will continue to discuss GI issues with her gastroenterologist.  GI issues have improved with use of MiraLAX as needed.    Orders Placed This Encounter   ? US DOPPLER VENOUS RIGHT   ? URINALYSIS DIPSTICK REFLEX TO CULTURE   ?  URINALYSIS MICROSCOPIC REFLEX TO CULTURE   ? UA REFLEX LABEL   ? BASIC METABOLIC PANEL today   ? CBC AND DIFF today   ? cephalexin (KEFLEX) 500 mg capsule     Future Appointments   Date Time Provider Department Center   10/14/2019  2:15 PM [facility] [place] [facility] [place]   11/18/2019 11:15 AM [id number] [facility] None   12/07/2019 10:40 AM Sale, Berline Chough, MD [facility] ENT   12/24/2019  4:00 PM Martie Round, PA-C [id number] IM   01/13/2020  2:00 PM Reubin Milan, MD [facility] CVM Exam   02/02/2020  8:30 AM Duthuluru, Madelaine Etienne, MD [facility] IM     There are no Patient Instructions on file for this visit.    Jonnie Finner, APRN-NP

## 2019-10-18 ENCOUNTER — Encounter: Admit: 2019-10-18 | Discharge: 2019-10-18 | Payer: BC Managed Care – PPO

## 2019-10-21 ENCOUNTER — Encounter: Admit: 2019-10-21 | Discharge: 2019-10-21 | Payer: BC Managed Care – PPO

## 2019-10-22 ENCOUNTER — Encounter: Admit: 2019-10-22 | Discharge: 2019-10-22 | Payer: BC Managed Care – PPO

## 2019-11-18 ENCOUNTER — Encounter: Admit: 2019-11-18 | Discharge: 2019-11-18 | Payer: BC Managed Care – PPO

## 2019-11-18 DIAGNOSIS — I10 Essential (primary) hypertension: Secondary | ICD-10-CM

## 2019-11-18 DIAGNOSIS — M359 Systemic involvement of connective tissue, unspecified: Secondary | ICD-10-CM

## 2019-11-18 DIAGNOSIS — K639 Disease of intestine, unspecified: Secondary | ICD-10-CM

## 2019-11-18 DIAGNOSIS — D699 Hemorrhagic condition, unspecified: Secondary | ICD-10-CM

## 2019-11-18 DIAGNOSIS — K219 Gastro-esophageal reflux disease without esophagitis: Secondary | ICD-10-CM

## 2019-11-18 DIAGNOSIS — K7581 Nonalcoholic steatohepatitis (NASH): Secondary | ICD-10-CM

## 2019-11-18 DIAGNOSIS — K5090 Crohn's disease, unspecified, without complications: Secondary | ICD-10-CM

## 2019-11-18 DIAGNOSIS — R42 Dizziness and giddiness: Secondary | ICD-10-CM

## 2019-11-18 DIAGNOSIS — E139 Other specified diabetes mellitus without complications: Secondary | ICD-10-CM

## 2019-11-18 DIAGNOSIS — G40909 Epilepsy, unspecified, not intractable, without status epilepticus: Secondary | ICD-10-CM

## 2019-11-18 DIAGNOSIS — D4959 Neoplasm of unspecified behavior of other genitourinary organ: Secondary | ICD-10-CM

## 2019-11-18 DIAGNOSIS — IMO0002 Ulcer: Secondary | ICD-10-CM

## 2019-11-18 DIAGNOSIS — I8290 Acute embolism and thrombosis of unspecified vein: Secondary | ICD-10-CM

## 2019-11-18 DIAGNOSIS — G43909 Migraine, unspecified, not intractable, without status migrainosus: Secondary | ICD-10-CM

## 2019-11-18 DIAGNOSIS — J302 Other seasonal allergic rhinitis: Secondary | ICD-10-CM

## 2019-11-18 DIAGNOSIS — E119 Type 2 diabetes mellitus without complications: Secondary | ICD-10-CM

## 2019-11-18 DIAGNOSIS — M797 Fibromyalgia: Secondary | ICD-10-CM

## 2019-11-18 MED ORDER — ACETAMINOPHEN 500 MG PO TAB
500 mg | Freq: Once | ORAL | 0 refills
Start: 2019-11-18 — End: ?

## 2019-11-18 MED ORDER — DIPHENHYDRAMINE HCL 50 MG/ML IJ SOLN
25-50 mg | Freq: Once | INTRAVENOUS | 0 refills | Status: CP
Start: 2019-11-18 — End: ?
  Administered 2019-11-18: 17:00:00 50 mg via INTRAVENOUS

## 2019-11-18 MED ORDER — VEDOLIZUMAB IVPB
300 mg | Freq: Once | INTRAVENOUS | 0 refills | Status: CP
Start: 2019-11-18 — End: ?
  Administered 2019-11-18 (×2): 300 mg via INTRAVENOUS

## 2019-11-18 MED ORDER — VEDOLIZUMAB IVPB
300 mg | Freq: Once | INTRAVENOUS | 0 refills
Start: 2019-11-18 — End: ?

## 2019-11-18 MED ORDER — DIPHENHYDRAMINE HCL 50 MG/ML IJ SOLN
25-50 mg | Freq: Once | INTRAVENOUS | 0 refills
Start: 2019-11-18 — End: ?

## 2019-11-18 MED ORDER — ACETAMINOPHEN 500 MG PO TAB
500 mg | Freq: Once | ORAL | 0 refills | Status: CP
Start: 2019-11-18 — End: ?
  Administered 2019-11-18: 17:00:00 500 mg via ORAL

## 2019-11-18 MED ADMIN — DIPHENHYDRAMINE HCL 50 MG/ML IJ SOLN [2508]: 50 mg | INTRAVENOUS | @ 18:00:00 | Stop: 2019-11-18 | NDC 00641037621

## 2019-11-18 NOTE — Progress Notes
Patient tolerated Entyvio infusion; no reaction noted.

## 2019-11-19 ENCOUNTER — Ambulatory Visit: Admit: 2019-11-18 | Discharge: 2019-11-19 | Payer: BC Managed Care – PPO

## 2019-11-19 DIAGNOSIS — K50918 Crohn's disease, unspecified, with other complication: Principal | ICD-10-CM

## 2019-11-20 ENCOUNTER — Encounter: Admit: 2019-11-20 | Discharge: 2019-11-20 | Payer: BC Managed Care – PPO

## 2019-11-20 DIAGNOSIS — K50018 Crohn's disease of small intestine with other complication: Secondary | ICD-10-CM

## 2019-11-20 DIAGNOSIS — R609 Edema, unspecified: Secondary | ICD-10-CM

## 2019-11-22 MED ORDER — BUMETANIDE 1 MG PO TAB
ORAL_TABLET | Freq: Every day | 1 refills | Status: AC
Start: 2019-11-22 — End: ?

## 2019-11-22 MED ORDER — BUDESONIDE 3 MG PO CECX
ORAL_CAPSULE | Freq: Every day | ORAL | 1 refills | 30.00000 days | Status: AC
Start: 2019-11-22 — End: ?

## 2019-11-24 ENCOUNTER — Encounter: Admit: 2019-11-24 | Discharge: 2019-11-24 | Payer: BC Managed Care – PPO

## 2019-11-25 MED ORDER — PEN NEEDLE, DIABETIC 32 GAUGE X 5/32" MISC NDLE
1 | Freq: Three times a day (TID) | 2 refills | 30.00000 days | Status: AC
Start: 2019-11-25 — End: ?

## 2019-12-06 ENCOUNTER — Encounter: Admit: 2019-12-06 | Discharge: 2019-12-06 | Payer: BC Managed Care – PPO

## 2019-12-07 ENCOUNTER — Inpatient Hospital Stay: Payer: BC Managed Care – PPO

## 2019-12-07 ENCOUNTER — Encounter: Admit: 2019-12-07 | Discharge: 2019-12-07 | Payer: BC Managed Care – PPO

## 2019-12-08 ENCOUNTER — Inpatient Hospital Stay: Admit: 2019-12-08 | Discharge: 2019-12-08 | Payer: BC Managed Care – PPO

## 2019-12-08 ENCOUNTER — Encounter: Admit: 2019-12-08 | Discharge: 2019-12-08 | Payer: BC Managed Care – PPO

## 2019-12-08 DIAGNOSIS — I8290 Acute embolism and thrombosis of unspecified vein: Secondary | ICD-10-CM

## 2019-12-08 DIAGNOSIS — K639 Disease of intestine, unspecified: Secondary | ICD-10-CM

## 2019-12-08 DIAGNOSIS — K7581 Nonalcoholic steatohepatitis (NASH): Secondary | ICD-10-CM

## 2019-12-08 DIAGNOSIS — R42 Dizziness and giddiness: Secondary | ICD-10-CM

## 2019-12-08 DIAGNOSIS — K5090 Crohn's disease, unspecified, without complications: Secondary | ICD-10-CM

## 2019-12-08 DIAGNOSIS — G43909 Migraine, unspecified, not intractable, without status migrainosus: Secondary | ICD-10-CM

## 2019-12-08 DIAGNOSIS — J302 Other seasonal allergic rhinitis: Secondary | ICD-10-CM

## 2019-12-08 DIAGNOSIS — M797 Fibromyalgia: Secondary | ICD-10-CM

## 2019-12-08 DIAGNOSIS — K219 Gastro-esophageal reflux disease without esophagitis: Secondary | ICD-10-CM

## 2019-12-08 DIAGNOSIS — E139 Other specified diabetes mellitus without complications: Secondary | ICD-10-CM

## 2019-12-08 DIAGNOSIS — G40909 Epilepsy, unspecified, not intractable, without status epilepticus: Secondary | ICD-10-CM

## 2019-12-08 DIAGNOSIS — E119 Type 2 diabetes mellitus without complications: Secondary | ICD-10-CM

## 2019-12-08 DIAGNOSIS — D699 Hemorrhagic condition, unspecified: Secondary | ICD-10-CM

## 2019-12-08 DIAGNOSIS — I10 Essential (primary) hypertension: Secondary | ICD-10-CM

## 2019-12-08 DIAGNOSIS — IMO0002 Ulcer: Secondary | ICD-10-CM

## 2019-12-08 DIAGNOSIS — D4959 Neoplasm of unspecified behavior of other genitourinary organ: Secondary | ICD-10-CM

## 2019-12-08 MED ADMIN — ONDANSETRON HCL (PF) 4 MG/2 ML IJ SOLN [136012]: 4 mg | INTRAVENOUS | @ 23:00:00 | NDC 36000001225

## 2019-12-08 MED ADMIN — METHYLPREDNISOLONE SOD SUC(PF) 125 MG/2 ML IJ SOLR [301233]: 62.5 mg | INTRAVENOUS | @ 11:00:00 | NDC 00009004725

## 2019-12-08 MED ADMIN — LACTATED RINGERS IV SOLP [4318]: 1000.000 mL | INTRAVENOUS | @ 16:00:00 | Stop: 2019-12-10 | NDC 00338011704

## 2019-12-08 MED ADMIN — HEPARIN, PORCINE (PF) 5,000 UNIT/0.5 ML IJ SYRG [95535]: 5000 [IU] | SUBCUTANEOUS | @ 11:00:00 | Stop: 2019-12-08 | NDC 00409131611

## 2019-12-08 MED ADMIN — BUDESONIDE 3 MG PO CECX [82000]: 6 mg | ORAL | @ 16:00:00 | NDC 65162077810

## 2019-12-08 MED ADMIN — ONDANSETRON HCL (PF) 4 MG/2 ML IJ SOLN [136012]: 4 mg | INTRAVENOUS | @ 16:00:00 | NDC 36000001225

## 2019-12-08 MED ADMIN — LACTATED RINGERS IV SOLP [4318]: 1000 mL | INTRAVENOUS | @ 11:00:00 | Stop: 2019-12-08 | NDC 00338011704

## 2019-12-08 MED ADMIN — MILK/MOLASSES(#) 1:1 RECTAL ENEMA [210148]: 200 mL | RECTAL | @ 16:00:00 | Stop: 2019-12-08 | NDC 54029011921

## 2019-12-08 MED ADMIN — LACTATED RINGERS IV SOLP [4318]: 1000.000 mL | INTRAVENOUS | @ 21:00:00 | Stop: 2019-12-10 | NDC 00338011704

## 2019-12-08 MED ADMIN — LIDOCAINE HCL 2 % MM JELP [170085]: 6.000 mL | TOPICAL | @ 21:00:00 | Stop: 2019-12-08 | NDC 25021067376

## 2019-12-08 MED ADMIN — PANTOPRAZOLE 40 MG IV SOLR [78621]: 40 mg | INTRAVENOUS | @ 16:00:00 | NDC 55150020200

## 2019-12-08 MED ADMIN — INSULIN GLARGINE 100 UNIT/ML (3 ML) SC INJ PEN [163596]: 50 [IU] | SUBCUTANEOUS | @ 14:00:00 | NDC 00088221905

## 2019-12-08 MED ADMIN — FUROSEMIDE 10 MG/ML IJ SOLN [3291]: 60 mg | INTRAVENOUS | @ 14:00:00 | Stop: 2019-12-08 | NDC 36000028325

## 2019-12-08 MED ADMIN — LORAZEPAM 2 MG/ML IJ SYRG [86485]: 1 mg | INTRAVENOUS | @ 21:00:00 | NDC 00409198503

## 2019-12-08 MED ADMIN — INSULIN ASPART 100 UNIT/ML SC FLEXPEN [87504]: 8 [IU] | SUBCUTANEOUS | @ 14:00:00 | NDC 00169633910

## 2019-12-09 ENCOUNTER — Encounter: Admit: 2019-12-09 | Discharge: 2019-12-09 | Payer: BC Managed Care – PPO

## 2019-12-09 MED ADMIN — DICYCLOMINE 10 MG PO CAP [2418]: 10 mg | ORAL | @ 07:00:00 | NDC 60687036911

## 2019-12-09 MED ADMIN — NEBIVOLOL 10 MG PO TAB [166590]: 10 mg | ORAL | @ 23:00:00 | NDC 00456141090

## 2019-12-09 MED ADMIN — DICYCLOMINE 10 MG PO CAP [2418]: 10 mg | ORAL | @ 16:00:00 | NDC 60687036911

## 2019-12-09 MED ADMIN — DICYCLOMINE 10 MG PO CAP [2418]: 10 mg | ORAL | @ 22:00:00 | NDC 60687036911

## 2019-12-09 MED ADMIN — BUDESONIDE 3 MG PO CECX [82000]: 6 mg | ORAL | @ 15:00:00 | NDC 65162077810

## 2019-12-09 MED ADMIN — PROMETHAZINE 25 MG PO TAB [6622]: 25 mg | ORAL | @ 15:00:00 | Stop: 2019-12-09 | NDC 00904646161

## 2019-12-09 MED ADMIN — ONDANSETRON HCL (PF) 4 MG/2 ML IJ SOLN [136012]: 4 mg | INTRAVENOUS | @ 04:00:00 | NDC 36000001225

## 2019-12-09 MED ADMIN — INSULIN GLARGINE 100 UNIT/ML (3 ML) SC INJ PEN [163596]: 55 [IU] | SUBCUTANEOUS | @ 15:00:00 | NDC 00088221905

## 2019-12-09 MED ADMIN — ONDANSETRON HCL (PF) 4 MG/2 ML IJ SOLN [136012]: 4 mg | INTRAVENOUS | @ 22:00:00 | NDC 36000001225

## 2019-12-09 MED ADMIN — DICYCLOMINE 10 MG PO CAP [2418]: 10 mg | ORAL | @ 11:00:00 | NDC 60687036911

## 2019-12-09 MED ADMIN — METHYLPREDNISOLONE SOD SUC(PF) 125 MG/2 ML IJ SOLR [301233]: 62.5 mg | INTRAVENOUS | @ 11:00:00 | Stop: 2019-12-09 | NDC 00009004725

## 2019-12-09 MED ADMIN — ONDANSETRON HCL (PF) 4 MG/2 ML IJ SOLN [136012]: 4 mg | INTRAVENOUS | @ 11:00:00 | NDC 36000001225

## 2019-12-09 MED ADMIN — LACTATED RINGERS IV SOLP [4318]: 1000.000 mL | INTRAVENOUS | @ 07:00:00 | Stop: 2019-12-09 | NDC 00338011704

## 2019-12-09 MED ADMIN — PANTOPRAZOLE 40 MG IV SOLR [78621]: 40 mg | INTRAVENOUS | @ 03:00:00 | NDC 55150020200

## 2019-12-09 MED ADMIN — PANTOPRAZOLE 40 MG IV SOLR [78621]: 40 mg | INTRAVENOUS | @ 16:00:00 | NDC 55150020200

## 2019-12-09 MED ADMIN — TRAMADOL 50 MG PO TAB [14632]: 50 mg | ORAL | @ 09:00:00 | Stop: 2019-12-09 | NDC 51079099101

## 2019-12-09 MED ADMIN — ONDANSETRON HCL (PF) 4 MG/2 ML IJ SOLN [136012]: 4 mg | INTRAVENOUS | @ 16:00:00 | NDC 36000001225

## 2019-12-09 MED ADMIN — FONDAPARINUX 2.5 MG/0.5 ML SC SYRG [79078]: 2.5 mg | SUBCUTANEOUS | @ 04:00:00 | NDC 67457058200

## 2019-12-10 MED ADMIN — PANTOPRAZOLE 40 MG IV SOLR [78621]: 40 mg | INTRAVENOUS | @ 17:00:00 | Stop: 2019-12-10 | NDC 55150020200

## 2019-12-10 MED ADMIN — ACETAMINOPHEN 325 MG PO TAB [101]: 650 mg | ORAL | @ 03:00:00 | NDC 00904677361

## 2019-12-10 MED ADMIN — DICYCLOMINE 10 MG PO CAP [2418]: 10 mg | ORAL | @ 17:00:00 | Stop: 2019-12-10 | NDC 60687036911

## 2019-12-10 MED ADMIN — ONDANSETRON HCL (PF) 4 MG/2 ML IJ SOLN [136012]: 4 mg | INTRAVENOUS | @ 17:00:00 | Stop: 2019-12-10 | NDC 36000001225

## 2019-12-10 MED ADMIN — ONDANSETRON HCL (PF) 4 MG/2 ML IJ SOLN [136012]: 4 mg | INTRAVENOUS | @ 04:00:00 | NDC 36000001225

## 2019-12-10 MED ADMIN — NEBIVOLOL 10 MG PO TAB [166590]: 10 mg | ORAL | @ 13:00:00 | NDC 00456141090

## 2019-12-10 MED ADMIN — DICYCLOMINE 10 MG PO CAP [2418]: 10 mg | ORAL | @ 03:00:00 | NDC 60687036911

## 2019-12-10 MED ADMIN — DICYCLOMINE 10 MG PO CAP [2418]: 10 mg | ORAL | @ 13:00:00 | Stop: 2019-12-10 | NDC 60687036911

## 2019-12-10 MED ADMIN — BUMETANIDE 1 MG PO TAB [9310]: 1 mg | ORAL | @ 19:00:00 | NDC 50268013111

## 2019-12-10 MED ADMIN — TRAMADOL 50 MG PO TAB [14632]: 50 mg | ORAL | @ 08:00:00 | Stop: 2019-12-10 | NDC 51079099101

## 2019-12-10 MED ADMIN — TRAMADOL 50 MG PO TAB [14632]: 50 mg | ORAL | @ 23:00:00 | NDC 51079099101

## 2019-12-10 MED ADMIN — PANTOPRAZOLE 40 MG IV SOLR [78621]: 40 mg | INTRAVENOUS | @ 03:00:00 | NDC 55150020200

## 2019-12-10 MED ADMIN — SPIRONOLACTONE 50 MG PO TAB [11426]: 50 mg | ORAL | @ 19:00:00 | NDC 60687047611

## 2019-12-10 MED ADMIN — ONDANSETRON HCL (PF) 4 MG/2 ML IJ SOLN [136012]: 4 mg | INTRAVENOUS | @ 13:00:00 | Stop: 2019-12-10 | NDC 36000001225

## 2019-12-10 MED ADMIN — BUDESONIDE 3 MG PO CECX [82000]: 6 mg | ORAL | @ 13:00:00 | NDC 65162077810

## 2019-12-10 MED ADMIN — FONDAPARINUX 2.5 MG/0.5 ML SC SYRG [79078]: 2.5 mg | SUBCUTANEOUS | @ 03:00:00 | NDC 67457058200

## 2019-12-10 MED ADMIN — PROMETHAZINE 25 MG PO TAB [6622]: 12.5 mg | ORAL | @ 03:00:00 | NDC 00904646161

## 2019-12-10 MED ADMIN — ONDANSETRON HCL 4 MG PO TAB [78232]: 4 mg | ORAL | @ 23:00:00 | NDC 00904655161

## 2019-12-10 MED ADMIN — PREDNISONE 20 MG PO TAB [6496]: 40 mg | ORAL | @ 13:00:00 | Stop: 2019-12-15 | NDC 00054001820

## 2019-12-11 ENCOUNTER — Encounter: Admit: 2019-12-11 | Discharge: 2019-12-11 | Payer: BC Managed Care – PPO

## 2019-12-11 MED ADMIN — ONDANSETRON HCL 4 MG PO TAB [78232]: 4 mg | ORAL | @ 20:00:00 | NDC 00904655161

## 2019-12-11 MED ADMIN — LACTATED RINGERS IV SOLP [4318]: 1000.000 mL | INTRAVENOUS | @ 18:00:00 | Stop: 2019-12-13 | NDC 00338011704

## 2019-12-11 MED ADMIN — ONDANSETRON HCL 4 MG PO TAB [78232]: 4 mg | ORAL | @ 14:00:00 | NDC 00904655161

## 2019-12-11 MED ADMIN — SPIRONOLACTONE 50 MG PO TAB [11426]: 50 mg | ORAL | @ 15:00:00 | Stop: 2019-12-11 | NDC 60687047611

## 2019-12-11 MED ADMIN — TRAMADOL 50 MG PO TAB [14632]: 50 mg | ORAL | @ 21:00:00 | NDC 51079099101

## 2019-12-11 MED ADMIN — ONDANSETRON HCL 4 MG PO TAB [78232]: 4 mg | ORAL | @ 01:00:00 | NDC 00904655161

## 2019-12-11 MED ADMIN — TRAMADOL 50 MG PO TAB [14632]: 50 mg | ORAL | @ 15:00:00 | NDC 51079099101

## 2019-12-11 MED ADMIN — TRAMADOL 50 MG PO TAB [14632]: 50 mg | ORAL | @ 05:00:00 | NDC 51079099101

## 2019-12-11 MED ADMIN — ACETAMINOPHEN 325 MG PO TAB [101]: 650 mg | ORAL | @ 05:00:00 | NDC 00904677361

## 2019-12-11 MED ADMIN — FONDAPARINUX 2.5 MG/0.5 ML SC SYRG [79078]: 2.5 mg | SUBCUTANEOUS | @ 03:00:00 | NDC 67457058200

## 2019-12-11 MED ADMIN — NEBIVOLOL 10 MG PO TAB [166590]: 10 mg | ORAL | @ 14:00:00 | NDC 00456141090

## 2019-12-11 MED ADMIN — BUDESONIDE 3 MG PO CECX [82000]: 6 mg | ORAL | @ 15:00:00 | NDC 65162077810

## 2019-12-11 MED ADMIN — BUMETANIDE 1 MG PO TAB [9310]: 1 mg | ORAL | @ 14:00:00 | Stop: 2019-12-11 | NDC 50268013111

## 2019-12-11 MED ADMIN — PREDNISONE 20 MG PO TAB [6496]: 40 mg | ORAL | @ 15:00:00 | Stop: 2019-12-15 | NDC 00054001820

## 2019-12-11 MED ADMIN — CALCIUM CARBONATE-VITAMIN D3 500 MG(1,250MG) -200 UNIT PO TAB [19483]: 1 | ORAL | @ 20:00:00 | NDC 10006070038

## 2019-12-11 MED ADMIN — KETOROLAC 10 MG PO TAB [10371]: 10 mg | ORAL | @ 03:00:00 | Stop: 2019-12-11 | NDC 00093031401

## 2019-12-11 MED ADMIN — SIMETHICONE 80 MG PO CHEW [7227]: 80 mg | ORAL | @ 05:00:00 | NDC 00904506860

## 2019-12-12 ENCOUNTER — Inpatient Hospital Stay: Admit: 2019-12-12 | Discharge: 2019-12-12 | Payer: BC Managed Care – PPO

## 2019-12-12 MED ADMIN — CALCIUM CARBONATE-VITAMIN D3 500 MG(1,250MG) -200 UNIT PO TAB [19483]: 1 | ORAL | @ 03:00:00 | NDC 10006070038

## 2019-12-12 MED ADMIN — LACTATED RINGERS IV SOLP [4318]: 1000.000 mL | INTRAVENOUS | @ 03:00:00 | Stop: 2019-12-13 | NDC 00338011704

## 2019-12-12 MED ADMIN — TRAMADOL 50 MG PO TAB [14632]: 50 mg | ORAL | @ 03:00:00 | NDC 51079099101

## 2019-12-12 MED ADMIN — ONDANSETRON HCL 4 MG PO TAB [78232]: 4 mg | ORAL | @ 03:00:00 | NDC 00904655161

## 2019-12-12 MED ADMIN — TRAMADOL 50 MG PO TAB [14632]: 50 mg | ORAL | @ 22:00:00 | NDC 51079099101

## 2019-12-12 MED ADMIN — ACETAMINOPHEN 325 MG PO TAB [101]: 650 mg | ORAL | @ 06:00:00 | NDC 00904677361

## 2019-12-12 MED ADMIN — TRAMADOL 50 MG PO TAB [14632]: 50 mg | ORAL | @ 13:00:00 | NDC 51079099101

## 2019-12-12 MED ADMIN — ONDANSETRON HCL 4 MG PO TAB [78232]: 4 mg | ORAL | @ 18:00:00 | NDC 00904655161

## 2019-12-12 MED ADMIN — PREDNISONE 20 MG PO TAB [6496]: 40 mg | ORAL | @ 13:00:00 | Stop: 2019-12-15 | NDC 00054001820

## 2019-12-12 MED ADMIN — PROMETHAZINE 25 MG PO TAB [6622]: 12.5 mg | ORAL | @ 03:00:00 | NDC 00904646161

## 2019-12-12 MED ADMIN — BUDESONIDE 3 MG PO CECX [82000]: 6 mg | ORAL | @ 13:00:00 | NDC 65162077810

## 2019-12-12 MED ADMIN — CALCIUM CARBONATE-VITAMIN D3 500 MG(1,250MG) -200 UNIT PO TAB [19483]: 1 | ORAL | @ 13:00:00 | NDC 10006070038

## 2019-12-12 MED ADMIN — ONDANSETRON HCL 4 MG PO TAB [78232]: 4 mg | ORAL | @ 13:00:00 | NDC 00904655161

## 2019-12-12 MED ADMIN — NEBIVOLOL 10 MG PO TAB [166590]: 10 mg | ORAL | @ 13:00:00 | NDC 00456141090

## 2019-12-12 MED ADMIN — LACTATED RINGERS IV SOLP [4318]: 1000.000 mL | INTRAVENOUS | @ 13:00:00 | Stop: 2019-12-13 | NDC 00338011704

## 2019-12-12 MED ADMIN — ACETAMINOPHEN 325 MG PO TAB [101]: 650 mg | ORAL | @ 03:00:00 | NDC 00904677361

## 2019-12-12 MED ADMIN — LACTATED RINGERS IV SOLP [4318]: 1000.000 mL | INTRAVENOUS | @ 22:00:00 | Stop: 2019-12-13 | NDC 00338011704

## 2019-12-12 MED ADMIN — ONDANSETRON HCL 4 MG PO TAB [78232]: 4 mg | ORAL | @ 22:00:00 | NDC 00904655161

## 2019-12-12 MED ADMIN — FONDAPARINUX 2.5 MG/0.5 ML SC SYRG [79078]: 2.5 mg | SUBCUTANEOUS | @ 03:00:00 | NDC 67457058200

## 2019-12-13 ENCOUNTER — Encounter: Admit: 2019-12-13 | Discharge: 2019-12-13 | Payer: BC Managed Care – PPO

## 2019-12-13 MED ADMIN — NEBIVOLOL 10 MG PO TAB [166590]: 10 mg | ORAL | @ 14:00:00 | NDC 00456141090

## 2019-12-13 MED ADMIN — FONDAPARINUX 2.5 MG/0.5 ML SC SYRG [79078]: 2.5 mg | SUBCUTANEOUS | @ 02:00:00 | NDC 67457058200

## 2019-12-13 MED ADMIN — PREDNISONE 20 MG PO TAB [6496]: 40 mg | ORAL | @ 14:00:00 | Stop: 2019-12-15 | NDC 00054001820

## 2019-12-13 MED ADMIN — CHOLECALCIFEROL (VITAMIN D3) 25 MCG (1,000 UNIT) PO TAB [130074]: 2000 [IU] | ORAL | @ 14:00:00 | NDC 20555003300

## 2019-12-13 MED ADMIN — LACTATED RINGERS IV SOLP [4318]: 1000 mL | INTRAVENOUS | @ 23:00:00 | Stop: 2019-12-15 | NDC 00338011704

## 2019-12-13 MED ADMIN — BUDESONIDE 3 MG PO CECX [82000]: 6 mg | ORAL | @ 14:00:00 | NDC 65162077810

## 2019-12-13 MED ADMIN — TRAMADOL 50 MG PO TAB [14632]: 50 mg | ORAL | @ 14:00:00 | NDC 51079099101

## 2019-12-13 MED ADMIN — ONDANSETRON HCL 4 MG PO TAB [78232]: 4 mg | ORAL | @ 14:00:00 | NDC 00904655161

## 2019-12-13 MED ADMIN — TRAMADOL 50 MG PO TAB [14632]: 50 mg | ORAL | @ 05:00:00 | NDC 51079099101

## 2019-12-13 MED ADMIN — ONDANSETRON HCL 4 MG PO TAB [78232]: 4 mg | ORAL | @ 18:00:00 | NDC 00904655161

## 2019-12-13 MED ADMIN — ONDANSETRON HCL 4 MG PO TAB [78232]: 4 mg | ORAL | @ 23:00:00 | NDC 00904655161

## 2019-12-13 MED ADMIN — LACTATED RINGERS IV SOLP [4318]: 1000.000 mL | INTRAVENOUS | @ 08:00:00 | Stop: 2019-12-13 | NDC 00338011704

## 2019-12-13 MED ADMIN — ONDANSETRON HCL 4 MG PO TAB [78232]: 4 mg | ORAL | @ 02:00:00 | NDC 00904655161

## 2019-12-13 MED ADMIN — CALCIUM CARBONATE-VITAMIN D3 500 MG(1,250MG) -200 UNIT PO TAB [19483]: 1 | ORAL | @ 14:00:00 | NDC 10006070038

## 2019-12-13 MED ADMIN — CALCIUM CARBONATE-VITAMIN D3 500 MG(1,250MG) -200 UNIT PO TAB [19483]: 1 | ORAL | @ 02:00:00 | NDC 10006070038

## 2019-12-13 MED ADMIN — TRAMADOL 50 MG PO TAB [14632]: 50 mg | ORAL | @ 22:00:00 | NDC 51079099101

## 2019-12-14 MED ADMIN — NEBIVOLOL 10 MG PO TAB [166590]: 10 mg | ORAL | @ 14:00:00 | NDC 00456141090

## 2019-12-14 MED ADMIN — BUDESONIDE 3 MG PO CECX [82000]: 6 mg | ORAL | @ 14:00:00 | NDC 65162077810

## 2019-12-14 MED ADMIN — TRAMADOL 50 MG PO TAB [14632]: 50 mg | ORAL | @ 18:00:00 | NDC 51079099101

## 2019-12-14 MED ADMIN — TRAMADOL 50 MG PO TAB [14632]: 50 mg | ORAL | @ 04:00:00 | NDC 51079099101

## 2019-12-14 MED ADMIN — CALCIUM CARBONATE-VITAMIN D3 500 MG(1,250MG) -200 UNIT PO TAB [19483]: 1 | ORAL | @ 15:00:00 | NDC 10006070038

## 2019-12-14 MED ADMIN — INSULIN GLARGINE 100 UNIT/ML (3 ML) SC INJ PEN [163596]: 30 [IU] | SUBCUTANEOUS | @ 17:00:00 | NDC 00088221905

## 2019-12-14 MED ADMIN — ONDANSETRON HCL 4 MG PO TAB [78232]: 4 mg | ORAL | @ 22:00:00 | NDC 00904655161

## 2019-12-14 MED ADMIN — CHOLECALCIFEROL (VITAMIN D3) 25 MCG (1,000 UNIT) PO TAB [130074]: 2000 [IU] | ORAL | @ 15:00:00 | NDC 20555003300

## 2019-12-14 MED ADMIN — FONDAPARINUX 2.5 MG/0.5 ML SC SYRG [79078]: 2.5 mg | SUBCUTANEOUS | @ 02:00:00 | NDC 67457058200

## 2019-12-14 MED ADMIN — ONDANSETRON HCL 4 MG PO TAB [78232]: 4 mg | ORAL | @ 15:00:00 | NDC 00904655161

## 2019-12-14 MED ADMIN — CALCIUM CARBONATE-VITAMIN D3 500 MG(1,250MG) -200 UNIT PO TAB [19483]: 1 | ORAL | @ 02:00:00 | NDC 10006070038

## 2019-12-14 MED ADMIN — ONDANSETRON HCL 4 MG PO TAB [78232]: 4 mg | ORAL | @ 18:00:00 | NDC 00904655161

## 2019-12-14 MED ADMIN — ONDANSETRON HCL 4 MG PO TAB [78232]: 4 mg | ORAL | @ 02:00:00 | NDC 00904655161

## 2019-12-14 MED ADMIN — PREDNISONE 20 MG PO TAB [6496]: 40 mg | ORAL | @ 15:00:00 | Stop: 2019-12-14 | NDC 00054001820

## 2019-12-14 MED ADMIN — POLYETHYLENE GLYCOL 3350 17 GRAM PO PWPK [25424]: 17 g | ORAL | @ 17:00:00 | NDC 00904693186

## 2019-12-15 MED ADMIN — CHOLECALCIFEROL (VITAMIN D3) 25 MCG (1,000 UNIT) PO TAB [130074]: 2000 [IU] | ORAL | @ 13:00:00 | NDC 20555003300

## 2019-12-15 MED ADMIN — TRAMADOL 50 MG PO TAB [14632]: 50 mg | ORAL | @ 11:00:00 | NDC 51079099101

## 2019-12-15 MED ADMIN — ONDANSETRON HCL 4 MG PO TAB [78232]: 4 mg | ORAL | @ 02:00:00 | NDC 00904655161

## 2019-12-15 MED ADMIN — BUDESONIDE 3 MG PO CECX [82000]: 6 mg | ORAL | @ 13:00:00 | NDC 65162077810

## 2019-12-15 MED ADMIN — CALCIUM CARBONATE-VITAMIN D3 500 MG(1,250MG) -200 UNIT PO TAB [19483]: 1 | ORAL | @ 02:00:00 | NDC 10006070038

## 2019-12-15 MED ADMIN — ONDANSETRON HCL 4 MG PO TAB [78232]: 4 mg | ORAL | @ 23:00:00 | NDC 68084022011

## 2019-12-15 MED ADMIN — FONDAPARINUX 2.5 MG/0.5 ML SC SYRG [79078]: 2.5 mg | SUBCUTANEOUS | @ 02:00:00 | NDC 67457058200

## 2019-12-15 MED ADMIN — PREDNISONE 20 MG PO TAB [6496]: 30 mg | ORAL | @ 13:00:00 | Stop: 2019-12-20 | NDC 00054001820

## 2019-12-15 MED ADMIN — CALCIUM CARBONATE-VITAMIN D3 500 MG(1,250MG) -200 UNIT PO TAB [19483]: 1 | ORAL | @ 13:00:00 | NDC 10006070038

## 2019-12-15 MED ADMIN — ONDANSETRON HCL 4 MG PO TAB [78232]: 4 mg | ORAL | @ 18:00:00 | NDC 68084022011

## 2019-12-15 MED ADMIN — ONDANSETRON HCL 4 MG PO TAB [78232]: 4 mg | ORAL | @ 13:00:00 | NDC 00904655161

## 2019-12-15 MED ADMIN — PROMETHAZINE 25 MG PO TAB [6622]: 12.5 mg | ORAL | @ 11:00:00 | NDC 00904646161

## 2019-12-15 MED ADMIN — PREDNISONE 10 MG PO TAB [6494]: 30 mg | ORAL | @ 13:00:00 | Stop: 2019-12-20 | NDC 00054001720

## 2019-12-15 MED ADMIN — TRAMADOL 50 MG PO TAB [14632]: 50 mg | ORAL | @ 02:00:00 | NDC 51079099101

## 2019-12-15 MED ADMIN — NEBIVOLOL 10 MG PO TAB [166590]: 10 mg | ORAL | @ 13:00:00 | NDC 00456141090

## 2019-12-15 MED ADMIN — POLYETHYLENE GLYCOL 3350 17 GRAM PO PWPK [25424]: 17 g | ORAL | @ 13:00:00 | NDC 00904693186

## 2019-12-16 ENCOUNTER — Encounter: Admit: 2019-12-16 | Discharge: 2019-12-16 | Payer: BC Managed Care – PPO

## 2019-12-16 MED ADMIN — CALCIUM CARBONATE-VITAMIN D3 500 MG(1,250MG) -200 UNIT PO TAB [19483]: 1 | ORAL | @ 02:00:00 | NDC 10006070038

## 2019-12-16 MED ADMIN — CHOLECALCIFEROL (VITAMIN D3) 25 MCG (1,000 UNIT) PO TAB [130074]: 2000 [IU] | ORAL | @ 13:00:00 | Stop: 2019-12-16 | NDC 80681016900

## 2019-12-16 MED ADMIN — CALCIUM CARBONATE-VITAMIN D3 500 MG(1,250MG) -200 UNIT PO TAB [19483]: 1 | ORAL | @ 13:00:00 | Stop: 2019-12-16 | NDC 10006070038

## 2019-12-16 MED ADMIN — ONDANSETRON HCL 4 MG PO TAB [78232]: 4 mg | ORAL | @ 17:00:00 | Stop: 2019-12-16 | NDC 00904655161

## 2019-12-16 MED ADMIN — TRAMADOL 50 MG PO TAB [14632]: 50 mg | ORAL | @ 02:00:00 | NDC 51079099101

## 2019-12-16 MED ADMIN — PROMETHAZINE 25 MG PO TAB [6622]: 12.5 mg | ORAL | @ 22:00:00 | Stop: 2019-12-16 | NDC 00904646161

## 2019-12-16 MED ADMIN — INSULIN ASPART 100 UNIT/ML SC FLEXPEN [87504]: 4 [IU] | SUBCUTANEOUS | @ 17:00:00 | Stop: 2019-12-16 | NDC 00169633910

## 2019-12-16 MED ADMIN — PROMETHAZINE 25 MG PO TAB [6622]: 12.5 mg | ORAL | @ 09:00:00 | Stop: 2019-12-16 | NDC 00904646161

## 2019-12-16 MED ADMIN — NEBIVOLOL 10 MG PO TAB [166590]: 10 mg | ORAL | @ 13:00:00 | Stop: 2019-12-16 | NDC 00456141090

## 2019-12-16 MED ADMIN — ONDANSETRON HCL 4 MG PO TAB [78232]: 4 mg | ORAL | @ 13:00:00 | Stop: 2019-12-16 | NDC 00904655161

## 2019-12-16 MED ADMIN — FONDAPARINUX 2.5 MG/0.5 ML SC SYRG [79078]: 2.5 mg | SUBCUTANEOUS | @ 02:00:00 | NDC 67457058200

## 2019-12-16 MED ADMIN — TRAMADOL 50 MG PO TAB [14632]: 50 mg | ORAL | @ 09:00:00 | Stop: 2019-12-16 | NDC 51079099101

## 2019-12-16 MED ADMIN — PREDNISONE 20 MG PO TAB [6496]: 30 mg | ORAL | @ 13:00:00 | Stop: 2019-12-16 | NDC 00054001820

## 2019-12-16 MED ADMIN — BUDESONIDE 3 MG PO CECX [82000]: 6 mg | ORAL | @ 13:00:00 | Stop: 2019-12-16 | NDC 65162077810

## 2019-12-16 MED ADMIN — ONDANSETRON HCL 4 MG PO TAB [78232]: 4 mg | ORAL | @ 02:00:00 | NDC 68084022011

## 2019-12-16 MED ADMIN — PREDNISONE 10 MG PO TAB [6494]: 30 mg | ORAL | @ 13:00:00 | Stop: 2019-12-16 | NDC 00054001720

## 2019-12-16 MED ADMIN — POLYETHYLENE GLYCOL 3350 17 GRAM PO PWPK [25424]: 17 g | ORAL | @ 13:00:00 | Stop: 2019-12-16 | NDC 00904693186

## 2019-12-16 MED ADMIN — PROMETHAZINE 25 MG PO TAB [6622]: 12.5 mg | ORAL | @ 01:00:00 | NDC 00904646161

## 2019-12-16 MED FILL — INSULIN GLARGINE 100 UNIT/ML (3 ML) SC INJ PEN: 100 unit/mL (3 mL) | SUBCUTANEOUS | 30 days supply | Qty: 15 | Fill #1 | Status: CP

## 2019-12-16 MED FILL — INSULIN ASPART 100 UNIT/ML SC FLEXPEN: 100 unit/mL (3 mL) | SUBCUTANEOUS | 30 days supply | Qty: 15 | Status: CN

## 2019-12-16 MED FILL — DAPSONE 100 MG PO TAB: 100 mg | ORAL | 30 days supply | Qty: 30 | Fill #1 | Status: CP

## 2019-12-16 MED FILL — PREDNISONE 10 MG PO TAB: 10 mg | 28 days supply | Qty: 60 | Fill #1 | Status: CP

## 2019-12-16 MED FILL — PROMETHAZINE 12.5 MG PO TAB: 12.5 mg | ORAL | 8 days supply | Qty: 30 | Fill #1 | Status: CP

## 2019-12-16 MED FILL — INSULIN ASPART 100 UNIT/ML SC FLEXPEN: 100 unit/mL (3 mL) | SUBCUTANEOUS | 25 days supply | Qty: 15 | Fill #1 | Status: CP

## 2019-12-17 ENCOUNTER — Encounter: Admit: 2019-12-17 | Discharge: 2019-12-17 | Payer: BC Managed Care – PPO

## 2019-12-17 NOTE — Telephone Encounter
Hospital Discharge Follow Up      Reached Patient:Yes     Admission Information:     Hospital Name: [facility]  Admission Date: 12/08/2019  Discharge Date: 12/16/2019    Admission Diagnosis: Partial SBO  Discharge Diagnosis: SBO, Crohn's disease, HTN, CKD stage 3 GFR 30-59 ml/min, OSA, Type 2 diabetes mellitus without complication with long-term current use of insulin  Has there been a discharge within the last 30 days? No  If yes, reason: N/A  Hospital Services: Unplanned  Today's call is 1(business) days post discharge      Discharge Instruction Review   Did patient receive and understand discharge instructions? Yes    Home Health ordered? No                 Agency name/telephone number: N/A   Has Home Health agency contacted patient? No   Caregiver assistance in the home? No   Are there concerns regarding the patient's ADL'S? Patient utilizes a walker with ambulation.  Is patient a fall risk? Yes    Special diet? No If yes, type: Regular diet      Medication Reconciliation    Changes to pre-hospital medications? Yes   STOP taking:  HumuLIN R U-500 (Conc) Kwikpen 500 unit/mL (3 mL) injection PEN  Were new prescriptions filled?Yes   START taking:  bisacodyl (DULCOLAX)  calcium carbonate (OS-CAL)  cholecalciferol (VITAMIN D-3)  dapsone  insulin aspart (U-100) (NOVOLOG FLEXPEN U-100 INSULIN)  insulin glargine (LANTUS SOLOSTAR U-100 INSULIN)  polyethylene glycol 3350 (MIRALAX)  prednisone (DELTASONE)  promethazine (PHENERGAN)  Meds reviewed and reconciled?Yes  ? atorvastatin (LIPITOR) 40 mg tablet Take one tablet by mouth daily.   ? bisacodyL (DULCOLAX) 10 mg rectal suppository Insert or Apply one suppository to rectal area as directed daily as needed. Use if you have not had a bowel movement in 3 days   ? Blood-Glucose Sensor (DEXCOM G6 SENSOR) devi Use 1 each as directed as directed. Change every 10 days   ? budesonide (ENTOCORT EC) 3 mg capsule TAKE 2 CAPSULES BY MOUTH DAILY.   ? bumetanide (BUMEX) 1 mg tablet TAKE 1 TABLET BY MOUTH EVERY DAY   ? buPROPion XL (WELLBUTRIN XL) 300 mg tablet Take one tablet by mouth every morning. Do not crush or chew.   ? calcium carbonate (OS-CAL) 1250 mg tablet Take one tablet by mouth twice daily. Take while on prednisone, may discontinue once prednisone stopped. This medication may be purchased from over the counter from your preferred pharmacy   ? cholecalciferol (VITAMIN D-3) 1,000 units tablet Take two tablets by mouth daily. This medication may be purchased from over the counter from your preferred pharmacy   ? cyclobenzaprine (FLEXERIL) 10 mg tablet Take 10 mg by mouth daily as needed. .   ? dapsone 100 mg tablet Take one tablet by mouth daily. Take this to prevent infection while your prednisone is 20mg  or higher, may discontinue once your prednisone dose if lower than 20mg    ? DEXCOM G6 RECEIVER misc Use 1 each as directed daily.   ? DEXCOM G6 TRANSMITTER devi Use 1 each as directed as directed. Every 90 days   ? DEXILANT 60 mg capsule TAKE ONE CAPSULE BY MOUTH DAILY. INDICATIONS: GASTROESOPHAGEAL REFLUX DISEASE   ? dicyclomine (BENTYL) 20 mg tablet TAKE 1 TABLET BY MOUTH EVERY 6 HOURS AS NEEDED   ? duloxetine DR (CYMBALTA) 60 mg capsule Take 60 mg by mouth daily with breakfast.   ? famotidine (PEPCID)  20 mg tablet Take one tablet by mouth at bedtime daily.   ? fluticasone propionate (FLONASE) 50 mcg/actuation nasal spray, suspension Apply one spray to each nostril as directed daily. Shake bottle gently before using. (Patient taking differently: Apply 1 spray to each nostril as directed daily as needed. Shake bottle gently before using.)   ? insulin aspart (U-100) (NOVOLOG FLEXPEN U-100 INSULIN) 100 unit/mL (3 mL) injection PEN Inject 16 units to 20 units under the skin three times daily before meals.   ? insulin glargine (LANTUS SOLOSTAR U-100 INSULIN) 100 unit/mL (3 mL) injection PEN Inject thirty Units under the skin daily.   ? insulin pen needles (disposable) (B-D NANO) 32 gauge x 5/32 pen needle Use one each as directed three times daily before meals. Use with insulin injections.   ? nebivolol (BYSTOLIC) 10 mg tablet Take one tablet by mouth daily.   ? polyethylene glycol 3350 (MIRALAX) 17 gram/dose powder Take seventeen g by mouth twice daily.   ? predniSONE (DELTASONE) 10 mg tablet Starting 12/17/2019 take 30mg  by mouth once daily for 3 days followed by 20mg  daily. Continue on this dose until you follow up with your GI physician   ? promethazine (PHENERGAN) 12.5 mg tablet Take one tablet by mouth every 6 hours as needed.   ? spironolactone (ALDACTONE) 50 mg tablet Take 50 mg by mouth daily.   ? sucralfate (CARAFATE) 1 gram tablet Take one tablet by mouth three times daily as needed. Take on an empty stomach. May crush and add water to make slurry   ? temazepam (RESTORIL) 15 mg capsule Take one capsule by mouth at bedtime as needed.   ? traMADoL (ULTRAM) 50 mg tablet Take one tablet by mouth every 6 hours as needed for Pain.   ? vedolizumab (ENTYVIO IV) Administer  through vein every 8 weeks.         Understanding Condition   Having any current symptoms? Yes, Patient states she is still having a lot of abdominal cramping.  Patient states she has not had a BM in 24 hours but feels like she could have one today.  Patient states her stomach is just upset.  Patient states her blood sugar is 187 today and blood pressure 132/79 with heart rate of 71.  Patient understands when to seek additional medical care? Yes   Other instructions provided : Activity as tolerated      Scheduling Follow-up Appointment   Upcoming appointment date and time and with whom scheduled:   Future Appointments   Date Time Provider Department Center   12/24/2019 11:00 AM Annia Belt Becky Sax, MD [facility] IM   12/24/2019  4:00 PM Martie Round, PA-C [facility] IM   01/13/2020 10:30 AM [id number] None   02/02/2020  8:30 AM Duthuluru, Madelaine Etienne, MD [facility] IM   02/15/2020  2:00 PM Elmo Putt, MD [facility] IM   03/09/2020 10:15 AM [id number] [facility] None   03/13/2020  1:00 PM Reubin Milan, MD [facility] Exam     PCP appointment scheduled?Yes, Date: 12/24/2019   PCP primary location: [facility]  Specialist appointment scheduled? Yes, with diabetes 12/24/2019; Pulmonary 02/02/2020; Laurette Schimke 02/15/2020; Cardiology 03/13/2020  Both PCP and Specialist appointment scheduled: Yes  Is assistance with transportation needed?No   MyChart message sent? Active in MyChart. No message sent.     Yancey Flemings, RN

## 2019-12-20 ENCOUNTER — Encounter: Admit: 2019-12-20 | Discharge: 2019-12-20 | Payer: BC Managed Care – PPO

## 2019-12-20 MED ORDER — ONDANSETRON HCL 4 MG PO TAB
4 mg | ORAL_TABLET | ORAL | 0 refills | 8.00000 days | Status: AC
Start: 2019-12-20 — End: ?
  Filled 2019-12-20: qty 30, 6d supply, fill #1

## 2019-12-20 NOTE — Telephone Encounter
Ondansetron was given inpatient but is longer on her medication list.

## 2019-12-21 ENCOUNTER — Encounter: Admit: 2019-12-21 | Discharge: 2019-12-21 | Payer: BC Managed Care – PPO

## 2019-12-22 ENCOUNTER — Encounter: Admit: 2019-12-22 | Discharge: 2019-12-22 | Payer: BC Managed Care – PPO

## 2019-12-22 NOTE — Telephone Encounter
Fax received requesting chart notes for patient's CGM supplies.  Chart notes from [facility] on 08/31/19 faxed successfully.

## 2019-12-24 ENCOUNTER — Ambulatory Visit: Admit: 2019-12-24 | Discharge: 2019-12-25 | Payer: BC Managed Care – PPO

## 2019-12-24 ENCOUNTER — Encounter: Admit: 2019-12-24 | Discharge: 2019-12-24 | Payer: BC Managed Care – PPO

## 2019-12-24 ENCOUNTER — Ambulatory Visit: Admit: 2019-12-24 | Discharge: 2019-12-24 | Payer: BC Managed Care – PPO

## 2019-12-24 DIAGNOSIS — K639 Disease of intestine, unspecified: Secondary | ICD-10-CM

## 2019-12-24 DIAGNOSIS — E119 Type 2 diabetes mellitus without complications: Secondary | ICD-10-CM

## 2019-12-24 DIAGNOSIS — K5090 Crohn's disease, unspecified, without complications: Secondary | ICD-10-CM

## 2019-12-24 DIAGNOSIS — E781 Pure hyperglyceridemia: Secondary | ICD-10-CM

## 2019-12-24 DIAGNOSIS — M797 Fibromyalgia: Secondary | ICD-10-CM

## 2019-12-24 DIAGNOSIS — J302 Other seasonal allergic rhinitis: Secondary | ICD-10-CM

## 2019-12-24 DIAGNOSIS — I8290 Acute embolism and thrombosis of unspecified vein: Secondary | ICD-10-CM

## 2019-12-24 DIAGNOSIS — D699 Hemorrhagic condition, unspecified: Secondary | ICD-10-CM

## 2019-12-24 DIAGNOSIS — I10 Essential (primary) hypertension: Secondary | ICD-10-CM

## 2019-12-24 DIAGNOSIS — E1165 Type 2 diabetes mellitus with hyperglycemia: Secondary | ICD-10-CM

## 2019-12-24 DIAGNOSIS — K219 Gastro-esophageal reflux disease without esophagitis: Secondary | ICD-10-CM

## 2019-12-24 DIAGNOSIS — F324 Major depressive disorder, single episode, in partial remission: Secondary | ICD-10-CM

## 2019-12-24 DIAGNOSIS — D4959 Neoplasm of unspecified behavior of other genitourinary organ: Secondary | ICD-10-CM

## 2019-12-24 DIAGNOSIS — K7581 Nonalcoholic steatohepatitis (NASH): Secondary | ICD-10-CM

## 2019-12-24 DIAGNOSIS — G40909 Epilepsy, unspecified, not intractable, without status epilepticus: Secondary | ICD-10-CM

## 2019-12-24 DIAGNOSIS — E139 Other specified diabetes mellitus without complications: Secondary | ICD-10-CM

## 2019-12-24 DIAGNOSIS — G43909 Migraine, unspecified, not intractable, without status migrainosus: Secondary | ICD-10-CM

## 2019-12-24 DIAGNOSIS — R42 Dizziness and giddiness: Secondary | ICD-10-CM

## 2019-12-24 DIAGNOSIS — IMO0002 Ulcer: Secondary | ICD-10-CM

## 2019-12-24 MED ORDER — DULOXETINE 60 MG PO CPDR
60 mg | ORAL_CAPSULE | Freq: Every day | ORAL | 3 refills | 60.00000 days | Status: AC
Start: 2019-12-24 — End: ?

## 2019-12-24 MED ORDER — BUPROPION XL 300 MG PO TB24
300 mg | ORAL_TABLET | Freq: Every morning | ORAL | 3 refills | Status: AC
Start: 2019-12-24 — End: ?

## 2019-12-24 NOTE — Progress Notes
Subjective:       Diabetes      Tina Snyder is a 54 y.o. female who was seen today as a hospital follow up for type 2 DM.     Interval changes:   She was admitted to [facility] from 8/18-8/26 for small bowel obstruction. She had LUQ pain and was found to have elevated lipase-- was diagnosed with pancreatitis. She was placed on steroid taper of prednisone for her Crohn's, 5 days of 40mg , then 5 days 30mg , then continue 20mg  indefinitely until follow up with gastroenterology (seeing them in October).     Her intake was much lower over this period, and inpatient lantus and novolog were used (rather than her PTA U-500). Since her discharge, she has been taking Lantus 30 units, Novolog 20 units with meals (and snacks). She continues to have poor intake due to abdominal pain and nausea-- she is mostly eating crackers and drinking regular sprite.   ?  Summary of Glucose Control:??  Lab Results   Component Value Date    HGBA1C 6.8 (H) 10/14/2019    GLUPOC 231 (H) 12/16/2019     Dexcom download:  -----------------------------  [name]  Date of Birth: 1965-05-22  Generated at: Fri, Dec 24, 2019 5:40 PM CDT  Reporting period: Thu Nov 25, 2019 - Fri Dec 24, 2019  -----------------------------  Glucose Details  Average glucose: 183 mg/dL  Standard deviation: 67 mg/dL  GMI: 1.6%  -----------------------------  Time in Range  Very High: 14%  High: 37%  In range: 46%  Low: 2%  Very Low: 0%    Target Range  70-180 mg/dL    -----------------------------  CGM Details  Sensor usage: 70%  Days with CGM data: 21/30    Trends:   - Hyperglycemia most common in the afternoons and evenings   - Stable BG overnight   ?  Type 2 DM:   Dx: 2012  Current regimen:  Lantus 30 units daily, Novolog 20 units with meals   Frequency of hypoglycemia fasting AM today was 54.  ? How many meals per day??3 meals with snack ?  Weight changes:   Exercise? no   Lipid: lipitor 40 mg daily      Family History of diabetes: none, son with celiac  Social History: lives with her husband, [place], in [place]. Does not drink or smoke.    ?  Medical History:   Diagnosis Date   ? Bleeding tendency (HCC)    ? 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 Bowel disease    ? Crohn disease (HCC)    ? Dizziness    ? DM (diabetes mellitus), secondary-steroid-induced 2012   ? Fibromyalgia    ? GERD (gastroesophageal reflux disease)    ? Hypertension    ? Hypertensive heart disease    ? Migraines    ? Seasonal allergic reaction    ? Seizure disorder (HCC)    ? Steatohepatitis     felt secondary to medications   ? Type 2 diabetes mellitus without complication, with long-term current use of insulin (HCC) 10/22/2017   ? Ulcer    ? Vaginal tumor          Review of Systems  Positive for nausea, LUQ and back pain      Objective:         ? atorvastatin (LIPITOR) 40 mg tablet Take one tablet by mouth daily.   ? bisacodyL (DULCOLAX) 10 mg rectal  suppository Insert or Apply one suppository to rectal area as directed daily as needed. Use if you have not had a bowel movement in 3 days   ? Blood-Glucose Sensor (DEXCOM G6 SENSOR) devi Use 1 each as directed as directed. Change every 10 days   ? budesonide (ENTOCORT EC) 3 mg capsule TAKE 2 CAPSULES BY MOUTH DAILY.   ? bumetanide (BUMEX) 1 mg tablet TAKE 1 TABLET BY MOUTH EVERY DAY   ? buPROPion XL (WELLBUTRIN XL) 300 mg tablet Take one tablet by mouth every morning. Do not crush or chew.   ? calcium carbonate (OS-CAL) 1250 mg tablet Take one tablet by mouth twice daily. Take while on prednisone, may discontinue once prednisone stopped. This medication may be purchased from over the counter from your preferred pharmacy   ? cholecalciferol (VITAMIN D-3) 1,000 units tablet Take two tablets by mouth daily. This medication may be purchased from over the counter from your preferred pharmacy   ? cyclobenzaprine (FLEXERIL) 10 mg tablet Take 10 mg by mouth daily as needed. .   ? dapsone 100 mg tablet Take one tablet by mouth daily. Take this to prevent infection while your prednisone is 20mg  or higher, may discontinue once your prednisone dose if lower than 20mg    ? DEXCOM G6 RECEIVER misc Use 1 each as directed daily.   ? DEXCOM G6 TRANSMITTER devi Use 1 each as directed as directed. Every 90 days   ? DEXILANT 60 mg capsule TAKE ONE CAPSULE BY MOUTH DAILY. INDICATIONS: GASTROESOPHAGEAL REFLUX DISEASE   ? dicyclomine (BENTYL) 20 mg tablet TAKE 1 TABLET BY MOUTH EVERY 6 HOURS AS NEEDED   ? duloxetine DR (CYMBALTA) 60 mg capsule Take one capsule by mouth daily with breakfast.   ? famotidine (PEPCID) 20 mg tablet Take one tablet by mouth at bedtime daily.   ? fluticasone propionate (FLONASE) 50 mcg/actuation nasal spray, suspension Apply one spray to each nostril as directed daily. Shake bottle gently before using. (Patient taking differently: Apply 1 spray to each nostril as directed daily as needed. Shake bottle gently before using.)   ? insulin aspart (U-100) (NOVOLOG FLEXPEN U-100 INSULIN) 100 unit/mL (3 mL) injection PEN Inject 16 units to 20 units under the skin three times daily before meals. (Patient taking differently: Inject 20 Units under the skin three times daily before meals.)   ? insulin glargine (LANTUS SOLOSTAR U-100 INSULIN) 100 unit/mL (3 mL) injection PEN Inject thirty Units under the skin daily.   ? insulin pen needles (disposable) (B-D NANO) 32 gauge x 5/32 pen needle Use one each as directed three times daily before meals. Use with insulin injections.   ? nebivolol (BYSTOLIC) 10 mg tablet Take one tablet by mouth daily.   ? ondansetron HCL (ZOFRAN) 4 mg tablet Take one tablet by mouth every 8 hours.   ? polyethylene glycol 3350 (MIRALAX) 17 gram/dose powder Take seventeen g by mouth twice daily.   ? predniSONE (DELTASONE) 10 mg tablet Starting 12/17/2019 take 30mg  by mouth once daily for 3 days followed by 20mg  daily. Continue on this dose until you follow up with your GI physician   ? promethazine (PHENERGAN) 12.5 mg tablet Take one tablet by mouth every 6 hours as needed.   ? spironolactone (ALDACTONE) 50 mg tablet Take 50 mg by mouth daily.   ? sucralfate (CARAFATE) 1 gram tablet Take one tablet by mouth three times daily as needed. Take on an empty stomach. May crush and add water to make slurry   ?  temazepam (RESTORIL) 15 mg capsule Take one capsule by mouth at bedtime as needed.   ? traMADoL (ULTRAM) 50 mg tablet Take one tablet by mouth every 6 hours as needed for Pain.   ? vedolizumab (ENTYVIO IV) Administer  through vein every 8 weeks.     Telehealth Patient Reported Vitals     Row Name 12/24/19 1156                Pain Score  SEVEN        Pain Loc  ABDOMEN        Patient Position  Sitting        BP Source  Arm, Left Upper              Physical Exam  Vitals and nursing note reviewed.   Constitutional:       General: She is not in acute distress.     Appearance: Normal appearance. She is not ill-appearing.   HENT:      Head: Normocephalic and atraumatic.      Nose: Nose normal.   Eyes:      Conjunctiva/sclera: Conjunctivae normal.   Neurological:      Mental Status: She is alert and oriented to person, place, and time.   Psychiatric:         Mood and Affect: Mood normal.         Behavior: Behavior normal.         Thought Content: Thought content normal.         Judgment: Judgment normal.     Diabetic Foot Exam       Bilateral vascular, sensation, integument are normal:  Yes  Labs:   Comprehensive Metabolic Profile    Lab Results   Component Value Date/Time    NA 137 12/16/2019 06:01 AM    K 3.9 12/16/2019 06:01 AM    CL 104 12/16/2019 06:01 AM    CO2 23 12/16/2019 06:01 AM    GAP 10 12/16/2019 06:01 AM    BUN 22 12/16/2019 06:01 AM    CR 1.06 (H) 12/16/2019 06:01 AM    GLU 113 (H) 12/16/2019 06:01 AM    Lab Results   Component Value Date/Time    CA 9.2 12/16/2019 06:01 AM    PO4 3.8 12/16/2019 06:01 AM    ALBUMIN 3.8 12/16/2019 06:01 AM    TOTPROT 6.3 12/16/2019 06:01 AM    ALKPHOS 141 (H) 12/16/2019 06:01 AM    AST 14 12/16/2019 06:01 AM    ALT 22 12/16/2019 06:01 AM    TOTBILI 0.5 12/16/2019 06:01 AM    GFR 54 (L) 12/16/2019 06:01 AM    GFRAA >60 12/16/2019 06:01 AM        No results found for: MCALB24   Microalbumin/CR ratio Urine   Date Value Ref Range Status   03/27/2018 28.40 <30 ug/mg Final     Comment:     NOTE NEW REFERENCE RANGES     Microalbumin, Random   Date Value Ref Range Status   03/27/2018 7.1 <19 MCG/ML Final     Lab Results   Component Value Date    CHOL 155 12/12/2019    TRIG 235 (H) 12/12/2019    HDL 21 (L) 12/12/2019    LDL 101 (H) 12/12/2019    VLDL 47 12/12/2019    NONHDLCHOL 134 12/12/2019      TSH   Date Value Ref Range Status   10/14/2019 3.02 0.35 - 5.00 MCU/ML Final  Hemoglobin A1C   Date Value Ref Range Status   10/14/2019 6.8 (H) 4.0 - 6.0 % Final     Comment:     The ADA recommends that most patients with type 1 and type 2 diabetes maintain   an A1c level <7%.     11/16/2018 9.1 (H) 4.0 - 6.0 % Final     Comment:     The ADA recommends that most patients with type 1 and type 2 diabetes maintain   an A1c level <7%.     10/24/2015 9.3 (H) 4.0 - 6.0 % Final     Comment:     The ADA recommends that most patients with type 1 and type 2 diabetes maintain   an A1c level <7%.       Poc Hemoglobin A1C   Date Value Ref Range Status   03/26/2019 7.5 (H) 4 - 6 % Final   03/27/2018 9.2 % Final   11/03/2017 7.6 % Final         Lab Results   Component Value Date    PLTCT 242 12/16/2019               Assessment and Plan:  Diabetes mellitus type 2,  uncontrolled  ? Target A1c <7% without significant hypoglycemia  ? Complicated by: Nephropathy, poorly healing wounds, pancreatitis   ? On budesonide and prednisone 20 mg   ?  Plan:  ? Increase lunch and dinner novolog to 22 units, continue 20 U with breakfast   ? Continue lantus 30 U daily   ? Discussed patient's BMI with her.  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    ? We have been avoiding SGLT2/GLP-1 in this patient due to recurrent yeast infections, Crohn's. Will continue to avoid GLP1 due to hx of pancreatitis.   ? Discussed rule of 15 and how to tx hypoglycemia  ? Reminded pt to rotate insulin sites    Diabetic complications - prevention and management:  - Annual labs (electrolytes and renal function): Last done 07/2019  - Annual eye exam: Last done 04/2018.  Retinopathy - none  - Hyperlipidemia: hypertriglyceridemia. Lipid panel: Last done 11/2019.  Elevated TG. She relays that statin was not restarted since hospitalization-- reached out to Dr. Annia Belt-- plan to restart statin and consider vascepa.          - Statin: yes         - Discussed dietary modifications (low cholesterol)  - Nephropathy: yes, CKD 3. Annual Urine microalbumin/Cr: 03/2018-- WNL  - Foot exam / monofilament exam (recommended annually): Last done today         - Discussed foot care. No issues at this time  - Diabetic Educator and/or nutritionist visit (recommended annually): scheduled with Pattie Leuyot RD [facility]     RTC in 1 month-- anticipate insulin needs changing as intake improves    Patient Instructions   Our plan:   - Continue lantus 30 units daily   - Increase Novolog to 22 units with lunch and dinner, continue 20 U with breakfast   < 150: no extra   151-200: 2 extra units   201-250: 4 extra units  251-300: 6 extra units   301-350: 8 extra units   351-400: 10 extra units   > 401: 12 extra units         Konnor Jorden  Ethelda Chick, PA-C                             Total time 45 minutes.  Estimated counseling time 30 minutes.  Counseled extensively regarding diabetes management.  High complexity

## 2019-12-27 ENCOUNTER — Encounter: Admit: 2019-12-27 | Discharge: 2019-12-27 | Payer: BC Managed Care – PPO

## 2019-12-27 DIAGNOSIS — G40909 Epilepsy, unspecified, not intractable, without status epilepticus: Secondary | ICD-10-CM

## 2019-12-27 DIAGNOSIS — J302 Other seasonal allergic rhinitis: Secondary | ICD-10-CM

## 2019-12-27 DIAGNOSIS — D699 Hemorrhagic condition, unspecified: Secondary | ICD-10-CM

## 2019-12-27 DIAGNOSIS — IMO0002 Ulcer: Secondary | ICD-10-CM

## 2019-12-27 DIAGNOSIS — K639 Disease of intestine, unspecified: Secondary | ICD-10-CM

## 2019-12-27 DIAGNOSIS — E119 Type 2 diabetes mellitus without complications: Secondary | ICD-10-CM

## 2019-12-27 DIAGNOSIS — R42 Dizziness and giddiness: Secondary | ICD-10-CM

## 2019-12-27 DIAGNOSIS — K219 Gastro-esophageal reflux disease without esophagitis: Secondary | ICD-10-CM

## 2019-12-27 DIAGNOSIS — I8290 Acute embolism and thrombosis of unspecified vein: Secondary | ICD-10-CM

## 2019-12-27 DIAGNOSIS — D4959 Neoplasm of unspecified behavior of other genitourinary organ: Secondary | ICD-10-CM

## 2019-12-27 DIAGNOSIS — K5090 Crohn's disease, unspecified, without complications: Secondary | ICD-10-CM

## 2019-12-27 DIAGNOSIS — I10 Essential (primary) hypertension: Secondary | ICD-10-CM

## 2019-12-27 DIAGNOSIS — M797 Fibromyalgia: Secondary | ICD-10-CM

## 2019-12-27 DIAGNOSIS — K7581 Nonalcoholic steatohepatitis (NASH): Secondary | ICD-10-CM

## 2019-12-27 DIAGNOSIS — G43909 Migraine, unspecified, not intractable, without status migrainosus: Secondary | ICD-10-CM

## 2019-12-27 DIAGNOSIS — E139 Other specified diabetes mellitus without complications: Secondary | ICD-10-CM

## 2019-12-28 ENCOUNTER — Encounter: Admit: 2019-12-28 | Discharge: 2019-12-28 | Payer: BC Managed Care – PPO

## 2019-12-28 DIAGNOSIS — E119 Type 2 diabetes mellitus without complications: Secondary | ICD-10-CM

## 2019-12-28 DIAGNOSIS — I8290 Acute embolism and thrombosis of unspecified vein: Secondary | ICD-10-CM

## 2019-12-28 DIAGNOSIS — G43909 Migraine, unspecified, not intractable, without status migrainosus: Secondary | ICD-10-CM

## 2019-12-28 DIAGNOSIS — IMO0002 Ulcer: Secondary | ICD-10-CM

## 2019-12-28 DIAGNOSIS — K639 Disease of intestine, unspecified: Secondary | ICD-10-CM

## 2019-12-28 DIAGNOSIS — I10 Essential (primary) hypertension: Secondary | ICD-10-CM

## 2019-12-28 DIAGNOSIS — G40909 Epilepsy, unspecified, not intractable, without status epilepticus: Secondary | ICD-10-CM

## 2019-12-28 DIAGNOSIS — J302 Other seasonal allergic rhinitis: Secondary | ICD-10-CM

## 2019-12-28 DIAGNOSIS — M797 Fibromyalgia: Secondary | ICD-10-CM

## 2019-12-28 DIAGNOSIS — D4959 Neoplasm of unspecified behavior of other genitourinary organ: Secondary | ICD-10-CM

## 2019-12-28 DIAGNOSIS — D699 Hemorrhagic condition, unspecified: Secondary | ICD-10-CM

## 2019-12-28 DIAGNOSIS — R42 Dizziness and giddiness: Secondary | ICD-10-CM

## 2019-12-28 DIAGNOSIS — E139 Other specified diabetes mellitus without complications: Secondary | ICD-10-CM

## 2019-12-28 DIAGNOSIS — K7581 Nonalcoholic steatohepatitis (NASH): Secondary | ICD-10-CM

## 2019-12-28 DIAGNOSIS — K5090 Crohn's disease, unspecified, without complications: Secondary | ICD-10-CM

## 2019-12-28 DIAGNOSIS — K219 Gastro-esophageal reflux disease without esophagitis: Secondary | ICD-10-CM

## 2019-12-29 ENCOUNTER — Encounter: Admit: 2019-12-29 | Discharge: 2019-12-29 | Payer: BC Managed Care – PPO

## 2019-12-30 ENCOUNTER — Encounter: Admit: 2019-12-30 | Discharge: 2019-12-30 | Payer: BC Managed Care – PPO

## 2019-12-31 ENCOUNTER — Encounter: Admit: 2019-12-31 | Discharge: 2019-12-31 | Payer: BC Managed Care – PPO

## 2019-12-31 MED ORDER — ONDANSETRON HCL 4 MG PO TAB
4 mg | ORAL_TABLET | ORAL | 0 refills | 8.00000 days | Status: AC
Start: 2019-12-31 — End: ?

## 2019-12-31 MED ORDER — ONDANSETRON HCL 4 MG PO TAB
ORAL_TABLET | Freq: Three times a day (TID) | 1 refills
Start: 2019-12-31 — End: ?

## 2019-12-31 NOTE — Telephone Encounter
Last filled #30 tablets x 0 refills on 12/21/2019. Emilia Beck, RN

## 2020-01-03 ENCOUNTER — Encounter: Admit: 2020-01-03 | Discharge: 2020-01-03 | Payer: BC Managed Care – PPO

## 2020-01-05 ENCOUNTER — Encounter: Admit: 2020-01-05 | Discharge: 2020-01-05 | Payer: BC Managed Care – PPO

## 2020-01-05 MED ORDER — INSULIN ASPART 100 UNIT/ML SC FLEXPEN
20 [IU] | Freq: Three times a day (TID) | SUBCUTANEOUS | 3 refills | 30.00000 days | Status: AC
Start: 2020-01-05 — End: ?

## 2020-01-06 ENCOUNTER — Encounter: Admit: 2020-01-06 | Discharge: 2020-01-06 | Payer: BC Managed Care – PPO

## 2020-01-07 ENCOUNTER — Encounter: Admit: 2020-01-07 | Discharge: 2020-01-07 | Payer: BC Managed Care – PPO

## 2020-01-07 MED ORDER — PREDNISONE 10 MG PO TAB
20 mg | ORAL_TABLET | Freq: Every day | ORAL | 1 refills | Status: AC
Start: 2020-01-07 — End: ?

## 2020-01-07 MED ORDER — DAPSONE 100 MG PO TAB
100 mg | ORAL_TABLET | Freq: Every day | ORAL | 1 refills | Status: AC
Start: 2020-01-07 — End: ?

## 2020-01-11 ENCOUNTER — Encounter: Admit: 2020-01-11 | Discharge: 2020-01-11 | Payer: BC Managed Care – PPO

## 2020-01-11 DIAGNOSIS — K5080 Crohn's disease of both small and large intestine without complications: Secondary | ICD-10-CM

## 2020-01-11 MED ORDER — TRAMADOL 50 MG PO TAB
ORAL_TABLET | Freq: Four times a day (QID) | 0 refills | PRN
Start: 2020-01-11 — End: ?

## 2020-01-12 ENCOUNTER — Encounter: Admit: 2020-01-12 | Discharge: 2020-01-12 | Payer: BC Managed Care – PPO

## 2020-01-12 DIAGNOSIS — K5080 Crohn's disease of both small and large intestine without complications: Secondary | ICD-10-CM

## 2020-01-12 MED ORDER — TRAMADOL 50 MG PO TAB
1 | ORAL_TABLET | ORAL | 0 refills | Status: AC | PRN
Start: 2020-01-12 — End: ?

## 2020-01-13 ENCOUNTER — Encounter: Admit: 2020-01-13 | Discharge: 2020-01-13 | Payer: BC Managed Care – PPO

## 2020-01-13 ENCOUNTER — Ambulatory Visit: Admit: 2020-01-13 | Discharge: 2020-01-14 | Payer: BC Managed Care – PPO

## 2020-01-13 DIAGNOSIS — E119 Type 2 diabetes mellitus without complications: Secondary | ICD-10-CM

## 2020-01-13 DIAGNOSIS — K7581 Nonalcoholic steatohepatitis (NASH): Secondary | ICD-10-CM

## 2020-01-13 DIAGNOSIS — IMO0002 Ulcer: Secondary | ICD-10-CM

## 2020-01-13 DIAGNOSIS — D699 Hemorrhagic condition, unspecified: Secondary | ICD-10-CM

## 2020-01-13 DIAGNOSIS — R42 Dizziness and giddiness: Secondary | ICD-10-CM

## 2020-01-13 DIAGNOSIS — D4959 Neoplasm of unspecified behavior of other genitourinary organ: Secondary | ICD-10-CM

## 2020-01-13 DIAGNOSIS — K5090 Crohn's disease, unspecified, without complications: Secondary | ICD-10-CM

## 2020-01-13 DIAGNOSIS — G40909 Epilepsy, unspecified, not intractable, without status epilepticus: Secondary | ICD-10-CM

## 2020-01-13 DIAGNOSIS — K639 Disease of intestine, unspecified: Secondary | ICD-10-CM

## 2020-01-13 DIAGNOSIS — E139 Other specified diabetes mellitus without complications: Secondary | ICD-10-CM

## 2020-01-13 DIAGNOSIS — G43909 Migraine, unspecified, not intractable, without status migrainosus: Secondary | ICD-10-CM

## 2020-01-13 DIAGNOSIS — J302 Other seasonal allergic rhinitis: Secondary | ICD-10-CM

## 2020-01-13 DIAGNOSIS — I10 Essential (primary) hypertension: Secondary | ICD-10-CM

## 2020-01-13 DIAGNOSIS — M797 Fibromyalgia: Secondary | ICD-10-CM

## 2020-01-13 DIAGNOSIS — K219 Gastro-esophageal reflux disease without esophagitis: Secondary | ICD-10-CM

## 2020-01-13 DIAGNOSIS — I8290 Acute embolism and thrombosis of unspecified vein: Secondary | ICD-10-CM

## 2020-01-13 MED ORDER — ACETAMINOPHEN 500 MG PO TAB
500 mg | Freq: Once | ORAL | 0 refills
Start: 2020-01-13 — End: ?

## 2020-01-13 MED ORDER — VEDOLIZUMAB IVPB
300 mg | Freq: Once | INTRAVENOUS | 0 refills
Start: 2020-01-13 — End: ?

## 2020-01-13 MED ORDER — ACETAMINOPHEN 500 MG PO TAB
500 mg | Freq: Once | ORAL | 0 refills | Status: CP
Start: 2020-01-13 — End: ?
  Administered 2020-01-13: 16:00:00 500 mg via ORAL

## 2020-01-13 MED ORDER — DIPHENHYDRAMINE HCL 50 MG/ML IJ SOLN
25-50 mg | Freq: Once | INTRAVENOUS | 0 refills
Start: 2020-01-13 — End: ?

## 2020-01-13 MED ORDER — DIPHENHYDRAMINE HCL 50 MG/ML IJ SOLN
25-50 mg | Freq: Once | INTRAVENOUS | 0 refills | Status: CP
Start: 2020-01-13 — End: ?
  Administered 2020-01-13: 16:00:00 50 mg via INTRAVENOUS

## 2020-01-13 MED ORDER — VEDOLIZUMAB IVPB
300 mg | Freq: Once | INTRAVENOUS | 0 refills | Status: CP
Start: 2020-01-13 — End: ?
  Administered 2020-01-13 (×2): 300 mg via INTRAVENOUS

## 2020-01-13 MED ADMIN — DIPHENHYDRAMINE HCL 50 MG/ML IJ SOLN [2508]: 50 mg | INTRAVENOUS | @ 17:00:00 | Stop: 2020-01-13 | NDC 00641037621

## 2020-01-13 NOTE — Progress Notes
Patient had itching and redness on face and chin. Benadryl 50 mg given. The itching and redness resolved. Pt completed infusion without further reaction.

## 2020-01-14 DIAGNOSIS — K50918 Crohn's disease, unspecified, with other complication: Secondary | ICD-10-CM

## 2020-01-18 ENCOUNTER — Encounter: Admit: 2020-01-18 | Discharge: 2020-01-18 | Payer: BC Managed Care – PPO

## 2020-01-18 MED ORDER — ONDANSETRON HCL 4 MG PO TAB
4 mg | ORAL_TABLET | ORAL | 0 refills | 8.00000 days | Status: AC
Start: 2020-01-18 — End: ?

## 2020-01-20 ENCOUNTER — Encounter: Admit: 2020-01-20 | Discharge: 2020-01-20 | Payer: BC Managed Care – PPO

## 2020-01-20 NOTE — Telephone Encounter
Patient's insurance will only pay for 18 tablets per 21 days on ondansetron. Prior authorization initiated on [email or web address]. Awaiting response. Emilia Beck, RN      Key: Derrek [id number]

## 2020-01-21 ENCOUNTER — Encounter: Admit: 2020-01-21 | Discharge: 2020-01-21 | Payer: BC Managed Care – PPO

## 2020-01-21 DIAGNOSIS — K219 Gastro-esophageal reflux disease without esophagitis: Secondary | ICD-10-CM

## 2020-01-21 MED ORDER — PANTOPRAZOLE 40 MG PO TBEC
40 mg | ORAL_TABLET | Freq: Every day | ORAL | 0 refills | 90.00000 days | Status: AC
Start: 2020-01-21 — End: ?

## 2020-01-21 MED ORDER — PROMETHAZINE 12.5 MG PO TAB
ORAL_TABLET | Freq: Four times a day (QID) | 1 refills | PRN
Start: 2020-01-21 — End: ?

## 2020-01-21 NOTE — Telephone Encounter
Received refill request on phenergan from [facility]. Patient originally requested this prescription as insurance would not cover zofran as it was too soon to fill. Phenergan prescription #30 x 1 refill was sent in and patient picked up #30 tablets on 01/07/2020 and she picked up the refill on 01/12/2020. Patient requested refill of zofran and picked up #18 tablets on 01/18/2020. Refusing phenergan prescription at this time. Emilia Beck, RN

## 2020-01-21 NOTE — Telephone Encounter
Spoke w/ [facility].  Looks like both promethazine and zofran have had frequent refills. She reports ongoing nausea after eating.  She is trying to focus on small frequent meals. Only taking zofran at night. Promethazine less helpful. Continues protonix not famotidine.  Not taking NSAIDs.  Resting working on mental health and overall slow improvement. Confirmed she has GI fu October 18th.   She is ok w/ d/c of promethazine to simplify regimen.

## 2020-01-26 ENCOUNTER — Encounter: Admit: 2020-01-26 | Discharge: 2020-01-26 | Payer: BC Managed Care – PPO

## 2020-01-27 ENCOUNTER — Encounter: Admit: 2020-01-27 | Discharge: 2020-01-27 | Payer: BC Managed Care – PPO

## 2020-01-27 ENCOUNTER — Ambulatory Visit: Admit: 2020-01-27 | Discharge: 2020-01-28 | Payer: BC Managed Care – PPO

## 2020-01-27 DIAGNOSIS — I10 Essential (primary) hypertension: Secondary | ICD-10-CM

## 2020-01-27 DIAGNOSIS — I8290 Acute embolism and thrombosis of unspecified vein: Secondary | ICD-10-CM

## 2020-01-27 DIAGNOSIS — E1165 Type 2 diabetes mellitus with hyperglycemia: Secondary | ICD-10-CM

## 2020-01-27 DIAGNOSIS — E139 Other specified diabetes mellitus without complications: Secondary | ICD-10-CM

## 2020-01-27 DIAGNOSIS — E119 Type 2 diabetes mellitus without complications: Secondary | ICD-10-CM

## 2020-01-27 DIAGNOSIS — R42 Dizziness and giddiness: Secondary | ICD-10-CM

## 2020-01-27 DIAGNOSIS — K7581 Nonalcoholic steatohepatitis (NASH): Secondary | ICD-10-CM

## 2020-01-27 DIAGNOSIS — M797 Fibromyalgia: Secondary | ICD-10-CM

## 2020-01-27 DIAGNOSIS — G43909 Migraine, unspecified, not intractable, without status migrainosus: Secondary | ICD-10-CM

## 2020-01-27 DIAGNOSIS — K639 Disease of intestine, unspecified: Secondary | ICD-10-CM

## 2020-01-27 DIAGNOSIS — K219 Gastro-esophageal reflux disease without esophagitis: Secondary | ICD-10-CM

## 2020-01-27 DIAGNOSIS — G40909 Epilepsy, unspecified, not intractable, without status epilepticus: Secondary | ICD-10-CM

## 2020-01-27 DIAGNOSIS — J302 Other seasonal allergic rhinitis: Secondary | ICD-10-CM

## 2020-01-27 DIAGNOSIS — K5090 Crohn's disease, unspecified, without complications: Secondary | ICD-10-CM

## 2020-01-27 DIAGNOSIS — IMO0002 Ulcer: Secondary | ICD-10-CM

## 2020-01-27 DIAGNOSIS — D699 Hemorrhagic condition, unspecified: Secondary | ICD-10-CM

## 2020-01-27 DIAGNOSIS — D4959 Neoplasm of unspecified behavior of other genitourinary organ: Secondary | ICD-10-CM

## 2020-01-27 MED ORDER — INSULIN ASPART 100 UNIT/ML SC FLEXPEN
3 refills | 42.00000 days | Status: AC
Start: 2020-01-27 — End: ?

## 2020-01-27 MED ORDER — INSULIN GLARGINE 100 UNIT/ML (3 ML) SC INJ PEN
30 [IU] | Freq: Every day | SUBCUTANEOUS | 1 refills | 60.00000 days | Status: DC
Start: 2020-01-27 — End: 2020-01-27

## 2020-01-27 MED ORDER — BAQSIMI 3 MG/ACTUATION NA SPRY
NASAL | 0 refills | 16.00000 days | Status: AC
Start: 2020-01-27 — End: ?

## 2020-01-27 MED ORDER — PEN NEEDLE, DIABETIC 32 GAUGE X 5/32" MISC NDLE
1 | Freq: Before meals | 2 refills | 30.00000 days | Status: AC
Start: 2020-01-27 — End: ?

## 2020-01-27 MED ORDER — BASAGLAR KWIKPEN U-100 INSULIN 100 UNIT/ML (3 ML) SC INPN
30 [IU] | Freq: Every day | SUBCUTANEOUS | 1 refills | 60.00000 days | Status: AC
Start: 2020-01-27 — End: ?

## 2020-01-27 NOTE — Progress Notes
Obtained patient's verbal consent to treat them and their agreement to [facility] financial policy and NPP via this telehealth visit during the [facility]    Subjective:       Diabetes      Tina Snyder is a 54 y.o. female who was seen today as a follow up for type 2 DM. She was admitted in 11/2019 due to SBO and was also found to have pancreatitis. She was previously on U500 insulin, but following her admission with lower insulin requirements and poor PO intake, she was switched to Lantus and Novolog.     Interval changes:   She continues to be on prednisone 20 mg daily until she meets with gastroenterology. Since we last talked, her intake has improved a little bit, and she has noted more prominent hyperglycemia in the evenings. She has been snacking intermittently on things such as peanut butter crackers-- not currently taking novolog for snacks (only meals).   ?  Summary of Glucose Control:??  Lab Results   Component Value Date    HGBA1C 6.8 (H) 10/14/2019    GLUPOC 231 (H) 12/16/2019     Dexcom download:    -----------------------------  Tina Snyder  Date of Birth: Jan 26, 1966  Generated at: Thu, Jan 27, 2020 10:35 AM CDT  Reporting period: Tue Dec 28, 2019 - Wed Jan 26, 2020  -----------------------------  Glucose Details  Average glucose: 232 mg/dL  Standard deviation: 70 mg/dL  GMI: 1.6%  -----------------------------  Time in Range  Very High: 37%  High: 37%  In Range: 26%  Low: 0%  Very Low: 0%    Target Range  70-180 mg/dL    -----------------------------  CGM Details  Sensor usage: 97%  Days with CGM data: 29/30    Trends:   - Hyperglycemia in the afternoons and evenings  - Stable BG overnight with fasting readings near goal   ?  Type 2 DM:   Dx: 2012  Current regimen:  Lantus 30 units daily, Novolog 22 units with meals   Frequency of hypoglycemia none   ? How many meals per day??3 meals with snack ?  Weight changes:   Exercise? no   Lipid: lipitor 40 mg daily      Family History of diabetes: none, son with celiac  Social History: lives with her husband, [place], in [place]. Does not drink or smoke.    ?  Medical History:   Diagnosis Date   ? Bleeding tendency (HCC)    ? 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 Bowel disease    ? Crohn disease (HCC)    ? Dizziness    ? DM (diabetes mellitus), secondary-steroid-induced 2012   ? Fibromyalgia    ? GERD (gastroesophageal reflux disease)    ? Hypertension    ? Hypertensive heart disease    ? Migraines    ? Seasonal allergic reaction    ? Seizure disorder (HCC)    ? Steatohepatitis     felt secondary to medications   ? Type 2 diabetes mellitus without complication, with long-term current use of insulin (HCC) 10/22/2017   ? Ulcer    ? Vaginal tumor          Review of Systems  Positive for nausea      Objective:         ? atorvastatin (LIPITOR) 40 mg tablet Take one tablet by mouth daily.   ? bisacodyL (DULCOLAX) 10 mg rectal suppository  Insert or Apply one suppository to rectal area as directed daily as needed. Use if you have not had a bowel movement in 3 days   ? Blood-Glucose Sensor (DEXCOM G6 SENSOR) devi Use 1 each as directed as directed. Change every 10 days   ? budesonide (ENTOCORT EC) 3 mg capsule TAKE 2 CAPSULES BY MOUTH DAILY.   ? buPROPion XL (WELLBUTRIN XL) 300 mg tablet Take one tablet by mouth every morning. Do not crush or chew.   ? dapsone 100 mg tablet Take one tablet by mouth daily. Take this to prevent infection while your prednisone is 20mg  or higher, may discontinue once your prednisone dose if lower than 20mg    ? DEXCOM G6 RECEIVER misc Use 1 each as directed daily.   ? DEXCOM G6 TRANSMITTER devi Use 1 each as directed as directed. Every 90 days   ? duloxetine DR (CYMBALTA) 60 mg capsule Take one capsule by mouth daily with breakfast.   ? icosapent ethyL (VASCEPA) 1 gram capsule Take two capsules by mouth twice daily with meals.   ? insulin aspart (U-100) (NOVOLOG FLEXPEN U-100 INSULIN) 100 unit/mL (3 mL) injection PEN Inject twenty Units under the skin three times daily before meals.   ? insulin glargine (LANTUS SOLOSTAR U-100 INSULIN) 100 unit/mL (3 mL) injection PEN Inject thirty Units under the skin daily.   ? insulin pen needles (disposable) (B-D NANO) 32 gauge x 5/32 pen needle Use one each as directed three times daily before meals. Use with insulin injections.   ? nebivolol (BYSTOLIC) 10 mg tablet Take one tablet by mouth daily.   ? ondansetron HCL (ZOFRAN) 4 mg tablet Take one tablet by mouth every 8 hours.   ? pantoprazole DR (PROTONIX) 40 mg tablet Take one tablet by mouth daily.   ? polyethylene glycol 3350 (MIRALAX) 17 gram/dose powder Take seventeen g by mouth twice daily.   ? predniSONE (DELTASONE) 10 mg tablet Take two tablets by mouth daily with breakfast. Indications: Crohn's disease   ? traMADoL (ULTRAM) 50 mg tablet Take one tablet by mouth every 6 hours as needed for Pain.   ? vedolizumab (ENTYVIO IV) Administer  through vein every 8 weeks.     Telehealth Patient Reported Vitals     Row Name 01/27/20 0945                Pain Score  SEVEN        Pain Loc  ? small bowel blockage and Pancreatitis              Physical Exam  Vitals and nursing note reviewed.   Constitutional:       General: She is not in acute distress.     Appearance: Normal appearance. She is not ill-appearing.   HENT:      Head: Normocephalic and atraumatic.      Nose: Nose normal.   Eyes:      Conjunctiva/sclera: Conjunctivae normal.   Neurological:      Mental Status: She is alert and oriented to person, place, and time.   Psychiatric:         Mood and Affect: Mood normal.         Behavior: Behavior normal.         Thought Content: Thought content normal.         Judgment: Judgment normal.       Labs:   Comprehensive Metabolic Profile    Lab Results   Component Value Date/Time  NA 141 01/13/2020 10:32 AM    K 3.9 01/13/2020 10:32 AM    CL 105 01/13/2020 10:32 AM    CO2 24 01/13/2020 10:32 AM    GAP 12 01/13/2020 10:32 AM    BUN 20 01/13/2020 10:32 AM    CR 1.33 (H) 01/13/2020 10:32 AM    GLU 186 (H) 01/13/2020 10:32 AM    Lab Results   Component Value Date/Time    CA 9.3 01/13/2020 10:32 AM    PO4 3.8 12/16/2019 06:01 AM    ALBUMIN 4.2 01/13/2020 10:32 AM    TOTPROT 5.9 (L) 01/13/2020 10:32 AM    ALKPHOS 117 (H) 01/13/2020 10:32 AM    AST 17 01/13/2020 10:32 AM    ALT 36 01/13/2020 10:32 AM    TOTBILI 0.9 01/13/2020 10:32 AM    GFR 42 (L) 01/13/2020 10:32 AM    GFRAA 51 (L) 01/13/2020 10:32 AM        No results found for: MCALB24   Microalbumin/CR ratio Urine   Date Value Ref Range Status   03/27/2018 28.40 <30 ug/mg Final     Comment:     NOTE NEW REFERENCE RANGES     Microalbumin, Random   Date Value Ref Range Status   03/27/2018 7.1 <19 MCG/ML Final     Lab Results   Component Value Date    CHOL 155 12/12/2019    TRIG 235 (H) 12/12/2019    HDL 21 (L) 12/12/2019    LDL 101 (H) 12/12/2019    VLDL 47 12/12/2019    NONHDLCHOL 134 12/12/2019      TSH   Date Value Ref Range Status   10/14/2019 3.02 0.35 - 5.00 MCU/ML Final       Hemoglobin A1C   Date Value Ref Range Status   10/14/2019 6.8 (H) 4.0 - 6.0 % Final     Comment:     The ADA recommends that most patients with type 1 and type 2 diabetes maintain   an A1c level <7%.     11/16/2018 9.1 (H) 4.0 - 6.0 % Final     Comment:     The ADA recommends that most patients with type 1 and type 2 diabetes maintain   an A1c level <7%.     10/24/2015 9.3 (H) 4.0 - 6.0 % Final     Comment:     The ADA recommends that most patients with type 1 and type 2 diabetes maintain   an A1c level <7%.       Poc Hemoglobin A1C   Date Value Ref Range Status   03/26/2019 7.5 (H) 4 - 6 % Final   03/27/2018 9.2 % Final   11/03/2017 7.6 % Final         Lab Results   Component Value Date    PLTCT 208 01/13/2020               Assessment and Plan:  Diabetes mellitus type 2,  uncontrolled  ? Target A1c <7% without significant hypoglycemia  ? Complicated by: Nephropathy, poorly healing wounds, pancreatitis   ? On budesonide and prednisone 20 mg   ?  Plan:  ? Increase lunch and dinner novolog to 25 units, continue 20 U with breakfast   ? Take Novolog 8 units with snacks.   ? Avoid correcting more than once in a 3 hour period.   ? Continue lantus 30 U daily   ? Discussed patient's BMI with her.  The body mass  index is 37.42 kg/m?Marland Kitchen and falls within the category of Obesity 2 (35 to <40); BMI plan is in progress and plans to meet with dietitian .    ? We have been avoiding SGLT2/GLP-1 in this patient due to recurrent yeast infections, Crohn's. Will continue to avoid GLP1 due to hx of pancreatitis.   ? Discussed rule of 15 and how to tx hypoglycemia  ? Reminded pt to rotate insulin sites    Diabetic complications - prevention and management:  - Annual labs (electrolytes and renal function): Last done 07/2019  - Annual eye exam: Last done 04/2018.  Retinopathy - none  - Hyperlipidemia: hypertriglyceridemia. Lipid panel: Last done 11/2019.  Elevated TG. She relays that statin was not restarted since hospitalization-- restart now. Submit vascepa due to hypertriglyceridemia in the setting of Type 2 DM with > 2 cardiovascular risk factors.          - Statin: yes         - Discussed dietary modifications (low cholesterol)  - Nephropathy: yes, CKD 3. Annual Urine microalbumin/Cr: 03/2018-- WNL  - Foot exam / monofilament exam (recommended annually): Last done today         - Discussed foot care. No issues at this time  - Diabetic Educator and/or nutritionist visit (recommended annually): scheduled with Natasha Mead RD CDCES     RTC in 2 weeks with Pattie, 2 months with Dr. Lajoyce Corners, 4 months with me.     Patient Instructions       My nurse can be reached at [phone].     Please let us know if you have any questions.     Great to see you today!    - [name], PA-C         Martie Round, PA-C                             40 minutes spent on this patient's encounter with counseling and coordination of care taking >50% of the visit.    High complexity

## 2020-01-27 NOTE — Telephone Encounter
Lantus not covered by insurance anymore, pharmacy suggested [facility].  Sent new RX per protocol for [facility].  Patient notified.

## 2020-01-28 DIAGNOSIS — Z794 Long term (current) use of insulin: Secondary | ICD-10-CM

## 2020-01-28 DIAGNOSIS — E1165 Type 2 diabetes mellitus with hyperglycemia: Secondary | ICD-10-CM

## 2020-02-02 ENCOUNTER — Encounter: Admit: 2020-02-02 | Discharge: 2020-02-02 | Payer: BC Managed Care – PPO

## 2020-02-03 ENCOUNTER — Encounter: Admit: 2020-02-03 | Discharge: 2020-02-03 | Payer: BC Managed Care – PPO

## 2020-02-03 ENCOUNTER — Ambulatory Visit: Admit: 2020-02-03 | Discharge: 2020-02-04 | Payer: BC Managed Care – PPO

## 2020-02-03 NOTE — Patient Instructions
Tina Snyder,    It was very nice to meet you today.    Lantus 30 units daily,   Novolog 22 units for food  with breakfast,                 25 units for food  with Lunch and Dinner                8 units for snacks     If your before meal blood sugar higher that 150 add more Novolog as slcae below:  (2 units of insulin will bring your blood sugar down 50 mg/dL)    If  your pre meal Blood sugar is: Give extra # of units of Novolog   Up to 150 0   151 to 200 2   201 to 250 4   251 to 300 6   301 to 350 8   351 to 400 10             More than 400 12       - For example if your blood sugarr before breakfast is 375 and you are going to eat your breakfast: add 10 units (for high sugar)+ 22 units (for food)= 32 units before breakfast.    - If you skip meal use correction factor in the table to correct high blood sugar: if your blood sugar is 375 and you are not eating take 10 units Humalog for high blood sugar.    -If you forget to check blood sugar before meal and you are already eating some food and plan to finish the meal take Novolog as soon as possible, but no longer than 1 hour after meal.    - If you want to correct high blood sugar use scale in the table and make sure that at least 3 hours  from last Novolog shot.      If you have any question please don't hesitate to contact me via your MyChart , or call [phone].  Best,    Chriss Driver, MS, RDN, LD, CDCES  Certified Diabetes Care and Education Specialist  [facility]  [facility]  [email or web address]    For appointments: [phone]                                                    Endo on call: [phone] after hours

## 2020-02-03 NOTE — Telephone Encounter
FYI

## 2020-02-04 DIAGNOSIS — Z794 Long term (current) use of insulin: Secondary | ICD-10-CM

## 2020-02-04 DIAGNOSIS — E1165 Type 2 diabetes mellitus with hyperglycemia: Principal | ICD-10-CM

## 2020-02-14 ENCOUNTER — Encounter: Admit: 2020-02-14 | Discharge: 2020-02-14 | Payer: BC Managed Care – PPO

## 2020-02-15 ENCOUNTER — Encounter: Admit: 2020-02-15 | Discharge: 2020-02-15 | Payer: BC Managed Care – PPO

## 2020-02-15 ENCOUNTER — Ambulatory Visit: Admit: 2020-02-15 | Discharge: 2020-02-16 | Payer: BC Managed Care – PPO

## 2020-02-15 ENCOUNTER — Ambulatory Visit: Admit: 2020-02-15 | Discharge: 2020-02-15 | Payer: BC Managed Care – PPO

## 2020-02-15 DIAGNOSIS — R42 Dizziness and giddiness: Secondary | ICD-10-CM

## 2020-02-15 DIAGNOSIS — K5090 Crohn's disease, unspecified, without complications: Secondary | ICD-10-CM

## 2020-02-15 DIAGNOSIS — J302 Other seasonal allergic rhinitis: Secondary | ICD-10-CM

## 2020-02-15 DIAGNOSIS — K7581 Nonalcoholic steatohepatitis (NASH): Secondary | ICD-10-CM

## 2020-02-15 DIAGNOSIS — K219 Gastro-esophageal reflux disease without esophagitis: Secondary | ICD-10-CM

## 2020-02-15 DIAGNOSIS — I8290 Acute embolism and thrombosis of unspecified vein: Secondary | ICD-10-CM

## 2020-02-15 DIAGNOSIS — IMO0002 Ulcer: Secondary | ICD-10-CM

## 2020-02-15 DIAGNOSIS — G43909 Migraine, unspecified, not intractable, without status migrainosus: Secondary | ICD-10-CM

## 2020-02-15 DIAGNOSIS — K639 Disease of intestine, unspecified: Secondary | ICD-10-CM

## 2020-02-15 DIAGNOSIS — K859 Acute pancreatitis without necrosis or infection, unspecified: Secondary | ICD-10-CM

## 2020-02-15 DIAGNOSIS — I10 Essential (primary) hypertension: Secondary | ICD-10-CM

## 2020-02-15 DIAGNOSIS — E139 Other specified diabetes mellitus without complications: Secondary | ICD-10-CM

## 2020-02-15 DIAGNOSIS — D4959 Neoplasm of unspecified behavior of other genitourinary organ: Secondary | ICD-10-CM

## 2020-02-15 DIAGNOSIS — D699 Hemorrhagic condition, unspecified: Secondary | ICD-10-CM

## 2020-02-15 DIAGNOSIS — Z20822 Encounter for screening laboratory testing for COVID-19 virus in asymptomatic patient: Secondary | ICD-10-CM

## 2020-02-15 DIAGNOSIS — G40909 Epilepsy, unspecified, not intractable, without status epilepticus: Secondary | ICD-10-CM

## 2020-02-15 DIAGNOSIS — M797 Fibromyalgia: Secondary | ICD-10-CM

## 2020-02-15 DIAGNOSIS — E119 Type 2 diabetes mellitus without complications: Secondary | ICD-10-CM

## 2020-02-15 DIAGNOSIS — K5080 Crohn's disease of both small and large intestine without complications: Secondary | ICD-10-CM

## 2020-02-15 DIAGNOSIS — K50018 Crohn's disease of small intestine with other complication: Secondary | ICD-10-CM

## 2020-02-15 MED ORDER — TRAMADOL 50 MG PO TAB
1 | ORAL_TABLET | ORAL | 0 refills | Status: AC | PRN
Start: 2020-02-15 — End: ?

## 2020-02-15 NOTE — Patient Instructions
Taper your prednisone 5mg  every two weeks:  15mg  x 2 weeks  10mg  x 2 weeks  5mg  x 2 weeks  Then off    - schedule miralax once daily  - Continue entyvio

## 2020-02-18 ENCOUNTER — Encounter: Admit: 2020-02-18 | Discharge: 2020-02-18 | Payer: BC Managed Care – PPO

## 2020-02-18 MED ORDER — DOXYCYCLINE HYCLATE 100 MG PO TAB
100 mg | ORAL_TABLET | Freq: Two times a day (BID) | ORAL | 0 refills | 8.00000 days | Status: AC
Start: 2020-02-18 — End: ?

## 2020-02-18 NOTE — Patient Instructions
-   If you have any questions regarding your appointment or any concerns, please contact  , RN at [phone].    - If you need to schedule or reschedule an appointment, please call [phone].     -For refills on medications, please have your pharmacy fax a refill authorization request form to our office at [phone]. Please allow at least 3 business days for refill requests.    - For any urgent issues after business hours, please call [phone] and have the on call pulmonary physician paged.

## 2020-02-21 ENCOUNTER — Encounter: Admit: 2020-02-21 | Discharge: 2020-02-21 | Payer: BC Managed Care – PPO

## 2020-02-21 ENCOUNTER — Ambulatory Visit: Admit: 2020-02-21 | Discharge: 2020-02-22 | Payer: BC Managed Care – PPO

## 2020-02-21 DIAGNOSIS — J302 Other seasonal allergic rhinitis: Secondary | ICD-10-CM

## 2020-02-21 DIAGNOSIS — I10 Essential (primary) hypertension: Secondary | ICD-10-CM

## 2020-02-21 DIAGNOSIS — E139 Other specified diabetes mellitus without complications: Secondary | ICD-10-CM

## 2020-02-21 DIAGNOSIS — E785 Hyperlipidemia, unspecified: Secondary | ICD-10-CM

## 2020-02-21 DIAGNOSIS — E119 Type 2 diabetes mellitus without complications: Secondary | ICD-10-CM

## 2020-02-21 DIAGNOSIS — G40909 Epilepsy, unspecified, not intractable, without status epilepticus: Secondary | ICD-10-CM

## 2020-02-21 DIAGNOSIS — I82409 Acute embolism and thrombosis of unspecified deep veins of unspecified lower extremity: Secondary | ICD-10-CM

## 2020-02-21 DIAGNOSIS — Z8619 Personal history of other infectious and parasitic diseases: Secondary | ICD-10-CM

## 2020-02-21 DIAGNOSIS — N183 CKD (chronic kidney disease) stage 3, GFR 30-59 ml/min (HCC): Secondary | ICD-10-CM

## 2020-02-21 DIAGNOSIS — D699 Hemorrhagic condition, unspecified: Secondary | ICD-10-CM

## 2020-02-21 DIAGNOSIS — K639 Disease of intestine, unspecified: Secondary | ICD-10-CM

## 2020-02-21 DIAGNOSIS — K56609 Unspecified intestinal obstruction, unspecified as to partial versus complete obstruction: Secondary | ICD-10-CM

## 2020-02-21 DIAGNOSIS — Z789 Other specified health status: Secondary | ICD-10-CM

## 2020-02-21 DIAGNOSIS — K219 Gastro-esophageal reflux disease without esophagitis: Secondary | ICD-10-CM

## 2020-02-21 DIAGNOSIS — IMO0002 Ulcer: Secondary | ICD-10-CM

## 2020-02-21 DIAGNOSIS — G43909 Migraine, unspecified, not intractable, without status migrainosus: Secondary | ICD-10-CM

## 2020-02-21 DIAGNOSIS — K7581 Nonalcoholic steatohepatitis (NASH): Secondary | ICD-10-CM

## 2020-02-21 DIAGNOSIS — I8290 Acute embolism and thrombosis of unspecified vein: Secondary | ICD-10-CM

## 2020-02-21 DIAGNOSIS — K5090 Crohn's disease, unspecified, without complications: Secondary | ICD-10-CM

## 2020-02-21 DIAGNOSIS — D4959 Neoplasm of unspecified behavior of other genitourinary organ: Secondary | ICD-10-CM

## 2020-02-21 DIAGNOSIS — K859 Acute pancreatitis without necrosis or infection, unspecified: Secondary | ICD-10-CM

## 2020-02-21 DIAGNOSIS — R42 Dizziness and giddiness: Secondary | ICD-10-CM

## 2020-02-21 DIAGNOSIS — M797 Fibromyalgia: Secondary | ICD-10-CM

## 2020-02-21 DIAGNOSIS — G4733 Obstructive sleep apnea (adult) (pediatric): Secondary | ICD-10-CM

## 2020-02-22 ENCOUNTER — Encounter: Admit: 2020-02-22 | Discharge: 2020-02-22 | Payer: BC Managed Care – PPO

## 2020-02-24 ENCOUNTER — Encounter: Admit: 2020-02-24 | Discharge: 2020-02-24 | Payer: BC Managed Care – PPO

## 2020-02-24 NOTE — Telephone Encounter
Order for supplies placed as discussed per office note on 02/21/20    DME: [facility]  Will continue to follow up on order.

## 2020-02-25 ENCOUNTER — Encounter: Admit: 2020-02-25 | Discharge: 2020-02-25 | Payer: BC Managed Care – PPO

## 2020-03-01 ENCOUNTER — Encounter: Admit: 2020-03-01 | Discharge: 2020-03-01 | Payer: BC Managed Care – PPO

## 2020-03-01 DIAGNOSIS — IMO0002 Ulcer: Secondary | ICD-10-CM

## 2020-03-01 DIAGNOSIS — D699 Hemorrhagic condition, unspecified: Secondary | ICD-10-CM

## 2020-03-01 DIAGNOSIS — J302 Other seasonal allergic rhinitis: Secondary | ICD-10-CM

## 2020-03-01 DIAGNOSIS — I10 Essential (primary) hypertension: Secondary | ICD-10-CM

## 2020-03-01 DIAGNOSIS — K219 Gastro-esophageal reflux disease without esophagitis: Secondary | ICD-10-CM

## 2020-03-01 DIAGNOSIS — D4959 Neoplasm of unspecified behavior of other genitourinary organ: Secondary | ICD-10-CM

## 2020-03-01 DIAGNOSIS — G40909 Epilepsy, unspecified, not intractable, without status epilepticus: Secondary | ICD-10-CM

## 2020-03-01 DIAGNOSIS — Z8619 Personal history of other infectious and parasitic diseases: Secondary | ICD-10-CM

## 2020-03-01 DIAGNOSIS — K639 Disease of intestine, unspecified: Secondary | ICD-10-CM

## 2020-03-01 DIAGNOSIS — K859 Acute pancreatitis without necrosis or infection, unspecified: Secondary | ICD-10-CM

## 2020-03-01 DIAGNOSIS — E785 Hyperlipidemia, unspecified: Secondary | ICD-10-CM

## 2020-03-01 DIAGNOSIS — E119 Type 2 diabetes mellitus without complications: Secondary | ICD-10-CM

## 2020-03-01 DIAGNOSIS — Z789 Other specified health status: Secondary | ICD-10-CM

## 2020-03-01 DIAGNOSIS — E139 Other specified diabetes mellitus without complications: Secondary | ICD-10-CM

## 2020-03-01 DIAGNOSIS — I82409 Acute embolism and thrombosis of unspecified deep veins of unspecified lower extremity: Secondary | ICD-10-CM

## 2020-03-01 DIAGNOSIS — K56609 Unspecified intestinal obstruction, unspecified as to partial versus complete obstruction: Secondary | ICD-10-CM

## 2020-03-01 DIAGNOSIS — G43909 Migraine, unspecified, not intractable, without status migrainosus: Secondary | ICD-10-CM

## 2020-03-01 DIAGNOSIS — K5090 Crohn's disease, unspecified, without complications: Secondary | ICD-10-CM

## 2020-03-01 DIAGNOSIS — I8290 Acute embolism and thrombosis of unspecified vein: Secondary | ICD-10-CM

## 2020-03-01 DIAGNOSIS — K7581 Nonalcoholic steatohepatitis (NASH): Secondary | ICD-10-CM

## 2020-03-01 DIAGNOSIS — R42 Dizziness and giddiness: Secondary | ICD-10-CM

## 2020-03-01 DIAGNOSIS — M797 Fibromyalgia: Secondary | ICD-10-CM

## 2020-03-01 DIAGNOSIS — N183 CKD (chronic kidney disease) stage 3, GFR 30-59 ml/min (HCC): Secondary | ICD-10-CM

## 2020-03-01 MED ORDER — PREDNISONE 10 MG PO TAB
ORAL_TABLET | Freq: Every day | 1 refills | Status: AC
Start: 2020-03-01 — End: ?

## 2020-03-01 MED ADMIN — LIDOCAINE (PF) 20 MG/ML (2 %) IJ SOLN [95839]: 100 mg | INTRAVENOUS | @ 20:00:00 | Stop: 2020-03-01 | NDC 55150016505

## 2020-03-01 MED ADMIN — PROPOFOL INJ 10 MG/ML IV VIAL [210331]: 125 ug/kg/min | INTRAVENOUS | @ 20:00:00 | Stop: 2020-03-01 | NDC 25021060820

## 2020-03-01 MED ADMIN — FENTANYL CITRATE (PF) 50 MCG/ML IJ SOLN [3037]: 25 ug | INTRAVENOUS | @ 20:00:00 | Stop: 2020-03-01 | NDC 00409909425

## 2020-03-01 MED ADMIN — PROMETHAZINE 25 MG/ML IJ SOLN [6618]: 12.5 mg | INTRAVENOUS | @ 18:00:00 | Stop: 2020-03-01 | NDC 00641092821

## 2020-03-01 MED ADMIN — LACTATED RINGERS IV SOLP [4318]: 1000.000 mL | INTRAVENOUS | @ 18:00:00 | Stop: 2020-03-01 | NDC 00338011704

## 2020-03-02 ENCOUNTER — Encounter: Admit: 2020-03-02 | Discharge: 2020-03-02 | Payer: BC Managed Care – PPO

## 2020-03-02 DIAGNOSIS — G43909 Migraine, unspecified, not intractable, without status migrainosus: Secondary | ICD-10-CM

## 2020-03-02 DIAGNOSIS — J302 Other seasonal allergic rhinitis: Secondary | ICD-10-CM

## 2020-03-02 DIAGNOSIS — K639 Disease of intestine, unspecified: Secondary | ICD-10-CM

## 2020-03-02 DIAGNOSIS — Z8619 Personal history of other infectious and parasitic diseases: Secondary | ICD-10-CM

## 2020-03-02 DIAGNOSIS — I82409 Acute embolism and thrombosis of unspecified deep veins of unspecified lower extremity: Secondary | ICD-10-CM

## 2020-03-02 DIAGNOSIS — K56609 Unspecified intestinal obstruction, unspecified as to partial versus complete obstruction: Secondary | ICD-10-CM

## 2020-03-02 DIAGNOSIS — E785 Hyperlipidemia, unspecified: Secondary | ICD-10-CM

## 2020-03-02 DIAGNOSIS — I10 Essential (primary) hypertension: Secondary | ICD-10-CM

## 2020-03-02 DIAGNOSIS — IMO0002 Ulcer: Secondary | ICD-10-CM

## 2020-03-02 DIAGNOSIS — E139 Other specified diabetes mellitus without complications: Secondary | ICD-10-CM

## 2020-03-02 DIAGNOSIS — K5090 Crohn's disease, unspecified, without complications: Secondary | ICD-10-CM

## 2020-03-02 DIAGNOSIS — K7581 Nonalcoholic steatohepatitis (NASH): Secondary | ICD-10-CM

## 2020-03-02 DIAGNOSIS — K859 Acute pancreatitis without necrosis or infection, unspecified: Secondary | ICD-10-CM

## 2020-03-02 DIAGNOSIS — K219 Gastro-esophageal reflux disease without esophagitis: Secondary | ICD-10-CM

## 2020-03-02 DIAGNOSIS — G40909 Epilepsy, unspecified, not intractable, without status epilepticus: Secondary | ICD-10-CM

## 2020-03-02 DIAGNOSIS — N183 CKD (chronic kidney disease) stage 3, GFR 30-59 ml/min (HCC): Secondary | ICD-10-CM

## 2020-03-02 DIAGNOSIS — Z789 Other specified health status: Secondary | ICD-10-CM

## 2020-03-02 DIAGNOSIS — D699 Hemorrhagic condition, unspecified: Secondary | ICD-10-CM

## 2020-03-02 DIAGNOSIS — I8290 Acute embolism and thrombosis of unspecified vein: Secondary | ICD-10-CM

## 2020-03-02 DIAGNOSIS — D4959 Neoplasm of unspecified behavior of other genitourinary organ: Secondary | ICD-10-CM

## 2020-03-02 DIAGNOSIS — M797 Fibromyalgia: Secondary | ICD-10-CM

## 2020-03-02 DIAGNOSIS — E119 Type 2 diabetes mellitus without complications: Secondary | ICD-10-CM

## 2020-03-02 DIAGNOSIS — R42 Dizziness and giddiness: Secondary | ICD-10-CM

## 2020-03-03 ENCOUNTER — Encounter: Admit: 2020-03-03 | Discharge: 2020-03-03 | Payer: BC Managed Care – PPO

## 2020-03-05 MED ORDER — PROMETHAZINE 25 MG PO TAB
25 mg | ORAL_TABLET | ORAL | 3 refills | 7.00000 days | Status: AC | PRN
Start: 2020-03-05 — End: ?

## 2020-03-08 ENCOUNTER — Encounter: Admit: 2020-03-08 | Discharge: 2020-03-08 | Payer: BC Managed Care – PPO

## 2020-03-09 ENCOUNTER — Encounter: Admit: 2020-03-09 | Discharge: 2020-03-09 | Payer: BC Managed Care – PPO

## 2020-03-09 ENCOUNTER — Ambulatory Visit: Admit: 2020-03-09 | Discharge: 2020-03-10 | Payer: BC Managed Care – PPO

## 2020-03-09 DIAGNOSIS — G40909 Epilepsy, unspecified, not intractable, without status epilepticus: Secondary | ICD-10-CM

## 2020-03-09 DIAGNOSIS — K859 Acute pancreatitis without necrosis or infection, unspecified: Secondary | ICD-10-CM

## 2020-03-09 DIAGNOSIS — E119 Type 2 diabetes mellitus without complications: Secondary | ICD-10-CM

## 2020-03-09 DIAGNOSIS — K50918 Crohn's disease, unspecified, with other complication: Principal | ICD-10-CM

## 2020-03-09 DIAGNOSIS — D699 Hemorrhagic condition, unspecified: Secondary | ICD-10-CM

## 2020-03-09 DIAGNOSIS — G43909 Migraine, unspecified, not intractable, without status migrainosus: Secondary | ICD-10-CM

## 2020-03-09 DIAGNOSIS — K5090 Crohn's disease, unspecified, without complications: Secondary | ICD-10-CM

## 2020-03-09 DIAGNOSIS — K639 Disease of intestine, unspecified: Secondary | ICD-10-CM

## 2020-03-09 DIAGNOSIS — K5080 Crohn's disease of both small and large intestine without complications: Secondary | ICD-10-CM

## 2020-03-09 DIAGNOSIS — IMO0002 Ulcer: Secondary | ICD-10-CM

## 2020-03-09 DIAGNOSIS — K56609 Unspecified intestinal obstruction, unspecified as to partial versus complete obstruction: Secondary | ICD-10-CM

## 2020-03-09 DIAGNOSIS — Z8619 Personal history of other infectious and parasitic diseases: Secondary | ICD-10-CM

## 2020-03-09 DIAGNOSIS — K7581 Nonalcoholic steatohepatitis (NASH): Secondary | ICD-10-CM

## 2020-03-09 DIAGNOSIS — I8290 Acute embolism and thrombosis of unspecified vein: Secondary | ICD-10-CM

## 2020-03-09 DIAGNOSIS — K219 Gastro-esophageal reflux disease without esophagitis: Secondary | ICD-10-CM

## 2020-03-09 DIAGNOSIS — M797 Fibromyalgia: Secondary | ICD-10-CM

## 2020-03-09 DIAGNOSIS — E139 Other specified diabetes mellitus without complications: Secondary | ICD-10-CM

## 2020-03-09 DIAGNOSIS — R42 Dizziness and giddiness: Secondary | ICD-10-CM

## 2020-03-09 DIAGNOSIS — D4959 Neoplasm of unspecified behavior of other genitourinary organ: Secondary | ICD-10-CM

## 2020-03-09 DIAGNOSIS — I10 Essential (primary) hypertension: Secondary | ICD-10-CM

## 2020-03-09 DIAGNOSIS — J302 Other seasonal allergic rhinitis: Secondary | ICD-10-CM

## 2020-03-09 DIAGNOSIS — E785 Hyperlipidemia, unspecified: Secondary | ICD-10-CM

## 2020-03-09 DIAGNOSIS — I82409 Acute embolism and thrombosis of unspecified deep veins of unspecified lower extremity: Secondary | ICD-10-CM

## 2020-03-09 DIAGNOSIS — Z789 Other specified health status: Secondary | ICD-10-CM

## 2020-03-09 DIAGNOSIS — N183 CKD (chronic kidney disease) stage 3, GFR 30-59 ml/min (HCC): Secondary | ICD-10-CM

## 2020-03-09 MED ORDER — TRAMADOL 50 MG PO TAB
1 | ORAL_TABLET | ORAL | 0 refills | Status: AC | PRN
Start: 2020-03-09 — End: ?

## 2020-03-09 MED ORDER — ACETAMINOPHEN 500 MG PO TAB
500 mg | Freq: Once | ORAL | 0 refills | Status: CP
Start: 2020-03-09 — End: ?
  Administered 2020-03-09: 17:00:00 500 mg via ORAL

## 2020-03-09 MED ORDER — DIPHENHYDRAMINE HCL 50 MG/ML IJ SOLN
25-50 mg | Freq: Once | INTRAVENOUS | 0 refills
Start: 2020-03-09 — End: ?

## 2020-03-09 MED ORDER — DIPHENHYDRAMINE HCL 50 MG/ML IJ SOLN
25-50 mg | Freq: Once | INTRAVENOUS | 0 refills | Status: CP
Start: 2020-03-09 — End: ?
  Administered 2020-03-09 (×2): 25 mg via INTRAVENOUS

## 2020-03-09 MED ORDER — ACETAMINOPHEN 500 MG PO TAB
500 mg | Freq: Once | ORAL | 0 refills
Start: 2020-03-09 — End: ?

## 2020-03-09 MED ORDER — VEDOLIZUMAB IVPB
300 mg | Freq: Once | INTRAVENOUS | 0 refills | Status: CP
Start: 2020-03-09 — End: ?
  Administered 2020-03-09 (×2): 300 mg via INTRAVENOUS

## 2020-03-09 MED ORDER — VEDOLIZUMAB IVPB
300 mg | Freq: Once | INTRAVENOUS | 0 refills
Start: 2020-03-09 — End: ?

## 2020-03-09 NOTE — Progress Notes
Patient tolerated infusion; no reaction noted.

## 2020-03-13 ENCOUNTER — Ambulatory Visit: Admit: 2020-03-13 | Discharge: 2020-03-13 | Payer: BC Managed Care – PPO

## 2020-03-13 ENCOUNTER — Encounter: Admit: 2020-03-13 | Discharge: 2020-03-13 | Payer: BC Managed Care – PPO

## 2020-03-13 DIAGNOSIS — K219 Gastro-esophageal reflux disease without esophagitis: Secondary | ICD-10-CM

## 2020-03-13 DIAGNOSIS — I7789 Other specified disorders of arteries and arterioles: Secondary | ICD-10-CM

## 2020-03-13 DIAGNOSIS — I10 Essential (primary) hypertension: Secondary | ICD-10-CM

## 2020-03-13 DIAGNOSIS — E669 Obesity, unspecified: Secondary | ICD-10-CM

## 2020-03-13 DIAGNOSIS — K7581 Nonalcoholic steatohepatitis (NASH): Secondary | ICD-10-CM

## 2020-03-13 DIAGNOSIS — J479 Bronchiectasis, uncomplicated: Secondary | ICD-10-CM

## 2020-03-13 DIAGNOSIS — IMO0002 Ulcer: Secondary | ICD-10-CM

## 2020-03-13 DIAGNOSIS — I82409 Acute embolism and thrombosis of unspecified deep veins of unspecified lower extremity: Secondary | ICD-10-CM

## 2020-03-13 DIAGNOSIS — K56609 Unspecified intestinal obstruction, unspecified as to partial versus complete obstruction: Secondary | ICD-10-CM

## 2020-03-13 DIAGNOSIS — E785 Hyperlipidemia, unspecified: Secondary | ICD-10-CM

## 2020-03-13 DIAGNOSIS — Z789 Other specified health status: Secondary | ICD-10-CM

## 2020-03-13 DIAGNOSIS — M797 Fibromyalgia: Secondary | ICD-10-CM

## 2020-03-13 DIAGNOSIS — G40909 Epilepsy, unspecified, not intractable, without status epilepticus: Secondary | ICD-10-CM

## 2020-03-13 DIAGNOSIS — K5090 Crohn's disease, unspecified, without complications: Secondary | ICD-10-CM

## 2020-03-13 DIAGNOSIS — R0602 Shortness of breath: Secondary | ICD-10-CM

## 2020-03-13 DIAGNOSIS — N183 CKD (chronic kidney disease) stage 3, GFR 30-59 ml/min (HCC): Secondary | ICD-10-CM

## 2020-03-13 DIAGNOSIS — D699 Hemorrhagic condition, unspecified: Secondary | ICD-10-CM

## 2020-03-13 DIAGNOSIS — A310 Pulmonary mycobacterial infection: Secondary | ICD-10-CM

## 2020-03-13 DIAGNOSIS — Z8619 Personal history of other infectious and parasitic diseases: Secondary | ICD-10-CM

## 2020-03-13 DIAGNOSIS — E139 Other specified diabetes mellitus without complications: Secondary | ICD-10-CM

## 2020-03-13 DIAGNOSIS — K639 Disease of intestine, unspecified: Secondary | ICD-10-CM

## 2020-03-13 DIAGNOSIS — E119 Type 2 diabetes mellitus without complications: Secondary | ICD-10-CM

## 2020-03-13 DIAGNOSIS — R42 Dizziness and giddiness: Secondary | ICD-10-CM

## 2020-03-13 DIAGNOSIS — G4733 Obstructive sleep apnea (adult) (pediatric): Secondary | ICD-10-CM

## 2020-03-13 DIAGNOSIS — D4959 Neoplasm of unspecified behavior of other genitourinary organ: Secondary | ICD-10-CM

## 2020-03-13 DIAGNOSIS — G43909 Migraine, unspecified, not intractable, without status migrainosus: Secondary | ICD-10-CM

## 2020-03-13 DIAGNOSIS — I8290 Acute embolism and thrombosis of unspecified vein: Secondary | ICD-10-CM

## 2020-03-13 DIAGNOSIS — K859 Acute pancreatitis without necrosis or infection, unspecified: Secondary | ICD-10-CM

## 2020-03-13 DIAGNOSIS — J302 Other seasonal allergic rhinitis: Secondary | ICD-10-CM

## 2020-03-13 NOTE — Patient Instructions
Please schedule an appointment with Dr. Daine Gravel and Dr. Patsey Berthold.    We will work on getting you Home O2.      Please send a MyChart message or call the Cardiology [facility] Team with questions, [phone], Melanie RN, Caryl Pina RN, Wilford Sports, RN, Heather RN, [facility] LPN    You may receive test results in [place] before the ordering provider has reviewed them. Our care team will follow up with you after reviewing the tests to discuss your care. This may take up to 5-7 business days if results are not urgently needing to be addressed. Thank you for your patience.

## 2020-03-13 NOTE — Progress Notes
Date of Service: 03/13/2020    Tina Snyder is a 54 y.o. female.       HPI     Tina Snyder comes for followup. I saw her about 2-1/2 years ago.  She is a pleasant 55 year old woman.  She has had Crohn's disease for about 25 years.  She has been on immunosuppressive therapy.  She has had biologic treatment every 2 months.  She has had hypertension, migraines, obesity, fibromyalgia and NAI infection.  She has had previous lung surgeries.  She has had subclavian thrombus.  She has chest wall collaterals.  She was recently evaluated and found to have small-bowel obstruction, presumably from her multiple previous abdominal surgeries and acute pancreatitis.  She had recent EGD with ultrasound.  She may need additional bowel surgery.  Her oxygenation has been diminished.  I understand she needed 6 L of oxygen for her anesthesia on November 10.  She has not been checking her oxygen at home.  She does use CPAP with oxygen but has not been needing oxygen during the daytime.  She denies chest pain.  She does get some dyspnea with exertion.  There has been no edema.  She denies palpitations, presyncope, syncope, TIA, stroke, and claudication.  There has been no fever, chills, or sweats.  She has been tested for COVID and has not had that problem.  She needs to get a booster.  She had the JJ vaccination.    ([id number])             Vitals:    03/13/20 1314   BP: (!) 164/108   BP Source: Arm, Left Upper   Patient Position: Sitting   Weight: 105.1 kg (231 lb 9.6 oz)   Height: 1.626 m (5' 4)   PainSc: Zero     Body mass index is 39.75 kg/m?Marland Kitchen     Past Medical History  Patient Active Problem List    Diagnosis Date Noted   ? Morbid (severe) obesity due to excess calories (HCC) 03/13/2020   ? SBO (small bowel obstruction) (HCC) 12/08/2019   ? Eustachian tube dysfunction, bilateral 03/30/2019   ? Facial droop 03/02/2019   ? Lung nodule 12/07/2018   ? Obesity (BMI 30-39.9) 10/29/2018     BMI 39     ? Bronchiectasis without complication (HCC) 10/29/2018   ? Essential (hemorrhagic) thrombocythemia (HCC) 10/29/2018   ? Peripheral edema 10/29/2018   ? Generalized edema 11/27/2017   ? 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 Closed fracture of right foot/leg wearing brace; with delayed healing 18Dec.2018, at noon dizzy, fell back wards with leg back under her; fractured R foot/leg; following with orthopedics 11/09/2017     7.15.19 Right ankle not healing awaiting bone stimulator; if this does not work then will do surgery and pin it       ? 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   She had two ultrasounds in 2019 which demonstated no acute thrombosis.   She is starting lymphedema OT in 2019.   Referral to vascular surgery for whether she is a candidate for venous bypass or intervention of the proximal stenosis to help manage the lymphedema.      ? Type 2 diabetes mellitus with diabetic chronic kidney disease (HCC) 10/22/2017   ? OSA (obstructive sleep apnea) 10/20/2017  Sleep study done on 10/10/2017 which showed AHI of 133/h.  Time with saturation less than or equal to 88% for 70 minutes.  CPAP at a pressure of 14 cm was found to be effective.     DME- [facility]    Compliance report today showed 93% usage for more than 4 hours average daily use of 8 hours and 5 minutes out of CPAP 5-12 cm of water AHI residual 0.5 events per hour     ? Obesity (BMI 30-39.9) 10/20/2017     BMI 37.08    BMI 39     ? Pure hypercholesterolemia 10/17/2017   ? Lymphedema of both arms 10/15/2017     Bilateral upper extremities:  She had evidence of narrowing or blockage of her left brachiocephalic and right internal jugular vein on CT with contrast in 2016.   She had two ultrasounds in 2019 which demonstated no acute thrombosis.   She is starting lymphedema OT in 2019.   Referral to vascular surgery for whether she is a candidate for venous bypass or intervention of the proximal stenosis to help manage the lymphedema     ? Fracture of right lower leg-fibula 18Dec.2018 06/28/2017   ? Enteritis 03/27/2016   ? Parotitis 10/18/2015   ? CKD (chronic kidney disease) stage 3, GFR 30-59 ml/min (HCC) 10/18/2015   ? 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 DVT (deep venous thrombosis) (HCC) 10/31/2010   ? Anemia 10/30/2010   ? Seizure disorder (HCC) 10/30/2010   ? HTN (hypertension) 10/16/2010   ? Depression 10/16/2010   ? Crohn's disease (HCC) 09/13/2007         Review of Systems   All other systems reviewed and are negative.  Review of systems as documented in the database.    ([id number])        Physical Exam  On examination, she is in no distress.  She is 5 feet 4 inches.  Weight is 231.  BMI is 39.8.  Pulse is regular at 80 beats per minute.  Rhythm is sinus.  Saturation is 85%.  She has peripheral cyanosis.  Venous pressure is normal.  There is no edema.  Lungs are clear.  I do not hear any wheeze or rhonchi.  PMI is not felt.  Heart sounds are normal.  I do not hear any gallops, murmurs, or rubs.  There is no hepatomegaly.  There are no abdominal bruits.  Bowel sounds are normal.  There is no icterus.  There is no focal neurologic deficit.  Distal pulses are 2+ bilaterally.  There is no carotid bruit.  She is alert and oriented times 3.  Her mood, judgment, and affect are normal.    ([id number])      Cardiovascular Studies  EKG shows sinus rhythm ***.  There has been no acute change.    ([id number])        Cardiovascular Health Factors  Vitals BP Readings from Last 3 Encounters:   03/13/20 (!) 164/108   03/09/20 (!) 134/90   03/01/20 (!) 155/93     Wt Readings from Last 3 Encounters:   03/13/20 105.1 kg (231 lb 9.6 oz)   03/09/20 105.2 kg (232 lb)   03/01/20 101.2 kg (223 lb)     BMI Readings from Last 3 Encounters:   03/13/20 39.75 kg/m? 03/09/20 39.82 kg/m?   03/01/20 38.28 kg/m?      Smoking Social History     Tobacco Use  Smoking Status Never Smoker   Smokeless Tobacco Never Used      Lipid Profile Cholesterol   Date Value Ref Range Status   12/12/2019 155 <200 MG/DL Final     HDL   Date Value Ref Range Status   12/12/2019 21 (L) >40 MG/DL Final     LDL   Date Value Ref Range Status   12/12/2019 101 (H) <100 mg/dL Final     Triglycerides   Date Value Ref Range Status   12/12/2019 235 (H) <150 MG/DL Final      Blood Sugar Hemoglobin A1C   Date Value Ref Range Status   10/14/2019 6.8 (H) 4.0 - 6.0 % Final     Comment:     The ADA recommends that most patients with type 1 and type 2 diabetes maintain   an A1c level <7%.       Glucose   Date Value Ref Range Status   03/09/2020 123 (H) 70 - 100 MG/DL Final   45/40/9811 914 (H) 70 - 100 MG/DL Final   78/29/5621 308 (H) 70 - 100 MG/DL Final     Glucose, POC   Date Value Ref Range Status   03/01/2020 157 (H) 70 - 100 MG/DL Final   65/78/4696 295 (H) 70 - 100 MG/DL Final   28/41/3244 010 (H) 70 - 100 MG/DL Final          Problems Addressed Today  Encounter Diagnoses   Name Primary?   ? Mycobacterium avium-intracellulare complex (HCC) Yes   ? Other specified disorders of arteries and arterioles (HCC)    ? Morbid (severe) obesity due to excess calories (HCC)    ? Bronchiectasis without complication (HCC)    ? Primary hypertension    ? OSA (obstructive sleep apnea)    ? Obesity (BMI 30-39.9)        Assessment and Plan    I am concerned about Mrs. Beedle.  She has longstanding pulmonary issues and MAI infection.  She has some hypoxemia today.  She has some peripheral cyanosis.  Would like to check her oxygen, get her on some oxygen, get a chest CT scan performed, and have her see the pulmonary team.  She may end up needing to be admitted to the hospital.  Her volume status looks good.  We need to repeat an echocardiogram.  Her recent lab work showed hemoglobin 13.7, platelet count 225, white blood cell count 11.5.  Creatinine 1.45, potassium 4.  Her recent LDL cholesterol was 101.  Further recommendations are pending these results.    ([id number])               Current Medications (including today's revisions)  ? atorvastatin (LIPITOR) 40 mg tablet Take one tablet by mouth daily.   ? Blood-Glucose Sensor (DEXCOM G6 SENSOR) devi Use 1 each as directed as directed. Change every 10 days   ? budesonide (ENTOCORT EC) 3 mg capsule TAKE 2 CAPSULES BY MOUTH DAILY.   ? buPROPion XL (WELLBUTRIN XL) 300 mg tablet Take one tablet by mouth every morning. Do not crush or chew.   ? dapsone 100 mg tablet Take one tablet by mouth daily. Take this to prevent infection while your prednisone is 20mg  or higher, may discontinue once your prednisone dose if lower than 20mg    ? DEXCOM G6 RECEIVER misc Use 1 each as directed daily.   ? DEXCOM G6 TRANSMITTER devi Use 1 each as directed as directed. Every 90 days   ? duloxetine  DR (CYMBALTA) 60 mg capsule Take one capsule by mouth daily with breakfast.   ? glucagon (BAQSIMI) 3 mg/actuation nasal spray Use 1 spray in single nostril. If no response, may repeat in 15 minutes using second device.   ? icosapent ethyL (VASCEPA) 1 gram capsule Take two capsules by mouth twice daily with meals.   ? insulin aspart (U-100) (NOVOLOG FLEXPEN U-100 INSULIN) 100 unit/mL (3 mL) injection PEN Inject 20 units with breakfast, 25 units with lunch and dinner, 8 units with snacks. Add in correction scale based on pre meal blood sugar. Max dose 125 U per day.   ? insulin glargine (BASAGLAR KWIKPEN U-100 INSULIN) 100 unit/mL (3 mL) injection PEN Inject thirty Units under the skin daily. E11.65   ? insulin pen needles (disposable) (B-D NANO) 32 gauge x 5/32 pen needle Use one each as directed before meals and at bedtime. Use with insulin injections.   ? nebivolol (BYSTOLIC) 10 mg tablet Take one tablet by mouth daily.   ? ondansetron HCL (ZOFRAN) 4 mg tablet Take one tablet by mouth every 8 hours.   ? pantoprazole DR (PROTONIX) 40 mg tablet Take one tablet by mouth daily.   ? polyethylene glycol 3350 (MIRALAX) 17 gram/dose powder Take seventeen g by mouth twice daily.   ? predniSONE (DELTASONE) 10 mg tablet TAKE TWO TABLETS BY MOUTH DAILY WITH BREAKFAST.   ? promethazine (PHENERGAN) 25 mg tablet Take one tablet by mouth every 6 hours as needed for Nausea or Vomiting.   ? traMADoL (ULTRAM) 50 mg tablet Take one tablet by mouth every 6 hours as needed for Pain.   ? vedolizumab (ENTYVIO IV) Administer  through vein every 8 weeks.

## 2020-03-13 NOTE — Telephone Encounter
Patient called and left a message on this nurse's line stating that she just left her cardiology appointment and they determined that she could benefit for home oxygen, however they asked the patient to reach out to PCP for this. For PCP to prescribe this and for insurance to cover this. An appointment with the prescribing physician is needed with the appropriate documentation of O2 sats, need for oxygen therapy, benefit from oxygen therapy. Called patient back and left a message on machine discussing this. Asked patient to call scheduling to schedule an appointment with either Dr. Patsey Berthold or with her nurse practitioner, Ander Purpura, APRN. Clayton Lefort, RN

## 2020-03-13 NOTE — Progress Notes
Pt O2 sat noted to be 88-89% on room air.  Pt says she feels SOB but that has been normal for her since August.  Pt then placed on 1L O2 NC.  O2 increased to 9!% pt tolerating well.  Pt taken by w/c to radiology for CT chest.  May need to coordinate with Dr. Patsey Berthold on obtaining Home O2 for this patient.

## 2020-03-14 ENCOUNTER — Encounter: Admit: 2020-03-14 | Discharge: 2020-03-14 | Payer: BC Managed Care – PPO

## 2020-03-20 ENCOUNTER — Encounter: Admit: 2020-03-20 | Discharge: 2020-03-20 | Payer: BC Managed Care – PPO

## 2020-03-20 MED ORDER — PROMETHAZINE 25 MG PO TAB
25 mg | ORAL_TABLET | ORAL | 3 refills | 7.00000 days | Status: AC | PRN
Start: 2020-03-20 — End: ?

## 2020-03-21 ENCOUNTER — Encounter: Admit: 2020-03-21 | Discharge: 2020-03-21 | Payer: BC Managed Care – PPO

## 2020-03-21 ENCOUNTER — Ambulatory Visit: Admit: 2020-03-21 | Discharge: 2020-03-21 | Payer: BC Managed Care – PPO

## 2020-03-21 DIAGNOSIS — R0902 Hypoxemia: Secondary | ICD-10-CM

## 2020-03-21 DIAGNOSIS — N179 Acute kidney failure, unspecified: Secondary | ICD-10-CM

## 2020-03-21 NOTE — Telephone Encounter
Regarding: Tina Snyder  ----- Message from Tina Ora, Tina Snyder sent at 03/21/2020  9:34 AM CST -----  Pt currently admitted to [facility] in [place]. Please have CT Chest at CXR clouded over. Appt is Thursday. Pt is also having an Echo done today. We will need report. Thank you      ----- Message sent from Tina Ora, Tina Snyder to Tina Snyder at 03/21/2020  8:52 AM -----   Good morning,      I am assuming you had a chest CT since they ruled out a PE? Which hospital are you at so we can get the imaging sent over to [place]. That is great they are getting you the oxygen. In the clinic it can take some time to get the right testing completed for insurance to approve oxygen.      You have not had lung function testing at [facility] in a few years but we can do a short version of that here in the office.    Tina Ora, Tina Snyder      ----- Message -----       From:Tina Snyder       Sent:03/21/2020  8:48 AM CST         To:Tina Snyder    Subject:Tina Snyder    I will be going home on oxygen. Trying to get it set up now.   Only question is if there is a smaller, continuous oxygen pack for when I leave the house. They said I will have to haul a big Tina tank around outside of the home.     The doctor was going to try and get ahold of Tina Snyder to see if their was anything else she wanted done before I come down Thursday       ----- Message -----       From:Tina Snyder       Sent:03/21/2020  8:46 AM CST         ZO:[id number] Tina Snyder    Subject:Tina Snyder    Good Morning,       I am so glad you do not have a pulmonary embolism. That is great news! Any updates this morning?    Tina Ora, Tina Snyder      ----- Message -----       From:Tina Snyder       Sent:03/20/2020  6:29 PM CST         To:Tina Snyder    Subject:Tina Snyder, I am being admitted to our hospital for observation. Pe has been ruled out, so they say and my Covid test is negative       ----- Message -----       From:Tina Snyder       Sent:03/20/2020  1:18 PM CST         To:Tina Snyder    Subject:Tina Snyder    At our er in [name]. On 4 liters of oxygen, Tina is 94.       ----- Message -----       From:Tina Snyder       Sent:03/20/2020 11:55 AM CST         VW:[id number] Tina Snyder    Subject:Tina Snyder that is very low and concerning and is an emergency. I completely understand not wanting the extra expense but you need to be seen and evaluated in a timely manner. Even if we see you this week and your oxygen levels  are low we will need to do multiple tests to figure out why your oxygen is low and a test to prove to insurance you need oxygen before they will cover it. All of these things can take weeks in an out patient settings.     One of the concerns if you could have a pulmonary embolism. This is a medical emergency and you would need to be started on medication immediately as a blood clot in the lungs can be fatal. Please go to the ER. I have discussed with Tina. Tina Snyder and that is what she will tell you on Thursday.     Thank you,     Tina Ora, Tina Snyder      ----- Message -----       From:Tina Snyder       Sent:03/20/2020 11:38 AM CST         [name]    Subject:Tina Snyder    Hi Tina. I am sorry I missed your call.   I have not gone to the ER. We called, but did not go.   My Tina levels have been 77-82.   I really don?t want to have another bill when we see you on Thursday.     I hope we can get some answers then.   Tina Snyder

## 2020-03-22 NOTE — Patient Instructions
-   If you have any questions regarding your appointment or any concerns, please contact  , RN at [phone].    - If you need to schedule or reschedule an appointment, please call [phone].     -For refills on medications, please have your pharmacy fax a refill authorization request form to our office at [phone]. Please allow at least 3 business days for refill requests.    - For any urgent issues after business hours, please call [phone] and have the on call pulmonary physician paged.

## 2020-03-22 NOTE — Progress Notes
Date of Service: 03/23/2020    Subjective:             Tina Snyder is a 54 y.o. female.  She has SOB for 6 weeks. No cough or chest pain. On prednisone since august for crohn's disease. She finished dapsone this week for PJP prophylaxis. She now taking prednisone 10 mg every day and then will be dropping to 5 mg mid week next week. Will be finishing prednisone in 2 1/2 weeks. She feels tired. She is on 3 liters of O2 at night and at RA. Started on keflex on Tuesday for UTI. Feels SOB sometimes even at rest. No exposure to mold. She sleeps for 8-9 hrs per night. Bedtime is at 8:30pm and puts CPAP at 10:00pm. She falls asleep in 20-30 minutes. She wakes up at 8:30am. She does not nap during daytime.        Review of Systems    All ten systems were reviewed and negative except for those in HPI.    Objective:         ? atorvastatin (LIPITOR) 40 mg tablet Take one tablet by mouth daily.   ? Blood-Glucose Sensor (DEXCOM G6 SENSOR) devi Use 1 each as directed as directed. Change every 10 days   ? budesonide (ENTOCORT EC) 3 mg capsule TAKE 2 CAPSULES BY MOUTH DAILY.   ? buPROPion XL (WELLBUTRIN XL) 300 mg tablet Take one tablet by mouth every morning. Do not crush or chew.   ? cephalexin (KEFLEX) 250 mg capsule Take 250 mg by mouth four times daily.   ? DEXCOM G6 RECEIVER misc Use 1 each as directed daily.   ? DEXCOM G6 TRANSMITTER devi Use 1 each as directed as directed. Every 90 days   ? duloxetine DR (CYMBALTA) 60 mg capsule Take one capsule by mouth daily with breakfast.   ? glucagon (BAQSIMI) 3 mg/actuation nasal spray Use 1 spray in single nostril. If no response, may repeat in 15 minutes using second device.   ? icosapent ethyL (VASCEPA) 1 gram capsule Take two capsules by mouth twice daily with meals.   ? insulin aspart (U-100) (NOVOLOG FLEXPEN U-100 INSULIN) 100 unit/mL (3 mL) injection PEN Inject 20 units with breakfast, 25 units with lunch and dinner, 8 units with snacks. Add in correction scale based on pre meal blood sugar. Max dose 125 U per day.   ? insulin glargine (BASAGLAR KWIKPEN U-100 INSULIN) 100 unit/mL (3 mL) injection PEN Inject thirty Units under the skin daily. E11.65   ? insulin pen needles (disposable) (B-D NANO) 32 gauge x 5/32 pen needle Use one each as directed before meals and at bedtime. Use with insulin injections.   ? nebivolol (BYSTOLIC) 10 mg tablet Take one tablet by mouth daily.   ? nystatin (MYCOSTATIN) 100,000 units/mL oral suspension Take 5 mL by mouth four times daily for 7 days.   ? ondansetron HCL (ZOFRAN) 4 mg tablet Take one tablet by mouth every 8 hours.   ? pantoprazole DR (PROTONIX) 40 mg tablet Take one tablet by mouth daily.   ? polyethylene glycol 3350 (MIRALAX) 17 gram/dose powder Take seventeen g by mouth twice daily.   ? predniSONE (DELTASONE) 10 mg tablet TAKE TWO TABLETS BY MOUTH DAILY WITH BREAKFAST.   ? promethazine (PHENERGAN) 25 mg tablet Take one tablet by mouth every 6 hours as needed for Nausea or Vomiting.   ? traMADoL (ULTRAM) 50 mg tablet Take one tablet by mouth every 6 hours as needed for  Pain.   ? vedolizumab (ENTYVIO IV) Administer  through vein every 8 weeks.     Vitals:    03/23/20 1210   BP: (!) 152/89   Weight: 105.9 kg (233 lb 8 oz)   Height: 162.6 cm (64)   PainSc: Zero     Body mass index is 40.08 kg/m?Marland Kitchen     Physical Exam  Constitutional: She is oriented to person, place, and time. She appears well-developed.   HENT:   Head: Normocephalic and atraumatic.   Eyes: Conjunctivae and EOM are normal. Pupils are equal, round, and reactive to light.   Neck: Normal range of motion. Neck supple.   Cardiovascular: Normal rate, regular rhythm, normal heart sounds and intact distal pulses.    Pulmonary/Chest: Effort normal and breath sounds normal.   Abdominal: Soft. Bowel sounds are normal.   Musculoskeletal: Normal range of motion.   Neurological: She is alert and oriented to person, place, and time. She has normal reflexes.   Skin: Skin is warm and dry. Psychiatric: She has a normal mood and affect.   Nursing note and vitals reviewed.       Assessment and Plan:    Problem   Morbid (Severe) Obesity Due to Excess Calories (Hcc)    BMI 40     Osa (Obstructive Sleep Apnea)    Sleep study done on 10/10/2017 which showed AHI of 133/h.  Time with saturation less than or equal to 88% for 70 minutes.  CPAP at a pressure of 14 cm was found to be effective.     DME- [facility]    Compliance report today showed 93% usage for more than 4 hours average daily use of 8 hours and 5 minutes out of CPAP 5-12 cm of water AHI residual 0.5 events per hour     Sob (Shortness of Breath)    SOB with exertion. Discharged on 3 liters of o2 recently. Denies any cough or chest pain or fever.  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  Echo on 03/21/20 was normal.           OSA (obstructive sleep apnea)  Her machine was downloaded today which showed excellent compliance at 97% with residual apnea index of 4.7. I discussed the download data with the patient and congratulated her regarding the regular usage of the machine. Advised her to avoid sedatives and alcohol as it can worsen the sleep apnea. Also advised her to be cautious while operating a motor vehicle when feeling sleepy. We will continue her current settings for now. Advised her to change the mask every 3-6 months, change the hose every 6 months and change the filters every month.     Morbid (severe) obesity due to excess calories (HCC)  Class I (BMI ?30 to 35), class II (BMI ?35 to 40), and class III (BMI ?40). She has class 111 obesity.    Advised her to exercise on a regular basis and watch her diet. I educated her about the importance of losing weight.    SOB (shortness of breath)  Unsure of the etiology. Probably cardiac or OHS but serum bicarbonate is within normal limits. We will check PFT's and exercise oximetry. We will check ILD panel. Will check with radiology regarding CT chest done on 03/20/20.     RTC in 3 months.

## 2020-03-23 ENCOUNTER — Encounter: Admit: 2020-03-23 | Discharge: 2020-03-23 | Payer: BC Managed Care – PPO

## 2020-03-23 ENCOUNTER — Ambulatory Visit: Admit: 2020-03-23 | Discharge: 2020-03-23 | Payer: BC Managed Care – PPO

## 2020-03-23 ENCOUNTER — Ambulatory Visit: Admit: 2020-03-23 | Discharge: 2020-03-24 | Payer: BC Managed Care – PPO

## 2020-03-23 DIAGNOSIS — N183 CKD (chronic kidney disease) stage 3, GFR 30-59 ml/min (HCC): Secondary | ICD-10-CM

## 2020-03-23 DIAGNOSIS — E139 Other specified diabetes mellitus without complications: Secondary | ICD-10-CM

## 2020-03-23 DIAGNOSIS — IMO0002 Ulcer: Secondary | ICD-10-CM

## 2020-03-23 DIAGNOSIS — G43909 Migraine, unspecified, not intractable, without status migrainosus: Secondary | ICD-10-CM

## 2020-03-23 DIAGNOSIS — R0602 Shortness of breath: Secondary | ICD-10-CM

## 2020-03-23 DIAGNOSIS — K7581 Nonalcoholic steatohepatitis (NASH): Secondary | ICD-10-CM

## 2020-03-23 DIAGNOSIS — E119 Type 2 diabetes mellitus without complications: Secondary | ICD-10-CM

## 2020-03-23 DIAGNOSIS — M797 Fibromyalgia: Secondary | ICD-10-CM

## 2020-03-23 DIAGNOSIS — K859 Acute pancreatitis without necrosis or infection, unspecified: Secondary | ICD-10-CM

## 2020-03-23 DIAGNOSIS — I10 Essential (primary) hypertension: Secondary | ICD-10-CM

## 2020-03-23 DIAGNOSIS — I8290 Acute embolism and thrombosis of unspecified vein: Secondary | ICD-10-CM

## 2020-03-23 DIAGNOSIS — E785 Hyperlipidemia, unspecified: Secondary | ICD-10-CM

## 2020-03-23 DIAGNOSIS — I82409 Acute embolism and thrombosis of unspecified deep veins of unspecified lower extremity: Secondary | ICD-10-CM

## 2020-03-23 DIAGNOSIS — J302 Other seasonal allergic rhinitis: Secondary | ICD-10-CM

## 2020-03-23 DIAGNOSIS — Z8619 Personal history of other infectious and parasitic diseases: Secondary | ICD-10-CM

## 2020-03-23 DIAGNOSIS — K219 Gastro-esophageal reflux disease without esophagitis: Secondary | ICD-10-CM

## 2020-03-23 DIAGNOSIS — K56609 Unspecified intestinal obstruction, unspecified as to partial versus complete obstruction: Secondary | ICD-10-CM

## 2020-03-23 DIAGNOSIS — Z789 Other specified health status: Secondary | ICD-10-CM

## 2020-03-23 DIAGNOSIS — K5090 Crohn's disease, unspecified, without complications: Secondary | ICD-10-CM

## 2020-03-23 DIAGNOSIS — K639 Disease of intestine, unspecified: Secondary | ICD-10-CM

## 2020-03-23 DIAGNOSIS — D699 Hemorrhagic condition, unspecified: Secondary | ICD-10-CM

## 2020-03-23 DIAGNOSIS — R42 Dizziness and giddiness: Secondary | ICD-10-CM

## 2020-03-23 DIAGNOSIS — G40909 Epilepsy, unspecified, not intractable, without status epilepticus: Secondary | ICD-10-CM

## 2020-03-23 DIAGNOSIS — D4959 Neoplasm of unspecified behavior of other genitourinary organ: Secondary | ICD-10-CM

## 2020-03-23 LAB — CBC AND DIFF
Lab: 1 % — ABNORMAL LOW (ref >60)
Lab: 10 K/UL (ref 4.5–11.0)
Lab: 228 K/UL (ref 150–400)
Lab: 3.9 M/UL — ABNORMAL LOW (ref 4.0–5.0)
Lab: 30 pg — ABNORMAL LOW (ref 26–34)
Lab: 32 g/dL (ref 32.0–36.0)
Lab: 5 % — ABNORMAL HIGH (ref 4–12)
Lab: 8.4 FL (ref 7–11)
Lab: 9 % — ABNORMAL LOW (ref 24–44)
Lab: 9.1 K/UL — ABNORMAL HIGH (ref 1.8–7.0)

## 2020-03-23 LAB — MPO/PR3 W REFLEX TO ANCA

## 2020-03-23 LAB — COMPREHENSIVE METABOLIC PANEL
Lab: 141 MMOL/L (ref 137–147)
Lab: 4.1 MMOL/L (ref 3.5–5.1)

## 2020-03-23 LAB — IMMUNOGLOBULINS-IGA,IGG,IGM
Lab: 190 mg/dL — ABNORMAL HIGH (ref 70–390)
Lab: 357 mg/dL — ABNORMAL LOW (ref 762–1488)
Lab: 36 mg/dL — ABNORMAL LOW (ref 38–328)

## 2020-03-23 LAB — JO 1 ANTIBODIES

## 2020-03-23 LAB — CCP IGG ANTIBODY

## 2020-03-23 LAB — SED RATE: Lab: <1 mm/h — ABNORMAL HIGH (ref 0–30)

## 2020-03-23 LAB — RHEUMATOID FACTOR (RF): Lab: <10 [IU]/mL (ref <25)

## 2020-03-23 LAB — C REACTIVE PROTEIN (CRP): Lab: 0.4 mg/dL — ABNORMAL HIGH (ref <1.0)

## 2020-03-23 LAB — SCL 70 ANTIBODIES

## 2020-03-23 LAB — ANTI SSA ANTI SSB AB

## 2020-03-23 LAB — CREATINE KINASE-CPK: Lab: 40 U/L — ABNORMAL LOW (ref >60)

## 2020-03-23 MED ORDER — NYSTATIN 100,000 UNIT/ML PO SUSP
500000 [IU] | Freq: Four times a day (QID) | ORAL | 1 refills | 12.00000 days | Status: AC
Start: 2020-03-23 — End: ?

## 2020-03-23 NOTE — Assessment & Plan Note
Class I (BMI ?30 to 35), class II (BMI ?35 to 40), and class III (BMI ?40). She has class 111 obesity.    Advised her to exercise on a regular basis and watch her diet. I educated her about the importance of losing weight.

## 2020-03-23 NOTE — Assessment & Plan Note
Her machine was downloaded today which showed excellent compliance at 97% with residual apnea index of 4.7. I discussed the download data with the patient and congratulated her regarding the regular usage of the machine. Advised her to avoid sedatives and alcohol as it can worsen the sleep apnea. Also advised her to be cautious while operating a motor vehicle when feeling sleepy. We will continue her current settings for now. Advised her to change the mask every 3-6 months, change the hose every 6 months and change the filters every month.

## 2020-03-24 DIAGNOSIS — J479 Bronchiectasis, uncomplicated: Secondary | ICD-10-CM

## 2020-03-24 DIAGNOSIS — A310 Pulmonary mycobacterial infection: Secondary | ICD-10-CM

## 2020-03-24 DIAGNOSIS — I7789 Other specified disorders of arteries and arterioles: Secondary | ICD-10-CM

## 2020-03-24 DIAGNOSIS — E669 Obesity, unspecified: Secondary | ICD-10-CM

## 2020-03-24 DIAGNOSIS — I10 Essential (primary) hypertension: Secondary | ICD-10-CM

## 2020-03-24 DIAGNOSIS — G4733 Obstructive sleep apnea (adult) (pediatric): Secondary | ICD-10-CM

## 2020-04-06 ENCOUNTER — Encounter: Admit: 2020-04-06 | Discharge: 2020-04-06 | Payer: BC Managed Care – PPO

## 2020-04-11 ENCOUNTER — Encounter: Admit: 2020-04-11 | Discharge: 2020-04-11 | Payer: BC Managed Care – PPO

## 2020-04-11 DIAGNOSIS — R0989 Other specified symptoms and signs involving the circulatory and respiratory systems: Secondary | ICD-10-CM

## 2020-04-11 DIAGNOSIS — Z20822 Encounter for screening laboratory testing for COVID-19 virus: Secondary | ICD-10-CM

## 2020-04-22 ENCOUNTER — Encounter: Admit: 2020-04-22 | Discharge: 2020-04-22 | Payer: BC Managed Care – PPO

## 2020-04-22 DIAGNOSIS — K5080 Crohn's disease of both small and large intestine without complications: Secondary | ICD-10-CM

## 2020-04-22 MED ORDER — TRAMADOL 50 MG PO TAB
ORAL_TABLET | Freq: Four times a day (QID) | 0 refills | PRN
Start: 2020-04-22 — End: ?

## 2020-04-26 ENCOUNTER — Encounter: Admit: 2020-04-26 | Discharge: 2020-04-26 | Payer: BC Managed Care – PPO

## 2020-05-04 ENCOUNTER — Ambulatory Visit: Admit: 2020-05-04 | Discharge: 2020-05-05 | Payer: BC Managed Care – PPO

## 2020-05-04 ENCOUNTER — Encounter: Admit: 2020-05-04 | Discharge: 2020-05-04 | Payer: BC Managed Care – PPO

## 2020-05-04 DIAGNOSIS — I10 Essential (primary) hypertension: Secondary | ICD-10-CM

## 2020-05-04 DIAGNOSIS — N183 CKD (chronic kidney disease) stage 3, GFR 30-59 ml/min (HCC): Secondary | ICD-10-CM

## 2020-05-04 DIAGNOSIS — R42 Dizziness and giddiness: Secondary | ICD-10-CM

## 2020-05-04 DIAGNOSIS — J302 Other seasonal allergic rhinitis: Secondary | ICD-10-CM

## 2020-05-04 DIAGNOSIS — G43909 Migraine, unspecified, not intractable, without status migrainosus: Secondary | ICD-10-CM

## 2020-05-04 DIAGNOSIS — Z8619 Personal history of other infectious and parasitic diseases: Secondary | ICD-10-CM

## 2020-05-04 DIAGNOSIS — E139 Other specified diabetes mellitus without complications: Secondary | ICD-10-CM

## 2020-05-04 DIAGNOSIS — K859 Acute pancreatitis without necrosis or infection, unspecified: Secondary | ICD-10-CM

## 2020-05-04 DIAGNOSIS — K639 Disease of intestine, unspecified: Secondary | ICD-10-CM

## 2020-05-04 DIAGNOSIS — K56609 Unspecified intestinal obstruction, unspecified as to partial versus complete obstruction: Secondary | ICD-10-CM

## 2020-05-04 DIAGNOSIS — D699 Hemorrhagic condition, unspecified: Secondary | ICD-10-CM

## 2020-05-04 DIAGNOSIS — M797 Fibromyalgia: Secondary | ICD-10-CM

## 2020-05-04 DIAGNOSIS — E119 Type 2 diabetes mellitus without complications: Secondary | ICD-10-CM

## 2020-05-04 DIAGNOSIS — I82409 Acute embolism and thrombosis of unspecified deep veins of unspecified lower extremity: Secondary | ICD-10-CM

## 2020-05-04 DIAGNOSIS — D4959 Neoplasm of unspecified behavior of other genitourinary organ: Secondary | ICD-10-CM

## 2020-05-04 DIAGNOSIS — K219 Gastro-esophageal reflux disease without esophagitis: Secondary | ICD-10-CM

## 2020-05-04 DIAGNOSIS — I8290 Acute embolism and thrombosis of unspecified vein: Secondary | ICD-10-CM

## 2020-05-04 DIAGNOSIS — Z789 Other specified health status: Secondary | ICD-10-CM

## 2020-05-04 DIAGNOSIS — IMO0002 Ulcer: Secondary | ICD-10-CM

## 2020-05-04 DIAGNOSIS — K7581 Nonalcoholic steatohepatitis (NASH): Secondary | ICD-10-CM

## 2020-05-04 DIAGNOSIS — E785 Hyperlipidemia, unspecified: Secondary | ICD-10-CM

## 2020-05-04 DIAGNOSIS — K50918 Crohn's disease, unspecified, with other complication: Secondary | ICD-10-CM

## 2020-05-04 DIAGNOSIS — G40909 Epilepsy, unspecified, not intractable, without status epilepticus: Secondary | ICD-10-CM

## 2020-05-04 DIAGNOSIS — K5090 Crohn's disease, unspecified, without complications: Secondary | ICD-10-CM

## 2020-05-04 MED ORDER — VEDOLIZUMAB IVPB
300 mg | Freq: Once | INTRAVENOUS | 0 refills | Status: CP
Start: 2020-05-04 — End: ?
  Administered 2020-05-04 (×2): 300 mg via INTRAVENOUS

## 2020-05-04 MED ORDER — ACETAMINOPHEN 500 MG PO TAB
500 mg | Freq: Once | ORAL | 0 refills | Status: CP
Start: 2020-05-04 — End: ?

## 2020-05-04 MED ORDER — DIPHENHYDRAMINE HCL 50 MG/ML IJ SOLN
25-50 mg | Freq: Once | INTRAVENOUS | 0 refills
Start: 2020-05-04 — End: ?

## 2020-05-04 MED ORDER — ACETAMINOPHEN 500 MG PO TAB
500 mg | Freq: Once | ORAL | 0 refills
Start: 2020-05-04 — End: ?

## 2020-05-04 MED ORDER — DIPHENHYDRAMINE HCL 50 MG/ML IJ SOLN
25-50 mg | Freq: Once | INTRAVENOUS | 0 refills | Status: CP
Start: 2020-05-04 — End: ?
  Administered 2020-05-04: 21:00:00 50 mg via INTRAVENOUS

## 2020-05-04 MED ORDER — VEDOLIZUMAB IVPB
300 mg | Freq: Once | INTRAVENOUS | 0 refills
Start: 2020-05-04 — End: ?

## 2020-05-04 MED ADMIN — DIPHENHYDRAMINE HCL 50 MG/ML IJ SOLN [2508]: 50 mg | INTRAVENOUS | @ 22:00:00 | Stop: 2020-05-04 | NDC 00641037621

## 2020-05-09 ENCOUNTER — Encounter: Admit: 2020-05-09 | Discharge: 2020-05-09 | Payer: BC Managed Care – PPO

## 2020-05-15 ENCOUNTER — Ambulatory Visit: Admit: 2020-05-15 | Discharge: 2020-05-15 | Payer: BC Managed Care – PPO

## 2020-05-15 ENCOUNTER — Encounter: Admit: 2020-05-15 | Discharge: 2020-05-15 | Payer: BC Managed Care – PPO

## 2020-05-15 DIAGNOSIS — E782 Mixed hyperlipidemia: Secondary | ICD-10-CM

## 2020-05-15 DIAGNOSIS — R0602 Shortness of breath: Secondary | ICD-10-CM

## 2020-05-15 MED ORDER — ATORVASTATIN 40 MG PO TAB
ORAL_TABLET | Freq: Every day | 3 refills | Status: AC
Start: 2020-05-15 — End: ?

## 2020-05-16 ENCOUNTER — Encounter: Admit: 2020-05-16 | Discharge: 2020-05-16 | Payer: BC Managed Care – PPO

## 2020-05-16 ENCOUNTER — Ambulatory Visit: Admit: 2020-05-16 | Discharge: 2020-05-17 | Payer: BC Managed Care – PPO

## 2020-05-16 DIAGNOSIS — K859 Acute pancreatitis without necrosis or infection, unspecified: Secondary | ICD-10-CM

## 2020-05-16 DIAGNOSIS — R42 Dizziness and giddiness: Secondary | ICD-10-CM

## 2020-05-16 DIAGNOSIS — Z8619 Personal history of other infectious and parasitic diseases: Secondary | ICD-10-CM

## 2020-05-16 DIAGNOSIS — E139 Other specified diabetes mellitus without complications: Secondary | ICD-10-CM

## 2020-05-16 DIAGNOSIS — N183 CKD (chronic kidney disease) stage 3, GFR 30-59 ml/min (HCC): Secondary | ICD-10-CM

## 2020-05-16 DIAGNOSIS — D699 Hemorrhagic condition, unspecified: Secondary | ICD-10-CM

## 2020-05-16 DIAGNOSIS — I82409 Acute embolism and thrombosis of unspecified deep veins of unspecified lower extremity: Secondary | ICD-10-CM

## 2020-05-16 DIAGNOSIS — K56609 Unspecified intestinal obstruction, unspecified as to partial versus complete obstruction: Secondary | ICD-10-CM

## 2020-05-16 DIAGNOSIS — K219 Gastro-esophageal reflux disease without esophagitis: Secondary | ICD-10-CM

## 2020-05-16 DIAGNOSIS — K7581 Nonalcoholic steatohepatitis (NASH): Secondary | ICD-10-CM

## 2020-05-16 DIAGNOSIS — K5080 Crohn's disease of both small and large intestine without complications: Secondary | ICD-10-CM

## 2020-05-16 DIAGNOSIS — G40909 Epilepsy, unspecified, not intractable, without status epilepticus: Secondary | ICD-10-CM

## 2020-05-16 DIAGNOSIS — G43909 Migraine, unspecified, not intractable, without status migrainosus: Secondary | ICD-10-CM

## 2020-05-16 DIAGNOSIS — IMO0002 Ulcer: Secondary | ICD-10-CM

## 2020-05-16 DIAGNOSIS — K5090 Crohn's disease, unspecified, without complications: Secondary | ICD-10-CM

## 2020-05-16 DIAGNOSIS — Z789 Other specified health status: Secondary | ICD-10-CM

## 2020-05-16 DIAGNOSIS — E119 Type 2 diabetes mellitus without complications: Secondary | ICD-10-CM

## 2020-05-16 DIAGNOSIS — D4959 Neoplasm of unspecified behavior of other genitourinary organ: Secondary | ICD-10-CM

## 2020-05-16 DIAGNOSIS — E785 Hyperlipidemia, unspecified: Secondary | ICD-10-CM

## 2020-05-16 DIAGNOSIS — J302 Other seasonal allergic rhinitis: Secondary | ICD-10-CM

## 2020-05-16 DIAGNOSIS — I10 Essential (primary) hypertension: Secondary | ICD-10-CM

## 2020-05-16 DIAGNOSIS — I8290 Acute embolism and thrombosis of unspecified vein: Secondary | ICD-10-CM

## 2020-05-16 DIAGNOSIS — K639 Disease of intestine, unspecified: Secondary | ICD-10-CM

## 2020-05-16 DIAGNOSIS — M797 Fibromyalgia: Secondary | ICD-10-CM

## 2020-05-22 ENCOUNTER — Encounter: Admit: 2020-05-22 | Discharge: 2020-05-22 | Payer: BC Managed Care – PPO

## 2020-05-22 DIAGNOSIS — K50018 Crohn's disease of small intestine with other complication: Secondary | ICD-10-CM

## 2020-05-22 MED ORDER — BUDESONIDE 3 MG PO CECX
ORAL_CAPSULE | Freq: Every day | ORAL | 1 refills | 30.00000 days | Status: AC
Start: 2020-05-22 — End: ?

## 2020-05-23 ENCOUNTER — Encounter: Admit: 2020-05-23 | Discharge: 2020-05-23 | Payer: BC Managed Care – PPO

## 2020-05-28 ENCOUNTER — Encounter: Admit: 2020-05-28 | Discharge: 2020-05-28 | Payer: BC Managed Care – PPO

## 2020-05-28 DIAGNOSIS — K5080 Crohn's disease of both small and large intestine without complications: Secondary | ICD-10-CM

## 2020-05-28 MED ORDER — TRAMADOL 50 MG PO TAB
ORAL_TABLET | Freq: Four times a day (QID) | 0 refills | PRN
Start: 2020-05-28 — End: ?

## 2020-06-09 ENCOUNTER — Encounter

## 2020-06-09 DIAGNOSIS — K5080 Crohn's disease of both small and large intestine without complications: Secondary | ICD-10-CM

## 2020-06-09 MED ORDER — TRAMADOL 50 MG PO TAB
1 | ORAL_TABLET | ORAL | 0 refills | Status: AC | PRN
Start: 2020-06-09 — End: ?

## 2020-06-12 ENCOUNTER — Encounter

## 2020-06-12 DIAGNOSIS — G4733 Obstructive sleep apnea (adult) (pediatric): Secondary | ICD-10-CM

## 2020-06-12 NOTE — Telephone Encounter
Orders placed per provider notes.  Pressure changed remotely to 8-15 cm H2O.  Nocox order placed.    Routing to admin to assist with scheduling PFTs.

## 2020-06-13 ENCOUNTER — Encounter: Admit: 2020-06-13 | Discharge: 2020-06-13 | Payer: BC Managed Care – PPO

## 2020-06-13 NOTE — Telephone Encounter
Order to repeat ono on cpap in one week placed as discussed per chart note on 06/12/20    DME: [facility]  Will continue to follow up on order.     DME notified pressure changed remotely to 8-15cm

## 2020-06-19 ENCOUNTER — Ambulatory Visit: Admit: 2020-06-19 | Discharge: 2020-06-19 | Payer: BC Managed Care – PPO

## 2020-06-19 ENCOUNTER — Encounter: Admit: 2020-06-19 | Discharge: 2020-06-19 | Payer: BC Managed Care – PPO

## 2020-06-19 DIAGNOSIS — R0602 Shortness of breath: Secondary | ICD-10-CM

## 2020-07-04 ENCOUNTER — Encounter: Admit: 2020-07-04 | Discharge: 2020-07-04 | Payer: BC Managed Care – PPO

## 2020-07-05 ENCOUNTER — Encounter: Admit: 2020-07-05 | Discharge: 2020-07-05 | Payer: BC Managed Care – PPO

## 2020-07-05 ENCOUNTER — Ambulatory Visit: Admit: 2020-07-05 | Discharge: 2020-07-06 | Payer: BC Managed Care – PPO

## 2020-07-05 DIAGNOSIS — G43909 Migraine, unspecified, not intractable, without status migrainosus: Secondary | ICD-10-CM

## 2020-07-05 DIAGNOSIS — K50918 Crohn's disease, unspecified, with other complication: Secondary | ICD-10-CM

## 2020-07-05 DIAGNOSIS — J302 Other seasonal allergic rhinitis: Secondary | ICD-10-CM

## 2020-07-05 DIAGNOSIS — K56609 Unspecified intestinal obstruction, unspecified as to partial versus complete obstruction: Secondary | ICD-10-CM

## 2020-07-05 DIAGNOSIS — E139 Other specified diabetes mellitus without complications: Secondary | ICD-10-CM

## 2020-07-05 DIAGNOSIS — Z789 Other specified health status: Secondary | ICD-10-CM

## 2020-07-05 DIAGNOSIS — K859 Acute pancreatitis without necrosis or infection, unspecified: Secondary | ICD-10-CM

## 2020-07-05 DIAGNOSIS — E119 Type 2 diabetes mellitus without complications: Secondary | ICD-10-CM

## 2020-07-05 DIAGNOSIS — K639 Disease of intestine, unspecified: Secondary | ICD-10-CM

## 2020-07-05 DIAGNOSIS — I82409 Acute embolism and thrombosis of unspecified deep veins of unspecified lower extremity: Secondary | ICD-10-CM

## 2020-07-05 DIAGNOSIS — M797 Fibromyalgia: Secondary | ICD-10-CM

## 2020-07-05 DIAGNOSIS — K7581 Nonalcoholic steatohepatitis (NASH): Secondary | ICD-10-CM

## 2020-07-05 DIAGNOSIS — Z8619 Personal history of other infectious and parasitic diseases: Secondary | ICD-10-CM

## 2020-07-05 DIAGNOSIS — R42 Dizziness and giddiness: Secondary | ICD-10-CM

## 2020-07-05 DIAGNOSIS — D4959 Neoplasm of unspecified behavior of other genitourinary organ: Secondary | ICD-10-CM

## 2020-07-05 DIAGNOSIS — D699 Hemorrhagic condition, unspecified: Secondary | ICD-10-CM

## 2020-07-05 DIAGNOSIS — E785 Hyperlipidemia, unspecified: Secondary | ICD-10-CM

## 2020-07-05 DIAGNOSIS — G40909 Epilepsy, unspecified, not intractable, without status epilepticus: Secondary | ICD-10-CM

## 2020-07-05 DIAGNOSIS — K5090 Crohn's disease, unspecified, without complications: Secondary | ICD-10-CM

## 2020-07-05 DIAGNOSIS — I8290 Acute embolism and thrombosis of unspecified vein: Secondary | ICD-10-CM

## 2020-07-05 DIAGNOSIS — IMO0002 Ulcer: Secondary | ICD-10-CM

## 2020-07-05 DIAGNOSIS — K219 Gastro-esophageal reflux disease without esophagitis: Secondary | ICD-10-CM

## 2020-07-05 DIAGNOSIS — I10 Essential (primary) hypertension: Secondary | ICD-10-CM

## 2020-07-05 DIAGNOSIS — N183 CKD (chronic kidney disease) stage 3, GFR 30-59 ml/min (HCC): Secondary | ICD-10-CM

## 2020-07-05 MED ORDER — ACETAMINOPHEN 500 MG PO TAB
500 mg | Freq: Once | ORAL | 0 refills
Start: 2020-07-05 — End: ?

## 2020-07-05 MED ORDER — VEDOLIZUMAB IVPB
300 mg | Freq: Once | INTRAVENOUS | 0 refills
Start: 2020-07-05 — End: ?

## 2020-07-05 MED ORDER — DIPHENHYDRAMINE HCL 50 MG/ML IJ SOLN
25-50 mg | Freq: Once | INTRAVENOUS | 0 refills
Start: 2020-07-05 — End: ?

## 2020-07-05 MED ORDER — DIPHENHYDRAMINE HCL 50 MG/ML IJ SOLN
25-50 mg | Freq: Once | INTRAVENOUS | 0 refills | Status: CP
Start: 2020-07-05 — End: ?

## 2020-07-05 MED ORDER — VEDOLIZUMAB IVPB
300 mg | Freq: Once | INTRAVENOUS | 0 refills | Status: CP
Start: 2020-07-05 — End: ?

## 2020-07-05 MED ORDER — ACETAMINOPHEN 500 MG PO TAB
500 mg | Freq: Once | ORAL | 0 refills | Status: CP
Start: 2020-07-05 — End: ?

## 2020-07-05 NOTE — Progress Notes
Pt began to itch half way through infusion.  Reaction protocol given with relief.

## 2020-07-12 ENCOUNTER — Encounter: Admit: 2020-07-12 | Discharge: 2020-07-12 | Payer: BC Managed Care – PPO

## 2020-07-12 MED ORDER — DEXLANSOPRAZOLE 60 MG PO CPDB
60 mg | ORAL_CAPSULE | Freq: Every day | ORAL | 5 refills | 30.00000 days | Status: AC
Start: 2020-07-12 — End: ?

## 2020-07-16 ENCOUNTER — Encounter: Admit: 2020-07-16 | Discharge: 2020-07-16 | Payer: BC Managed Care – PPO

## 2020-07-19 ENCOUNTER — Encounter: Admit: 2020-07-19 | Discharge: 2020-07-19 | Payer: BC Managed Care – PPO

## 2020-07-19 NOTE — Patient Instructions
-   If you have any questions regarding your appointment or any concerns, please contact  , RN at [phone].    - If you need to schedule or reschedule an appointment, please call [phone].     -For refills on medications, please have your pharmacy fax a refill authorization request form to our office at [phone]. Please allow at least 3 business days for refill requests.    - For any urgent issues after business hours, please call [phone] and have the on call pulmonary physician paged.

## 2020-07-19 NOTE — Telephone Encounter
Received fax from [facility] requesing last office note within last 6 months. Faxed not from visit with dietician and visit with meghan to [facility].

## 2020-07-20 ENCOUNTER — Ambulatory Visit: Admit: 2020-07-20 | Discharge: 2020-07-20 | Payer: BC Managed Care – PPO

## 2020-07-20 ENCOUNTER — Encounter: Admit: 2020-07-20 | Discharge: 2020-07-20 | Payer: BC Managed Care – PPO

## 2020-07-20 DIAGNOSIS — E119 Type 2 diabetes mellitus without complications: Secondary | ICD-10-CM

## 2020-07-20 DIAGNOSIS — Z789 Other specified health status: Secondary | ICD-10-CM

## 2020-07-20 DIAGNOSIS — I8290 Acute embolism and thrombosis of unspecified vein: Secondary | ICD-10-CM

## 2020-07-20 DIAGNOSIS — G43909 Migraine, unspecified, not intractable, without status migrainosus: Secondary | ICD-10-CM

## 2020-07-20 DIAGNOSIS — D4959 Neoplasm of unspecified behavior of other genitourinary organ: Secondary | ICD-10-CM

## 2020-07-20 DIAGNOSIS — IMO0002 Ulcer: Secondary | ICD-10-CM

## 2020-07-20 DIAGNOSIS — D699 Hemorrhagic condition, unspecified: Secondary | ICD-10-CM

## 2020-07-20 DIAGNOSIS — E139 Other specified diabetes mellitus without complications: Secondary | ICD-10-CM

## 2020-07-20 DIAGNOSIS — M797 Fibromyalgia: Secondary | ICD-10-CM

## 2020-07-20 DIAGNOSIS — R0602 Shortness of breath: Secondary | ICD-10-CM

## 2020-07-20 DIAGNOSIS — K859 Acute pancreatitis without necrosis or infection, unspecified: Secondary | ICD-10-CM

## 2020-07-20 DIAGNOSIS — I10 Essential (primary) hypertension: Secondary | ICD-10-CM

## 2020-07-20 DIAGNOSIS — K219 Gastro-esophageal reflux disease without esophagitis: Secondary | ICD-10-CM

## 2020-07-20 DIAGNOSIS — J302 Other seasonal allergic rhinitis: Secondary | ICD-10-CM

## 2020-07-20 DIAGNOSIS — R42 Dizziness and giddiness: Secondary | ICD-10-CM

## 2020-07-20 DIAGNOSIS — Z8619 Personal history of other infectious and parasitic diseases: Secondary | ICD-10-CM

## 2020-07-20 DIAGNOSIS — K56609 Unspecified intestinal obstruction, unspecified as to partial versus complete obstruction: Secondary | ICD-10-CM

## 2020-07-20 DIAGNOSIS — E785 Hyperlipidemia, unspecified: Secondary | ICD-10-CM

## 2020-07-20 DIAGNOSIS — N183 CKD (chronic kidney disease) stage 3, GFR 30-59 ml/min (HCC): Secondary | ICD-10-CM

## 2020-07-20 DIAGNOSIS — J479 Bronchiectasis, uncomplicated: Secondary | ICD-10-CM

## 2020-07-20 DIAGNOSIS — K7581 Nonalcoholic steatohepatitis (NASH): Secondary | ICD-10-CM

## 2020-07-20 DIAGNOSIS — I82409 Acute embolism and thrombosis of unspecified deep veins of unspecified lower extremity: Secondary | ICD-10-CM

## 2020-07-20 DIAGNOSIS — G40909 Epilepsy, unspecified, not intractable, without status epilepticus: Secondary | ICD-10-CM

## 2020-07-20 DIAGNOSIS — K639 Disease of intestine, unspecified: Secondary | ICD-10-CM

## 2020-07-20 DIAGNOSIS — K5090 Crohn's disease, unspecified, without complications: Secondary | ICD-10-CM

## 2020-07-21 ENCOUNTER — Encounter: Admit: 2020-07-21 | Discharge: 2020-07-21 | Payer: BC Managed Care – PPO

## 2020-07-21 DIAGNOSIS — IMO0002 Ulcer: Secondary | ICD-10-CM

## 2020-07-21 DIAGNOSIS — E139 Other specified diabetes mellitus without complications: Secondary | ICD-10-CM

## 2020-07-21 DIAGNOSIS — K219 Gastro-esophageal reflux disease without esophagitis: Secondary | ICD-10-CM

## 2020-07-21 DIAGNOSIS — Z8619 Personal history of other infectious and parasitic diseases: Secondary | ICD-10-CM

## 2020-07-21 DIAGNOSIS — K7581 Nonalcoholic steatohepatitis (NASH): Secondary | ICD-10-CM

## 2020-07-21 DIAGNOSIS — I8290 Acute embolism and thrombosis of unspecified vein: Secondary | ICD-10-CM

## 2020-07-21 DIAGNOSIS — E119 Type 2 diabetes mellitus without complications: Secondary | ICD-10-CM

## 2020-07-21 DIAGNOSIS — K639 Disease of intestine, unspecified: Secondary | ICD-10-CM

## 2020-07-21 DIAGNOSIS — E785 Hyperlipidemia, unspecified: Secondary | ICD-10-CM

## 2020-07-21 DIAGNOSIS — M797 Fibromyalgia: Secondary | ICD-10-CM

## 2020-07-21 DIAGNOSIS — J302 Other seasonal allergic rhinitis: Secondary | ICD-10-CM

## 2020-07-21 DIAGNOSIS — K5090 Crohn's disease, unspecified, without complications: Secondary | ICD-10-CM

## 2020-07-21 DIAGNOSIS — I82409 Acute embolism and thrombosis of unspecified deep veins of unspecified lower extremity: Secondary | ICD-10-CM

## 2020-07-21 DIAGNOSIS — K859 Acute pancreatitis without necrosis or infection, unspecified: Secondary | ICD-10-CM

## 2020-07-21 DIAGNOSIS — D4959 Neoplasm of unspecified behavior of other genitourinary organ: Secondary | ICD-10-CM

## 2020-07-21 DIAGNOSIS — G43909 Migraine, unspecified, not intractable, without status migrainosus: Secondary | ICD-10-CM

## 2020-07-21 DIAGNOSIS — I10 Essential (primary) hypertension: Secondary | ICD-10-CM

## 2020-07-21 DIAGNOSIS — N183 CKD (chronic kidney disease) stage 3, GFR 30-59 ml/min (HCC): Secondary | ICD-10-CM

## 2020-07-21 DIAGNOSIS — G40909 Epilepsy, unspecified, not intractable, without status epilepticus: Secondary | ICD-10-CM

## 2020-07-21 DIAGNOSIS — D699 Hemorrhagic condition, unspecified: Secondary | ICD-10-CM

## 2020-07-21 DIAGNOSIS — R42 Dizziness and giddiness: Secondary | ICD-10-CM

## 2020-07-21 DIAGNOSIS — Z789 Other specified health status: Secondary | ICD-10-CM

## 2020-07-21 DIAGNOSIS — K56609 Unspecified intestinal obstruction, unspecified as to partial versus complete obstruction: Secondary | ICD-10-CM

## 2020-07-21 NOTE — Telephone Encounter
Tina Snyder called because they got POC order however since patient also has concentrator from [facility], they explained to patient that ALL O2 has to go through one DME. Pt would like to transfer to [facility] since that is who her PAP is through. They need original qualifying records.     RN contacted [facility] who did original testing to see if the can fax over. Once received it needs to be faxed to [phone].

## 2020-07-24 ENCOUNTER — Encounter: Admit: 2020-07-24 | Discharge: 2020-07-24 | Payer: BC Managed Care – PPO

## 2020-07-24 NOTE — Telephone Encounter
Order to d/c O2 placed as discussed per office note on 07/20/20    DME: [facility] - [place]  Will continue to follow up on order.

## 2020-07-28 ENCOUNTER — Encounter: Admit: 2020-07-28 | Discharge: 2020-07-28 | Payer: BC Managed Care – PPO

## 2020-07-28 NOTE — Telephone Encounter
Received a faxed request for most recent office notes  Printed and faxed to [phone]

## 2020-08-01 ENCOUNTER — Encounter: Admit: 2020-08-01 | Discharge: 2020-08-01 | Payer: BC Managed Care – PPO

## 2020-08-03 ENCOUNTER — Encounter: Admit: 2020-08-03 | Discharge: 2020-08-03 | Payer: BC Managed Care – PPO

## 2020-08-03 DIAGNOSIS — E1165 Type 2 diabetes mellitus with hyperglycemia: Secondary | ICD-10-CM

## 2020-08-03 MED ORDER — BASAGLAR KWIKPEN U-100 INSULIN 100 UNIT/ML (3 ML) SC INPN
Freq: Every day | 1 refills
Start: 2020-08-03 — End: ?

## 2020-08-15 ENCOUNTER — Encounter: Admit: 2020-08-15 | Discharge: 2020-08-15 | Payer: BC Managed Care – PPO

## 2020-08-18 ENCOUNTER — Encounter: Admit: 2020-08-18 | Discharge: 2020-08-18 | Payer: BC Managed Care – PPO

## 2020-08-18 NOTE — Telephone Encounter
Received a request from [facility] for last office note  Patient last seen 01/27/2020 this faxed to [facility]

## 2020-08-19 ENCOUNTER — Encounter: Admit: 2020-08-19 | Discharge: 2020-08-19 | Payer: BC Managed Care – PPO

## 2020-08-22 ENCOUNTER — Encounter: Admit: 2020-08-22 | Discharge: 2020-08-22 | Payer: BC Managed Care – PPO

## 2020-08-22 ENCOUNTER — Ambulatory Visit: Admit: 2020-08-22 | Discharge: 2020-08-22 | Payer: BC Managed Care – PPO

## 2020-08-22 DIAGNOSIS — K7581 Nonalcoholic steatohepatitis (NASH): Secondary | ICD-10-CM

## 2020-08-22 DIAGNOSIS — N183 CKD (chronic kidney disease) stage 3, GFR 30-59 ml/min (HCC): Secondary | ICD-10-CM

## 2020-08-22 DIAGNOSIS — J310 Chronic rhinitis: Secondary | ICD-10-CM

## 2020-08-22 DIAGNOSIS — D699 Hemorrhagic condition, unspecified: Secondary | ICD-10-CM

## 2020-08-22 DIAGNOSIS — K5090 Crohn's disease, unspecified, without complications: Secondary | ICD-10-CM

## 2020-08-22 DIAGNOSIS — I8290 Acute embolism and thrombosis of unspecified vein: Secondary | ICD-10-CM

## 2020-08-22 DIAGNOSIS — K639 Disease of intestine, unspecified: Secondary | ICD-10-CM

## 2020-08-22 DIAGNOSIS — Z789 Other specified health status: Secondary | ICD-10-CM

## 2020-08-22 DIAGNOSIS — A310 Pulmonary mycobacterial infection: Secondary | ICD-10-CM

## 2020-08-22 DIAGNOSIS — Z91018 Allergy to other foods: Secondary | ICD-10-CM

## 2020-08-22 DIAGNOSIS — K219 Gastro-esophageal reflux disease without esophagitis: Secondary | ICD-10-CM

## 2020-08-22 DIAGNOSIS — I82409 Acute embolism and thrombosis of unspecified deep veins of unspecified lower extremity: Secondary | ICD-10-CM

## 2020-08-22 DIAGNOSIS — G40909 Epilepsy, unspecified, not intractable, without status epilepticus: Secondary | ICD-10-CM

## 2020-08-22 DIAGNOSIS — IMO0002 Ulcer: Secondary | ICD-10-CM

## 2020-08-22 DIAGNOSIS — B999 Unspecified infectious disease: Secondary | ICD-10-CM

## 2020-08-22 DIAGNOSIS — E139 Other specified diabetes mellitus without complications: Secondary | ICD-10-CM

## 2020-08-22 DIAGNOSIS — G43909 Migraine, unspecified, not intractable, without status migrainosus: Secondary | ICD-10-CM

## 2020-08-22 DIAGNOSIS — K56609 Unspecified intestinal obstruction, unspecified as to partial versus complete obstruction: Secondary | ICD-10-CM

## 2020-08-22 DIAGNOSIS — D4959 Neoplasm of unspecified behavior of other genitourinary organ: Secondary | ICD-10-CM

## 2020-08-22 DIAGNOSIS — J302 Other seasonal allergic rhinitis: Secondary | ICD-10-CM

## 2020-08-22 DIAGNOSIS — K50918 Crohn's disease, unspecified, with other complication: Secondary | ICD-10-CM

## 2020-08-22 DIAGNOSIS — R42 Dizziness and giddiness: Secondary | ICD-10-CM

## 2020-08-22 DIAGNOSIS — I10 Essential (primary) hypertension: Secondary | ICD-10-CM

## 2020-08-22 DIAGNOSIS — Z8619 Personal history of other infectious and parasitic diseases: Secondary | ICD-10-CM

## 2020-08-22 DIAGNOSIS — M797 Fibromyalgia: Secondary | ICD-10-CM

## 2020-08-22 DIAGNOSIS — L237 Allergic contact dermatitis due to plants, except food: Secondary | ICD-10-CM

## 2020-08-22 DIAGNOSIS — J9611 Chronic respiratory failure with hypoxia: Secondary | ICD-10-CM

## 2020-08-22 DIAGNOSIS — E119 Type 2 diabetes mellitus without complications: Secondary | ICD-10-CM

## 2020-08-22 DIAGNOSIS — K859 Acute pancreatitis without necrosis or infection, unspecified: Secondary | ICD-10-CM

## 2020-08-22 DIAGNOSIS — E785 Hyperlipidemia, unspecified: Secondary | ICD-10-CM

## 2020-08-22 LAB — IMMUNOGLOBULINS-IGA,IGG,IGM
IGA: 372 mg/dL (ref 70–390)
IGG: 769 mg/dL (ref 762–1488)
IGM: 90 mg/dL (ref 38–328)

## 2020-08-22 MED ORDER — AUVI-Q 0.3 MG/0.3 ML IJ ATIN
ORAL | 1 refills | 1.00000 days | Status: AC
Start: 2020-08-22 — End: ?

## 2020-08-22 MED ORDER — TRIAMCINOLONE ACETONIDE 0.1 % TP OINT
Freq: Two times a day (BID) | TOPICAL | 1 refills | Status: AC
Start: 2020-08-22 — End: ?

## 2020-08-22 MED ORDER — MONTELUKAST 10 MG PO TAB
10 mg | ORAL_TABLET | Freq: Every evening | ORAL | 3 refills | 90.00000 days | Status: AC
Start: 2020-08-22 — End: ?

## 2020-08-27 ENCOUNTER — Encounter: Admit: 2020-08-27 | Discharge: 2020-08-27 | Payer: BC Managed Care – PPO

## 2020-08-27 DIAGNOSIS — Z789 Other specified health status: Secondary | ICD-10-CM

## 2020-08-27 DIAGNOSIS — IMO0002 Ulcer: Secondary | ICD-10-CM

## 2020-08-27 DIAGNOSIS — I8290 Acute embolism and thrombosis of unspecified vein: Secondary | ICD-10-CM

## 2020-08-27 DIAGNOSIS — I82409 Acute embolism and thrombosis of unspecified deep veins of unspecified lower extremity: Secondary | ICD-10-CM

## 2020-08-27 DIAGNOSIS — K56609 Unspecified intestinal obstruction, unspecified as to partial versus complete obstruction: Secondary | ICD-10-CM

## 2020-08-27 DIAGNOSIS — E139 Other specified diabetes mellitus without complications: Secondary | ICD-10-CM

## 2020-08-27 DIAGNOSIS — J302 Other seasonal allergic rhinitis: Secondary | ICD-10-CM

## 2020-08-27 DIAGNOSIS — K7581 Nonalcoholic steatohepatitis (NASH): Secondary | ICD-10-CM

## 2020-08-27 DIAGNOSIS — K639 Disease of intestine, unspecified: Secondary | ICD-10-CM

## 2020-08-27 DIAGNOSIS — K219 Gastro-esophageal reflux disease without esophagitis: Secondary | ICD-10-CM

## 2020-08-27 DIAGNOSIS — K859 Acute pancreatitis without necrosis or infection, unspecified: Secondary | ICD-10-CM

## 2020-08-27 DIAGNOSIS — K5090 Crohn's disease, unspecified, without complications: Secondary | ICD-10-CM

## 2020-08-27 DIAGNOSIS — D699 Hemorrhagic condition, unspecified: Secondary | ICD-10-CM

## 2020-08-27 DIAGNOSIS — R42 Dizziness and giddiness: Secondary | ICD-10-CM

## 2020-08-27 DIAGNOSIS — I10 Essential (primary) hypertension: Secondary | ICD-10-CM

## 2020-08-27 DIAGNOSIS — E785 Hyperlipidemia, unspecified: Secondary | ICD-10-CM

## 2020-08-27 DIAGNOSIS — E119 Type 2 diabetes mellitus without complications: Secondary | ICD-10-CM

## 2020-08-27 DIAGNOSIS — N183 CKD (chronic kidney disease) stage 3, GFR 30-59 ml/min (HCC): Secondary | ICD-10-CM

## 2020-08-27 DIAGNOSIS — M797 Fibromyalgia: Secondary | ICD-10-CM

## 2020-08-27 DIAGNOSIS — Z8619 Personal history of other infectious and parasitic diseases: Secondary | ICD-10-CM

## 2020-08-27 DIAGNOSIS — G43909 Migraine, unspecified, not intractable, without status migrainosus: Secondary | ICD-10-CM

## 2020-08-27 DIAGNOSIS — G40909 Epilepsy, unspecified, not intractable, without status epilepticus: Secondary | ICD-10-CM

## 2020-08-27 DIAGNOSIS — D4959 Neoplasm of unspecified behavior of other genitourinary organ: Secondary | ICD-10-CM

## 2020-08-30 ENCOUNTER — Ambulatory Visit: Admit: 2020-08-30 | Discharge: 2020-08-31 | Payer: BC Managed Care – PPO

## 2020-08-30 ENCOUNTER — Encounter: Admit: 2020-08-30 | Discharge: 2020-08-30 | Payer: BC Managed Care – PPO

## 2020-08-30 DIAGNOSIS — K7581 Nonalcoholic steatohepatitis (NASH): Secondary | ICD-10-CM

## 2020-08-30 DIAGNOSIS — R42 Dizziness and giddiness: Secondary | ICD-10-CM

## 2020-08-30 DIAGNOSIS — K56609 Unspecified intestinal obstruction, unspecified as to partial versus complete obstruction: Secondary | ICD-10-CM

## 2020-08-30 DIAGNOSIS — K5090 Crohn's disease, unspecified, without complications: Secondary | ICD-10-CM

## 2020-08-30 DIAGNOSIS — I82409 Acute embolism and thrombosis of unspecified deep veins of unspecified lower extremity: Secondary | ICD-10-CM

## 2020-08-30 DIAGNOSIS — K859 Acute pancreatitis without necrosis or infection, unspecified: Secondary | ICD-10-CM

## 2020-08-30 DIAGNOSIS — K639 Disease of intestine, unspecified: Secondary | ICD-10-CM

## 2020-08-30 DIAGNOSIS — D4959 Neoplasm of unspecified behavior of other genitourinary organ: Secondary | ICD-10-CM

## 2020-08-30 DIAGNOSIS — J302 Other seasonal allergic rhinitis: Secondary | ICD-10-CM

## 2020-08-30 DIAGNOSIS — M797 Fibromyalgia: Secondary | ICD-10-CM

## 2020-08-30 DIAGNOSIS — G40909 Epilepsy, unspecified, not intractable, without status epilepticus: Secondary | ICD-10-CM

## 2020-08-30 DIAGNOSIS — Z8619 Personal history of other infectious and parasitic diseases: Secondary | ICD-10-CM

## 2020-08-30 DIAGNOSIS — IMO0002 Ulcer: Secondary | ICD-10-CM

## 2020-08-30 DIAGNOSIS — I10 Essential (primary) hypertension: Secondary | ICD-10-CM

## 2020-08-30 DIAGNOSIS — G43909 Migraine, unspecified, not intractable, without status migrainosus: Secondary | ICD-10-CM

## 2020-08-30 DIAGNOSIS — E119 Type 2 diabetes mellitus without complications: Secondary | ICD-10-CM

## 2020-08-30 DIAGNOSIS — E785 Hyperlipidemia, unspecified: Secondary | ICD-10-CM

## 2020-08-30 DIAGNOSIS — D699 Hemorrhagic condition, unspecified: Secondary | ICD-10-CM

## 2020-08-30 DIAGNOSIS — K219 Gastro-esophageal reflux disease without esophagitis: Secondary | ICD-10-CM

## 2020-08-30 DIAGNOSIS — Z789 Other specified health status: Secondary | ICD-10-CM

## 2020-08-30 DIAGNOSIS — I8290 Acute embolism and thrombosis of unspecified vein: Secondary | ICD-10-CM

## 2020-08-30 DIAGNOSIS — N183 CKD (chronic kidney disease) stage 3, GFR 30-59 ml/min (HCC): Secondary | ICD-10-CM

## 2020-08-30 DIAGNOSIS — E139 Other specified diabetes mellitus without complications: Secondary | ICD-10-CM

## 2020-08-30 MED ORDER — DIPHENHYDRAMINE HCL 50 MG/ML IJ SOLN
25-50 mg | Freq: Once | INTRAVENOUS | 0 refills | Status: CP
Start: 2020-08-30 — End: ?
  Administered 2020-08-30: 19:00:00 25 mg via INTRAVENOUS

## 2020-08-30 MED ORDER — ACETAMINOPHEN 500 MG PO TAB
500 mg | Freq: Once | ORAL | 0 refills
Start: 2020-08-30 — End: ?

## 2020-08-30 MED ORDER — DIPHENHYDRAMINE HCL 50 MG/ML IJ SOLN
25-50 mg | Freq: Once | INTRAVENOUS | 0 refills
Start: 2020-08-30 — End: ?

## 2020-08-30 MED ORDER — VEDOLIZUMAB IVPB
300 mg | Freq: Once | INTRAVENOUS | 0 refills | Status: CP
Start: 2020-08-30 — End: ?
  Administered 2020-08-30 (×2): 300 mg via INTRAVENOUS

## 2020-08-30 MED ORDER — ACETAMINOPHEN 500 MG PO TAB
500 mg | Freq: Once | ORAL | 0 refills | Status: CP
Start: 2020-08-30 — End: ?
  Administered 2020-08-30: 19:00:00 500 mg via ORAL

## 2020-08-30 MED ORDER — VEDOLIZUMAB IVPB
300 mg | Freq: Once | INTRAVENOUS | 0 refills
Start: 2020-08-30 — End: ?

## 2020-08-30 MED ADMIN — DIPHENHYDRAMINE HCL 50 MG/ML IJ SOLN [2508]: 25 mg | INTRAVENOUS | @ 20:00:00 | Stop: 2020-08-30 | NDC 63323066400

## 2020-08-30 NOTE — Progress Notes
Patient tolerated infusion with one episode of itching toward end of infusion. Second dose IV benadryl given 25 mg (see MAR) with resolution of itching.

## 2020-08-31 DIAGNOSIS — K50918 Crohn's disease, unspecified, with other complication: Secondary | ICD-10-CM

## 2020-09-03 ENCOUNTER — Encounter: Admit: 2020-09-03 | Discharge: 2020-09-03 | Payer: BC Managed Care – PPO

## 2020-09-03 DIAGNOSIS — K5080 Crohn's disease of both small and large intestine without complications: Secondary | ICD-10-CM

## 2020-09-03 MED ORDER — TRAMADOL 50 MG PO TAB
ORAL_TABLET | Freq: Four times a day (QID) | 0 refills | PRN
Start: 2020-09-03 — End: ?

## 2020-09-05 ENCOUNTER — Encounter: Admit: 2020-09-05 | Discharge: 2020-09-05 | Payer: BC Managed Care – PPO

## 2020-09-05 ENCOUNTER — Ambulatory Visit: Admit: 2020-09-05 | Discharge: 2020-09-06 | Payer: BC Managed Care – PPO

## 2020-09-05 DIAGNOSIS — E119 Type 2 diabetes mellitus without complications: Secondary | ICD-10-CM

## 2020-09-05 DIAGNOSIS — R42 Dizziness and giddiness: Secondary | ICD-10-CM

## 2020-09-05 DIAGNOSIS — K7581 Nonalcoholic steatohepatitis (NASH): Secondary | ICD-10-CM

## 2020-09-05 DIAGNOSIS — D699 Hemorrhagic condition, unspecified: Secondary | ICD-10-CM

## 2020-09-05 DIAGNOSIS — IMO0002 Ulcer: Secondary | ICD-10-CM

## 2020-09-05 DIAGNOSIS — K639 Disease of intestine, unspecified: Secondary | ICD-10-CM

## 2020-09-05 DIAGNOSIS — Z8619 Personal history of other infectious and parasitic diseases: Secondary | ICD-10-CM

## 2020-09-05 DIAGNOSIS — K56609 Unspecified intestinal obstruction, unspecified as to partial versus complete obstruction: Secondary | ICD-10-CM

## 2020-09-05 DIAGNOSIS — K219 Gastro-esophageal reflux disease without esophagitis: Secondary | ICD-10-CM

## 2020-09-05 DIAGNOSIS — G40909 Epilepsy, unspecified, not intractable, without status epilepticus: Secondary | ICD-10-CM

## 2020-09-05 DIAGNOSIS — F331 Major depressive disorder, recurrent, moderate: Secondary | ICD-10-CM

## 2020-09-05 DIAGNOSIS — N183 CKD (chronic kidney disease) stage 3, GFR 30-59 ml/min (HCC): Secondary | ICD-10-CM

## 2020-09-05 DIAGNOSIS — M797 Fibromyalgia: Secondary | ICD-10-CM

## 2020-09-05 DIAGNOSIS — K50918 Crohn's disease, unspecified, with other complication: Secondary | ICD-10-CM

## 2020-09-05 DIAGNOSIS — J302 Other seasonal allergic rhinitis: Secondary | ICD-10-CM

## 2020-09-05 DIAGNOSIS — I10 Essential (primary) hypertension: Secondary | ICD-10-CM

## 2020-09-05 DIAGNOSIS — E139 Other specified diabetes mellitus without complications: Secondary | ICD-10-CM

## 2020-09-05 DIAGNOSIS — K5090 Crohn's disease, unspecified, without complications: Secondary | ICD-10-CM

## 2020-09-05 DIAGNOSIS — I8290 Acute embolism and thrombosis of unspecified vein: Secondary | ICD-10-CM

## 2020-09-05 DIAGNOSIS — R4589 Other symptoms and signs involving emotional state: Secondary | ICD-10-CM

## 2020-09-05 DIAGNOSIS — G43909 Migraine, unspecified, not intractable, without status migrainosus: Secondary | ICD-10-CM

## 2020-09-05 DIAGNOSIS — K859 Acute pancreatitis without necrosis or infection, unspecified: Secondary | ICD-10-CM

## 2020-09-05 DIAGNOSIS — E785 Hyperlipidemia, unspecified: Secondary | ICD-10-CM

## 2020-09-05 DIAGNOSIS — D4959 Neoplasm of unspecified behavior of other genitourinary organ: Secondary | ICD-10-CM

## 2020-09-05 DIAGNOSIS — Z789 Other specified health status: Secondary | ICD-10-CM

## 2020-09-05 DIAGNOSIS — I82409 Acute embolism and thrombosis of unspecified deep veins of unspecified lower extremity: Secondary | ICD-10-CM

## 2020-09-05 MED ORDER — DULOXETINE 30 MG PO CPDR
30 mg | ORAL_CAPSULE | Freq: Every day | ORAL | 0 refills | 60.00000 days | Status: AC
Start: 2020-09-05 — End: ?

## 2020-09-05 MED ORDER — AMITRIPTYLINE 10 MG PO TAB
10 mg | ORAL_TABLET | Freq: Every evening | ORAL | 3 refills | Status: AC
Start: 2020-09-05 — End: ?

## 2020-09-05 NOTE — Patient Instructions
Decease cymbalta to 11m for 2 weeks, then consider stopping or continue for a month  Start elavil 116min the evening, OK to increase to 20 at  [facility] Continue wellbutrin in the AM.

## 2020-09-06 ENCOUNTER — Encounter: Admit: 2020-09-06 | Discharge: 2020-09-06 | Payer: BC Managed Care – PPO

## 2020-09-06 DIAGNOSIS — G40909 Epilepsy, unspecified, not intractable, without status epilepticus: Secondary | ICD-10-CM

## 2020-09-06 DIAGNOSIS — D4959 Neoplasm of unspecified behavior of other genitourinary organ: Secondary | ICD-10-CM

## 2020-09-06 DIAGNOSIS — E1122 Type 2 diabetes mellitus with diabetic chronic kidney disease: Secondary | ICD-10-CM

## 2020-09-06 DIAGNOSIS — K5090 Crohn's disease, unspecified, without complications: Secondary | ICD-10-CM

## 2020-09-06 DIAGNOSIS — R42 Dizziness and giddiness: Secondary | ICD-10-CM

## 2020-09-06 DIAGNOSIS — M797 Fibromyalgia: Secondary | ICD-10-CM

## 2020-09-06 DIAGNOSIS — K219 Gastro-esophageal reflux disease without esophagitis: Secondary | ICD-10-CM

## 2020-09-06 DIAGNOSIS — N1831 Chronic kidney disease, stage 3a (HCC): Principal | ICD-10-CM

## 2020-09-06 DIAGNOSIS — K56609 Unspecified intestinal obstruction, unspecified as to partial versus complete obstruction: Secondary | ICD-10-CM

## 2020-09-06 DIAGNOSIS — I8290 Acute embolism and thrombosis of unspecified vein: Secondary | ICD-10-CM

## 2020-09-06 DIAGNOSIS — Z794 Long term (current) use of insulin: Secondary | ICD-10-CM

## 2020-09-06 DIAGNOSIS — K859 Acute pancreatitis without necrosis or infection, unspecified: Secondary | ICD-10-CM

## 2020-09-06 DIAGNOSIS — N183 CKD (chronic kidney disease) stage 3, GFR 30-59 ml/min (HCC): Secondary | ICD-10-CM

## 2020-09-06 DIAGNOSIS — E119 Type 2 diabetes mellitus without complications: Secondary | ICD-10-CM

## 2020-09-06 DIAGNOSIS — D699 Hemorrhagic condition, unspecified: Secondary | ICD-10-CM

## 2020-09-06 DIAGNOSIS — J302 Other seasonal allergic rhinitis: Secondary | ICD-10-CM

## 2020-09-06 DIAGNOSIS — G43909 Migraine, unspecified, not intractable, without status migrainosus: Secondary | ICD-10-CM

## 2020-09-06 DIAGNOSIS — Z789 Other specified health status: Secondary | ICD-10-CM

## 2020-09-06 DIAGNOSIS — Z8619 Personal history of other infectious and parasitic diseases: Secondary | ICD-10-CM

## 2020-09-06 DIAGNOSIS — E139 Other specified diabetes mellitus without complications: Secondary | ICD-10-CM

## 2020-09-06 DIAGNOSIS — IMO0002 Ulcer: Secondary | ICD-10-CM

## 2020-09-06 DIAGNOSIS — K639 Disease of intestine, unspecified: Secondary | ICD-10-CM

## 2020-09-06 DIAGNOSIS — I10 Essential (primary) hypertension: Secondary | ICD-10-CM

## 2020-09-06 DIAGNOSIS — E785 Hyperlipidemia, unspecified: Secondary | ICD-10-CM

## 2020-09-06 DIAGNOSIS — I82409 Acute embolism and thrombosis of unspecified deep veins of unspecified lower extremity: Secondary | ICD-10-CM

## 2020-09-06 DIAGNOSIS — K7581 Nonalcoholic steatohepatitis (NASH): Secondary | ICD-10-CM

## 2020-09-11 ENCOUNTER — Encounter: Admit: 2020-09-11 | Discharge: 2020-09-11 | Payer: BC Managed Care – PPO

## 2020-09-11 DIAGNOSIS — M79641 Pain in right hand: Secondary | ICD-10-CM

## 2020-09-11 MED ORDER — DICLOFENAC SODIUM 1 % TP GEL
4 g | Freq: Four times a day (QID) | TOPICAL | 3 refills | 19.00000 days | Status: AC
Start: 2020-09-11 — End: ?

## 2020-09-13 ENCOUNTER — Encounter: Admit: 2020-09-13 | Discharge: 2020-09-13 | Payer: BC Managed Care – PPO

## 2020-09-13 DIAGNOSIS — Z23 Encounter for immunization: Secondary | ICD-10-CM

## 2020-09-13 MED ORDER — PNEUMOCOCCAL 23-VAL PS VACCINE 25 MCG/0.5 ML IJ SOLN
0.5 mL | Freq: Once | INTRAMUSCULAR | 0 refills | Status: AC
Start: 2020-09-13 — End: ?
  Administered 2020-09-19: 15:00:00 0.5 mL via INTRAMUSCULAR

## 2020-09-13 NOTE — Telephone Encounter
Called the patient to confirm results.  Her flow cytometry was normal.  Antibody levels were normal as well as her CH 50 and MBL function.  Her tetanus antibody level was protective currently at 6 out of 23 serotypes of pneumococcus that were protective at 1.3 or greater but her last vaccine was 02/26/2011, 6 months of driving.  We also discussed her IgE to walnut and pecan were both negative.  She prefers to avoid the food and not move forward with an oral challenge.  She did receive her Auvi-Q.    We discussed options for her immune system evaluation and since she has not had any pneumococcal vaccines for about 10 years, it would be reasonable for her to get that.  Currently, guidelines would suggest that she gets Prevnar 20.  However, since we know she responds to protein-based vaccines with her normal tetanus response, it would be nice to know if she also responds to pneumococcal vaccines to assess a polysaccharide based vaccine response.  We discussed both options and she elected to proceed with Pneumovax, which she will get next Tuesday here in clinic.  4 weeks following that, she will come for a pneumococcal antibody level.  If she does not have response, will consider Prevnar 20 at that time.  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    She also states her husband tested positive for Covid and she is still negative. She has a swollen throat and she has talked to her PCP. She will retest on Sunday or if her throat gets worse. She has test kits at home. She will test on Friday since it is a holiday weekend and has some symptoms.

## 2020-09-14 ENCOUNTER — Encounter: Admit: 2020-09-14 | Discharge: 2020-09-14 | Payer: BC Managed Care – PPO

## 2020-09-14 NOTE — Telephone Encounter
Received a records request from [facility] for past 6 months  Printed and faxed to [phone]

## 2020-09-14 NOTE — Telephone Encounter
Called patient to review.  She placed her Dexcom 3 to 4 days ago.  She was having some irritation associated with its placement so she took it off last night.  After removing her Dexcom she noted an area of erythema which also had some drainage.  Last night she cleaned the drainage and placed some topical Neosporin over it.  This morning she drained it again cleaned her wound and placed more Neosporin on it.  She is unsure if the Neosporin is helping.    Would like to give the Neosporin some time to work.  Reviewed the importance of keeping area clean, dry.  Patient instructed to reach out to clinic tomorrow if erythema persists or drainage has not decreased  We will plan oral antibiotics for her to start over the weekend if symptoms persist.  Also reviewed that if she feels like symptoms are drastically worsening she should go to urgent care so that she can be physically examined.

## 2020-09-14 NOTE — Telephone Encounter
Received a records request from [facility] for last office note   Printed and faxed with a note Patient has an appointment 09/19/2020, to [phone]

## 2020-09-14 NOTE — Telephone Encounter
-----   Message from Hallam, LPN sent at 0/96/4383  8:49 AM CDT -----  Regarding: FW: Belly sore  HELP, Please  Mariann Laster   ----- Message -----  From: Les Pou  Sent: 09/14/2020   8:36 AM CDT  To: Ahmed Prima Nurse [facility]  Subject: Belly sore                                       I am attaching a picture of a sore on my belly from my dexcom sensor. I squeezed a good amount of pus out of it this morning and last night too. I have been putting on ointment but doesnt seem to be helping.   I havent had one of these in over a year or more. Last one I had I ended up in [place] ER with it cut open and packed with gauze.   I dont think I could make it to Tuesday, when I see you, so sent it today.

## 2020-09-19 ENCOUNTER — Encounter: Admit: 2020-09-19 | Discharge: 2020-09-19 | Payer: BC Managed Care – PPO

## 2020-09-19 ENCOUNTER — Ambulatory Visit: Admit: 2020-09-19 | Discharge: 2020-09-19 | Payer: MEDICARE

## 2020-09-19 ENCOUNTER — Ambulatory Visit: Admit: 2020-09-19 | Discharge: 2020-09-19 | Payer: BC Managed Care – PPO

## 2020-09-19 ENCOUNTER — Ambulatory Visit: Admit: 2020-09-19 | Discharge: 2020-09-20 | Payer: BC Managed Care – PPO

## 2020-09-19 DIAGNOSIS — K639 Disease of intestine, unspecified: Secondary | ICD-10-CM

## 2020-09-19 DIAGNOSIS — E139 Other specified diabetes mellitus without complications: Secondary | ICD-10-CM

## 2020-09-19 DIAGNOSIS — G40909 Epilepsy, unspecified, not intractable, without status epilepticus: Secondary | ICD-10-CM

## 2020-09-19 DIAGNOSIS — Z789 Other specified health status: Secondary | ICD-10-CM

## 2020-09-19 DIAGNOSIS — IMO0002 Ulcer: Secondary | ICD-10-CM

## 2020-09-19 DIAGNOSIS — K5090 Crohn's disease, unspecified, without complications: Secondary | ICD-10-CM

## 2020-09-19 DIAGNOSIS — I82409 Acute embolism and thrombosis of unspecified deep veins of unspecified lower extremity: Secondary | ICD-10-CM

## 2020-09-19 DIAGNOSIS — I10 Essential (primary) hypertension: Secondary | ICD-10-CM

## 2020-09-19 DIAGNOSIS — E1165 Type 2 diabetes mellitus with hyperglycemia: Secondary | ICD-10-CM

## 2020-09-19 DIAGNOSIS — G43909 Migraine, unspecified, not intractable, without status migrainosus: Secondary | ICD-10-CM

## 2020-09-19 DIAGNOSIS — N183 CKD (chronic kidney disease) stage 3, GFR 30-59 ml/min (HCC): Secondary | ICD-10-CM

## 2020-09-19 DIAGNOSIS — K56609 Unspecified intestinal obstruction, unspecified as to partial versus complete obstruction: Secondary | ICD-10-CM

## 2020-09-19 DIAGNOSIS — M797 Fibromyalgia: Secondary | ICD-10-CM

## 2020-09-19 DIAGNOSIS — D699 Hemorrhagic condition, unspecified: Secondary | ICD-10-CM

## 2020-09-19 DIAGNOSIS — K7581 Nonalcoholic steatohepatitis (NASH): Secondary | ICD-10-CM

## 2020-09-19 DIAGNOSIS — E785 Hyperlipidemia, unspecified: Secondary | ICD-10-CM

## 2020-09-19 DIAGNOSIS — E119 Type 2 diabetes mellitus without complications: Secondary | ICD-10-CM

## 2020-09-19 DIAGNOSIS — D4959 Neoplasm of unspecified behavior of other genitourinary organ: Secondary | ICD-10-CM

## 2020-09-19 DIAGNOSIS — I8290 Acute embolism and thrombosis of unspecified vein: Secondary | ICD-10-CM

## 2020-09-19 DIAGNOSIS — Z8619 Personal history of other infectious and parasitic diseases: Secondary | ICD-10-CM

## 2020-09-19 DIAGNOSIS — Z23 Encounter for immunization: Secondary | ICD-10-CM

## 2020-09-19 DIAGNOSIS — R42 Dizziness and giddiness: Secondary | ICD-10-CM

## 2020-09-19 DIAGNOSIS — K219 Gastro-esophageal reflux disease without esophagitis: Secondary | ICD-10-CM

## 2020-09-19 DIAGNOSIS — J302 Other seasonal allergic rhinitis: Secondary | ICD-10-CM

## 2020-09-19 DIAGNOSIS — K859 Acute pancreatitis without necrosis or infection, unspecified: Secondary | ICD-10-CM

## 2020-09-19 LAB — LIPID PROFILE
CHOLESTEROL: 119 mg/dL — ABNORMAL HIGH (ref <200)
LDL: 66 mg/dL (ref <100)
NON HDL CHOLESTEROL: 89 mg/dL
TRIGLYCERIDES: 173 mg/dL — ABNORMAL HIGH (ref <150)

## 2020-09-19 LAB — VITAMIN B12: VITAMIN B12: 325 pg/mL — ABNORMAL LOW (ref >40)

## 2020-09-19 LAB — MICROALB/CR RATIO-URINE RANDOM
MICROALBUMIN, RAN: 64 ug/mL
UR CREATININE, RAN: 210 mg/dL

## 2020-09-19 LAB — TSH WITH FREE T4 REFLEX: TSH: 3.6 uU/mL (ref 0.35–5.00)

## 2020-09-19 MED ORDER — PNEUMOCOCCAL 23-VAL PS VACCINE 25 MCG/0.5 ML IJ SOLN
0.5 mL | Freq: Once | INTRAMUSCULAR | 0 refills | Status: AC
Start: 2020-09-19 — End: ?

## 2020-09-19 MED ORDER — INSULIN ASPART 100 UNIT/ML SC FLEXPEN
3 refills | 30.00000 days | Status: AC
Start: 2020-09-19 — End: ?

## 2020-09-19 MED ORDER — DAPAGLIFLOZIN 5 MG PO TAB
5 mg | ORAL_TABLET | Freq: Every day | ORAL | 3 refills | Status: AC
Start: 2020-09-19 — End: ?

## 2020-09-19 NOTE — Progress Notes
Pt presents today for pneumonia vaccine. Consent signed and placed in chart. See MAR for more details.

## 2020-09-19 NOTE — Progress Notes
Subjective:       Diabetes      Tina Snyder is a 55 y.o. female who was seen today as a follow up for type 2 DM. She was admitted in 11/2019 due to SBO and was also found to have pancreatitis. She was previously on U500 insulin, but following her admission with lower insulin requirements and poor PO intake, she was switched to Lantus and Novolog.     Interval changes:   She is now wearing oxygen 2 L during the day and 3 L at night. She was started on oxygen after desaturating during a colonoscopy last year.   Overall, she has been doing well with blood sugar control, occasionally with hyperglycemia after higher carb foods or drinks.   ?  Summary of Glucose Control:??  Lab Results   Component Value Date    A1C 6.4 (A) 09/05/2020    GLUPOC 157 (H) 03/01/2020       Dexcom download:      -----------------------------  Tina Snyder  Date of Birth: 04/16/1966  Generated at: Tue, Sep 19, 2020 3:09 PM CDT  Reporting period: Mon Aug 21, 2020 - Tue Sep 19, 2020  -----------------------------  Glucose Details  Average glucose: 160 mg/dL  Standard deviation: 43 mg/dL  GMI: 4.5%  -----------------------------  Time in Range  Very High: 2%  High: 26%  In Range: 71%  Low: <1%  Very Low: 0%    Target Range  70-180 mg/dL    -----------------------------  CGM Details  Sensor usage: 100%  Days with CGM data: 30/30    Trends:   - Time in range is at goal with minimal hypoglycemia   - Hyperglycemia the past few days (attributes this to drinking lemonade)  ?  Type 2 DM:   Dx: 2012  Current regimen:  Lantus 30 units daily, Novolog 25 units with meals   Frequency of hypoglycemia infrequent  ? How many meals per day??3 meals with snack ?  Weight changes:   Exercise? no   Lipid: lipitor 40 mg daily      Family History of diabetes: none, son with celiac  Social History: lives with her husband, Tina Snyder, in [place]. Does not drink or smoke.    ?  Medical History:   Diagnosis Date   ? Bleeding tendency (HCC)    ? 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 Bowel disease    ? CKD (chronic kidney disease) stage 3, GFR 30-59 ml/min (HCC)    ? Crohn disease (HCC)    ? Deep vein thrombosis (DVT) (HCC)    ? Difficult intravenous access    ? Dizziness    ? DM (diabetes mellitus), secondary-steroid-induced 2012   ? Fibromyalgia    ? GERD (gastroesophageal reflux disease)    ? H/O Mycobacterium avium intracellulare infection    ? Hyperlipidemia    ? Hypertension    ? Hypertensive heart disease    ? Migraines    ? Pancreatitis    ? Seasonal allergic reaction    ? Seizure disorder (HCC)    ? Small bowel obstruction (HCC)    ? Steatohepatitis     felt secondary to medications   ? Type 2 diabetes mellitus without complication, with long-term current use of insulin (HCC) 10/22/2017   ? Ulcer    ? Vaginal tumor          Review of Systems  Positive for stomach pain, shooting pains in feet/legs.  Objective:         ? amitriptyline (ELAVIL) 10 mg tablet Take one tablet by mouth at bedtime daily.   ? atorvastatin (LIPITOR) 40 mg tablet TAKE 1 TABLET BY MOUTH EVERY DAY   ? AUVI-Q 0.3 mg/0.3 mL auto-injector Inject 0.3 mg (1 Pen) into thigh if needed for anaphylactic reaction. May repeat in 5-15 minutes if needed.   ? BASAGLAR KWIKPEN U-100 INSULIN 100 unit/mL (3 mL) subcutaneous PEN INJECT THIRTY UNITS UNDER THE SKIN DAILY   ? Blood-Glucose Sensor (DEXCOM G6 SENSOR) devi Use 1 each as directed as directed. Change every 10 days   ? budesonide (ENTOCORT EC) 3 mg capsule TAKE 2 CAPSULES BY MOUTH DAILY.   ? buPROPion XL (WELLBUTRIN XL) 300 mg tablet Take one tablet by mouth every morning. Do not crush or chew.   ? DEXCOM G6 RECEIVER misc Use 1 each as directed daily.   ? DEXCOM G6 TRANSMITTER devi Use 1 each as directed as directed. Every 90 days   ? dexlansoprazole (DEXILANT) 60 mg capsule Take one capsule by mouth daily.   ? diclofenac sodium (VOLTAREN) 1 % topical gel Apply four g topically to affected area four times daily.   ? duloxetine DR (CYMBALTA) 30 mg capsule Take one capsule by mouth daily.   ? famotidine (PEPCID) 20 mg tablet Take one tablet by mouth at bedtime daily.   ? glucagon (BAQSIMI) 3 mg/actuation nasal spray Use 1 spray in single nostril. If no response, may repeat in 15 minutes using second device.   ? icosapent ethyL (VASCEPA) 1 gram capsule Take two capsules by mouth twice daily with meals.   ? insulin aspart (U-100) (NOVOLOG FLEXPEN U-100 INSULIN) 100 unit/mL (3 mL) injection PEN Inject 20 units with breakfast, 25 units with lunch and dinner, 8 units with snacks. Add in correction scale based on pre meal blood sugar. Max dose 125 U per day.   ? insulin pen needles (disposable) (B-D NANO) 32 gauge x 5/32 pen needle Use one each as directed before meals and at bedtime. Use with insulin injections.   ? montelukast (SINGULAIR) 10 mg tablet Take one tablet by mouth at bedtime daily.   ? nebivolol (BYSTOLIC) 10 mg tablet Take one tablet by mouth daily.   ? ondansetron HCL (ZOFRAN) 4 mg tablet Take one tablet by mouth every 8 hours.   ? pantoprazole DR (PROTONIX) 40 mg tablet Take one tablet by mouth daily.   ? polyethylene glycol 3350 (MIRALAX) 17 gram/dose powder Take seventeen g by mouth twice daily.   ? promethazine (PHENERGAN) 25 mg tablet Take one tablet by mouth every 6 hours as needed for Nausea or Vomiting.   ? triamcinolone acetonide (KENALOG) 0.1 % topical ointment Apply  topically to affected area twice daily.   ? vedolizumab (ENTYVIO IV) Administer  through vein every 8 weeks.      Telehealth Patient Reported Vitals     Row Name 09/19/20 0832                Pain Score SIX        Pain Loc --  Stomach Cohrns                  Vitals:    09/19/20 0832   BP: (!) 168/92   Pulse: 75   Temp: 36.7 ?C (98 ?F)         Physical Exam  Vitals and nursing note reviewed.   Constitutional:  General: She is not in acute distress.     Appearance: Normal appearance. She is not ill-appearing.   HENT:      Head: Normocephalic and atraumatic.      Nose: Nose normal. Eyes:      Conjunctiva/sclera: Conjunctivae normal.   Neurological:      Mental Status: She is alert and oriented to person, place, and time.   Psychiatric:         Mood and Affect: Mood normal.         Behavior: Behavior normal.         Thought Content: Thought content normal.         Judgment: Judgment normal.       Labs:   Comprehensive Metabolic Profile    Lab Results   Component Value Date/Time    NA 139 08/30/2020 02:00 PM    K 5.0 08/30/2020 02:00 PM    CL 101 08/30/2020 02:00 PM    CO2 27 08/30/2020 02:00 PM    GAP 11 08/30/2020 02:00 PM    BUN 15 08/30/2020 02:00 PM    CR 1.24 (H) 08/30/2020 02:00 PM    GLU 103 (H) 08/30/2020 02:00 PM    Lab Results   Component Value Date/Time    CA 9.6 08/30/2020 02:00 PM    PO4 3.8 12/16/2019 06:01 AM    ALBUMIN 4.3 08/30/2020 02:00 PM    TOTPROT 6.7 08/30/2020 02:00 PM    ALKPHOS 177 (H) 08/30/2020 02:00 PM    AST 30 08/30/2020 02:00 PM    ALT 18 08/30/2020 02:00 PM    TOTBILI 0.5 08/30/2020 02:00 PM    GFR 43 (L) 03/23/2020 01:04 PM    GFRAA 52 (L) 03/23/2020 01:04 PM        No results found for: MCALB24   Microalbumin/CR ratio Urine   Date Value Ref Range Status   09/19/2020 30.81 (H) <30 ug/mg Final     Comment:     NOTE NEW REFERENCE RANGES     Microalbumin, Random   Date Value Ref Range Status   09/19/2020 64.7 MCG/ML Final     Lab Results   Component Value Date    CHOL 119 09/19/2020    TRIG 173 (H) 09/19/2020    HDL 30 (L) 09/19/2020    LDL 66 09/19/2020    VLDL 35 09/19/2020    NONHDLCHOL 89 09/19/2020      TSH   Date Value Ref Range Status   09/19/2020 3.67 0.35 - 5.00 MCU/ML Final       Hemoglobin A1C   Date Value Ref Range Status   10/14/2019 6.8 (H) 4.0 - 6.0 % Final     Comment:     The ADA recommends that most patients with type 1 and type 2 diabetes maintain   an A1c level <7%.     11/16/2018 9.1 (H) 4.0 - 6.0 % Final     Comment:     The ADA recommends that most patients with type 1 and type 2 diabetes maintain   an A1c level <7%.     10/24/2015 9.3 (H) 4.0 - 6.0 % Final     Comment:     The ADA recommends that most patients with type 1 and type 2 diabetes maintain   an A1c level <7%.       Poc Hemoglobin A1C   Date Value Ref Range Status   09/05/2020 6.4 (A) 4 - 6 % Final   03/26/2019 7.5 (  H) 4 - 6 % Final   03/27/2018 9.2 % Final     Lab Results   Component Value Date    PLTCT 283 08/30/2020          Assessment and Plan:  Diabetes mellitus type 2,  controlled  A1C 6.4%, time in range on CGM at goal   ? Target A1c <7% without significant hypoglycemia  ? Complicated by: Nephropathy, poorly healing wounds, pancreatitis   ?   ?  Plan:  ? Start SGLT2 (Farxiga 5 mg). We had previously avoided this with frequent yeast infections, but she has not had one in > 1 year, so we opted to start this. Discussed CV and renal benefits. Discussed risks and benefits. Discussed the importance of hydration.   ? Continue lantus 30 U daily   ? Discussed patient's BMI with her.  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    ? Will continue to avoid GLP1 due to hx of pancreatitis.   ? Discussed rule of 15 and how to tx hypoglycemia  ? Reminded pt to rotate insulin sites    Diabetic complications - prevention and management:  - Annual labs (electrolytes and renal function): Last done 08/2020  - Annual eye exam: Last done 04/2018.  Retinopathy - none, encouraged to reschedule  - Hyperlipidemia: hypertriglyceridemia. Lipid panel: Last done 08/2020         - Statin: yes         - Discussed dietary modifications (low cholesterol)  - Nephropathy: yes, CKD 3. Annual Urine microalbumin/Cr: 08/2020, slightly elevated.   - Foot exam / monofilament exam (recommended annually): Last done today         - Discussed foot care. Check B12 due to shooting pains. Discussed back issue/sciatica as possible causes as well.   - Diabetic Educator and/or nutritionist visit (recommended annually): scheduled with Pattie Leuyot RD [facility]     RTC in 2 weeks with Pattie, 2 months with Dr. Lajoyce Corners, 4 months with me.     Patient Instructions   Our plan:   - Start farxiga 5 mg in the mornings. This pill makes you pee out sugar. Let me know if you develop symptoms of a yeast infection.     Reduce Novolog to 20 units plus 2 units for every 50 over 150.     Continue lantus 30 units daily       Syreeta Figler Antonieta Loveless, PA-C                             45 minutes spent on this patient's encounter with counseling and coordination of care taking >50% of the visit.

## 2020-09-20 DIAGNOSIS — Z794 Long term (current) use of insulin: Secondary | ICD-10-CM

## 2020-09-21 ENCOUNTER — Encounter: Admit: 2020-09-21 | Discharge: 2020-09-21 | Payer: BC Managed Care – PPO

## 2020-09-22 ENCOUNTER — Encounter: Admit: 2020-09-22 | Discharge: 2020-09-22 | Payer: BC Managed Care – PPO

## 2020-09-29 ENCOUNTER — Encounter: Admit: 2020-09-29 | Discharge: 2020-09-29 | Payer: BC Managed Care – PPO

## 2020-09-29 DIAGNOSIS — R4589 Other symptoms and signs involving emotional state: Secondary | ICD-10-CM

## 2020-09-29 MED ORDER — DULOXETINE 30 MG PO CPDR
ORAL_CAPSULE | Freq: Every day | ORAL | 0 refills | 60.00000 days | Status: AC
Start: 2020-09-29 — End: ?

## 2020-10-10 ENCOUNTER — Encounter: Admit: 2020-10-10 | Discharge: 2020-10-10 | Payer: BC Managed Care – PPO

## 2020-10-10 DIAGNOSIS — E1122 Type 2 diabetes mellitus with diabetic chronic kidney disease: Secondary | ICD-10-CM

## 2020-10-10 NOTE — Telephone Encounter
Received negative diabetic eye exam completed on 09/28/2020. Report scanned into chart. HM updated. Clayton Lefort, RN

## 2020-10-24 ENCOUNTER — Ambulatory Visit: Admit: 2020-10-24 | Discharge: 2020-10-24 | Payer: BC Managed Care – PPO

## 2020-10-24 ENCOUNTER — Encounter: Admit: 2020-10-24 | Discharge: 2020-10-24 | Payer: BC Managed Care – PPO

## 2020-10-24 DIAGNOSIS — Z23 Encounter for immunization: Secondary | ICD-10-CM

## 2020-10-24 DIAGNOSIS — R1084 Generalized abdominal pain: Secondary | ICD-10-CM

## 2020-10-24 LAB — LIPASE: LIPASE: 32 U/L (ref 11–82)

## 2020-10-27 ENCOUNTER — Encounter: Admit: 2020-10-27 | Discharge: 2020-10-27 | Payer: BC Managed Care – PPO

## 2020-10-27 DIAGNOSIS — M797 Fibromyalgia: Secondary | ICD-10-CM

## 2020-10-27 DIAGNOSIS — J302 Other seasonal allergic rhinitis: Secondary | ICD-10-CM

## 2020-10-27 DIAGNOSIS — Z789 Other specified health status: Secondary | ICD-10-CM

## 2020-10-27 DIAGNOSIS — K5090 Crohn's disease, unspecified, without complications: Secondary | ICD-10-CM

## 2020-10-27 DIAGNOSIS — N183 CKD (chronic kidney disease) stage 3, GFR 30-59 ml/min (HCC): Secondary | ICD-10-CM

## 2020-10-27 DIAGNOSIS — K50918 Crohn's disease, unspecified, with other complication: Principal | ICD-10-CM

## 2020-10-27 DIAGNOSIS — K56609 Unspecified intestinal obstruction, unspecified as to partial versus complete obstruction: Secondary | ICD-10-CM

## 2020-10-27 DIAGNOSIS — I82409 Acute embolism and thrombosis of unspecified deep veins of unspecified lower extremity: Secondary | ICD-10-CM

## 2020-10-27 DIAGNOSIS — K219 Gastro-esophageal reflux disease without esophagitis: Secondary | ICD-10-CM

## 2020-10-27 DIAGNOSIS — E139 Other specified diabetes mellitus without complications: Secondary | ICD-10-CM

## 2020-10-27 DIAGNOSIS — Z8619 Personal history of other infectious and parasitic diseases: Secondary | ICD-10-CM

## 2020-10-27 DIAGNOSIS — K7581 Nonalcoholic steatohepatitis (NASH): Secondary | ICD-10-CM

## 2020-10-27 DIAGNOSIS — E785 Hyperlipidemia, unspecified: Secondary | ICD-10-CM

## 2020-10-27 DIAGNOSIS — E119 Type 2 diabetes mellitus without complications: Secondary | ICD-10-CM

## 2020-10-27 DIAGNOSIS — K639 Disease of intestine, unspecified: Secondary | ICD-10-CM

## 2020-10-27 DIAGNOSIS — R42 Dizziness and giddiness: Secondary | ICD-10-CM

## 2020-10-27 DIAGNOSIS — I8290 Acute embolism and thrombosis of unspecified vein: Secondary | ICD-10-CM

## 2020-10-27 DIAGNOSIS — I10 Essential (primary) hypertension: Secondary | ICD-10-CM

## 2020-10-27 DIAGNOSIS — K859 Acute pancreatitis without necrosis or infection, unspecified: Secondary | ICD-10-CM

## 2020-10-27 DIAGNOSIS — G43909 Migraine, unspecified, not intractable, without status migrainosus: Secondary | ICD-10-CM

## 2020-10-27 DIAGNOSIS — D699 Hemorrhagic condition, unspecified: Secondary | ICD-10-CM

## 2020-10-27 DIAGNOSIS — IMO0002 Ulcer: Secondary | ICD-10-CM

## 2020-10-27 DIAGNOSIS — D4959 Neoplasm of unspecified behavior of other genitourinary organ: Secondary | ICD-10-CM

## 2020-10-27 DIAGNOSIS — G40909 Epilepsy, unspecified, not intractable, without status epilepticus: Secondary | ICD-10-CM

## 2020-10-27 MED ORDER — DIPHENHYDRAMINE HCL 50 MG/ML IJ SOLN
25-50 mg | Freq: Once | INTRAVENOUS | 0 refills | Status: CP
Start: 2020-10-27 — End: ?
  Administered 2020-10-27 (×2): 25 mg via INTRAVENOUS

## 2020-10-27 MED ORDER — ACETAMINOPHEN 500 MG PO TAB
500 mg | Freq: Once | ORAL | 0 refills | Status: CP
Start: 2020-10-27 — End: ?
  Administered 2020-10-27: 19:00:00 500 mg via ORAL

## 2020-10-27 MED ORDER — VEDOLIZUMAB IVPB
300 mg | Freq: Once | INTRAVENOUS | 0 refills
Start: 2020-10-27 — End: ?

## 2020-10-27 MED ORDER — ACETAMINOPHEN 500 MG PO TAB
500 mg | Freq: Once | ORAL | 0 refills
Start: 2020-10-27 — End: ?

## 2020-10-27 MED ORDER — VEDOLIZUMAB IVPB
300 mg | Freq: Once | INTRAVENOUS | 0 refills | Status: CP
Start: 2020-10-27 — End: ?
  Administered 2020-10-27 (×2): 300 mg via INTRAVENOUS

## 2020-10-27 MED ORDER — DIPHENHYDRAMINE HCL 50 MG/ML IJ SOLN
25-50 mg | Freq: Once | INTRAVENOUS | 0 refills
Start: 2020-10-27 — End: ?

## 2020-10-27 NOTE — Progress Notes
Patient presents to infusion clinic for vedolizumab infusion.  Pre-infusion vital signs completed. Ordered labs drawn with peripheral IV start.    Patient denies recent concern for infection, fever, or antibiotic use.    ~1410: Pre-medications administered, see e-mar for details.    ~5638: Infusion started, see e-mar for administration details    ~1625: Infusion completed and flushed. See e-mar for administration details and Post infusion vitals completed. No concern for infusion reaction. Patient has no complaints    ~1630: PIV discontinued. Patient declined printed AVS. Prompted patient to schedule upcoming appointment(s) at infusion checkout or call infusion scheduling. Patient verbalized understanding. Patient ambulated off unit.

## 2020-10-27 NOTE — Patient Instructions
Vedolizumab Injection 300 mg  Uses  For inflammatory bowel disease.  Instructions  This medicine will be given to you at the hospital or infusion center.  This is an IV medicine. It is given through a sterile tube directly into the vein by a healthcare provider.  This medicine is given gradually through the IV line.  This medicine should be given over 30 minutes.  Always inspect the medicine before using.  Check the medicine before each use. If the liquid medicine has any particles in it, appears discolored, or if the vial appears damaged, do not use it.  Keep this medicine in the refrigerator. Do not freeze.  Protect medicine from light.  This medicine should be given by a trained health care provider.  It is important that you keep taking each dose of this medicine on time even if you are feeling well.  If you miss a dose, contact your doctor for instructions.  Please tell your doctor and pharmacist about all the medicines you take. Include both prescription and over-the-counter medicines. Also tell them about any vitamins, herbal medicines, or anything else you take for your health.  Talk to your doctor before taking other medicines, including aspirins and ibuprofen containing products. Speak to your doctor about which medicines are safe to use while you are on this medicine.  Do not suddenly stop taking this medicine. Check with your doctor before stopping.  It is very important that you follow your doctor's instructions for all blood tests.  It is very important that you keep all appointments for medical exams and tests while on this medicine.  Cautions  Tell your doctor and pharmacist if you ever had an allergic reaction to a medicine. Symptoms of an allergic reaction can include trouble breathing, skin rash, itching, swelling, or severe dizziness.  This medicine is associated with a rare but very serious medical condition. Please speak with your doctor about symptoms you should look out for while on this medicine. Notify your doctor immediately if you develop those symptoms.  Some patients on this medicine have developed severe, life-threatening infections. Please speak with your doctor about the risks and benefits of using this medicine.  Some patients taking this medicine have experienced serious side effects. Please speak with your doctor to understand the risks and benefits associated with this medicine.  This medicine is associated with a rare, but serious problem of the liver. Speak to your doctor about the early signs of liver problems and the benefits and risks of using this medicine.  Call the doctor if there are any signs of confusion or unusual changes in behavior.  Family should check on the patient often. Call the doctor if patient becomes more depressed, has thoughts of suicide, or shows changes in behavior.  This medicine may reduce your body's ability to fight infections. Try to avoid contact with people with colds, flu or other infections.  Contact your doctor if you develop any signs of a new infection such as fever, cough, sore throat, or chills.  Wash your hands often and avoid close contact with people with infections such as colds and flu.  Speak with your health care provider before receiving any vaccinations.  Tell the doctor or pharmacist if you are pregnant, planning to be pregnant, or breastfeeding.  Ask your pharmacist if this medicine can interact with any of your other medicines. Be sure to tell them about all the medicines you take.  Please tell all your doctors and dentists that you are  on this medicine before they provide care.  Do not start or stop any other medicines without first speaking to your doctor or pharmacist.  This medicine is associated with increased risk of death in certain patients. Please speak with your doctor about the benefits and risks of using this medicine.  This medicine can cause serious side effects in some patients. Important information from the [place] Food and Drug Administration (FDA) is available from your pharmacist. Please review it carefully with your pharmacist to understand the risks associated with this medicine.  Side Effects  The following is a list of some common side effects from this medicine. Please speak with your doctor about what you should do if you experience these or other side effects.  ? swelling of the legs, feet, and hands  ? headaches  ? reaction at the area of the injection (pain, redness, swelling)  ? pain in the joints  Call your doctor or get medical help right away if you notice any of these more serious side effects:  ? severe abdominal or pelvic pain  ? loss of balance  ? chest pain  ? difficulty concentrating  ? confusion  ? cough that does not go away  ? depression or feeling sad  ? dizziness  ? feeling of heat or flushing  ? fever or chills  ? severe or persistent headache  ? fast or irregular heart beats  ? symptoms of liver damage (such as yellowing of skin or eyes, dark urine, unusual tiredness or weakness; severe stomach or back pain)  ? memory problems or loss  ? mood changes  ? tight or rigid muscles  ? nausea  ? neck pain or stiffness  ? seizures  ? shortness of breath  ? sore throat  ? difficulty speaking  ? suicidal thoughts  ? unusual or unexplained tiredness or weakness  ? unsteadiness while walking  ? upper respiratory infection  ? dark urine  ? blurring or changes of vision  A few people may have an allergic reactions to this medicine. Symptoms can include difficulty breathing, skin rash, itching, swelling, or severe dizziness. If you notice any of these symptoms, seek medical help quickly.  Extra  Please speak with your doctor, nurse, or pharmacist if you have any questions about this medicine.  [email or web address]  IMPORTANT NOTE: This document tells you briefly how to take your medicine, but it does not tell you all there is to know about it.Your doctor or pharmacist may give you other documents about your medicine. Please talk to them if you have any questions.Always follow their advice. There is a more complete description of this medicine available in [place].Scan this code on your smartphone or tablet or use the web address below. You can also ask your pharmacist for a printout. If you have any questions, please ask your pharmacist.   ? 2021 [employer]

## 2020-10-28 ENCOUNTER — Ambulatory Visit: Admit: 2020-10-27 | Discharge: 2020-10-28 | Payer: BC Managed Care – PPO

## 2020-10-30 ENCOUNTER — Encounter: Admit: 2020-10-30 | Discharge: 2020-10-30 | Payer: BC Managed Care – PPO

## 2020-10-30 DIAGNOSIS — R748 Abnormal levels of other serum enzymes: Secondary | ICD-10-CM

## 2020-10-30 DIAGNOSIS — K5080 Crohn's disease of both small and large intestine without complications: Secondary | ICD-10-CM

## 2020-10-30 NOTE — Telephone Encounter
-----  Message from Viviann Spare, MD sent at 10/29/2020  6:55 PM CDT -----  Labs stable except alk phos is a bit higher  I would like to order alk phos to be fractionated with next lab draw

## 2020-10-31 ENCOUNTER — Ambulatory Visit: Admit: 2020-10-31 | Discharge: 2020-10-31 | Payer: BC Managed Care – PPO

## 2020-10-31 ENCOUNTER — Encounter: Admit: 2020-10-31 | Discharge: 2020-10-31 | Payer: BC Managed Care – PPO

## 2020-10-31 DIAGNOSIS — R748 Abnormal levels of other serum enzymes: Secondary | ICD-10-CM

## 2020-10-31 LAB — COMPREHENSIVE METABOLIC PANEL
ALBUMIN: 4.3 g/dL (ref 3.5–5.0)
ALK PHOSPHATASE: 185 U/L — ABNORMAL HIGH (ref 25–110)
ALT: 16 U/L (ref 7–56)
ANION GAP: 14 — ABNORMAL HIGH (ref 3–12)
AST: 14 U/L (ref 7–40)
BLD UREA NITROGEN: 32 mg/dL — ABNORMAL HIGH (ref 7–25)
CALCIUM: 9.8 mg/dL (ref 8.5–10.6)
CHLORIDE: 105 MMOL/L (ref 98–110)
CO2: 23 MMOL/L (ref 21–30)
CREATININE: 1.5 mg/dL — ABNORMAL HIGH (ref 0.4–1.00)
EGFR: 38 mL/min — ABNORMAL LOW (ref >60)
GLUCOSE,PANEL: 163 mg/dL — ABNORMAL HIGH (ref 70–100)
POTASSIUM: 4 MMOL/L (ref 3.5–5.1)
SODIUM: 142 MMOL/L (ref 137–147)
TOTAL BILIRUBIN: 0.4 mg/dL (ref 0.3–1.2)
TOTAL PROTEIN: 6.9 g/dL (ref 6.0–8.0)

## 2020-11-02 ENCOUNTER — Encounter: Admit: 2020-11-02 | Discharge: 2020-11-02 | Payer: BC Managed Care – PPO

## 2020-11-02 DIAGNOSIS — R748 Abnormal levels of other serum enzymes: Secondary | ICD-10-CM

## 2020-11-03 ENCOUNTER — Ambulatory Visit: Admit: 2020-11-03 | Discharge: 2020-11-03 | Payer: BC Managed Care – PPO

## 2020-11-03 ENCOUNTER — Encounter: Admit: 2020-11-03 | Discharge: 2020-11-03 | Payer: BC Managed Care – PPO

## 2020-11-03 DIAGNOSIS — R748 Abnormal levels of other serum enzymes: Secondary | ICD-10-CM

## 2020-11-03 LAB — COMPREHENSIVE METABOLIC PANEL
ALBUMIN: 4.2 g/dL (ref 3.5–5.0)
ALK PHOSPHATASE: 184 U/L — ABNORMAL HIGH (ref 25–110)
ALT: 17 U/L (ref 7–56)
ANION GAP: 11 (ref 3–12)
AST: 13 U/L (ref 7–40)
BLD UREA NITROGEN: 33 mg/dL — ABNORMAL HIGH (ref 7–25)
CO2: 23 MMOL/L (ref 21–30)
CREATININE: 1.3 mg/dL — ABNORMAL HIGH (ref 0.4–1.00)
EGFR: 46 mL/min — ABNORMAL LOW (ref >60)
GLUCOSE,PANEL: 154 mg/dL — ABNORMAL HIGH (ref 70–100)
POTASSIUM: 4.1 MMOL/L (ref 3.5–5.1)
SODIUM: 138 MMOL/L (ref 137–147)
TOTAL BILIRUBIN: 0.4 mg/dL (ref 0.3–1.2)
TOTAL PROTEIN: 6.7 g/dL (ref 6.0–8.0)

## 2020-11-03 LAB — TSH WITH FREE T4 REFLEX: TSH: 3.2 uU/mL (ref 0.35–5.00)

## 2020-11-03 LAB — PHOSPHORUS: PHOSPHORUS: 3.4 mg/dL (ref 2.0–4.5)

## 2020-11-03 LAB — PARATHYROID HORMONE: PTH HORMONE: 44 pg/mL (ref 10–65)

## 2020-11-05 ENCOUNTER — Encounter: Admit: 2020-11-05 | Discharge: 2020-11-05 | Payer: BC Managed Care – PPO

## 2020-11-05 DIAGNOSIS — K50018 Crohn's disease of small intestine with other complication: Secondary | ICD-10-CM

## 2020-11-05 MED ORDER — BUDESONIDE 3 MG PO CECX
ORAL_CAPSULE | Freq: Every day | 1 refills
Start: 2020-11-05 — End: ?

## 2020-11-05 MED ORDER — FAMOTIDINE 20 MG PO TAB
20 mg | ORAL_TABLET | Freq: Every evening | ORAL | 1 refills
Start: 2020-11-05 — End: ?

## 2020-11-13 ENCOUNTER — Ambulatory Visit: Admit: 2020-11-13 | Discharge: 2020-11-13 | Payer: BC Managed Care – PPO

## 2020-11-13 ENCOUNTER — Encounter: Admit: 2020-11-13 | Discharge: 2020-11-13 | Payer: BC Managed Care – PPO

## 2020-11-13 DIAGNOSIS — R748 Abnormal levels of other serum enzymes: Secondary | ICD-10-CM

## 2020-11-13 DIAGNOSIS — K5080 Crohn's disease of both small and large intestine without complications: Secondary | ICD-10-CM

## 2020-11-15 ENCOUNTER — Encounter: Admit: 2020-11-15 | Discharge: 2020-11-15 | Payer: BC Managed Care – PPO

## 2020-11-15 DIAGNOSIS — R748 Abnormal levels of other serum enzymes: Secondary | ICD-10-CM

## 2020-11-15 MED ORDER — RP DX TC-99M MEDRONATE MCI
25 | Freq: Once | INTRAVENOUS | 0 refills | Status: CP
Start: 2020-11-15 — End: ?
  Administered 2020-11-15: 16:00:00 25.1 via INTRAVENOUS

## 2020-11-22 ENCOUNTER — Encounter: Admit: 2020-11-22 | Discharge: 2020-11-22 | Payer: BC Managed Care – PPO

## 2020-11-23 ENCOUNTER — Encounter: Admit: 2020-11-23 | Discharge: 2020-11-23 | Payer: BC Managed Care – PPO

## 2020-11-23 ENCOUNTER — Ambulatory Visit: Admit: 2020-11-23 | Discharge: 2020-11-24 | Payer: BC Managed Care – PPO

## 2020-11-23 DIAGNOSIS — N1831 Stage 3a chronic kidney disease (HCC): Secondary | ICD-10-CM

## 2020-11-23 DIAGNOSIS — K219 Gastro-esophageal reflux disease without esophagitis: Secondary | ICD-10-CM

## 2020-11-23 DIAGNOSIS — K50918 Crohn's disease, unspecified, with other complication: Secondary | ICD-10-CM

## 2020-11-23 DIAGNOSIS — J302 Other seasonal allergic rhinitis: Secondary | ICD-10-CM

## 2020-11-23 DIAGNOSIS — I8290 Acute embolism and thrombosis of unspecified vein: Secondary | ICD-10-CM

## 2020-11-23 DIAGNOSIS — E139 Other specified diabetes mellitus without complications: Secondary | ICD-10-CM

## 2020-11-23 DIAGNOSIS — R748 Abnormal levels of other serum enzymes: Secondary | ICD-10-CM

## 2020-11-23 DIAGNOSIS — K639 Disease of intestine, unspecified: Secondary | ICD-10-CM

## 2020-11-23 DIAGNOSIS — IMO0002 Ulcer: Secondary | ICD-10-CM

## 2020-11-23 DIAGNOSIS — N183 CKD (chronic kidney disease) stage 3, GFR 30-59 ml/min (HCC): Secondary | ICD-10-CM

## 2020-11-23 DIAGNOSIS — M797 Fibromyalgia: Secondary | ICD-10-CM

## 2020-11-23 DIAGNOSIS — D699 Hemorrhagic condition, unspecified: Secondary | ICD-10-CM

## 2020-11-23 DIAGNOSIS — F331 Major depressive disorder, recurrent, moderate: Secondary | ICD-10-CM

## 2020-11-23 DIAGNOSIS — G40909 Epilepsy, unspecified, not intractable, without status epilepticus: Secondary | ICD-10-CM

## 2020-11-23 DIAGNOSIS — G43909 Migraine, unspecified, not intractable, without status migrainosus: Secondary | ICD-10-CM

## 2020-11-23 DIAGNOSIS — I82409 Acute embolism and thrombosis of unspecified deep veins of unspecified lower extremity: Secondary | ICD-10-CM

## 2020-11-23 DIAGNOSIS — E119 Type 2 diabetes mellitus without complications: Secondary | ICD-10-CM

## 2020-11-23 DIAGNOSIS — K7581 Nonalcoholic steatohepatitis (NASH): Secondary | ICD-10-CM

## 2020-11-23 DIAGNOSIS — Z8619 Personal history of other infectious and parasitic diseases: Secondary | ICD-10-CM

## 2020-11-23 DIAGNOSIS — K56609 Unspecified intestinal obstruction, unspecified as to partial versus complete obstruction: Secondary | ICD-10-CM

## 2020-11-23 DIAGNOSIS — E785 Hyperlipidemia, unspecified: Secondary | ICD-10-CM

## 2020-11-23 DIAGNOSIS — K5090 Crohn's disease, unspecified, without complications: Secondary | ICD-10-CM

## 2020-11-23 DIAGNOSIS — Z789 Other specified health status: Secondary | ICD-10-CM

## 2020-11-23 DIAGNOSIS — K859 Acute pancreatitis without necrosis or infection, unspecified: Secondary | ICD-10-CM

## 2020-11-23 DIAGNOSIS — I10 Essential (primary) hypertension: Secondary | ICD-10-CM

## 2020-11-23 DIAGNOSIS — R42 Dizziness and giddiness: Secondary | ICD-10-CM

## 2020-11-23 DIAGNOSIS — D4959 Neoplasm of unspecified behavior of other genitourinary organ: Secondary | ICD-10-CM

## 2020-11-23 NOTE — Progress Notes
Obtained patient's verbal consent to treat them and their agreement to [facility] financial policy and NPP via this telehealth visit during the [facility] Emergency         Tina Snyder is a 55 y.o. female.    INTERVAL hx    She is here for follow-up of her cross taper from duloxetine to elavil. She had worsening depression and poor sleep.  She felt the elavil makes her groggy in the AM, so only takes on the weekend.  Restarted qHS flexeril for pain.   Feels depressed mood is stable, off duloxetine, pain probably stable.     She had an elevated alkaline phosphatase with extensive work up that has been reassuring.  She is going to be following w/ Dr. Allena Katz now.       CHRONIC hx  Depression   wellbutrin 300mg , elavil 10mg  (not taking)  Prior: Fluoxetine 10mg  Prozac, Duloxetine 60mg  daily, elavil 50mg   H.o hospitalization as a teenage for SI.  She had a nervous breakdown in 2004 and was hospitalized for 3 days.      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 She was briefly on coumadin but stopped in setting of recurrent bleeding. She has a history of subclavian thrombosis. Recent CT imaging showed a lot of chest wall collaterals.  Pt not aware.  Not on any therapy.  [place] of her left arm showed no DVT. She had a negative VQ scan for PE. She had a CT chest 2016 Development of anterior superior mediastinal vascular collaterals secondary to marked narrowing or occlusion of the left brachiocephalic vein. 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  She had a CTA chest in 2019 with Dr. Hollie Beach and left brachiocephalic vein atresia noted with prominent collaterals. Dr. Gaspar Bidding felt she may need other systemic evaluation to help determine the cause of her swelling.  Her anti CCP and RF were negative in 2020.    Peripheral edema:  Progressive edema in her bilateral arms, underarms and chest wall and additionally in her feet. Started in LUE predominantly. Noted in RUE after a prolonged hospital stay in 2019 where she lay on her right side.  She has been on lasix since 2018.  She has a history of subclavian thrombus with chest wall collaterals in addition to intermittent steroids.  Hands are getting thicker, had to change ring size.  Ruddy skin complexion is new.     History of Iron Deficiency Anemia  Resolved with IV iron.    History of Syncope:  She has seen cardiology. Diltiazem held due to orthostatic concerns. She had a TTE on 07/02/2017 which showed EF 70%.  Carotid u/s was unremarkable. She had an EEG on 06/05/2017 which was fine.   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 Chronic NSAID use considered.     Crohns  [facility], GI  Budesonide 3mg ,  entyvio   Prior: mtx (fracture), entecort    Chronic Pancreatitis  [facility], GI  Drx in August 2021 after discovery following SBO.  Lipase > 1000.   EUS in 02/2020 showed evidence of chronic pancreatitis.     MAI:   [place], Pulmonary.   She has been on three times weekly azithromycin in the past.     Diabetes Management:  Current regimen: glargine 30 w/ aspart 16-20 w/ meals  Endocrine:  Yes We have been avoiding  SGLT2/GLP-1 in this patient due to recurrent yeast infections as well as Crohn's disease.  Complications:  Nephropathy, Gastropathy/Delayed gastric emptying, CAD, PVD  Poc Hemoglobin A1C   Date Value Ref Range Status   09/05/2020 6.4 (A) 4 - 6 % Final   03/26/2019 7.5 (H) 4 - 6 % Final   03/27/2018 9.2 % Final     Lab Results   Component Value Date/Time    HGBA1C 6.8 (H) 10/14/2019 02:46 PM    HGBA1C 9.1 (H) 11/16/2018 11:13 AM    HGBA1C 9.3 (H) 10/24/2015 03:50 AM    A1C 6.4 (A) 09/05/2020 12:00 AM    CHOL 119 09/19/2020 10:04 AM    TRIG 173 (H) 09/19/2020 10:04 AM    HDL 30 (L) 09/19/2020 10:04 AM    LDL 66 09/19/2020 10:04 AM    VLDL 35 09/19/2020 10:04 AM    NONHDLCHOL 89 09/19/2020 10:04 AM    CR 1.36 (H) 11/03/2020 11:07 AM MCALBR 64.7 09/19/2020 10:04 AM    Imp: Diabetes unknown     Hypertension Management:    Current regimen: bumex 1mg ,  Spironolactone 50mg  tablet, Entivio   Prior: hydralazine 25mg  three times a day, bystolic 10mg  daily, diltiazem 240mg  daily,  CVD Hx: none  Smoker: No  Outside blood pressures being performed: Yes  BP Readings from Last 3 Encounters:   10/27/20 (!) 146/76   09/19/20 (!) 168/92   09/05/20 139/78     Fibromyalgia  Duloxetine 40mg  daily, flexeril prn    Sleep Apnea  CPAP  Sleep study 10/03/2017    Social:  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   HENT: Negative for congestion and sore throat.    Eyes: Negative for visual disturbance.   Respiratory: Negative for cough, chest tightness, shortness of breath and wheezing.    Cardiovascular: Negative for chest pain and leg swelling.   Gastrointestinal: Negative for abdominal pain, blood in stool, nausea and vomiting.   Genitourinary: Negative for difficulty urinating and urgency.   Musculoskeletal: Negative for arthralgias, joint swelling, myalgias and neck pain.   Skin: Negative for color change and rash.   Neurological: Negative for dizziness, speech difficulty, weakness, light-headedness, numbness and headaches.   Psychiatric/Behavioral: Positive for decreased concentration, dysphoric mood and sleep disturbance. Negative for agitation, behavioral problems, confusion, hallucinations, self-injury and suicidal ideas. The patient is nervous/anxious. The patient is not hyperactive.          Objective:         ? amitriptyline (ELAVIL) 10 mg tablet Take one tablet by mouth at bedtime daily.   ? atorvastatin (LIPITOR) 40 mg tablet TAKE 1 TABLET BY MOUTH EVERY DAY   ? AUVI-Q 0.3 mg/0.3 mL auto-injector Inject 0.3 mg (1 Pen) into thigh if needed for anaphylactic reaction. May repeat in 5-15 minutes if needed.   ? BASAGLAR KWIKPEN U-100 INSULIN 100 unit/mL (3 mL) subcutaneous PEN INJECT THIRTY UNITS UNDER THE SKIN DAILY   ? Blood-Glucose Sensor (DEXCOM G6 SENSOR) devi Use 1 each as directed as directed. Change every 10 days   ? budesonide (ENTOCORT EC) 3 mg capsule TAKE 2 CAPSULES BY MOUTH DAILY.   ? buPROPion XL (WELLBUTRIN XL) 300 mg tablet Take one tablet by mouth every morning. Do not crush or chew.   ? dapagliflozin (FARXIGA)  5 mg tablet Take one tablet by mouth daily.   ? DEXCOM G6 RECEIVER misc Use 1 each as directed daily.   ? DEXCOM G6 TRANSMITTER devi Use 1 each as directed as directed. Every 90 days   ? dexlansoprazole (DEXILANT) 60 mg capsule Take one capsule by mouth daily.   ? diclofenac sodium (VOLTAREN) 1 % topical gel Apply four g topically to affected area four times daily.   ? duloxetine DR (CYMBALTA) 30 mg capsule TAKE 1 CAPSULE BY MOUTH EVERY DAY   ? famotidine (PEPCID) 20 mg tablet TAKE ONE TABLET BY MOUTH AT BEDTIME DAILY.   ? glucagon (BAQSIMI) 3 mg/actuation nasal spray Use 1 spray in single nostril. If no response, may repeat in 15 minutes using second device.   ? icosapent ethyL (VASCEPA) 1 gram capsule Take two capsules by mouth twice daily with meals.   ? insulin aspart (U-100) (NOVOLOG FLEXPEN U-100 INSULIN) 100 unit/mL (3 mL) PEN Inject 20 units with meals plus correction. Max 80 units/day.   ? insulin pen needles (disposable) (B-D NANO) 32 gauge x 5/32 pen needle Use one each as directed before meals and at bedtime. Use with insulin injections.   ? montelukast (SINGULAIR) 10 mg tablet Take one tablet by mouth at bedtime daily.   ? nebivolol (BYSTOLIC) 10 mg tablet Take one tablet by mouth daily.   ? ondansetron HCL (ZOFRAN) 4 mg tablet Take one tablet by mouth every 8 hours.   ? pantoprazole DR (PROTONIX) 40 mg tablet Take one tablet by mouth daily.   ? polyethylene glycol 3350 (MIRALAX) 17 gram/dose powder Take seventeen g by mouth twice daily.   ? promethazine (PHENERGAN) 25 mg tablet Take one tablet by mouth every 6 hours as needed for Nausea or Vomiting.   ? triamcinolone acetonide (KENALOG) 0.1 % topical ointment Apply  topically to affected area twice daily.   ? vedolizumab (ENTYVIO IV) Administer  through vein every 8 weeks.     There were no vitals filed for this visit.  There were no vitals filed for this visit.    There is no height or weight on file to calculate BMI.     Physical Exam  Vitals and nursing note reviewed.   Constitutional:       Appearance: Normal appearance. She is well-developed.   HENT:      Head: Normocephalic and atraumatic.   Neurological:      Mental Status: She is alert.   Psychiatric:         Mood and Affect: Mood normal.         Behavior: Behavior normal.         Thought Content: Thought content normal.         Judgment: Judgment normal.         Labwork reviewed:  Lab Results   Component Value Date/Time    HGBA1C 6.8 (H) 10/14/2019 02:46 PM    HGBA1C 9.1 (H) 11/16/2018 11:13 AM    HGBA1C 9.3 (H) 10/24/2015 03:50 AM    A1C 6.4 (A) 09/05/2020 12:00 AM    MCALBR 64.7 09/19/2020 10:04 AM    TSH 3.23 11/03/2020 11:07 AM    FREET4R 0.7 09/25/2007 12:30 PM    CHOL 119 09/19/2020 10:04 AM    TRIG 173 (H) 09/19/2020 10:04 AM    HDL 30 (L) 09/19/2020 10:04 AM    LDL 66 09/19/2020 10:04 AM    NA 138 11/03/2020 11:07 AM    K 4.1 11/03/2020 11:07  AM    CL 104 11/03/2020 11:07 AM    CO2 23 11/03/2020 11:07 AM    GAP 11 11/03/2020 11:07 AM    BUN 33 (H) 11/03/2020 11:07 AM    CR 1.36 (H) 11/03/2020 11:07 AM    GLU 154 (H) 11/03/2020 11:07 AM    CA 9.4 11/03/2020 11:07 AM    PO4 3.4 11/03/2020 11:07 AM    ALBUMIN 4.2 11/03/2020 11:07 AM    TOTPROT 6.7 11/03/2020 11:07 AM    ALKPHOS 184 (H) 11/03/2020 11:07 AM    AST 13 11/03/2020 11:07 AM    ALT 17 11/03/2020 11:07 AM    TOTBILI 0.4 11/03/2020 11:07 AM    GFR 43 (L) 03/23/2020 01:04 PM    GFRAA 52 (L) 03/23/2020 01:04 PM            Assessment and Plan:    Tina Snyder was seen today    Diagnoses and all orders for this visit:    Depression - Major recurrent active  Stable off duloxetine.   She will try elavil daily  qHS x 2 weeks to see if grogginess resolves.  If not, may consider an SSRI trial given the extent of her depressed mood. She has been on fluoxetine in the past, unsure of response.     > continue wellbutrin to 300  > cross taper duloxetine to elavil qHS  > 1 mo f/u  > amb ref behavioral health for adjustment reaction to acute illness at prior visits  > encourage ongoing discussion w/ pastor.    Crohn's disease of small intestine with other complication (HCC): Chronic and active. Normal stool.   > reviewed recommendations  > b12 monthly    Elevated Alk Phos / Normal GGTP  CKD3  Reviewed work up which has included liver u/s and bone scan were all reassuring. Certainly at risk for sclerosing cholangitis w/ her history of crohns but the GGTP was normal.  This can all be renal associated.   We can continue to follow.    BMI 36  She is losing weight through diet and exercise. Congratulated efforts.      -----------------------------     Type 2 diabetes mellitus without complication, without long-term current use of insulin (HCC):   Follows in endocrine here with scheduled followup after the long weekend.   Excellent control  > LDL at goal 70  > BP at goal    Essential hypertension: Better control. Following closely w/ cardiology.    Mycobacterium avium-intracellulare complex (HCC)  > Inactive. 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 Upper >> lower extremity. 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 Hollie Beach does not feel this is responsible for her edema.  She has had significant improvement with lymphedema therapy and will continue to do so.                                There are no Patient Instructions on file for this visit.    No follow-ups on file.   Answers for HPI/ROS submitted by the patient on 12/21/2019  What medical problem brings you in to see the provider?: Follow up from hospital stay and medication adjustment  Your symptom is...: recurrent  When did they start?: more than  1 month ago  How often do you feel this symptom?: constantly  How has the symptom changed?: unchanged  On a scale of 0 to 10 (10 being the worst), how strong is your pain?: 7/10  How severe is your pain?: moderate  No appetite: No  Change in bowel habit: Yes  Swollen glands: No  Urinary symptoms: No  Vertigo: Yes  Visual change: No  Which of the following, if any, make(s) your symptom worse?: drinking, eating, exertion, stress  Which of the following treatments have you tried?: heat, ice, immobilization, lying down, rest, sleep, walking  If you've tried a treatment for this problem, how much relief did you experience?: moderate      Answers for HPI/ROS submitted by the patient on 08/30/2020  What medical problem brings you in to see the provider?: Depression, Numbness is feet, arms and hands  Your symptom is...: recurrent  When did they start?: 1 to 4 weeks ago  How often do you feel this symptom?: 2 to 4 times per day  How has the symptom changed?: unchanged  On a scale of 0 to 10 (10 being the worst), how strong is your pain?: 6/10  How severe is your pain?: moderate  No appetite: Yes  Change in bowel habit: No  Swollen glands: No  Urinary symptoms: No  Vertigo: No  Visual change: No  Which of the following, if any, make(s) your symptom worse?: bending, standing, stress, walking  Which of the following treatments have you tried?: acetaminophen, heat, ice, NSAIDs, position changes, rest, sleep, walking  If you've tried a treatment for this problem, how much relief did you experience?: no relief      Answers for HPI/ROS submitted by the patient on 11/21/2020  What medical problem brings you in to see the provider?: Test results and medicine check  Your symptom is...: recurrent  How has the symptom changed?: waxing and waning  On a scale of 0 to 10 (10 being the worst), how strong is your pain?: 7/10  How severe is your pain?: moderate  No appetite: Yes  Change in bowel habit: Yes  Swollen glands: Yes  Urinary symptoms: No  Vertigo: No  Visual change: Yes  Which of the following, if any, make(s) your symptom worse?: bending, exertion, intercourse, standing, stress, twisting, walking  Which of the following treatments have you tried?: acetaminophen, heat, ice, lying down, NSAIDs, oral narcotics, position changes, relaxation, rest, sleep, walking  If you've tried a treatment for this problem, how much relief did you experience?: mild

## 2020-11-28 ENCOUNTER — Ambulatory Visit: Admit: 2020-11-28 | Discharge: 2020-11-28 | Payer: BC Managed Care – PPO

## 2020-11-28 ENCOUNTER — Encounter: Admit: 2020-11-28 | Discharge: 2020-11-28 | Payer: BC Managed Care – PPO

## 2020-11-28 DIAGNOSIS — Z1231 Encounter for screening mammogram for malignant neoplasm of breast: Secondary | ICD-10-CM

## 2020-12-05 ENCOUNTER — Ambulatory Visit: Admit: 2020-12-05 | Discharge: 2020-12-06 | Payer: BC Managed Care – PPO

## 2020-12-05 ENCOUNTER — Encounter: Admit: 2020-12-05 | Discharge: 2020-12-05 | Payer: BC Managed Care – PPO

## 2020-12-05 DIAGNOSIS — R42 Dizziness and giddiness: Secondary | ICD-10-CM

## 2020-12-05 DIAGNOSIS — N183 CKD (chronic kidney disease) stage 3, GFR 30-59 ml/min (HCC): Secondary | ICD-10-CM

## 2020-12-05 DIAGNOSIS — Z79899 Other long term (current) drug therapy: Secondary | ICD-10-CM

## 2020-12-05 DIAGNOSIS — K859 Acute pancreatitis without necrosis or infection, unspecified: Secondary | ICD-10-CM

## 2020-12-05 DIAGNOSIS — E119 Type 2 diabetes mellitus without complications: Secondary | ICD-10-CM

## 2020-12-05 DIAGNOSIS — A310 Pulmonary mycobacterial infection: Secondary | ICD-10-CM

## 2020-12-05 DIAGNOSIS — I82409 Acute embolism and thrombosis of unspecified deep veins of unspecified lower extremity: Secondary | ICD-10-CM

## 2020-12-05 DIAGNOSIS — IMO0002 Ulcer: Secondary | ICD-10-CM

## 2020-12-05 DIAGNOSIS — G43909 Migraine, unspecified, not intractable, without status migrainosus: Secondary | ICD-10-CM

## 2020-12-05 DIAGNOSIS — K219 Gastro-esophageal reflux disease without esophagitis: Secondary | ICD-10-CM

## 2020-12-05 DIAGNOSIS — Z789 Other specified health status: Secondary | ICD-10-CM

## 2020-12-05 DIAGNOSIS — R748 Abnormal levels of other serum enzymes: Secondary | ICD-10-CM

## 2020-12-05 DIAGNOSIS — K639 Disease of intestine, unspecified: Secondary | ICD-10-CM

## 2020-12-05 DIAGNOSIS — I8290 Acute embolism and thrombosis of unspecified vein: Secondary | ICD-10-CM

## 2020-12-05 DIAGNOSIS — E785 Hyperlipidemia, unspecified: Secondary | ICD-10-CM

## 2020-12-05 DIAGNOSIS — Z8619 Personal history of other infectious and parasitic diseases: Secondary | ICD-10-CM

## 2020-12-05 DIAGNOSIS — J302 Other seasonal allergic rhinitis: Secondary | ICD-10-CM

## 2020-12-05 DIAGNOSIS — K56609 Unspecified intestinal obstruction, unspecified as to partial versus complete obstruction: Secondary | ICD-10-CM

## 2020-12-05 DIAGNOSIS — G40909 Epilepsy, unspecified, not intractable, without status epilepticus: Secondary | ICD-10-CM

## 2020-12-05 DIAGNOSIS — R1084 Generalized abdominal pain: Secondary | ICD-10-CM

## 2020-12-05 DIAGNOSIS — E139 Other specified diabetes mellitus without complications: Secondary | ICD-10-CM

## 2020-12-05 DIAGNOSIS — K7581 Nonalcoholic steatohepatitis (NASH): Secondary | ICD-10-CM

## 2020-12-05 DIAGNOSIS — I10 Essential (primary) hypertension: Secondary | ICD-10-CM

## 2020-12-05 DIAGNOSIS — D699 Hemorrhagic condition, unspecified: Secondary | ICD-10-CM

## 2020-12-05 DIAGNOSIS — M797 Fibromyalgia: Secondary | ICD-10-CM

## 2020-12-05 DIAGNOSIS — K5090 Crohn's disease, unspecified, without complications: Secondary | ICD-10-CM

## 2020-12-05 DIAGNOSIS — K5080 Crohn's disease of both small and large intestine without complications: Secondary | ICD-10-CM

## 2020-12-05 DIAGNOSIS — D4959 Neoplasm of unspecified behavior of other genitourinary organ: Secondary | ICD-10-CM

## 2020-12-18 ENCOUNTER — Encounter: Admit: 2020-12-18 | Discharge: 2020-12-18 | Payer: BC Managed Care – PPO

## 2020-12-19 ENCOUNTER — Encounter: Admit: 2020-12-19 | Discharge: 2020-12-19 | Payer: BC Managed Care – PPO

## 2020-12-19 NOTE — Telephone Encounter
Received a fax from [facility] requesting last office note  Printed and faxed to [phone]

## 2020-12-22 ENCOUNTER — Ambulatory Visit: Admit: 2020-12-22 | Discharge: 2020-12-22 | Payer: BC Managed Care – PPO

## 2020-12-22 ENCOUNTER — Encounter: Admit: 2020-12-22 | Discharge: 2020-12-22 | Payer: BC Managed Care – PPO

## 2020-12-22 DIAGNOSIS — K50918 Crohn's disease, unspecified, with other complication: Secondary | ICD-10-CM

## 2020-12-22 MED ORDER — DIPHENHYDRAMINE HCL 50 MG/ML IJ SOLN
25 mg | Freq: Once | INTRAVENOUS | 0 refills | Status: CP | PRN
Start: 2020-12-22 — End: ?
  Administered 2020-12-22: 20:00:00 25 mg via INTRAVENOUS

## 2020-12-22 MED ORDER — DIPHENHYDRAMINE HCL 50 MG/ML IJ SOLN
25-50 mg | Freq: Once | INTRAVENOUS | 0 refills | Status: CN
Start: 2020-12-22 — End: ?

## 2020-12-22 MED ORDER — ACETAMINOPHEN 500 MG PO TAB
500 mg | Freq: Once | ORAL | 0 refills
Start: 2020-12-22 — End: ?

## 2020-12-22 MED ORDER — VEDOLIZUMAB IVPB
300 mg | Freq: Once | INTRAVENOUS | 0 refills | Status: CP
Start: 2020-12-22 — End: ?
  Administered 2020-12-22 (×2): 300 mg via INTRAVENOUS

## 2020-12-22 MED ORDER — VEDOLIZUMAB IVPB
300 mg | Freq: Once | INTRAVENOUS | 0 refills
Start: 2020-12-22 — End: ?

## 2020-12-22 MED ORDER — DIPHENHYDRAMINE HCL 50 MG/ML IJ SOLN
25 mg | Freq: Once | INTRAVENOUS | 0 refills
Start: 2020-12-22 — End: ?

## 2020-12-22 MED ORDER — ACETAMINOPHEN 500 MG PO TAB
500 mg | Freq: Once | ORAL | 0 refills | Status: CP
Start: 2020-12-22 — End: ?
  Administered 2020-12-22: 19:00:00 500 mg via ORAL

## 2020-12-22 MED ORDER — DIPHENHYDRAMINE HCL 50 MG/ML IJ SOLN
25-50 mg | Freq: Once | INTRAVENOUS | 0 refills | Status: CP
Start: 2020-12-22 — End: ?
  Administered 2020-12-22: 19:00:00 50 mg via INTRAVENOUS

## 2020-12-22 NOTE — Progress Notes
Patient presents to infusion clinic for Entyvio infusion.  Pre-infusion vital signs completed. Ordered labs drawn with peripheral IV start.    Patient denies recent concern for infection, fever, or antibiotic use.    1353: Pre-medications administered, see e-mar for details.    1415: Infusion started, see e-mar for administration details    1452 Infusion completed and flushed. See e-mar for administration details and Post infusion vitals completed. No concern for infusion reaction. Patient has no complaints    1457: PIV discontinued. Patient declined printed AVS. Prompted patient to schedule upcoming appointment(s) at infusion checkout or call infusion scheduling. Patient verbalized understanding. Patient ambulated off unit. @ 1500

## 2020-12-27 ENCOUNTER — Encounter: Admit: 2020-12-27 | Discharge: 2020-12-27 | Payer: BC Managed Care – PPO

## 2020-12-27 ENCOUNTER — Ambulatory Visit: Admit: 2020-12-27 | Discharge: 2020-12-27 | Payer: BC Managed Care – PPO

## 2020-12-27 DIAGNOSIS — K5090 Crohn's disease, unspecified, without complications: Secondary | ICD-10-CM

## 2020-12-27 DIAGNOSIS — M797 Fibromyalgia: Secondary | ICD-10-CM

## 2020-12-27 DIAGNOSIS — IMO0002 Ulcer: Secondary | ICD-10-CM

## 2020-12-27 DIAGNOSIS — I10 Essential (primary) hypertension: Secondary | ICD-10-CM

## 2020-12-27 DIAGNOSIS — G40909 Epilepsy, unspecified, not intractable, without status epilepticus: Secondary | ICD-10-CM

## 2020-12-27 DIAGNOSIS — Z8619 Personal history of other infectious and parasitic diseases: Secondary | ICD-10-CM

## 2020-12-27 DIAGNOSIS — D699 Hemorrhagic condition, unspecified: Secondary | ICD-10-CM

## 2020-12-27 DIAGNOSIS — J302 Other seasonal allergic rhinitis: Secondary | ICD-10-CM

## 2020-12-27 DIAGNOSIS — Z789 Other specified health status: Secondary | ICD-10-CM

## 2020-12-27 DIAGNOSIS — R42 Dizziness and giddiness: Secondary | ICD-10-CM

## 2020-12-27 DIAGNOSIS — K859 Acute pancreatitis without necrosis or infection, unspecified: Secondary | ICD-10-CM

## 2020-12-27 DIAGNOSIS — K219 Gastro-esophageal reflux disease without esophagitis: Secondary | ICD-10-CM

## 2020-12-27 DIAGNOSIS — N183 CKD (chronic kidney disease) stage 3, GFR 30-59 ml/min (HCC): Secondary | ICD-10-CM

## 2020-12-27 DIAGNOSIS — K639 Disease of intestine, unspecified: Secondary | ICD-10-CM

## 2020-12-27 DIAGNOSIS — E1165 Type 2 diabetes mellitus with hyperglycemia: Secondary | ICD-10-CM

## 2020-12-27 DIAGNOSIS — E785 Hyperlipidemia, unspecified: Secondary | ICD-10-CM

## 2020-12-27 DIAGNOSIS — E139 Other specified diabetes mellitus without complications: Secondary | ICD-10-CM

## 2020-12-27 DIAGNOSIS — D4959 Neoplasm of unspecified behavior of other genitourinary organ: Secondary | ICD-10-CM

## 2020-12-27 DIAGNOSIS — Z794 Long term (current) use of insulin: Secondary | ICD-10-CM

## 2020-12-27 DIAGNOSIS — I82409 Acute embolism and thrombosis of unspecified deep veins of unspecified lower extremity: Secondary | ICD-10-CM

## 2020-12-27 DIAGNOSIS — K56609 Unspecified intestinal obstruction, unspecified as to partial versus complete obstruction: Secondary | ICD-10-CM

## 2020-12-27 DIAGNOSIS — I8290 Acute embolism and thrombosis of unspecified vein: Secondary | ICD-10-CM

## 2020-12-27 DIAGNOSIS — K7581 Nonalcoholic steatohepatitis (NASH): Secondary | ICD-10-CM

## 2020-12-27 DIAGNOSIS — G43909 Migraine, unspecified, not intractable, without status migrainosus: Secondary | ICD-10-CM

## 2020-12-27 DIAGNOSIS — E119 Type 2 diabetes mellitus without complications: Secondary | ICD-10-CM

## 2020-12-27 MED ORDER — INSULIN GLARGINE 100 UNIT/ML (3 ML) SC INJ PEN
32 [IU] | Freq: Every evening | SUBCUTANEOUS | 3 refills | 60.00000 days | Status: AC
Start: 2020-12-27 — End: ?

## 2020-12-27 NOTE — Progress Notes
Subjective:       Diabetes      Tina Snyder is a 55 y.o. female who was seen today as a follow up for type 2 DM      Interval changes:   Watching portions of foods and has been successful with weight loss since last year.   Appetite is hit or miss with her Crohn's and she has been feeling uncomfortable.   ?  Summary of Glucose Control:??  Lab Results   Component Value Date    A1C 6.2 (A) 12/27/2020    A1C 6.4 (A) 09/05/2020    GLUPOC 116 12/27/2020       Dexcom download:      -----------------------------  Tina Snyder  Date of Birth: 12-05-1965  Generated at: Wed, Dec 27, 2020 4:23 PM CDT  Reporting period: Tue Nov 28, 2020 - Wed Dec 27, 2020  -----------------------------  Glucose Details  Average glucose: 167 mg/dL  Standard deviation: 38 mg/dL  GMI: 1.6%  -----------------------------  Time in Range  Very High: 3%  High: 29%  In Range: 67%  Low: <1%  Very Low: <1%    Target Range  70-180 mg/dL    -----------------------------  CGM Details  Sensor usage: 100%  Days with CGM data: 30/30    Trends:   - Time in range is near goal without hypoglycemia.   - Fasting hyperglycemia   ?  Type 2 DM:   Dx: 2012  Current regimen:  Lantus 30 units daily, Novolog 25 units with meals, Farxiga 5 mg daily    Frequency of hypoglycemia infrequent  ? How many meals per day??3 meals with snack ?  Weight changes:   Exercise? no   Lipid: lipitor 40 mg daily      Family History of diabetes: none, son with celiac  Social History: lives with her husband, [place], in [place]. Does not drink or smoke.    ?  Medical History:   Diagnosis Date   ? Bleeding tendency (HCC)    ? 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 Bowel disease    ? CKD (chronic kidney disease) stage 3, GFR 30-59 ml/min (HCC)    ? Crohn disease (HCC)    ? Deep vein thrombosis (DVT) (HCC)    ? Difficult intravenous access    ? Dizziness    ? DM (diabetes mellitus), secondary-steroid-induced 2012   ? Fibromyalgia    ? GERD (gastroesophageal reflux disease)    ? H/O Mycobacterium avium intracellulare infection    ? Hyperlipidemia    ? Hypertension    ? Hypertensive heart disease    ? Migraines    ? Pancreatitis    ? Seasonal allergic reaction    ? Seizure disorder (HCC)    ? Small bowel obstruction (HCC)    ? Steatohepatitis     felt secondary to medications   ? Type 2 diabetes mellitus without complication, with long-term current use of insulin (HCC) 10/22/2017   ? Ulcer    ? Vaginal tumor          Review of Systems  Positive for stomach pain, shooting pains in feet/legs.       Objective:         ? amitriptyline (ELAVIL) 10 mg tablet Take one tablet by mouth at bedtime daily.   ? atorvastatin (LIPITOR) 40 mg tablet TAKE 1 TABLET BY MOUTH EVERY DAY   ? AUVI-Q 0.3 mg/0.3 mL auto-injector Inject 0.3 mg (  1 Pen) into thigh if needed for anaphylactic reaction. May repeat in 5-15 minutes if needed.   ? BASAGLAR KWIKPEN U-100 INSULIN 100 unit/mL (3 mL) subcutaneous PEN INJECT THIRTY UNITS UNDER THE SKIN DAILY   ? Blood-Glucose Sensor (DEXCOM G6 SENSOR) devi Use 1 each as directed as directed. Change every 10 days   ? budesonide (ENTOCORT EC) 3 mg capsule TAKE 2 CAPSULES BY MOUTH DAILY.   ? buPROPion XL (WELLBUTRIN XL) 300 mg tablet Take one tablet by mouth every morning. Do not crush or chew.   ? dapagliflozin (FARXIGA) 5 mg tablet Take one tablet by mouth daily.   ? DEXCOM G6 RECEIVER misc Use 1 each as directed daily.   ? DEXCOM G6 TRANSMITTER devi Use 1 each as directed as directed. Every 90 days   ? dexlansoprazole (DEXILANT) 60 mg capsule Take one capsule by mouth daily.   ? diclofenac sodium (VOLTAREN) 1 % topical gel Apply four g topically to affected area four times daily.   ? duloxetine DR (CYMBALTA) 30 mg capsule TAKE 1 CAPSULE BY MOUTH EVERY DAY   ? famotidine (PEPCID) 20 mg tablet TAKE ONE TABLET BY MOUTH AT BEDTIME DAILY.   ? glucagon (BAQSIMI) 3 mg/actuation nasal spray Use 1 spray in single nostril. If no response, may repeat in 15 minutes using second device.   ? icosapent ethyL (VASCEPA) 1 gram capsule Take two capsules by mouth twice daily with meals.   ? insulin aspart (U-100) (NOVOLOG FLEXPEN U-100 INSULIN) 100 unit/mL (3 mL) PEN Inject 20 units with meals plus correction. Max 80 units/day.   ? insulin pen needles (disposable) (B-D NANO) 32 gauge x 5/32 pen needle Use one each as directed before meals and at bedtime. Use with insulin injections.   ? montelukast (SINGULAIR) 10 mg tablet Take one tablet by mouth at bedtime daily.   ? nebivolol (BYSTOLIC) 10 mg tablet Take one tablet by mouth daily.   ? ondansetron HCL (ZOFRAN) 4 mg tablet Take one tablet by mouth every 8 hours.   ? pantoprazole DR (PROTONIX) 40 mg tablet Take one tablet by mouth daily.   ? polyethylene glycol 3350 (MIRALAX) 17 gram/dose powder Take seventeen g by mouth twice daily.   ? promethazine (PHENERGAN) 25 mg tablet Take one tablet by mouth every 6 hours as needed for Nausea or Vomiting.   ? triamcinolone acetonide (KENALOG) 0.1 % topical ointment Apply  topically to affected area twice daily.   ? vedolizumab (ENTYVIO IV) Administer  through vein every 8 weeks.       Vitals:    12/27/20 1422   BP: 138/89   Pulse: 68         Physical Exam  Vitals and nursing note reviewed.   Constitutional:       General: She is not in acute distress.     Appearance: Normal appearance. She is not ill-appearing.   HENT:      Head: Normocephalic and atraumatic.      Nose: Nose normal.   Eyes:      Conjunctiva/sclera: Conjunctivae normal.   Neurological:      Mental Status: She is alert and oriented to person, place, and time.   Psychiatric:         Mood and Affect: Mood normal.         Behavior: Behavior normal.         Thought Content: Thought content normal.         Judgment: Judgment normal.  Diabetic Foot Exam       Bilateral vascular, sensation, integument are normal:  Yes except dry skin    Labs:   Comprehensive Metabolic Profile    Lab Results   Component Value Date/Time    NA 141 12/22/2020 02:12 PM    K 4.1 12/22/2020 02:12 PM    CL 104 12/22/2020 02:12 PM    CO2 24 12/22/2020 02:12 PM    GAP 13 (H) 12/22/2020 02:12 PM    BUN 26 (H) 12/22/2020 02:12 PM    CR 1.43 (H) 12/22/2020 02:12 PM    GLU 108 (H) 12/22/2020 02:12 PM    Lab Results   Component Value Date/Time    CA 9.6 12/22/2020 02:12 PM    PO4 3.4 11/03/2020 11:07 AM    ALBUMIN 4.0 12/22/2020 02:12 PM    TOTPROT 6.5 12/22/2020 02:12 PM    ALKPHOS 147 (H) 12/22/2020 02:12 PM    AST 14 12/22/2020 02:12 PM    ALT 15 12/22/2020 02:12 PM    TOTBILI 0.3 12/22/2020 02:12 PM    EGFR1 44 (L) 12/22/2020 02:12 PM        No results found for: MCALB24   Microalbumin/CR ratio Urine   Date Value Ref Range Status   09/19/2020 30.81 (H) <30 ug/mg Final     Comment:     NOTE NEW REFERENCE RANGES     Microalbumin, Random   Date Value Ref Range Status   09/19/2020 64.7 MCG/ML Final     Lab Results   Component Value Date    CHOL 119 09/19/2020    TRIG 173 (H) 09/19/2020    HDL 30 (L) 09/19/2020    LDL 66 09/19/2020    VLDL 35 09/19/2020    NONHDLCHOL 89 09/19/2020      TSH   Date Value Ref Range Status   11/03/2020 3.23 0.35 - 5.00 MCU/ML Final       Hemoglobin A1C   Date Value Ref Range Status   10/14/2019 6.8 (H) 4.0 - 6.0 % Final     Comment:     The ADA recommends that most patients with type 1 and type 2 diabetes maintain   an A1c level <7%.     11/16/2018 9.1 (H) 4.0 - 6.0 % Final     Comment:     The ADA recommends that most patients with type 1 and type 2 diabetes maintain   an A1c level <7%.     10/24/2015 9.3 (H) 4.0 - 6.0 % Final     Comment:     The ADA recommends that most patients with type 1 and type 2 diabetes maintain   an A1c level <7%.       Poc Hemoglobin A1C   Date Value Ref Range Status   09/05/2020 6.4 (A) 4 - 6 % Final   03/26/2019 7.5 (H) 4 - 6 % Final   03/27/2018 9.2 % Final     Lab Results   Component Value Date    PLTCT 251 12/22/2020        Assessment and Plan:  Diabetes mellitus type 2,  controlled  A1C 6.4%, time in range on CGM at goal   ? Target A1c <7% without significant hypoglycemia  ? Complicated by: Nephropathy, poorly healing wounds, pancreatitis   ? On Lantus 30 units daily, Novolog 25 units with meals, Farxiga 5 mg daily   ?  Plan:  ? Increase Lantus to 32 units daily-- adjust based on treat  to target with fasting goal 80-130.   ? Continue novolog and farxiga   ? She did have a yeast infection on farxiga-- alert me to further yeast infections.   ? Discussed patient's BMI with her.  The body mass index is 36.97 kg/m?Marland Kitchen and falls within the category of Obesity 2 (35 to <40); BMI plan is in progress and plans to follow up with dietitian. Encouraged her on weight loss so far.    ? Will continue to avoid GLP1 due to hx of pancreatitis.   ? Discussed rule of 15 and how to tx hypoglycemia  ? Reminded pt to rotate insulin sites    Diabetic complications - prevention and management:  - Annual labs (electrolytes and renal function): Last done 08/2020  - Annual eye exam: Last done 09/2020.  Retinopathy - none  - Hyperlipidemia: hypertriglyceridemia. Lipid panel: Last done 08/2020         - Statin: yes         - Discussed dietary modifications (low cholesterol)  - Nephropathy: yes, CKD 3. Annual Urine microalbumin/Cr: 08/2020, slightly elevated.   - Foot exam / monofilament exam (recommended annually): Last done today         - Discussed foot care.   - Diabetic Educator and/or nutritionist visit (recommended annually): scheduled with Pattie Leuyot RD [facility]     RTC in 3 months     Patient Instructions   Our plan:   - Increase Lantus to 32 units       Avi Archuleta Antonieta Loveless, PA-C                             45 minutes spent on this patient's encounter with counseling and coordination of care taking >50% of the visit.

## 2021-01-04 ENCOUNTER — Encounter: Admit: 2021-01-04 | Discharge: 2021-01-04 | Payer: BC Managed Care – PPO

## 2021-01-04 MED ORDER — DEXLANSOPRAZOLE 60 MG PO CPDB
60 mg | ORAL_CAPSULE | Freq: Every day | ORAL | 1 refills | 30.00000 days | Status: AC
Start: 2021-01-04 — End: ?

## 2021-01-04 MED ORDER — DEXLANSOPRAZOLE 60 MG PO CPDB
ORAL_CAPSULE | Freq: Every day | ORAL | 1 refills | 30.00000 days | Status: DC
Start: 2021-01-04 — End: 2021-01-04

## 2021-01-04 NOTE — Patient Instructions
-   If you have any questions regarding your appointment or any concerns, please contact  , RN at [phone].    - If you need to schedule or reschedule an appointment, please call [phone].     -For refills on medications, please have your pharmacy fax a refill authorization request form to our office at [phone]. Please allow at least 3 business days for refill requests.    - For any urgent issues after business hours, please call [phone] and have the on call pulmonary physician paged.

## 2021-01-05 ENCOUNTER — Encounter: Admit: 2021-01-05 | Discharge: 2021-01-05 | Payer: BC Managed Care – PPO

## 2021-01-05 DIAGNOSIS — R609 Edema, unspecified: Secondary | ICD-10-CM

## 2021-01-05 DIAGNOSIS — F324 Major depressive disorder, single episode, in partial remission: Secondary | ICD-10-CM

## 2021-01-05 MED ORDER — BUPROPION XL 300 MG PO TB24
300 mg | ORAL_TABLET | Freq: Every morning | ORAL | 1 refills | Status: AC
Start: 2021-01-05 — End: ?

## 2021-01-05 MED ORDER — BUMETANIDE 1 MG PO TAB
ORAL_TABLET | Freq: Every day | 1 refills
Start: 2021-01-05 — End: ?

## 2021-01-08 ENCOUNTER — Encounter: Admit: 2021-01-08 | Discharge: 2021-01-08 | Payer: BC Managed Care – PPO

## 2021-01-08 ENCOUNTER — Ambulatory Visit: Admit: 2021-01-08 | Discharge: 2021-01-08 | Payer: BC Managed Care – PPO

## 2021-01-08 DIAGNOSIS — K7581 Nonalcoholic steatohepatitis (NASH): Secondary | ICD-10-CM

## 2021-01-08 DIAGNOSIS — K639 Disease of intestine, unspecified: Secondary | ICD-10-CM

## 2021-01-08 DIAGNOSIS — Z8619 Personal history of other infectious and parasitic diseases: Secondary | ICD-10-CM

## 2021-01-08 DIAGNOSIS — G40909 Epilepsy, unspecified, not intractable, without status epilepticus: Secondary | ICD-10-CM

## 2021-01-08 DIAGNOSIS — IMO0002 Ulcer: Secondary | ICD-10-CM

## 2021-01-08 DIAGNOSIS — K859 Acute pancreatitis without necrosis or infection, unspecified: Secondary | ICD-10-CM

## 2021-01-08 DIAGNOSIS — D4959 Neoplasm of unspecified behavior of other genitourinary organ: Secondary | ICD-10-CM

## 2021-01-08 DIAGNOSIS — J302 Other seasonal allergic rhinitis: Secondary | ICD-10-CM

## 2021-01-08 DIAGNOSIS — E119 Type 2 diabetes mellitus without complications: Secondary | ICD-10-CM

## 2021-01-08 DIAGNOSIS — E785 Hyperlipidemia, unspecified: Secondary | ICD-10-CM

## 2021-01-08 DIAGNOSIS — I8290 Acute embolism and thrombosis of unspecified vein: Secondary | ICD-10-CM

## 2021-01-08 DIAGNOSIS — N183 CKD (chronic kidney disease) stage 3, GFR 30-59 ml/min (HCC): Secondary | ICD-10-CM

## 2021-01-08 DIAGNOSIS — K56609 Unspecified intestinal obstruction, unspecified as to partial versus complete obstruction: Secondary | ICD-10-CM

## 2021-01-08 DIAGNOSIS — K219 Gastro-esophageal reflux disease without esophagitis: Secondary | ICD-10-CM

## 2021-01-08 DIAGNOSIS — I82409 Acute embolism and thrombosis of unspecified deep veins of unspecified lower extremity: Secondary | ICD-10-CM

## 2021-01-08 DIAGNOSIS — K5090 Crohn's disease, unspecified, without complications: Secondary | ICD-10-CM

## 2021-01-08 DIAGNOSIS — I10 Essential (primary) hypertension: Secondary | ICD-10-CM

## 2021-01-08 DIAGNOSIS — Z789 Other specified health status: Secondary | ICD-10-CM

## 2021-01-08 DIAGNOSIS — D699 Hemorrhagic condition, unspecified: Secondary | ICD-10-CM

## 2021-01-08 DIAGNOSIS — M797 Fibromyalgia: Secondary | ICD-10-CM

## 2021-01-08 DIAGNOSIS — R42 Dizziness and giddiness: Secondary | ICD-10-CM

## 2021-01-08 DIAGNOSIS — E139 Other specified diabetes mellitus without complications: Secondary | ICD-10-CM

## 2021-01-08 DIAGNOSIS — G43909 Migraine, unspecified, not intractable, without status migrainosus: Secondary | ICD-10-CM

## 2021-01-08 MED ORDER — AMITRIPTYLINE 25 MG PO TAB
25 mg | ORAL_TABLET | Freq: Every evening | ORAL | 3 refills | Status: AC
Start: 2021-01-08 — End: ?

## 2021-01-09 ENCOUNTER — Encounter: Admit: 2021-01-09 | Discharge: 2021-01-09 | Payer: BC Managed Care – PPO

## 2021-01-09 DIAGNOSIS — IMO0002 Ulcer: Secondary | ICD-10-CM

## 2021-01-09 DIAGNOSIS — G43909 Migraine, unspecified, not intractable, without status migrainosus: Secondary | ICD-10-CM

## 2021-01-09 DIAGNOSIS — K5090 Crohn's disease, unspecified, without complications: Secondary | ICD-10-CM

## 2021-01-09 DIAGNOSIS — E119 Type 2 diabetes mellitus without complications: Secondary | ICD-10-CM

## 2021-01-09 DIAGNOSIS — I8290 Acute embolism and thrombosis of unspecified vein: Secondary | ICD-10-CM

## 2021-01-09 DIAGNOSIS — K56609 Unspecified intestinal obstruction, unspecified as to partial versus complete obstruction: Secondary | ICD-10-CM

## 2021-01-09 DIAGNOSIS — Z8619 Personal history of other infectious and parasitic diseases: Secondary | ICD-10-CM

## 2021-01-09 DIAGNOSIS — E139 Other specified diabetes mellitus without complications: Secondary | ICD-10-CM

## 2021-01-09 DIAGNOSIS — D699 Hemorrhagic condition, unspecified: Secondary | ICD-10-CM

## 2021-01-09 DIAGNOSIS — D4959 Neoplasm of unspecified behavior of other genitourinary organ: Secondary | ICD-10-CM

## 2021-01-09 DIAGNOSIS — J302 Other seasonal allergic rhinitis: Secondary | ICD-10-CM

## 2021-01-09 DIAGNOSIS — N183 CKD (chronic kidney disease) stage 3, GFR 30-59 ml/min (HCC): Secondary | ICD-10-CM

## 2021-01-09 DIAGNOSIS — K219 Gastro-esophageal reflux disease without esophagitis: Secondary | ICD-10-CM

## 2021-01-09 DIAGNOSIS — K7581 Nonalcoholic steatohepatitis (NASH): Secondary | ICD-10-CM

## 2021-01-09 DIAGNOSIS — K639 Disease of intestine, unspecified: Secondary | ICD-10-CM

## 2021-01-09 DIAGNOSIS — I82409 Acute embolism and thrombosis of unspecified deep veins of unspecified lower extremity: Secondary | ICD-10-CM

## 2021-01-09 DIAGNOSIS — G40909 Epilepsy, unspecified, not intractable, without status epilepticus: Secondary | ICD-10-CM

## 2021-01-09 DIAGNOSIS — M797 Fibromyalgia: Secondary | ICD-10-CM

## 2021-01-09 DIAGNOSIS — E785 Hyperlipidemia, unspecified: Secondary | ICD-10-CM

## 2021-01-09 DIAGNOSIS — Z789 Other specified health status: Secondary | ICD-10-CM

## 2021-01-09 DIAGNOSIS — I10 Essential (primary) hypertension: Secondary | ICD-10-CM

## 2021-01-09 DIAGNOSIS — R42 Dizziness and giddiness: Secondary | ICD-10-CM

## 2021-01-09 DIAGNOSIS — K859 Acute pancreatitis without necrosis or infection, unspecified: Secondary | ICD-10-CM

## 2021-01-11 ENCOUNTER — Encounter: Admit: 2021-01-11 | Discharge: 2021-01-11 | Payer: BC Managed Care – PPO

## 2021-01-12 ENCOUNTER — Encounter: Admit: 2021-01-12 | Discharge: 2021-01-12 | Payer: BC Managed Care – PPO

## 2021-01-12 DIAGNOSIS — E1165 Type 2 diabetes mellitus with hyperglycemia: Secondary | ICD-10-CM

## 2021-01-12 MED ORDER — PEN NEEDLE, DIABETIC 32 GAUGE X 5/32" MISC NDLE
1 | Freq: Before meals | 3 refills | 30.00000 days | Status: AC
Start: 2021-01-12 — End: ?

## 2021-01-14 ENCOUNTER — Encounter: Admit: 2021-01-14 | Discharge: 2021-01-14 | Payer: BC Managed Care – PPO

## 2021-01-14 DIAGNOSIS — K21 Gastroesophageal reflux disease with esophagitis without hemorrhage: Secondary | ICD-10-CM

## 2021-01-14 MED ORDER — DEXLANSOPRAZOLE 60 MG PO CPDB
60 mg | ORAL_CAPSULE | Freq: Every day | ORAL | 3 refills | 30.00000 days | Status: AC
Start: 2021-01-14 — End: ?

## 2021-01-15 ENCOUNTER — Encounter: Admit: 2021-01-15 | Discharge: 2021-01-15 | Payer: BC Managed Care – PPO

## 2021-01-15 NOTE — Telephone Encounter
-----   Message from Carlisle Beers, PA-C sent at 01/12/2021 10:38 AM CDT -----  Regarding: To schedule with Vicente Males  Could you schedule this lady with Vicente Males?

## 2021-01-29 ENCOUNTER — Encounter: Admit: 2021-01-29 | Discharge: 2021-01-29 | Payer: BC Managed Care – PPO

## 2021-01-29 NOTE — Telephone Encounter
[facility] fax 01/22/2021 Dexlansoprazolet approval 01/22/2021 to 01/22/2022.

## 2021-02-09 ENCOUNTER — Encounter: Admit: 2021-02-09 | Discharge: 2021-02-09 | Payer: BC Managed Care – PPO

## 2021-02-16 ENCOUNTER — Encounter: Admit: 2021-02-16 | Discharge: 2021-02-16 | Payer: BC Managed Care – PPO

## 2021-02-16 ENCOUNTER — Ambulatory Visit: Admit: 2021-02-16 | Discharge: 2021-02-16 | Payer: BC Managed Care – PPO

## 2021-02-16 DIAGNOSIS — K50918 Crohn's disease, unspecified, with other complication: Principal | ICD-10-CM

## 2021-02-16 MED ORDER — DIPHENHYDRAMINE HCL 50 MG/ML IJ SOLN
25 mg | Freq: Once | INTRAVENOUS | 0 refills | Status: CP | PRN
Start: 2021-02-16 — End: ?
  Administered 2021-02-16: 20:00:00 25 mg via INTRAVENOUS

## 2021-02-16 MED ORDER — ACETAMINOPHEN 500 MG PO TAB
500 mg | Freq: Once | ORAL | 0 refills
Start: 2021-02-16 — End: ?

## 2021-02-16 MED ORDER — DIPHENHYDRAMINE HCL 50 MG/ML IJ SOLN
25 mg | Freq: Once | INTRAVENOUS | 0 refills
Start: 2021-02-16 — End: ?

## 2021-02-16 MED ORDER — DIPHENHYDRAMINE HCL 50 MG/ML IJ SOLN
25 mg | Freq: Once | INTRAVENOUS | 0 refills | Status: CP
Start: 2021-02-16 — End: ?
  Administered 2021-02-16: 19:00:00 25 mg via INTRAVENOUS

## 2021-02-16 MED ORDER — VEDOLIZUMAB IVPB
300 mg | Freq: Once | INTRAVENOUS | 0 refills | Status: CP
Start: 2021-02-16 — End: ?
  Administered 2021-02-16 (×2): 300 mg via INTRAVENOUS

## 2021-02-16 MED ORDER — ACETAMINOPHEN 500 MG PO TAB
500 mg | Freq: Once | ORAL | 0 refills | Status: CP
Start: 2021-02-16 — End: ?
  Administered 2021-02-16: 19:00:00 500 mg via ORAL

## 2021-02-16 MED ORDER — VEDOLIZUMAB IVPB
300 mg | Freq: Once | INTRAVENOUS | 0 refills
Start: 2021-02-16 — End: ?

## 2021-03-02 ENCOUNTER — Encounter: Admit: 2021-03-02 | Discharge: 2021-03-02 | Payer: BC Managed Care – PPO

## 2021-03-05 ENCOUNTER — Encounter: Admit: 2021-03-05 | Discharge: 2021-03-05 | Payer: BC Managed Care – PPO

## 2021-03-07 ENCOUNTER — Encounter: Admit: 2021-03-07 | Discharge: 2021-03-07 | Payer: BC Managed Care – PPO

## 2021-03-23 ENCOUNTER — Encounter: Admit: 2021-03-23 | Discharge: 2021-03-23 | Payer: BC Managed Care – PPO

## 2021-03-30 ENCOUNTER — Encounter: Admit: 2021-03-30 | Discharge: 2021-03-30 | Payer: BC Managed Care – PPO

## 2021-03-30 ENCOUNTER — Ambulatory Visit: Admit: 2021-03-30 | Discharge: 2021-03-30 | Payer: BC Managed Care – PPO

## 2021-03-30 DIAGNOSIS — N183 CKD (chronic kidney disease) stage 3, GFR 30-59 ml/min (HCC): Secondary | ICD-10-CM

## 2021-03-30 DIAGNOSIS — G43909 Migraine, unspecified, not intractable, without status migrainosus: Secondary | ICD-10-CM

## 2021-03-30 DIAGNOSIS — D4959 Neoplasm of unspecified behavior of other genitourinary organ: Secondary | ICD-10-CM

## 2021-03-30 DIAGNOSIS — H66001 Acute suppurative otitis media without spontaneous rupture of ear drum, right ear: Secondary | ICD-10-CM

## 2021-03-30 DIAGNOSIS — K7581 Nonalcoholic steatohepatitis (NASH): Secondary | ICD-10-CM

## 2021-03-30 DIAGNOSIS — J302 Other seasonal allergic rhinitis: Secondary | ICD-10-CM

## 2021-03-30 DIAGNOSIS — I82409 Acute embolism and thrombosis of unspecified deep veins of unspecified lower extremity: Secondary | ICD-10-CM

## 2021-03-30 DIAGNOSIS — K56609 Unspecified intestinal obstruction, unspecified as to partial versus complete obstruction: Secondary | ICD-10-CM

## 2021-03-30 DIAGNOSIS — D699 Hemorrhagic condition, unspecified: Secondary | ICD-10-CM

## 2021-03-30 DIAGNOSIS — M797 Fibromyalgia: Secondary | ICD-10-CM

## 2021-03-30 DIAGNOSIS — K5090 Crohn's disease, unspecified, without complications: Secondary | ICD-10-CM

## 2021-03-30 DIAGNOSIS — R051 Acute cough: Secondary | ICD-10-CM

## 2021-03-30 DIAGNOSIS — I10 Essential (primary) hypertension: Secondary | ICD-10-CM

## 2021-03-30 DIAGNOSIS — E785 Hyperlipidemia, unspecified: Secondary | ICD-10-CM

## 2021-03-30 DIAGNOSIS — I8290 Acute embolism and thrombosis of unspecified vein: Secondary | ICD-10-CM

## 2021-03-30 DIAGNOSIS — IMO0002 Ulcer: Secondary | ICD-10-CM

## 2021-03-30 DIAGNOSIS — J069 Acute upper respiratory infection, unspecified: Secondary | ICD-10-CM

## 2021-03-30 DIAGNOSIS — R42 Dizziness and giddiness: Secondary | ICD-10-CM

## 2021-03-30 DIAGNOSIS — Z8619 Personal history of other infectious and parasitic diseases: Secondary | ICD-10-CM

## 2021-03-30 DIAGNOSIS — E119 Type 2 diabetes mellitus without complications: Secondary | ICD-10-CM

## 2021-03-30 DIAGNOSIS — K219 Gastro-esophageal reflux disease without esophagitis: Secondary | ICD-10-CM

## 2021-03-30 DIAGNOSIS — E139 Other specified diabetes mellitus without complications: Secondary | ICD-10-CM

## 2021-03-30 DIAGNOSIS — Z789 Other specified health status: Secondary | ICD-10-CM

## 2021-03-30 DIAGNOSIS — J029 Acute pharyngitis, unspecified: Secondary | ICD-10-CM

## 2021-03-30 DIAGNOSIS — K639 Disease of intestine, unspecified: Secondary | ICD-10-CM

## 2021-03-30 DIAGNOSIS — K859 Acute pancreatitis without necrosis or infection, unspecified: Secondary | ICD-10-CM

## 2021-03-30 DIAGNOSIS — G40909 Epilepsy, unspecified, not intractable, without status epilepticus: Secondary | ICD-10-CM

## 2021-03-30 MED ORDER — AMOXICILLIN-POT CLAVULANATE 875-125 MG PO TAB
1 | ORAL_TABLET | Freq: Two times a day (BID) | ORAL | 0 refills | 7.00000 days | Status: AC
Start: 2021-03-30 — End: ?

## 2021-03-30 NOTE — Progress Notes
Date of Service: 03/30/2021    Tina Snyder is a 55 y.o. female.  DOB: 1965/08/06  MRN: [id number]     Subjective:             Chief Complaint   Patient presents with   ? Cough     X 3 days   ? Chest Tightness     X 3 days     History of Present Illness:    Tina Snyder is a pleasant 55 y.o. female who presents today with cough and pleuritic chest pain x 3 days.Was recently exposed to Influenza A by niece. Has been taking mucinex sinus and has helped mildly. Is still eating and drinking ok and using the restroom in a normal fashion. Is not currently vaccinated for this season's influenza but is vaccinated against COVID. Hs taken 2 at home COVID test on 03/28/2021 and 03/29/2021 and they were both negative.        Review of Systems   Constitutional: Positive for chills and fatigue.   HENT: Positive for ear pain and postnasal drip. Negative for ear discharge.    Respiratory: Positive for cough and chest tightness. Negative for wheezing and stridor.    Gastrointestinal: Negative.      Results for orders placed or performed in visit on 03/30/21 (from the past 336 hour(s))   POC RAPID INFLUENZA ASSAY W/OPTIC   Result Value Ref Range    Influenza A Negative Negative, Invalid    Influenza B Negative Negative, Invalid    POC Flu Quality Control Pass      *Note: Due to a large number of results and/or encounters for the requested time period, some results have not been displayed. A complete set of results can be found in Results Review.        Allergies   Allergen Reactions   ? Bee Venom Protein (Honey Bee) ANAPHYLAXIS   ? Morphine HALLUCINATIONS     States she quits breathing     ? Bee [Bumble Bee] UNKNOWN   ? Ciprofloxacin NAUSEA AND VOMITING   ? Compazine [Prochlorperazine] HIVES and SEE COMMENTS     Tolerates Phenergan   ? Flagyl [Metronidazole] HIVES   ? Reglan [Metoclopramide] SEE COMMENTS     Patient states that she cannot tolerate.   ? Stadol [Butorphanol Tartrate] SEE COMMENTS     Patient states that it made her loopy and drunk.   ? Sulfa (Sulfonamide Antibiotics) HIVES and NAUSEA AND VOMITING   ? Tysabri [Natalizumab] SEE COMMENTS     JC virus positive.  Do not use Tysabri - high risk for PML   ? Walnut UNKNOWN   ? Zyrtec [Cetirizine] UNKNOWN   ? Coumadin [Warfarin] SEE COMMENTS     Hemorrhage   ? Lovenox [Enoxaparin] SEE COMMENTS     Hemorrhage   ? Pecan Nut UNKNOWN      ? amitriptyline (ELAVIL) 25 mg tablet Take one tablet by mouth at bedtime daily.   ? amoxicillin-potassium clavulanate (AUGMENTIN) 875/125 mg tablet Take one tablet by mouth every 12 hours for 7 days. Take with food.  Indications: eye/ear/nose/throat infection   ? atorvastatin (LIPITOR) 40 mg tablet TAKE 1 TABLET BY MOUTH EVERY DAY   ? AUVI-Q 0.3 mg/0.3 mL auto-injector Inject 0.3 mg (1 Pen) into thigh if needed for anaphylactic reaction. May repeat in 5-15 minutes if needed.   ? Blood-Glucose Sensor (DEXCOM G6 SENSOR) devi Use 1 each as directed as directed. Change every 10 days   ?  budesonide (ENTOCORT EC) 3 mg capsule TAKE 2 CAPSULES BY MOUTH DAILY.   ? buPROPion XL (WELLBUTRIN XL) 300 mg tablet TAKE ONE TABLET BY MOUTH EVERY MORNING. DO NOT CRUSH OR CHEW.   ? dapagliflozin (FARXIGA) 5 mg tablet Take one tablet by mouth daily.   ? DEXCOM G6 RECEIVER misc Use 1 each as directed daily.   ? DEXCOM G6 TRANSMITTER devi Use 1 each as directed as directed. Every 90 days   ? dexlansoprazole (DEXILANT) 60 mg capsule Take one capsule by mouth daily. Indications: gastroesophageal reflux disease   ? diclofenac sodium (VOLTAREN) 1 % topical gel Apply four g topically to affected area four times daily.   ? duloxetine DR (CYMBALTA) 30 mg capsule TAKE 1 CAPSULE BY MOUTH EVERY DAY   ? famotidine (PEPCID) 20 mg tablet TAKE ONE TABLET BY MOUTH AT BEDTIME DAILY.   ? glucagon (BAQSIMI) 3 mg/actuation nasal spray Use 1 spray in single nostril. If no response, may repeat in 15 minutes using second device.   ? icosapent ethyL (VASCEPA) 1 gram capsule Take two capsules by mouth twice daily with meals.   ? insulin aspart (U-100) (NOVOLOG FLEXPEN U-100 INSULIN) 100 unit/mL (3 mL) PEN Inject 20 units with meals plus correction. Max 80 units/day.   ? insulin glargine (BASAGLAR KWIKPEN U-100 INSULIN) 100 unit/mL (3 mL) subcutaneous PEN Inject thirty two Units under the skin at bedtime daily.   ? montelukast (SINGULAIR) 10 mg tablet Take one tablet by mouth at bedtime daily.   ? nebivolol (BYSTOLIC) 10 mg tablet Take one tablet by mouth daily.   ? ondansetron HCL (ZOFRAN) 4 mg tablet Take one tablet by mouth every 8 hours.   ? pantoprazole DR (PROTONIX) 40 mg tablet Take one tablet by mouth daily.   ? pen needle, diabetic (BD NANO 2ND GEN PEN NEEDLE) 32 gauge x 5/32 pen needle Use one each as directed before meals and at bedtime. Use with insulin injections.   ? polyethylene glycol 3350 (MIRALAX) 17 gram/dose powder Take seventeen g by mouth twice daily.   ? promethazine (PHENERGAN) 25 mg tablet Take one tablet by mouth every 6 hours as needed for Nausea or Vomiting.   ? triamcinolone acetonide (KENALOG) 0.1 % topical ointment Apply  topically to affected area twice daily.   ? vedolizumab (ENTYVIO IV) Administer  through vein every 8 weeks.       Medical History:   Diagnosis Date   ? Bleeding tendency (HCC)    ? 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 Bowel disease    ? CKD (chronic kidney disease) stage 3, GFR 30-59 ml/min (HCC)    ? Crohn disease (HCC)    ? Deep vein thrombosis (DVT) (HCC)    ? Difficult intravenous access    ? Dizziness    ? DM (diabetes mellitus), secondary-steroid-induced 2012   ? Fibromyalgia    ? GERD (gastroesophageal reflux disease)    ? H/O Mycobacterium avium intracellulare infection    ? Hyperlipidemia    ? Hypertension    ? Hypertensive heart disease    ? Migraines    ? Pancreatitis    ? Seasonal allergic reaction    ? Seizure disorder (HCC)    ? Small bowel obstruction (HCC)    ? Steatohepatitis     felt secondary to medications   ? Type 2 diabetes mellitus without complication, with long-term current use of insulin (HCC) 10/22/2017   ? Ulcer    ?  Vaginal tumor      Surgical History:   Procedure Laterality Date   ? HX ADENOIDECTOMY  05/1995   ? LUNG SURGERY  10/22/2010    lung biopsy   ? LIVER BIOPSY  12/2010   ? ESOPHAGOGASTRODUODENOSCOPY N/A 03/13/2015    Performed by Elyn Aquas, MD at [facility] ENDO   ? COLONOSCOPY N/A 03/13/2015    Performed by Elyn Aquas, MD at [facility] ENDO   ? ESOPHAGOGASTRODUODENOSCOPY BIOPSY  03/13/2015    Performed by Elyn Aquas, MD at [facility] ENDO   ? COLONOSCOPY BIOPSY  03/13/2015    Performed by Elyn Aquas, MD at [facility] ENDO   ? ESOPHAGOGASTRODUODENOSCOPY WITH SPECIMEN COLLECTION BY BRUSHING/ WASHING N/A 06/30/2017    Performed by Jolee Ewing, MD at [facility] ENDO   ? COLONOSCOPY DIAGNOSTIC WITH SPECIMEN COLLECTION BY BRUSHING/ WASHING - FLEXIBLE N/A 06/30/2017    Performed by Jolee Ewing, MD at [facility] ENDO   ? ESOPHAGOGASTRODUODENOSCOPY WITH BIOPSY - FLEXIBLE N/A 06/30/2017    Performed by Jolee Ewing, MD at [facility] ENDO   ? COLONOSCOPY WITH BIOPSY - FLEXIBLE  06/30/2017    Performed by Jolee Ewing, MD at [facility] ENDO   ? COLONOSCOPY DIAGNOSTIC WITH SPECIMEN COLLECTION BY BRUSHING/ WASHING - FLEXIBLE N/A 11/11/2018    Performed by Tommie Sams, MD at [facility] ENDO   ? ESOPHAGOGASTRODUODENOSCOPY WITH SPECIMEN COLLECTION BY BRUSHING/ WASHING N/A 11/11/2018    Performed by Tommie Sams, MD at [facility] ENDO   ? ESOPHAGOGASTRODUODENOSCOPY WITH BIOPSY - FLEXIBLE  11/11/2018    Performed by Tommie Sams, MD at [facility] ENDO   ? COLONOSCOPY DIAGNOSTIC WITH SPECIMEN COLLECTION BY BRUSHING/ WASHING - FLEXIBLE N/A 11/12/2018    Performed by Veneta Penton, MD at [facility] ENDO   ? COLONOSCOPY WITH BIOPSY - FLEXIBLE  11/12/2018    Performed by Veneta Penton, MD at [facility] ENDO   ? ESOPHAGOGASTRODUODENOSCOPY WITH ENDOSCOPIC ULTRASOUND EXAMINATION - FLEXIBLE N/A 03/01/2020    Performed by Comer Locket, MD at [facility] ENDO   ? APPENDECTOMY     ? BRONCHOSCOPY     ? CHOLECYSTECTOMY ? GASTRIC FUNDOPLICATION     ? HEMORRHOIDECTOMY     ? HX BREAST LUMPECTOMY     ? HX ENDOSCOPY      colonoscopy 2014   ? HX HYSTERECTOMY      partial 02/1995, ovaries removed 1999   ? OOPHORECTOMY      1999 ovaries removed   ? SMALL BOWEL RESECTION     ? SUBTOTAL COLECTOMY      Ileocolectomy      Family History   Problem Relation Age of Onset   ? Celiac Disease Son    ? Asthma Son    ? Hypertension Mother    ? Depression Mother    ? Basal Cell Carcinoma Mother    ? Crohn's Disease Mother    ? Diabetes Mother    ? Dementia Mother    ? Hypertension Father    ? High Cholesterol Father    ? Depression Father    ? Liver Disease Father    ? Kidney Failure Father    ? Alcohol abuse Father    ? Hypertension Maternal Grandmother    ? Stroke Maternal Grandmother    ? Heart Failure Maternal Grandfather    ? Stroke Maternal Grandfather    ? Migraines Maternal Grandfather    ? Cancer Paternal Grandmother         breast   ? Hypertension Paternal Grandmother    ?  High Cholesterol Paternal Grandmother    ? Depression Paternal Grandmother    ? Heart Failure Paternal Grandfather    ? Hypertension Paternal Grandfather    ? Blood Clots Neg Hx    ? COPD Neg Hx    ? Coronary Artery Disease Neg Hx    ? Cystic Fibrosis Neg Hx    ? DVT Neg Hx    ? Pulmonary Embolism Neg Hx    ? Pulmonary Fibrosis Neg Hx    ? Pulmonary HTN Neg Hx    ? Melanoma Neg Hx      Social History     Socioeconomic History   ? Marital status: Married     Spouse name: Susy Frizzle   ? Number of children: 2   Occupational History   ? 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 Smoking status: Never   ? Smokeless tobacco: Never   Vaping Use   ? Vaping Use: Never used   Substance and Sexual Activity   ? Alcohol use: No     Alcohol/week: 0.0 standard drinks   ? 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    ?                Objective:           Vitals:    03/30/21 1609 03/30/21 1711   BP: (!) 166/101 131/83   Pulse: 71 93   Temp: 36.7 ?C (98.1 ?F)    Resp: 16    SpO2: 97%    TempSrc: Oral    Weight: 96.2 kg (212 lb)    Height: 162.6 cm (5' 4)      Body mass index is 36.39 kg/m?Marland Kitchen     Physical Exam  Constitutional:       Appearance: Normal appearance. She is obese.   HENT:      Head: Normocephalic.      Right Ear: Hearing normal. A middle ear effusion is present. Tympanic membrane is injected and erythematous.      Left Ear: Hearing normal. A middle ear effusion is present.      Ears:        Nose:      Right Turbinates: Not enlarged or swollen.      Left Turbinates: Not enlarged or swollen.      Right Sinus: No maxillary sinus tenderness or frontal sinus tenderness.      Left Sinus: No maxillary sinus tenderness or frontal sinus tenderness.      Mouth/Throat:      Mouth: Mucous membranes are moist.      Pharynx: Oropharynx is clear. Uvula midline.   Neurological:      Mental Status: She is alert.                    Assessment and Plan:    Ciria L. Jetter was seen today for cough and chest tightness.    Diagnoses and all orders for this visit:    Non-recurrent acute suppurative otitis media of right ear without spontaneous rupture of tympanic membrane  -     amoxicillin-potassium clavulanate (AUGMENTIN) 875/125 mg tablet; Take  one tablet by mouth every 12 hours for 7 days. Take with food.  Indications: eye/ear/nose/throat infection    Viral URI  -     POC RAPID INFLUENZA ASSAY W/OPTIC    Acute cough  -     POC RAPID INFLUENZA ASSAY W/OPTIC    Sore throat  -     POC RAPID INFLUENZA ASSAY W/OPTIC    Please follow all other at home interventions discussed with you today and provided to you via educational handouts.     Patient Instructions   Randie Heinz to meet you today, if you continue to have issues please schedule an appt. with your PCP. Take medications as directed.     Return if symptoms worsen or fail to improve.

## 2021-03-30 NOTE — Patient Instructions
Great to meet you today, if you continue to have issues please schedule an appt. with your PCP. Take medications as directed.

## 2021-04-12 ENCOUNTER — Encounter: Admit: 2021-04-12 | Discharge: 2021-04-12 | Payer: BC Managed Care – PPO

## 2021-04-20 ENCOUNTER — Encounter: Admit: 2021-04-20 | Discharge: 2021-04-20 | Payer: BC Managed Care – PPO

## 2021-04-20 ENCOUNTER — Ambulatory Visit: Admit: 2021-04-20 | Discharge: 2021-04-20 | Payer: BC Managed Care – PPO

## 2021-04-20 DIAGNOSIS — R42 Dizziness and giddiness: Secondary | ICD-10-CM

## 2021-04-20 DIAGNOSIS — K859 Acute pancreatitis without necrosis or infection, unspecified: Secondary | ICD-10-CM

## 2021-04-20 DIAGNOSIS — K7581 Nonalcoholic steatohepatitis (NASH): Secondary | ICD-10-CM

## 2021-04-20 DIAGNOSIS — M797 Fibromyalgia: Secondary | ICD-10-CM

## 2021-04-20 DIAGNOSIS — E139 Other specified diabetes mellitus without complications: Secondary | ICD-10-CM

## 2021-04-20 DIAGNOSIS — I8290 Acute embolism and thrombosis of unspecified vein: Secondary | ICD-10-CM

## 2021-04-20 DIAGNOSIS — G40909 Epilepsy, unspecified, not intractable, without status epilepticus: Secondary | ICD-10-CM

## 2021-04-20 DIAGNOSIS — I10 Essential (primary) hypertension: Secondary | ICD-10-CM

## 2021-04-20 DIAGNOSIS — G43909 Migraine, unspecified, not intractable, without status migrainosus: Secondary | ICD-10-CM

## 2021-04-20 DIAGNOSIS — E119 Type 2 diabetes mellitus without complications: Secondary | ICD-10-CM

## 2021-04-20 DIAGNOSIS — K219 Gastro-esophageal reflux disease without esophagitis: Secondary | ICD-10-CM

## 2021-04-20 DIAGNOSIS — IMO0002 Ulcer: Secondary | ICD-10-CM

## 2021-04-20 DIAGNOSIS — K56609 Unspecified intestinal obstruction, unspecified as to partial versus complete obstruction: Secondary | ICD-10-CM

## 2021-04-20 DIAGNOSIS — E785 Hyperlipidemia, unspecified: Secondary | ICD-10-CM

## 2021-04-20 DIAGNOSIS — K50918 Crohn's disease, unspecified, with other complication: Principal | ICD-10-CM

## 2021-04-20 DIAGNOSIS — J302 Other seasonal allergic rhinitis: Secondary | ICD-10-CM

## 2021-04-20 DIAGNOSIS — D699 Hemorrhagic condition, unspecified: Secondary | ICD-10-CM

## 2021-04-20 DIAGNOSIS — N183 CKD (chronic kidney disease) stage 3, GFR 30-59 ml/min (HCC): Secondary | ICD-10-CM

## 2021-04-20 DIAGNOSIS — I82409 Acute embolism and thrombosis of unspecified deep veins of unspecified lower extremity: Secondary | ICD-10-CM

## 2021-04-20 DIAGNOSIS — Z789 Other specified health status: Secondary | ICD-10-CM

## 2021-04-20 DIAGNOSIS — D4959 Neoplasm of unspecified behavior of other genitourinary organ: Secondary | ICD-10-CM

## 2021-04-20 DIAGNOSIS — Z8619 Personal history of other infectious and parasitic diseases: Secondary | ICD-10-CM

## 2021-04-20 DIAGNOSIS — K5090 Crohn's disease, unspecified, without complications: Secondary | ICD-10-CM

## 2021-04-20 DIAGNOSIS — K639 Disease of intestine, unspecified: Secondary | ICD-10-CM

## 2021-04-20 MED ORDER — ACETAMINOPHEN 500 MG PO TAB
500 mg | Freq: Once | ORAL | 0 refills
Start: 2021-04-20 — End: ?

## 2021-04-20 MED ORDER — VEDOLIZUMAB IVPB
300 mg | Freq: Once | INTRAVENOUS | 0 refills | Status: CP
Start: 2021-04-20 — End: ?
  Administered 2021-04-20 (×2): 300 mg via INTRAVENOUS

## 2021-04-20 MED ORDER — DIPHENHYDRAMINE HCL 50 MG/ML IJ SOLN
25 mg | Freq: Once | INTRAVENOUS | 0 refills | Status: CP | PRN
Start: 2021-04-20 — End: ?
  Administered 2021-04-20: 21:00:00 25 mg via INTRAVENOUS

## 2021-04-20 MED ORDER — VEDOLIZUMAB IVPB
300 mg | Freq: Once | INTRAVENOUS | 0 refills
Start: 2021-04-20 — End: ?

## 2021-04-20 MED ORDER — ACETAMINOPHEN 500 MG PO TAB
500 mg | Freq: Once | ORAL | 0 refills | Status: CP
Start: 2021-04-20 — End: ?
  Administered 2021-04-20: 21:00:00 500 mg via ORAL

## 2021-04-20 MED ORDER — DIPHENHYDRAMINE HCL 50 MG/ML IJ SOLN
25 mg | Freq: Once | INTRAVENOUS | 0 refills | Status: CP
Start: 2021-04-20 — End: ?
  Administered 2021-04-20: 21:00:00 25 mg via INTRAVENOUS

## 2021-04-20 MED ORDER — DIPHENHYDRAMINE HCL 50 MG/ML IJ SOLN
25 mg | Freq: Once | INTRAVENOUS | 0 refills
Start: 2021-04-20 — End: ?

## 2021-04-20 NOTE — Progress Notes
Pt began to itch half way through infusion.  Gave an extra dose of benadryl IV as ordered prn with relief.

## 2021-04-20 NOTE — Progress Notes
Pt tolerated the rest of her infusion.  Post infusion emergency medical treatment: Go to the closest Emergency Department or call 911. For non-urgent questions or concerns, call [phone] after 0900 the following day(including weekends & holidays). For urgent medical questions, call [phone] and ask to speak to the On-Call Physician covering for the physician prescribing your infusion medication.

## 2021-04-21 ENCOUNTER — Encounter: Admit: 2021-04-21 | Discharge: 2021-04-21 | Payer: BC Managed Care – PPO

## 2021-04-30 ENCOUNTER — Ambulatory Visit: Admit: 2021-04-30 | Discharge: 2021-04-30 | Payer: BC Managed Care – PPO

## 2021-04-30 ENCOUNTER — Encounter: Admit: 2021-04-30 | Discharge: 2021-04-30 | Payer: BC Managed Care – PPO

## 2021-04-30 DIAGNOSIS — E119 Type 2 diabetes mellitus without complications: Secondary | ICD-10-CM

## 2021-04-30 DIAGNOSIS — G43909 Migraine, unspecified, not intractable, without status migrainosus: Secondary | ICD-10-CM

## 2021-04-30 DIAGNOSIS — K5090 Crohn's disease, unspecified, without complications: Secondary | ICD-10-CM

## 2021-04-30 DIAGNOSIS — D4959 Neoplasm of unspecified behavior of other genitourinary organ: Secondary | ICD-10-CM

## 2021-04-30 DIAGNOSIS — I82409 Acute embolism and thrombosis of unspecified deep veins of unspecified lower extremity: Secondary | ICD-10-CM

## 2021-04-30 DIAGNOSIS — M797 Fibromyalgia: Secondary | ICD-10-CM

## 2021-04-30 DIAGNOSIS — J302 Other seasonal allergic rhinitis: Secondary | ICD-10-CM

## 2021-04-30 DIAGNOSIS — E139 Other specified diabetes mellitus without complications: Secondary | ICD-10-CM

## 2021-04-30 DIAGNOSIS — IMO0002 Ulcer: Secondary | ICD-10-CM

## 2021-04-30 DIAGNOSIS — K56609 Unspecified intestinal obstruction, unspecified as to partial versus complete obstruction: Secondary | ICD-10-CM

## 2021-04-30 DIAGNOSIS — N183 CKD (chronic kidney disease) stage 3, GFR 30-59 ml/min (HCC): Secondary | ICD-10-CM

## 2021-04-30 DIAGNOSIS — R42 Dizziness and giddiness: Secondary | ICD-10-CM

## 2021-04-30 DIAGNOSIS — I10 Essential (primary) hypertension: Secondary | ICD-10-CM

## 2021-04-30 DIAGNOSIS — Z8619 Personal history of other infectious and parasitic diseases: Secondary | ICD-10-CM

## 2021-04-30 DIAGNOSIS — K219 Gastro-esophageal reflux disease without esophagitis: Secondary | ICD-10-CM

## 2021-04-30 DIAGNOSIS — K639 Disease of intestine, unspecified: Secondary | ICD-10-CM

## 2021-04-30 DIAGNOSIS — K7581 Nonalcoholic steatohepatitis (NASH): Secondary | ICD-10-CM

## 2021-04-30 DIAGNOSIS — I8290 Acute embolism and thrombosis of unspecified vein: Secondary | ICD-10-CM

## 2021-04-30 DIAGNOSIS — K859 Acute pancreatitis without necrosis or infection, unspecified: Secondary | ICD-10-CM

## 2021-04-30 DIAGNOSIS — D699 Hemorrhagic condition, unspecified: Secondary | ICD-10-CM

## 2021-04-30 DIAGNOSIS — G40909 Epilepsy, unspecified, not intractable, without status epilepticus: Secondary | ICD-10-CM

## 2021-04-30 DIAGNOSIS — Z789 Other specified health status: Secondary | ICD-10-CM

## 2021-04-30 DIAGNOSIS — E785 Hyperlipidemia, unspecified: Secondary | ICD-10-CM

## 2021-04-30 MED ORDER — ESZOPICLONE 1 MG PO TAB
1 mg | ORAL_TABLET | Freq: Every evening | ORAL | 5 refills | PRN
Start: 2021-04-30 — End: ?

## 2021-04-30 NOTE — Patient Instructions
-   If you need to schedule or reschedule an appointment, please call [phone].     - For any urgent issues after business hours, please call [phone] and have the on call pulmonary physician paged.    - If you have any questions regarding your appointment or any concerns, please contact my sleep care nurse at  [phone].    -Refills on medications, please have your pharmacy fax a refill authorization request form to our office at Fax) [phone].     Please allow at least 3 business days for refill requests   =====================================================================================================

## 2021-05-01 ENCOUNTER — Encounter: Admit: 2021-05-01 | Discharge: 2021-05-01 | Payer: BC Managed Care – PPO

## 2021-05-01 DIAGNOSIS — R42 Dizziness and giddiness: Secondary | ICD-10-CM

## 2021-05-01 DIAGNOSIS — K639 Disease of intestine, unspecified: Secondary | ICD-10-CM

## 2021-05-01 DIAGNOSIS — D4959 Neoplasm of unspecified behavior of other genitourinary organ: Secondary | ICD-10-CM

## 2021-05-01 DIAGNOSIS — Z8619 Personal history of other infectious and parasitic diseases: Secondary | ICD-10-CM

## 2021-05-01 DIAGNOSIS — IMO0002 Ulcer: Secondary | ICD-10-CM

## 2021-05-01 DIAGNOSIS — G43909 Migraine, unspecified, not intractable, without status migrainosus: Secondary | ICD-10-CM

## 2021-05-01 DIAGNOSIS — K7581 Nonalcoholic steatohepatitis (NASH): Secondary | ICD-10-CM

## 2021-05-01 DIAGNOSIS — M797 Fibromyalgia: Secondary | ICD-10-CM

## 2021-05-01 DIAGNOSIS — I10 Essential (primary) hypertension: Secondary | ICD-10-CM

## 2021-05-01 DIAGNOSIS — K5090 Crohn's disease, unspecified, without complications: Secondary | ICD-10-CM

## 2021-05-01 DIAGNOSIS — G40909 Epilepsy, unspecified, not intractable, without status epilepticus: Secondary | ICD-10-CM

## 2021-05-01 DIAGNOSIS — E119 Type 2 diabetes mellitus without complications: Secondary | ICD-10-CM

## 2021-05-01 DIAGNOSIS — E139 Other specified diabetes mellitus without complications: Secondary | ICD-10-CM

## 2021-05-01 DIAGNOSIS — K56609 Unspecified intestinal obstruction, unspecified as to partial versus complete obstruction: Secondary | ICD-10-CM

## 2021-05-01 DIAGNOSIS — E785 Hyperlipidemia, unspecified: Secondary | ICD-10-CM

## 2021-05-01 DIAGNOSIS — K219 Gastro-esophageal reflux disease without esophagitis: Secondary | ICD-10-CM

## 2021-05-01 DIAGNOSIS — D699 Hemorrhagic condition, unspecified: Secondary | ICD-10-CM

## 2021-05-01 DIAGNOSIS — I82409 Acute embolism and thrombosis of unspecified deep veins of unspecified lower extremity: Secondary | ICD-10-CM

## 2021-05-01 DIAGNOSIS — Z789 Other specified health status: Secondary | ICD-10-CM

## 2021-05-01 DIAGNOSIS — K859 Acute pancreatitis without necrosis or infection, unspecified: Secondary | ICD-10-CM

## 2021-05-01 DIAGNOSIS — I8290 Acute embolism and thrombosis of unspecified vein: Secondary | ICD-10-CM

## 2021-05-01 DIAGNOSIS — N183 CKD (chronic kidney disease) stage 3, GFR 30-59 ml/min (HCC): Secondary | ICD-10-CM

## 2021-05-01 DIAGNOSIS — J302 Other seasonal allergic rhinitis: Secondary | ICD-10-CM

## 2021-05-01 MED ORDER — FAMOTIDINE 20 MG PO TAB
20 mg | ORAL_TABLET | Freq: Every evening | ORAL | 1 refills
Start: 2021-05-01 — End: ?

## 2021-05-02 ENCOUNTER — Encounter: Admit: 2021-05-02 | Discharge: 2021-05-02 | Payer: BC Managed Care – PPO

## 2021-05-02 DIAGNOSIS — K50018 Crohn's disease of small intestine with other complication: Secondary | ICD-10-CM

## 2021-05-02 DIAGNOSIS — E782 Mixed hyperlipidemia: Secondary | ICD-10-CM

## 2021-05-02 MED ORDER — ATORVASTATIN 40 MG PO TAB
ORAL_TABLET | Freq: Every day | 0 refills | Status: AC
Start: 2021-05-02 — End: ?

## 2021-05-02 MED ORDER — BUDESONIDE 3 MG PO CECX
ORAL_CAPSULE | Freq: Every day | ORAL | 1 refills | 30.00000 days | Status: AC
Start: 2021-05-02 — End: ?

## 2021-05-08 ENCOUNTER — Encounter: Admit: 2021-05-08 | Discharge: 2021-05-08 | Payer: BC Managed Care – PPO

## 2021-05-09 ENCOUNTER — Encounter: Admit: 2021-05-09 | Discharge: 2021-05-09 | Payer: BC Managed Care – PPO

## 2021-05-09 NOTE — Telephone Encounter
RN received incoming DWO from [facility] for CGM  RN filled out requested information  Given to [employer] PA-C to sign

## 2021-05-17 ENCOUNTER — Encounter: Admit: 2021-05-17 | Discharge: 2021-05-17 | Payer: BC Managed Care – PPO

## 2021-05-17 ENCOUNTER — Ambulatory Visit: Admit: 2021-05-17 | Discharge: 2021-05-18 | Payer: BC Managed Care – PPO

## 2021-05-17 DIAGNOSIS — N183 CKD (chronic kidney disease) stage 3, GFR 30-59 ml/min (HCC): Secondary | ICD-10-CM

## 2021-05-17 DIAGNOSIS — K639 Disease of intestine, unspecified: Secondary | ICD-10-CM

## 2021-05-17 DIAGNOSIS — Z789 Other specified health status: Secondary | ICD-10-CM

## 2021-05-17 DIAGNOSIS — J069 Acute upper respiratory infection, unspecified: Secondary | ICD-10-CM

## 2021-05-17 DIAGNOSIS — D699 Hemorrhagic condition, unspecified: Secondary | ICD-10-CM

## 2021-05-17 DIAGNOSIS — I8290 Acute embolism and thrombosis of unspecified vein: Secondary | ICD-10-CM

## 2021-05-17 DIAGNOSIS — E785 Hyperlipidemia, unspecified: Secondary | ICD-10-CM

## 2021-05-17 DIAGNOSIS — E139 Other specified diabetes mellitus without complications: Secondary | ICD-10-CM

## 2021-05-17 DIAGNOSIS — E119 Type 2 diabetes mellitus without complications: Secondary | ICD-10-CM

## 2021-05-17 DIAGNOSIS — R42 Dizziness and giddiness: Secondary | ICD-10-CM

## 2021-05-17 DIAGNOSIS — Z8619 Personal history of other infectious and parasitic diseases: Secondary | ICD-10-CM

## 2021-05-17 DIAGNOSIS — I10 Essential (primary) hypertension: Secondary | ICD-10-CM

## 2021-05-17 DIAGNOSIS — K5090 Crohn's disease, unspecified, without complications: Secondary | ICD-10-CM

## 2021-05-17 DIAGNOSIS — I82409 Acute embolism and thrombosis of unspecified deep veins of unspecified lower extremity: Secondary | ICD-10-CM

## 2021-05-17 DIAGNOSIS — D4959 Neoplasm of unspecified behavior of other genitourinary organ: Secondary | ICD-10-CM

## 2021-05-17 DIAGNOSIS — G40909 Epilepsy, unspecified, not intractable, without status epilepticus: Secondary | ICD-10-CM

## 2021-05-17 DIAGNOSIS — G43909 Migraine, unspecified, not intractable, without status migrainosus: Secondary | ICD-10-CM

## 2021-05-17 DIAGNOSIS — H109 Unspecified conjunctivitis: Secondary | ICD-10-CM

## 2021-05-17 DIAGNOSIS — K859 Acute pancreatitis without necrosis or infection, unspecified: Secondary | ICD-10-CM

## 2021-05-17 DIAGNOSIS — J302 Other seasonal allergic rhinitis: Secondary | ICD-10-CM

## 2021-05-17 DIAGNOSIS — IMO0002 Ulcer: Secondary | ICD-10-CM

## 2021-05-17 DIAGNOSIS — K56609 Unspecified intestinal obstruction, unspecified as to partial versus complete obstruction: Secondary | ICD-10-CM

## 2021-05-17 DIAGNOSIS — M797 Fibromyalgia: Secondary | ICD-10-CM

## 2021-05-17 DIAGNOSIS — K7581 Nonalcoholic steatohepatitis (NASH): Secondary | ICD-10-CM

## 2021-05-17 DIAGNOSIS — K219 Gastro-esophageal reflux disease without esophagitis: Secondary | ICD-10-CM

## 2021-05-17 MED ORDER — OLOPATADINE 0.2 % OP DROP
1 [drp] | Freq: Every day | OPHTHALMIC | 0 refills | 27.50000 days | Status: AC | PRN
Start: 2021-05-17 — End: ?

## 2021-05-17 NOTE — Patient Instructions
For sinus or nasal or chest congestion:  Drink plenty of non-caffeinated, non-alcoholic fluids.  Inhaled steam from a hot shower or humidifier.  Menthol, for example in Vicks Vapor rub.  Saline nasal spray, 1 spray to each nostril twice daily.  Mucinex (guaifenesin, generic is fine), 600 mg twice daily. Helps to thin out the mucus.     For drainage down the back of the throat (post-nasal drip):  Saline nasal spray, 1 spray to each nostril twice daily.  Flonase (fluticasone, generic is fine), 1 spray to each nostril twice daily.  May try zyrtec (generic cetirizine is fine), 10 mg once to twice daily.   May try Benadryl (generic diphenhydramine is fine) 25 mg nightly. Typically causes drowsiness, sometimes even the next day.     For cough:  Delsym, 5-10 ml every 12 hrs. as needed to suppress cough.  Throat lozenges/cough drops.  See above treatments for post-nasal drip, that can cause cough.     For sore throat:  Hot tea, preferably decaffeinated.  Gargle with warm salt-water every few hours as needed.  Throat lozenges/cough drops as needed.  Tylenol, not to exceed 3,000 mg in 24 hrs. For example, 1-2 extra strength Tylenol = 500-1,000 mg every 8 hrs. as needed.

## 2021-05-17 NOTE — Progress Notes
Obtained patient's verbal consent to treat them and their agreement to [facility] financial policy and NPP via this telehealth visit during the [facility] Emergency    Subjective:       Answers for HPI/ROS submitted by the patient on 05/17/2021  What topic(s) would you like to cover during your appointment?: Left eye discharge and swollen, , Sinus/cold  Please describe the issue(s) and history with the issue (location, severity, duration, symptoms, etc.). : Since Tuesday  What has been done so far to take care of the issue(s)?: OTC mucinex sinus medicine  What are your goals for this visit?: Fix problem      Tina Snyder is a 56 y.o. female. Patient presents today for Eye Drainage (Left eye)   .  The visit today was conducted using HIPAA-compliant Zoom video and audio platform integrated with Epic.    Left eye drainage is described as clear, sticky drainage. drains throughout the day. Woke up with the eye swollen and tender to touch. Tender in tear duct/inner canthus region. Not particularly itchy, no gritty sensation or redness to sclera.   No acute visual changes or pain deep in eye.     Has been dealing with some upper respiratory symptoms, believes she got from her granddaughter, who is in kindergarten. Her whole house has had a cold.   Taking sinus mucinex, fluids, rest.   No fevers, chills, body aches, no new or persistent headaches, sore throat, nausea, vomiting, diarrhea    Generally tired, but able to work. Poor appetite, but drinking well.   Felt better in general today than yesterday.        Review of Systems see HPI     Reviewed Past Medical History, Problem List and Medications.    Objective:         Vitals:    05/17/21 1625   Temp: 36.4 ?C (97.6 ?F)   PainSc: Four   Weight: 92.1 kg (203 lb)   Height: 162.6 cm (5' 4)      Telehealth Patient Reported Vitals     Row Name 05/17/21 1625                Pain Score Four        Pain Loc GENERALIZED                  Body mass index is 34.84 kg/m?Marland Kitchen     Physical Exam  Vitals and nursing note reviewed.   Constitutional:       General: She is not in acute distress.     Appearance: Normal appearance. She is not ill-appearing.   HENT:      Head: Normocephalic.   Eyes:      Conjunctiva/sclera: Conjunctivae normal.      Left eye: Left conjunctiva is not injected. No exudate.     Comments: Visible left lid erythema, mild edema. Normal sclera. Unable to visualize any drainage due to telehealth limitations.    Pulmonary:      Effort: Pulmonary effort is normal. No respiratory distress.   Skin:     Findings: Erythema present. No rash.   Neurological:      General: No focal deficit present.      Mental Status: She is alert and oriented to person, place, and time.   Psychiatric:         Mood and Affect: Mood normal.         Thought Content: Thought content normal.  Judgment: Judgment normal.            Assessment and Plan:    1. Conjunctivitis of left eye, unspecified conjunctivitis type  Differentials include viral versus allergic conjunctivitis.  Given most recent upper respiratory symptoms, likely viral however there is no associated conjunctival redness, gritty sensation.  Advised over-the-counter antihistamine/decongestant eyedrops as well as lubricating ointment at night.  Continue to keep the area clean, minimize make-up or cosmetics, and warm compresses throughout the day  Advise close follow-up if her eye becomes red, involves her right eye or if she starts to have mucopurulent drainage.  Patient verbalized understanding is agreement treatment plan  - olopatadine (PATADAY) 0.2 % ophthalmic solution; Apply one drop to left eye as directed daily as needed.  Dispense: 2.5 mL; Refill: 0    2. Upper respiratory infection  - likely viral, continue home supportive treatment at this time, if symptoms persist, or worsen recommend follow-up    No follow-ups on file.      Total duration of visit 30 minutes on the day of the visit, including chart review, review of vitals and nursing note, obtaining history from the patient, performing a medically appropriate examination and evaluation, counseling with patient, placing orders, coordination of care, and documentation.  Counseled patient regarding evaluation and management of diagnosis, risks and benefits of therapy.    Kallie Edward, APRN-NP FACP  Internal Medicine  05/17/2021             -----------------------------  Please see After Visit Summary recorded in the chart for review of patient instructions.

## 2021-05-18 ENCOUNTER — Encounter: Admit: 2021-05-18 | Discharge: 2021-05-18 | Payer: BC Managed Care – PPO

## 2021-05-18 NOTE — Telephone Encounter
Received a faxed request for office notes within the last 6 months from [facility]   Printed and faxed to [phone]

## 2021-05-18 NOTE — Telephone Encounter
Faxed LOV to [phone]  Confirmation of successful fax received

## 2021-05-18 NOTE — Telephone Encounter
VM received from [facility], Lorenzo  States they need LOV notes

## 2021-06-07 ENCOUNTER — Encounter: Admit: 2021-06-07 | Discharge: 2021-06-07 | Payer: BC Managed Care – PPO

## 2021-06-07 NOTE — Telephone Encounter
RN reached out to patient to get more information on appt 2/17. Pt states the tube in her ear came out and is still having constant ringing in her right ear. Pt states it has gotten significantly worse and has been going on since last summer.

## 2021-06-08 ENCOUNTER — Encounter: Admit: 2021-06-08 | Discharge: 2021-06-08 | Payer: BC Managed Care – PPO

## 2021-06-15 ENCOUNTER — Ambulatory Visit: Admit: 2021-06-15 | Discharge: 2021-06-16 | Payer: BC Managed Care – PPO

## 2021-06-15 ENCOUNTER — Encounter: Admit: 2021-06-15 | Discharge: 2021-06-15 | Payer: BC Managed Care – PPO

## 2021-06-15 VITALS — BP 164/84 | HR 67 | Temp 98.20000°F | Resp 18 | Ht 64.016 in | Wt 199.6 lb

## 2021-06-15 VITALS — BP 164/89 | HR 67

## 2021-06-15 DIAGNOSIS — K50918 Crohn's disease, unspecified, with other complication: Principal | ICD-10-CM

## 2021-06-15 MED ORDER — ACETAMINOPHEN 500 MG PO TAB
500 mg | Freq: Once | ORAL | 0 refills | Status: CP
Start: 2021-06-15 — End: ?
  Administered 2021-06-15: 21:00:00 500 mg via ORAL

## 2021-06-15 MED ORDER — DIPHENHYDRAMINE HCL 50 MG/ML IJ SOLN
25 mg | Freq: Once | INTRAVENOUS | 0 refills | Status: CP
Start: 2021-06-15 — End: ?
  Administered 2021-06-15: 21:00:00 25 mg via INTRAVENOUS

## 2021-06-15 MED ORDER — VEDOLIZUMAB IVPB
300 mg | Freq: Once | INTRAVENOUS | 0 refills | Status: CP
Start: 2021-06-15 — End: ?
  Administered 2021-06-15 (×2): 300 mg via INTRAVENOUS

## 2021-06-15 MED ORDER — DIPHENHYDRAMINE HCL 50 MG/ML IJ SOLN
25 mg | Freq: Once | INTRAVENOUS | 0 refills
Start: 2021-06-15 — End: ?

## 2021-06-15 MED ORDER — DIPHENHYDRAMINE HCL 50 MG/ML IJ SOLN
25 mg | Freq: Once | INTRAVENOUS | 0 refills | Status: CP | PRN
Start: 2021-06-15 — End: ?
  Administered 2021-06-15: 22:00:00 25 mg via INTRAVENOUS

## 2021-06-15 MED ORDER — ACETAMINOPHEN 500 MG PO TAB
500 mg | Freq: Once | ORAL | 0 refills
Start: 2021-06-15 — End: ?

## 2021-06-15 MED ORDER — VEDOLIZUMAB IVPB
300 mg | Freq: Once | INTRAVENOUS | 0 refills
Start: 2021-06-15 — End: ?

## 2021-06-15 NOTE — Progress Notes
Patient tolerated Entyvio infusion; no reaction noted.

## 2021-07-09 ENCOUNTER — Encounter: Admit: 2021-07-09 | Discharge: 2021-07-09 | Payer: BC Managed Care – PPO

## 2021-07-10 ENCOUNTER — Encounter: Admit: 2021-07-10 | Discharge: 2021-07-10 | Payer: BC Managed Care – PPO

## 2021-07-10 NOTE — Telephone Encounter
An encounter has been created for documentation only (often for preparation of an upcoming appointment or for follow up on orders/imaging) and patient does not need contact RN and did not miss a phone call or appointment.     RN prepped patient information for clinic with Dr.Duthuluru on 07/16/2021 at 0950 via in person at [facility].     Patient is here for 3 month followup   Dx:OSA, bronchial atresia, chronic respiratory failure with hypoxia, primary insomnia    Last SS: 10/10/2017 with AHI 133  PFTs: 06/19/2020.   Ex Oc: 07/20/2020- 2L 02 with exertion. 3L bled in with CPAP     Meds:  Primary insomnia:  Started Lunesta 37m at previous visit.   Hypoxia:  02    Previous aCPAP 8-15  DPVX:YIAXK RN obtained download from [employer]

## 2021-07-16 ENCOUNTER — Encounter: Admit: 2021-07-16 | Discharge: 2021-07-16 | Payer: BC Managed Care – PPO

## 2021-07-16 DIAGNOSIS — E782 Mixed hyperlipidemia: Secondary | ICD-10-CM

## 2021-07-16 MED ORDER — ATORVASTATIN 40 MG PO TAB
ORAL_TABLET | 0 refills
Start: 2021-07-16 — End: ?

## 2021-07-17 ENCOUNTER — Encounter: Admit: 2021-07-17 | Discharge: 2021-07-17 | Payer: BC Managed Care – PPO

## 2021-08-06 ENCOUNTER — Encounter: Admit: 2021-08-06 | Discharge: 2021-08-06 | Payer: BC Managed Care – PPO

## 2021-08-06 DIAGNOSIS — E782 Mixed hyperlipidemia: Secondary | ICD-10-CM

## 2021-08-06 MED ORDER — ATORVASTATIN 40 MG PO TAB
40 mg | ORAL_TABLET | Freq: Every day | ORAL | 1 refills | Status: AC
Start: 2021-08-06 — End: ?

## 2021-08-06 MED ORDER — ATORVASTATIN 40 MG PO TAB
ORAL_TABLET | 0 refills
Start: 2021-08-06 — End: ?

## 2021-08-07 ENCOUNTER — Encounter: Admit: 2021-08-07 | Discharge: 2021-08-07 | Payer: BC Managed Care – PPO

## 2021-08-07 DIAGNOSIS — E1100 Type 2 diabetes mellitus with hyperosmolarity without nonketotic hyperglycemic-hyperosmolar coma (NKHHC): Secondary | ICD-10-CM

## 2021-08-10 ENCOUNTER — Encounter: Admit: 2021-08-10 | Discharge: 2021-08-10 | Payer: BC Managed Care – PPO

## 2021-08-10 MED ORDER — DIPHENHYDRAMINE HCL 50 MG/ML IJ SOLN
25 mg | Freq: Once | INTRAVENOUS | 0 refills
Start: 2021-08-10 — End: ?

## 2021-08-10 MED ORDER — DIPHENHYDRAMINE HCL 50 MG/ML IJ SOLN
25 mg | Freq: Once | INTRAVENOUS | 0 refills | Status: CP
Start: 2021-08-10 — End: ?
  Administered 2021-08-10: 20:00:00 25 mg via INTRAVENOUS

## 2021-08-10 MED ORDER — ACETAMINOPHEN 500 MG PO TAB
500 mg | Freq: Once | ORAL | 0 refills
Start: 2021-08-10 — End: ?

## 2021-08-10 MED ORDER — VEDOLIZUMAB IVPB
300 mg | Freq: Once | INTRAVENOUS | 0 refills | Status: CP
Start: 2021-08-10 — End: ?
  Administered 2021-08-10 (×2): 300 mg via INTRAVENOUS

## 2021-08-10 MED ORDER — ACETAMINOPHEN 500 MG PO TAB
500 mg | Freq: Once | ORAL | 0 refills | Status: CP
Start: 2021-08-10 — End: ?
  Administered 2021-08-10: 19:00:00 500 mg via ORAL

## 2021-08-10 MED ORDER — VEDOLIZUMAB IVPB
300 mg | Freq: Once | INTRAVENOUS | 0 refills
Start: 2021-08-10 — End: ?

## 2021-08-10 MED ORDER — DIPHENHYDRAMINE HCL 50 MG/ML IJ SOLN
25 mg | Freq: Once | INTRAVENOUS | 0 refills | Status: CP | PRN
Start: 2021-08-10 — End: ?
  Administered 2021-08-10: 19:00:00 25 mg via INTRAVENOUS

## 2021-08-10 NOTE — Progress Notes
1505:  Patient tolerated infusion without difficulty.  No signs of reaction noted.

## 2021-08-11 ENCOUNTER — Ambulatory Visit: Admit: 2021-08-10 | Discharge: 2021-08-11 | Payer: BC Managed Care – PPO

## 2021-08-11 DIAGNOSIS — K50918 Crohn's disease, unspecified, with other complication: Secondary | ICD-10-CM

## 2021-08-13 ENCOUNTER — Encounter: Admit: 2021-08-13 | Discharge: 2021-08-13 | Payer: BC Managed Care – PPO

## 2021-08-13 DIAGNOSIS — F324 Major depressive disorder, single episode, in partial remission: Secondary | ICD-10-CM

## 2021-08-13 MED ORDER — BUPROPION XL 300 MG PO TB24
300 mg | ORAL_TABLET | Freq: Every morning | ORAL | 1 refills | Status: AC
Start: 2021-08-13 — End: ?

## 2021-08-15 ENCOUNTER — Encounter: Admit: 2021-08-15 | Discharge: 2021-08-15 | Payer: BC Managed Care – PPO

## 2021-08-15 NOTE — Telephone Encounter
Received request from DME for provider to sign form necessary for billing. CMN for oxygen. Form completed and given to provider for signature.    Form will be faxed to [employer]

## 2021-08-21 ENCOUNTER — Encounter: Admit: 2021-08-21 | Discharge: 2021-08-21 | Payer: BC Managed Care – PPO

## 2021-08-22 NOTE — Progress Notes
Subjective:       Diabetes      Tina Snyder is a 56 y.o. female who was seen today as a follow up for type 2 DM. Previously seen by [name], PA-C. This is pt's first visit with me.     Interval changes:   Watching portions of foods and has been successful with weight loss since last year.   Having issues getting sensors in timely manner through [facility].   Has reduced her Novolog to only as needed. Gives 25 units post meals if blood sugars are rising. Has not used in 6 weeks consistently.     Appetite is hit or miss with her Crohn's.   ?  Summary of Glucose Control:??  Lab Results   Component Value Date    A1C 6.2 (A) 12/27/2020    A1C 6.4 (A) 09/05/2020    GLUPOC 116 12/27/2020     Dexcom download:      -----------------------------  Tina Snyder  Date of Birth: October 05, 1965   Generated at: 08/23/21  Reporting period: 08/10/21-08/23/21  -----------------------------  -----------------------------  CGM Details  Sensor usage: 100%  Days with CGM data: 14/14    Trends:   - Time in range is near goal without hypoglycemia.   - some post prandial spikes after lunch and supper   ?  Type 2 DM:   Dx: 2012  Current regimen:  Lantus 34 units daily, Novolog 20 units with meals (has not taken in 6-7 weeks due to improvement in blood sugars), Farxiga 5 mg daily    Frequency of hypoglycemia infrequent  ? How many meals per day??3 meals with snack ?  Weight changes: none--trying to lose weight   Exercise? no   Lipid: Lipitor 40 mg daily    Eye exam: today   Foot exam: March 2023         Family History of diabetes: none, son with celiac  Social History: lives with her husband, Quinn, in [place]. Does not drink or smoke.    ?  Medical History:   Diagnosis Date   ? Bleeding tendency (HCC)    ? 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 Bowel disease    ? CKD (chronic kidney disease) stage 3, GFR 30-59 ml/min (HCC)    ? Crohn disease (HCC)    ? Deep vein thrombosis (DVT) (HCC)    ? Difficult intravenous access    ? Dizziness    ? DM (diabetes mellitus), secondary-steroid-induced 2012   ? Fibromyalgia    ? GERD (gastroesophageal reflux disease)    ? H/O Mycobacterium avium intracellulare infection    ? Hyperlipidemia    ? Hypertension    ? Hypertensive heart disease    ? Migraines    ? Pancreatitis    ? Seasonal allergic reaction    ? Seizure disorder (HCC)    ? Small bowel obstruction (HCC)    ? Steatohepatitis     felt secondary to medications   ? Type 2 diabetes mellitus without complication, with long-term current use of insulin (HCC) 10/22/2017   ? Ulcer    ? Vaginal tumor        Review of Systems  Positive for stomach pain, shooting pains in feet/legs.       Objective:         ? atorvastatin (LIPITOR) 40 mg tablet Take one tablet by mouth daily.   ? AUVI-Q 0.3 mg/0.3 mL auto-injector Inject 0.3 mg (  1 Pen) into thigh if needed for anaphylactic reaction. May repeat in 5-15 minutes if needed.   ? Blood-Glucose Sensor (DEXCOM G6 SENSOR) sensor device Use one each as directed as directed. Change every 10 days   ? budesonide (ENTOCORT EC) 3 mg capsule TAKE 2 CAPSULES BY MOUTH EVERY DAY   ? buPROPion XL (WELLBUTRIN XL) 300 mg tablet TAKE ONE TABLET BY MOUTH EVERY MORNING. DO NOT CRUSH OR CHEW.   ? cyclobenzaprine (FLEXERIL) 10 mg tablet Take one tablet by mouth three times daily.   ? dapagliflozin (FARXIGA) 5 mg tablet Take one tablet by mouth daily.   ? DEXCOM G6 RECEIVER misc Use 1 each as directed daily.   ? DEXCOM G6 TRANSMITTER transmitter device Use one each as directed as directed. Replace every 90 days   ? dexlansoprazole (DEXILANT) 60 mg capsule Take one capsule by mouth daily. Indications: gastroesophageal reflux disease   ? diclofenac sodium (VOLTAREN) 1 % topical gel Apply four g topically to affected area four times daily.   ? duloxetine DR (CYMBALTA) 30 mg capsule TAKE 1 CAPSULE BY MOUTH EVERY DAY   ? eszopiclone (LUNESTA) 1 mg tablet Take one tablet by mouth at bedtime as needed for Sleep.   ? famotidine (PEPCID) 20 mg tablet TAKE ONE TABLET BY MOUTH AT BEDTIME DAILY.   ? glucagon (BAQSIMI) 3 mg/actuation nasal spray Use 1 spray in single nostril. If no response, may repeat in 15 minutes using second device.   ? icosapent ethyL (VASCEPA) 1 gram capsule Take two capsules by mouth twice daily with meals.   ? insulin aspart (U-100) (NOVOLOG FLEXPEN U-100 INSULIN) 100 unit/mL (3 mL) PEN Inject 20 units with meals plus correction. Max 80 units/day.   ? insulin glargine (BASAGLAR KWIKPEN U-100 INSULIN) 100 unit/mL (3 mL) subcutaneous PEN Inject thirty two Units under the skin at bedtime daily.   ? montelukast (SINGULAIR) 10 mg tablet Take one tablet by mouth at bedtime daily.   ? nebivolol (BYSTOLIC) 10 mg tablet Take one tablet by mouth daily.   ? olopatadine (PATADAY) 0.2 % ophthalmic solution Apply one drop to left eye as directed daily as needed.   ? ondansetron HCL (ZOFRAN) 4 mg tablet Take one tablet by mouth every 8 hours.   ? pantoprazole DR (PROTONIX) 40 mg tablet Take one tablet by mouth daily.   ? pen needle, diabetic (BD NANO 2ND GEN PEN NEEDLE) 32 gauge x 5/32 pen needle Use one each as directed before meals and at bedtime. Use with insulin injections.   ? polyethylene glycol 3350 (MIRALAX) 17 gram/dose powder Take seventeen g by mouth twice daily.   ? promethazine (PHENERGAN) 25 mg tablet Take one tablet by mouth every 6 hours as needed for Nausea or Vomiting.   ? triamcinolone acetonide (KENALOG) 0.1 % topical ointment Apply  topically to affected area twice daily.   ? vedolizumab (ENTYVIO IV) Administer  through vein every 8 weeks.       Vitals:    08/23/21 1359   BP: (!) 163/93   Pulse: 73     Body mass index is 34.86 kg/m?Marland Kitchen      Physical Exam  Vitals and nursing note reviewed.   Constitutional:       General: She is not in acute distress.     Appearance: Normal appearance. She is not ill-appearing.   HENT:      Head: Normocephalic and atraumatic.      Nose: Nose normal.   Eyes:  Conjunctiva/sclera: Conjunctivae normal. Neurological:      Mental Status: She is alert and oriented to person, place, and time.   Psychiatric:         Mood and Affect: Mood normal.         Behavior: Behavior normal.         Thought Content: Thought content normal.         Judgment: Judgment normal.     Diabetic Foot Exam       Bilateral vascular, sensation, integument are normal:  Yes except dry skin    Labs:   Comprehensive Metabolic Profile    Lab Results   Component Value Date/Time    NA 142 08/10/2021 02:33 PM    K 4.2 08/10/2021 02:33 PM    CL 107 08/10/2021 02:33 PM    CO2 23 08/10/2021 02:33 PM    GAP 12 08/10/2021 02:33 PM    BUN 21 08/10/2021 02:33 PM    CR 1.05 (H) 08/10/2021 02:33 PM    GLU 185 (H) 08/10/2021 02:33 PM    Lab Results   Component Value Date/Time    CA 9.3 08/10/2021 02:33 PM    PO4 3.4 11/03/2020 11:07 AM    ALBUMIN 3.9 08/10/2021 02:33 PM    TOTPROT 6.5 08/10/2021 02:33 PM    ALKPHOS 153 (H) 08/10/2021 02:33 PM    AST 14 08/10/2021 02:33 PM    ALT 15 08/10/2021 02:33 PM    TOTBILI 0.3 08/10/2021 02:33 PM    EGFR1 >60 08/10/2021 02:33 PM        No results found for: MCALB24   Microalbumin/CR ratio Urine   Date Value Ref Range Status   09/19/2020 30.81 (H) <30 ug/mg Final     Comment:     NOTE NEW REFERENCE RANGES     Microalbumin, Random   Date Value Ref Range Status   09/19/2020 64.7 MCG/ML Final     Lab Results   Component Value Date    CHOL 119 09/19/2020    TRIG 173 (H) 09/19/2020    HDL 30 (L) 09/19/2020    LDL 66 09/19/2020    VLDL 35 09/19/2020    NONHDLCHOL 89 09/19/2020      TSH   Date Value Ref Range Status   11/03/2020 3.23 0.35 - 5.00 MCU/ML Final       Hemoglobin A1C   Date Value Ref Range Status   10/14/2019 6.8 (H) 4.0 - 6.0 % Final     Comment:     The ADA recommends that most patients with type 1 and type 2 diabetes maintain   an A1c level <7%.     11/16/2018 9.1 (H) 4.0 - 6.0 % Final     Comment:     The ADA recommends that most patients with type 1 and type 2 diabetes maintain   an A1c level <7%. 10/24/2015 9.3 (H) 4.0 - 6.0 % Final     Comment:     The ADA recommends that most patients with type 1 and type 2 diabetes maintain   an A1c level <7%.       Poc Hemoglobin A1C   Date Value Ref Range Status   12/27/2020 6.2 (A) 4 - 6 % Final   09/05/2020 6.4 (A) 4 - 6 % Final   03/26/2019 7.5 (H) 4 - 6 % Final     Lab Results   Component Value Date    PLTCT 218 08/10/2021  Assessment and Plan:  Diabetes mellitus type 2,  controlled  A1C 6.6%, time in range on CGM at goal   ? Target A1c <7% without significant hypoglycemia  ? Complicated by: Nephropathy, poorly healing wounds, pancreatitis   ? On Lantus 34 units daily, Farxiga 5 mg daily, Novolog PRN  ?  Plan:  ? Continue Lantus 34 units daily-- adjust based on treat to target with fasting goal 80-130.   ? Continue Farxiga 5 mg daily  ? Start Novolog 10 units with lunch and supper and adjust based on Dexcom   ? She did have a yeast infection on farxiga in the past-- alert me to further yeast infections.   ? Will continue to avoid GLP1 due to hx of pancreatitis.   ? Discussed rule of 15 and how to tx hypoglycemia  ? Reminded pt to rotate insulin sites    Diabetic complications - prevention and management:  - Annual labs (electrolytes and renal function): Last done 08/2020--ordered repeat   - Annual eye exam: Last done  April 2023. Retinopathy - none  - Hyperlipidemia: hypertriglyceridemia. Lipid panel: Last done 08/2020--ordered repeat          - Statin: yes         - Discussed dietary modifications (low cholesterol)  - Nephropathy: yes, CKD 3. Annual Urine microalbumin/Cr: 08/2020, slightly elevated.  Ordered repeat.   - Foot exam / monofilament exam (recommended annually): Last done today         - Discussed foot care.   - Diabetic Educator and/or nutritionist visit (recommended annually): following with Pattie Leuyot RD [facility]     Obesity, class 1:   Discussed patient's BMI with her.  The body mass index is 34.86 kg/m?Marland Kitchen and falls within the category of Obesity 2 (35 to <40); BMI plan is in progress and plans to follow up with dietitian. Encouraged her on weight loss so far.      RTC in 3 months     Patient Instructions     Patient Instructions   It was nice to see you today--I'm so grateful you came in today!     Goals we discussed today:     ? Keep up the good work! A1c was 6.6%--this is amazing!  ? Continue Lantus 34 units daily.   ? Start Novolog 10 units with lunch and supper if needed.   ? Continue Farxiga 5 mg daily.   ? We will try to upgrade to the [facility] G7.   ? Repeat your labs (fasting) at your earliest convenience.     Please contact [facility] for any questions or abnormal glucose values (above 300 for 2 days in a row or less than 70 twice in a week).  [phone]     My nurse, Lynnea Ferrier, can be reached at [phone]      [facility], APRN-NP                             30 minutes spent on this patient's encounter with counseling and coordination of care taking >50% of the visit.

## 2021-08-23 ENCOUNTER — Encounter: Admit: 2021-08-23 | Discharge: 2021-08-23 | Payer: BC Managed Care – PPO

## 2021-08-23 ENCOUNTER — Ambulatory Visit: Admit: 2021-08-23 | Discharge: 2021-08-24 | Payer: BC Managed Care – PPO

## 2021-08-23 VITALS — BP 174/97 | Ht 64.016 in | Wt 203.2 lb

## 2021-08-23 VITALS — BP 163/93 | HR 73

## 2021-08-23 DIAGNOSIS — E6609 Other obesity due to excess calories: Secondary | ICD-10-CM

## 2021-08-23 DIAGNOSIS — K219 Gastro-esophageal reflux disease without esophagitis: Secondary | ICD-10-CM

## 2021-08-23 DIAGNOSIS — Z8619 Personal history of other infectious and parasitic diseases: Secondary | ICD-10-CM

## 2021-08-23 DIAGNOSIS — D4959 Neoplasm of unspecified behavior of other genitourinary organ: Secondary | ICD-10-CM

## 2021-08-23 DIAGNOSIS — G40909 Epilepsy, unspecified, not intractable, without status epilepticus: Secondary | ICD-10-CM

## 2021-08-23 DIAGNOSIS — K639 Disease of intestine, unspecified: Secondary | ICD-10-CM

## 2021-08-23 DIAGNOSIS — R42 Dizziness and giddiness: Secondary | ICD-10-CM

## 2021-08-23 DIAGNOSIS — D699 Hemorrhagic condition, unspecified: Secondary | ICD-10-CM

## 2021-08-23 DIAGNOSIS — K7581 Nonalcoholic steatohepatitis (NASH): Secondary | ICD-10-CM

## 2021-08-23 DIAGNOSIS — N183 CKD (chronic kidney disease) stage 3, GFR 30-59 ml/min (HCC): Secondary | ICD-10-CM

## 2021-08-23 DIAGNOSIS — K5090 Crohn's disease, unspecified, without complications: Secondary | ICD-10-CM

## 2021-08-23 DIAGNOSIS — M797 Fibromyalgia: Secondary | ICD-10-CM

## 2021-08-23 DIAGNOSIS — E1165 Type 2 diabetes mellitus with hyperglycemia: Principal | ICD-10-CM

## 2021-08-23 DIAGNOSIS — J302 Other seasonal allergic rhinitis: Secondary | ICD-10-CM

## 2021-08-23 DIAGNOSIS — E785 Hyperlipidemia, unspecified: Secondary | ICD-10-CM

## 2021-08-23 DIAGNOSIS — K56609 Unspecified intestinal obstruction, unspecified as to partial versus complete obstruction: Secondary | ICD-10-CM

## 2021-08-23 DIAGNOSIS — E119 Type 2 diabetes mellitus without complications: Secondary | ICD-10-CM

## 2021-08-23 DIAGNOSIS — IMO0002 Ulcer: Secondary | ICD-10-CM

## 2021-08-23 DIAGNOSIS — K859 Acute pancreatitis without necrosis or infection, unspecified: Secondary | ICD-10-CM

## 2021-08-23 DIAGNOSIS — Z789 Other specified health status: Secondary | ICD-10-CM

## 2021-08-23 DIAGNOSIS — E139 Other specified diabetes mellitus without complications: Secondary | ICD-10-CM

## 2021-08-23 DIAGNOSIS — G43909 Migraine, unspecified, not intractable, without status migrainosus: Secondary | ICD-10-CM

## 2021-08-23 DIAGNOSIS — I8290 Acute embolism and thrombosis of unspecified vein: Secondary | ICD-10-CM

## 2021-08-23 DIAGNOSIS — I10 Essential (primary) hypertension: Secondary | ICD-10-CM

## 2021-08-23 DIAGNOSIS — I82409 Acute embolism and thrombosis of unspecified deep veins of unspecified lower extremity: Secondary | ICD-10-CM

## 2021-08-23 MED ORDER — INSULIN GLARGINE 100 UNIT/ML (3 ML) SC INJ PEN
34 [IU] | Freq: Every evening | SUBCUTANEOUS | 3 refills | 68.00000 days | Status: AC
Start: 2021-08-23 — End: ?

## 2021-08-23 MED ORDER — DAPAGLIFLOZIN 5 MG PO TAB
5 mg | ORAL_TABLET | Freq: Every day | ORAL | 3 refills | Status: AC
Start: 2021-08-23 — End: ?

## 2021-08-23 NOTE — Progress Notes
Patient states we are connected with her Dexcom G6.

## 2021-08-24 ENCOUNTER — Encounter: Admit: 2021-08-24 | Discharge: 2021-08-24 | Payer: BC Managed Care – PPO

## 2021-08-24 DIAGNOSIS — Z794 Long term (current) use of insulin: Secondary | ICD-10-CM

## 2021-08-24 MED ORDER — DEXCOM G7 SENSOR MISC DEVI
1 | Freq: Before meals | 3 refills | Status: AC
Start: 2021-08-24 — End: ?

## 2021-08-24 NOTE — Telephone Encounter
RN attempted PA on covermymeds with pharmacy coverage card   Tina Snyder (Key: [id number])Need help? Call us at [phone]  Outcome  New(Not sent to plan)  This drug is too soon to refill. Please advise the pharmacy to either resubmit on the next fill date or contact the [facility] at [phone] for assistance  Next Steps  Complete the request below, then click Send to Plan.    Drug  Dexcom G7 Sensor    RN called [facility] number to do verbal PA. PA rep stated it is a paid claim through [place] starting May 11th.      RN called patient to relay information. RN will send script to [facility] and will check in about payment.    Donnella Sham, RN

## 2021-08-26 ENCOUNTER — Encounter: Admit: 2021-08-26 | Discharge: 2021-08-26 | Payer: BC Managed Care – PPO

## 2021-08-27 ENCOUNTER — Ambulatory Visit: Admit: 2021-08-27 | Discharge: 2021-08-27 | Payer: BC Managed Care – PPO

## 2021-08-27 ENCOUNTER — Encounter: Admit: 2021-08-27 | Discharge: 2021-08-27 | Payer: BC Managed Care – PPO

## 2021-08-27 DIAGNOSIS — R609 Edema, unspecified: Secondary | ICD-10-CM

## 2021-08-27 DIAGNOSIS — E1165 Type 2 diabetes mellitus with hyperglycemia: Secondary | ICD-10-CM

## 2021-08-27 LAB — COMPREHENSIVE METABOLIC PANEL
ALBUMIN: 4.2 g/dL (ref 3.5–5.0)
ALK PHOSPHATASE: 154 U/L — ABNORMAL HIGH (ref 25–110)
ALT: 17 U/L (ref 7–56)
ANION GAP: 12 (ref 3–12)
AST: 20 U/L (ref 7–40)
BLD UREA NITROGEN: 22 mg/dL (ref 7–25)
CALCIUM: 9.3 mg/dL (ref 8.5–10.6)
CO2: 23 MMOL/L (ref 21–30)
CREATININE: 1.2 mg/dL — ABNORMAL HIGH (ref 0.4–1.00)
EGFR: 53 mL/min — ABNORMAL LOW (ref >60)
GLUCOSE,PANEL: 109 mg/dL — ABNORMAL HIGH (ref 70–100)
POTASSIUM: 4.2 MMOL/L (ref <100)
SODIUM: 140 MMOL/L (ref >40)
TOTAL BILIRUBIN: 0.6 mg/dL (ref 0.3–1.2)
TOTAL PROTEIN: 6.8 g/dL (ref 6.0–8.0)

## 2021-08-27 LAB — TSH WITH FREE T4 REFLEX: TSH: 2.3 uU/mL (ref 0.35–5.00)

## 2021-08-27 LAB — MICROALB/CR RATIO-URINE RANDOM
MICROALBUMIN, RAN: 21 ug/mL
UR CREATININE, RAN: 29 mg/dL

## 2021-08-27 LAB — LIPID PROFILE
CHOLESTEROL: 132 mg/dL — ABNORMAL HIGH (ref <200)
TRIGLYCERIDES: 104 mg/dL (ref <150)

## 2021-08-28 ENCOUNTER — Encounter: Admit: 2021-08-28 | Discharge: 2021-08-28 | Payer: BC Managed Care – PPO

## 2021-08-29 ENCOUNTER — Encounter: Admit: 2021-08-29 | Discharge: 2021-08-29 | Payer: BC Managed Care – PPO

## 2021-08-30 ENCOUNTER — Encounter: Admit: 2021-08-30 | Discharge: 2021-08-30 | Payer: BC Managed Care – PPO

## 2021-08-31 ENCOUNTER — Encounter: Admit: 2021-08-31 | Discharge: 2021-08-31 | Payer: BC Managed Care – PPO

## 2021-08-31 MED ORDER — EPINEPHRINE 0.3 MG/0.3 ML IJ ATIN
INTRAMUSCULAR | 1 refills | 30.00000 days | Status: AC
Start: 2021-08-31 — End: ?

## 2021-08-31 MED FILL — DEXCOM G7 SENSOR MISC DEVI: 30 days supply | Qty: 3 | Fill #1 | Status: AC

## 2021-09-29 ENCOUNTER — Encounter: Admit: 2021-09-29 | Discharge: 2021-09-29 | Payer: BC Managed Care – PPO

## 2021-10-01 ENCOUNTER — Encounter: Admit: 2021-10-01 | Discharge: 2021-10-01 | Payer: BC Managed Care – PPO

## 2021-10-02 ENCOUNTER — Encounter: Admit: 2021-10-02 | Discharge: 2021-10-02 | Payer: BC Managed Care – PPO

## 2021-10-02 MED FILL — DEXCOM G7 SENSOR MISC DEVI: 30 days supply | Qty: 3 | Fill #2 | Status: AC

## 2021-10-03 ENCOUNTER — Encounter: Admit: 2021-10-03 | Discharge: 2021-10-03 | Payer: BC Managed Care – PPO

## 2021-10-03 DIAGNOSIS — K50018 Crohn's disease of small intestine with other complication: Secondary | ICD-10-CM

## 2021-10-03 MED ORDER — CYCLOBENZAPRINE 10 MG PO TAB
ORAL_TABLET | ORAL | 1 refills | 30.00000 days | Status: AC
Start: 2021-10-03 — End: ?

## 2021-10-03 MED ORDER — BUDESONIDE 3 MG PO CECX
ORAL_CAPSULE | 1 refills
Start: 2021-10-03 — End: ?

## 2021-10-03 NOTE — Telephone Encounter
Requested Prescriptions   Pending Prescriptions Disp Refills    cyclobenzaprine (FLEXERIL) 10 mg tablet [Pharmacy Med Name: CYCLOBENZAPRINE 10 MG TABLET] 90 tablet 1     Sig: TAKE 1 TABLET BY MOUTH THREE TIMES A DAY       Analgesics: Muscle Relaxants Failed - 10/03/2021 11:33 AM        Failed - This refill cannot be delegated        Passed - Valid encounter within last 6 months     Recent Visits  Date Type Provider Dept   05/17/21 Office Visit Telehealth Otilio Jefferson, APRN-NP [place]   11/23/20 Office Visit Telehealth Patsey Berthold, Levester Fresh, MD [facility]   09/05/20 Office Visit Brubacher, Levester Fresh, MD [facility]   12/24/19 Office Visit Patsey Berthold Levester Fresh, MD [facility]   10/14/19 Office Visit Telehealth Dudenhoeffer, Clenton Pare, APRN-NP [place] recent visits within past 720 days and meeting all other requirements  Future Appointments  Date Type Provider Dept   10/11/21 Appointment Patsey Berthold Levester Fresh, MD [facility]   Showing future appointments within next 360 days and meeting all other requirements            Passed - Patient is not pregnant        Passed - Patient is < 89 years old. [facility] Criteria: Use with caution in patients 65+           Some refill protocol elements NOT Met  Medication name: Flexeril  Medication Strength: 10 mg    Last Fill Date: 04/13/21    Non-delegated medication    Routed to Provider

## 2021-10-05 ENCOUNTER — Encounter: Admit: 2021-10-05 | Discharge: 2021-10-05 | Payer: BC Managed Care – PPO

## 2021-10-05 ENCOUNTER — Ambulatory Visit: Admit: 2021-10-05 | Discharge: 2021-10-06 | Payer: BC Managed Care – PPO

## 2021-10-05 DIAGNOSIS — K50918 Crohn's disease, unspecified, with other complication: Secondary | ICD-10-CM

## 2021-10-05 MED ORDER — VEDOLIZUMAB IVPB
300 mg | Freq: Once | INTRAVENOUS | 0 refills
Start: 2021-10-05 — End: ?

## 2021-10-05 MED ORDER — DIPHENHYDRAMINE HCL 50 MG/ML IJ SOLN
25 mg | Freq: Once | INTRAVENOUS | 0 refills
Start: 2021-10-05 — End: ?

## 2021-10-05 MED ORDER — DIPHENHYDRAMINE HCL 50 MG/ML IJ SOLN
25 mg | Freq: Once | INTRAVENOUS | 0 refills | Status: CP
Start: 2021-10-05 — End: ?
  Administered 2021-10-05: 20:00:00 25 mg via INTRAVENOUS

## 2021-10-05 MED ORDER — DIPHENHYDRAMINE HCL 50 MG/ML IJ SOLN
25 mg | Freq: Once | INTRAVENOUS | 0 refills | Status: CP | PRN
Start: 2021-10-05 — End: ?
  Administered 2021-10-05: 21:00:00 25 mg via INTRAVENOUS

## 2021-10-05 MED ORDER — ACETAMINOPHEN 500 MG PO TAB
500 mg | Freq: Once | ORAL | 0 refills
Start: 2021-10-05 — End: ?

## 2021-10-05 MED ORDER — ACETAMINOPHEN 500 MG PO TAB
500 mg | Freq: Once | ORAL | 0 refills | Status: CP
Start: 2021-10-05 — End: ?
  Administered 2021-10-05: 20:00:00 500 mg via ORAL

## 2021-10-05 MED ORDER — VEDOLIZUMAB IVPB
300 mg | Freq: Once | INTRAVENOUS | 0 refills | Status: CP
Start: 2021-10-05 — End: ?
  Administered 2021-10-05 (×2): 300 mg via INTRAVENOUS

## 2021-10-05 NOTE — Progress Notes
Patient presents to infusion clinic for vedolizumab infusion.  Patient denies recent concern for infection, fever, or antibiotic use.  Pre-infusion vital signs completed. Ordered labs drawn with peripheral IV start.    ~1510: Pre-medications administered, see e-mar for details.    ~1522: Infusion started, see e-mar for administration details     ~ PRN dose of diphenhydramine given. See emar for details.    ~1600: Infusion completed and flushed. See e-mar for administration details and Post infusion vitals completed. No concern for infusion reaction. Patient has no complaints    ~1610: PIV discontinued. Patient declined printed AVS. Prompted patient to schedule upcoming appointment(s) at infusion checkout or call infusion scheduling. Patient verbalized understanding. Patient ambulated off unit.

## 2021-10-11 ENCOUNTER — Encounter: Admit: 2021-10-11 | Discharge: 2021-10-11 | Payer: BC Managed Care – PPO

## 2021-10-11 ENCOUNTER — Ambulatory Visit: Admit: 2021-10-11 | Discharge: 2021-10-12 | Payer: BC Managed Care – PPO

## 2021-10-11 VITALS — BP 152/110 | HR 69

## 2021-10-11 VITALS — BP 160/101 | HR 67 | Resp 18 | Ht 64.0 in | Wt 199.0 lb

## 2021-10-11 DIAGNOSIS — Z23 Encounter for immunization: Secondary | ICD-10-CM

## 2021-10-11 DIAGNOSIS — J302 Other seasonal allergic rhinitis: Secondary | ICD-10-CM

## 2021-10-11 DIAGNOSIS — Z789 Other specified health status: Secondary | ICD-10-CM

## 2021-10-11 DIAGNOSIS — I82409 Acute embolism and thrombosis of unspecified deep veins of unspecified lower extremity: Secondary | ICD-10-CM

## 2021-10-11 DIAGNOSIS — M79652 Pain in left thigh: Principal | ICD-10-CM

## 2021-10-11 DIAGNOSIS — K7581 Nonalcoholic steatohepatitis (NASH): Secondary | ICD-10-CM

## 2021-10-11 DIAGNOSIS — E119 Type 2 diabetes mellitus without complications: Secondary | ICD-10-CM

## 2021-10-11 DIAGNOSIS — K859 Acute pancreatitis without necrosis or infection, unspecified: Secondary | ICD-10-CM

## 2021-10-11 DIAGNOSIS — Z8619 Personal history of other infectious and parasitic diseases: Secondary | ICD-10-CM

## 2021-10-11 DIAGNOSIS — D4959 Neoplasm of unspecified behavior of other genitourinary organ: Secondary | ICD-10-CM

## 2021-10-11 DIAGNOSIS — IMO0002 Ulcer: Secondary | ICD-10-CM

## 2021-10-11 DIAGNOSIS — Z0000 Encounter for general adult medical examination without abnormal findings: Secondary | ICD-10-CM

## 2021-10-11 DIAGNOSIS — E785 Hyperlipidemia, unspecified: Secondary | ICD-10-CM

## 2021-10-11 DIAGNOSIS — K219 Gastro-esophageal reflux disease without esophagitis: Secondary | ICD-10-CM

## 2021-10-11 DIAGNOSIS — A310 Pulmonary mycobacterial infection: Secondary | ICD-10-CM

## 2021-10-11 DIAGNOSIS — M797 Fibromyalgia: Secondary | ICD-10-CM

## 2021-10-11 DIAGNOSIS — G43909 Migraine, unspecified, not intractable, without status migrainosus: Secondary | ICD-10-CM

## 2021-10-11 DIAGNOSIS — I10 Essential (primary) hypertension: Secondary | ICD-10-CM

## 2021-10-11 DIAGNOSIS — N1831 Stage 3a chronic kidney disease (HCC): Secondary | ICD-10-CM

## 2021-10-11 DIAGNOSIS — K56609 Unspecified intestinal obstruction, unspecified as to partial versus complete obstruction: Secondary | ICD-10-CM

## 2021-10-11 DIAGNOSIS — E139 Other specified diabetes mellitus without complications: Secondary | ICD-10-CM

## 2021-10-11 DIAGNOSIS — D699 Hemorrhagic condition, unspecified: Secondary | ICD-10-CM

## 2021-10-11 DIAGNOSIS — I8290 Acute embolism and thrombosis of unspecified vein: Secondary | ICD-10-CM

## 2021-10-11 DIAGNOSIS — F331 Major depressive disorder, recurrent, moderate: Secondary | ICD-10-CM

## 2021-10-11 DIAGNOSIS — K5090 Crohn's disease, unspecified, without complications: Secondary | ICD-10-CM

## 2021-10-11 DIAGNOSIS — K639 Disease of intestine, unspecified: Secondary | ICD-10-CM

## 2021-10-11 DIAGNOSIS — K50918 Crohn's disease, unspecified, with other complication: Secondary | ICD-10-CM

## 2021-10-11 DIAGNOSIS — R42 Dizziness and giddiness: Secondary | ICD-10-CM

## 2021-10-11 DIAGNOSIS — N183 CKD (chronic kidney disease) stage 3, GFR 30-59 ml/min (HCC): Secondary | ICD-10-CM

## 2021-10-11 DIAGNOSIS — G40909 Epilepsy, unspecified, not intractable, without status epilepticus: Secondary | ICD-10-CM

## 2021-10-11 DIAGNOSIS — E1122 Type 2 diabetes mellitus with diabetic chronic kidney disease: Secondary | ICD-10-CM

## 2021-10-11 MED ORDER — LOSARTAN 50 MG PO TAB
50 mg | ORAL_TABLET | Freq: Every day | ORAL | 3 refills | 90.00000 days | Status: AC
Start: 2021-10-11 — End: ?

## 2021-10-11 MED ORDER — DULOXETINE 60 MG PO CPDR
60 mg | ORAL_CAPSULE | Freq: Every day | ORAL | 3 refills | 60.00000 days | Status: AC
Start: 2021-10-11 — End: ?

## 2021-10-11 NOTE — Progress Notes
Tina Snyder is a 56 y.o. female.    INTERVAL hx    We had cross tapered duloxetine to elavil due to poor sleep.  Did not tolerate.  Tina Snyder was started on lunesta by pulmonary.  Ultimately stopped this because side effects    Tina Snyder BP has been high. Tina Snyder has been having more headaches.   Was previously on lasix, spironolactone. Just taking by    Otherwise doing great.       CHRONIC hx  Depression  Wellbutrin 300mg ,   Prior: Fluoxetine 10mg  Prozac, Duloxetine 60mg  daily, elavil 50mg , elavil 10mg  (not taking)  H.o hospitalization as a teenage for SI.  Tina Snyder had a nervous breakdown in 2004 and was hospitalized for 3 days.      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 Tina Snyder was briefly on coumadin but stopped in setting of recurrent bleeding. 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 Recent CT imaging showed a lot of chest wall collaterals.  Pt not aware.  Not on any therapy.  [place] of Tina Snyder left arm showed no DVT. Tina Snyder had a negative VQ scan for PE. Tina Snyder had a CT chest 2016 Development of anterior superior mediastinal vascular collaterals secondary to marked narrowing or occlusion of the left brachiocephalic vein. 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  Tina Snyder had a CTA chest in 2019 with Dr. Hollie Beach and left brachiocephalic vein atresia noted with prominent collaterals. Dr. Gaspar Bidding felt Tina Snyder may need other systemic evaluation to help determine the cause of Tina Snyder swelling.  Tina Snyder anti CCP and RF were negative in 2020.    Peripheral edema:  Progressive edema in Tina Snyder bilateral arms, underarms and chest wall and additionally in Tina Snyder feet. Started in LUE predominantly. Noted in RUE after a prolonged hospital stay in 2019 where Tina Snyder lay on Tina Snyder right side.  Tina Snyder has been on lasix since 2018.  Tina Snyder has a history of subclavian thrombus with chest wall collaterals in addition to intermittent steroids.  Hands are getting thicker, had to change ring size.  Ruddy skin complexion is new.     History of Iron Deficiency Anemia  Resolved with IV iron.    History of Syncope:  Tina Snyder has seen cardiology. Diltiazem held due to orthostatic concerns. Tina Snyder had a TTE on 07/02/2017 which showed EF 70%.  Carotid u/s was unremarkable. Tina Snyder had an EEG on 06/05/2017 which was fine.   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 Chronic NSAID use considered.     Crohns  [facility], GI  Budesonide 3mg ,  entyvio   Prior: mtx (fracture), entecort    Chronic Pancreatitis  [facility], [facility]  Drx in August 2021 after discovery following SBO.  Lipase > 1000.   EUS in 02/2020 showed evidence of chronic pancreatitis.     MAI:   [place], Pul[facility].   Tina Snyder has been on three times weekly azithromycin in the past.     Diabetes Management:  Current regimen: glargine 30 w/ aspart 16-20 w/ meals  Endocrine:  Yes We have been avoiding SGLT2/GLP-1 in this patient due to recurrent yeast infections as well as Crohn's disease.  Complications:  Nephropathy, Gastropathy/Delayed gastric emptying, CAD, PVD  Poc Hemoglobin A1C   Date Value Ref Range Status   08/23/2021 6.6 (H) 4 - 6 % Final   12/27/2020 6.2 (A) 4 - 6 % Final   09/05/2020 6.4 (  A) 4 - 6 % Final     Lab Results   Component Value Date/Time    HGBA1C 6.8 (H) 10/14/2019 02:46 PM    HGBA1C 9.1 (H) 11/16/2018 11:13 AM    HGBA1C 9.3 (H) 10/24/2015 03:50 AM    A1C 6.6 (H) 08/23/2021 02:10 PM    CHOL 132 08/27/2021 01:40 PM    TRIG 104 08/27/2021 01:40 PM    HDL 43 08/27/2021 01:40 PM    LDL 73 08/27/2021 01:40 PM    VLDL 21 08/27/2021 01:40 PM    NONHDLCHOL 89 08/27/2021 01:40 PM    CR 1.15 (H) 10/05/2021 02:50 PM    MCALBR 21.3 08/27/2021 01:40 PM    Imp: Diabetes unknown     Hypertension Management:    Current regimen: bystolic 10mg  daily  Prior: hydralazine 25mg  three times a day, , diltiazem 240mg  daily,bumex 1mg ,  Spironolactone 50mg  tablet, Entivio   CVD Hx: none  Smoker: No  Outside blood pressures being performed: Yes  BP Readings from Last 3 Encounters:   10/11/21 (!) 152/110   10/05/21 (!) 168/84   08/23/21 (!) 163/93     Fibromyalgia  Prior: Duloxetine 40mg  daily, flexeril prn    Sleep Apnea  CPAP  Sleep study 10/03/2017    Social:  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   HENT: Negative for congestion and sore throat.    Eyes: Negative for visual disturbance.   Respiratory: Negative for cough, chest tightness, shortness of breath and wheezing.    Cardiovascular: Negative for chest pain and leg swelling.   Gastrointestinal: Negative for abdominal pain, blood in stool, nausea and vomiting.   Genitourinary: Negative for difficulty urinating and urgency.   Musculoskeletal: Negative for arthralgias, joint swelling, myalgias and neck pain.   Skin: Negative for color change and rash.   Neurological: Negative for dizziness, speech difficulty, weakness, light-headedness, numbness and headaches.   Psychiatric/Behavioral: Negative for agitation, behavioral problems, confusion, decreased concentration, dysphoric mood, hallucinations, self-injury, sleep disturbance and suicidal ideas. The patient is not nervous/anxious and is not hyperactive.          Objective:         ? atorvastatin (LIPITOR) 40 mg tablet Take one tablet by mouth daily.   ? Blood-Glucose Sensor (DEXCOM G6 SENSOR) sensor device Use one each as directed as directed. Change every 10 days   ? budesonide (ENTOCORT EC) 3 mg capsule TAKE 2 CAPSULES BY MOUTH EVERY DAY   ? buPROPion XL (WELLBUTRIN XL) 300 mg tablet TAKE ONE TABLET BY MOUTH EVERY MORNING. DO NOT CRUSH OR CHEW.   ? cyclobenzaprine (FLEXERIL) 10 mg tablet TAKE 1 TABLET BY MOUTH THREE TIMES A DAY (Patient taking differently: at bedtime daily.)   ? dapagliflozin (FARXIGA) 5 mg tablet Take one tablet by mouth daily. ? DEXCOM G7 SENSOR sensor device Use one each as directed before meals and at bedtime. Indications: type 2 diabetes mellitus   ? dexlansoprazole (DEXILANT) 60 mg capsule Take one capsule by mouth daily. Indications: gastroesophageal reflux disease   ? diclofenac sodium (VOLTAREN) 1 % topical gel Apply four g topically to affected area four times daily.   ? duloxetine DR (CYMBALTA) 60 mg capsule Take one capsule by mouth daily.   ? EPINEPHrine (AUVI-Q) 1 mg/mL injection pen (2-Pack)  Inject 0.3 mg (1 Pen) into thigh if needed for anaphylactic reaction. May repeat in 5-15 minutes if needed.   ? famotidine (PEPCID) 20 mg tablet TAKE ONE TABLET BY MOUTH AT BEDTIME DAILY.   ? glucagon (BAQSIMI) 3 mg/actuation nasal spray Use 1 spray in single nostril. If no response, may repeat in 15 minutes using second device.   ? insulin aspart (U-100) (NOVOLOG FLEXPEN U-100 INSULIN) 100 unit/mL (3 mL) PEN Inject 20 units with meals plus correction. Max 80 units/day.   ? insulin glargine (BASAGLAR KWIKPEN U-100 INSULIN) 100 unit/mL (3 mL) subcutaneous PEN Inject thirty four Units under the skin at bedtime daily. Increase by 2 units every 3 days if fasting blood sugars are over 130 mg/dl. MAX daily 40 units.  Indications: type 2 diabetes mellitus   ? losartan (COZAAR) 50 mg tablet Take one tablet by mouth daily.   ? nebivolol (BYSTOLIC) 10 mg tablet Take one tablet by mouth daily.   ? ondansetron HCL (ZOFRAN) 4 mg tablet Take one tablet by mouth every 8 hours.   ? pantoprazole DR (PROTONIX) 40 mg tablet Take one tablet by mouth daily.   ? pen needle, diabetic (BD NANO 2ND GEN PEN NEEDLE) 32 gauge x 5/32 pen needle Use one each as directed before meals and at bedtime. Use with insulin injections.   ? polyethylene glycol 3350 (MIRALAX) 17 gram/dose powder Take seventeen g by mouth twice daily.   ? promethazine (PHENERGAN) 25 mg tablet Take one tablet by mouth every 6 hours as needed for Nausea or Vomiting.   ? triamcinolone acetonide (KENALOG) 0.1 % topical ointment Apply  topically to affected area twice daily.   ? vedolizumab (ENTYVIO IV) Administer  through vein every 8 weeks.     Vitals:    10/11/21 1132 10/11/21 1136   BP: (!) 160/101 (!) 152/110   BP Source: Arm, Right Upper Arm, Left Upper   Pulse: 67 69   Resp: 18    SpO2: 97%    PainSc: Five    Weight: 90.3 kg (199 lb)    Height: 162.6 cm (5' 4)      Vitals:    10/11/21 1132 10/11/21 1136   BP: (!) 160/101 (!) 152/110   BP Source: Arm, Right Upper Arm, Left Upper   Pulse: 67 69   Resp: 18    SpO2: 97%    PainSc: Five    Weight: 90.3 kg (199 lb)    Height: 162.6 cm (5' 4)        Body mass index is 34.16 kg/m?Marland Kitchen     Physical Exam  Vitals and nursing note reviewed.   Constitutional:       Appearance: Normal appearance. Tina Snyder is well-developed.   HENT:      Head: Normocephalic and atraumatic.   Eyes:      Conjunctiva/sclera: Conjunctivae normal.   Cardiovascular:      Rate and Rhythm: Normal rate.   Pulmonary:      Effort: Pulmonary effort is normal.      Breath sounds: Normal breath sounds.   Abdominal:      General: Bowel sounds are normal.      Palpations: Abdomen is soft.   Musculoskeletal:         General: Normal range of motion.      Cervical back: Normal range of motion.   Skin:     General: Skin is warm and dry.   Neurological:      Mental Status: Tina Snyder is  alert.   Psychiatric:         Mood and Affect: Mood normal.         Behavior: Behavior normal.         Thought Content: Thought content normal.         Judgment: Judgment normal.         Labwork reviewed:  Lab Results   Component Value Date/Time    HGBA1C 6.8 (H) 10/14/2019 02:46 PM    HGBA1C 9.1 (H) 11/16/2018 11:13 AM    HGBA1C 9.3 (H) 10/24/2015 03:50 AM    A1C 6.6 (H) 08/23/2021 02:10 PM    MCALBR 21.3 08/27/2021 01:40 PM    TSH 2.39 08/27/2021 01:40 PM    FREET4R 0.7 09/25/2007 12:30 PM    CHOL 132 08/27/2021 01:40 PM    TRIG 104 08/27/2021 01:40 PM    HDL 43 08/27/2021 01:40 PM    LDL 73 08/27/2021 01:40 PM    NA 140 10/05/2021 02:50 PM    K 4.6 10/05/2021 02:50 PM    CL 105 10/05/2021 02:50 PM    CO2 25 10/05/2021 02:50 PM    GAP 10 10/05/2021 02:50 PM    BUN 21 10/05/2021 02:50 PM    CR 1.15 (H) 10/05/2021 02:50 PM    GLU 77 10/05/2021 02:50 PM    CA 9.2 10/05/2021 02:50 PM    PO4 3.4 11/03/2020 11:07 AM    ALBUMIN 4.0 10/05/2021 02:50 PM    TOTPROT 6.7 10/05/2021 02:50 PM    ALKPHOS 138 (H) 10/05/2021 02:50 PM    AST 22 10/05/2021 02:50 PM    ALT 15 10/05/2021 02:50 PM    TOTBILI 0.3 10/05/2021 02:50 PM    GFR 43 (L) 03/23/2020 01:04 PM    GFRAA 52 (L) 03/23/2020 01:04 PM            Assessment and Plan:    Vanessia L. Gonterman was seen today    Diagnoses and all orders for this visit:    Preventive Care  BMI and mood addressed.   > shingles + covid  > annual blood work reviewed  > colonoscopy ordered    Leg Pain  High risk DVT.   Duration a few mo.  > u/s    Hypertension  CKD  Not controlled  > continue bystolic  > start losartan 50mg   > Tina Snyder has BMP already ordered, expect a little bump    Depression - Major- partial  Fibromyalgia  Doing OK, but would like to restart duloxetine  > restart duloxetine  > continue wellbutrin to 300  > amb ref behavioral health for adjustment reaction to acute illness at prior visits  > encourage ongoing discussion w/ pastor.    Crohn's disease of small intestine with other complication (HCC):   Chronic and active. Normal stool.   > colonoscopy surveillance due, ordered  > reviewed recommendations  > b12 monthly    Elevated Alk Phos / Normal GGTP  CKD3  Reviewed work up which has included liver u/s and bone scan were all reassuring. Certainly at risk for sclerosing cholangitis w/ Tina Snyder history of crohns but the GGTP was normal.  This can all be renal associated.   We can continue to follow.    BMI 36-> 34  Tina Snyder is losing weight through diet and exercise. Congratulated efforts.      Type 2 diabetes mellitus without complication, without long-term current use of insulin (HCC):   Follows in endocrine here with scheduled followup  after the long weekend.   Excellent control  > LDL at goal 70  > BP at goal    Essential hypertension: Better control. Following closely w/ cardiology.    Mycobacterium avium-intracellulare complex (HCC)  > Inactive. Following w/ [facility] currently     OSA: Tolerating new CPAP    Peripheral Edema:   Post Thrombotic Syndrome  I continue to suspect this is post thrombotic syndrome. Upper >> lower extremity. Tina Snyder recent CTA demonstrates chronic left brachial brachiocephalic vein atresia, Dr. Hollie Beach does not feel this is responsible for Tina Snyder edema.  Tina Snyder has had significant improvement with lymphedema therapy and will continue to do so.                                Patient Instructions   1.  Lets restart duloxetine at 60  2.  Lets start losartan, daytime.  OK to increase losartan to 100mg  if your blood still not controlled in visits.   3. We will recheck your kidney function with our routine labs      No follow-ups on file.   Answers for HPI/ROS submitted by the patient on 12/21/2019  What medical problem brings you in to see the provider?: Follow up from hospital stay and medication adjustment  Your symptom is...: recurrent  When did they start?: more than 1 month ago  How often do you feel this symptom?: constantly  How has the symptom changed?: unchanged  On a scale of 0 to 10 (10 being the worst), how strong is your pain?: 7/10  How severe is your pain?: moderate  No appetite: No  Change in bowel habit: Yes  Swollen glands: No  Urinary symptoms: No  Vertigo: Yes  Visual change: No  Which of the following, if any, make(s) your symptom worse?: drinking, eating, exertion, stress  Which of the following treatments have you tried?: heat, ice, immobilization, lying down, rest, sleep, walking  If you've tried a treatment for this problem, how much relief did you experience?: moderate      Answers for HPI/ROS submitted by the patient on 08/30/2020  What medical problem brings you in to see the provider?: Depression, Numbness is feet, arms and hands  Your symptom is...: recurrent  When did they start?: 1 to 4 weeks ago  How often do you feel this symptom?: 2 to 4 times per day  How has the symptom changed?: unchanged  On a scale of 0 to 10 (10 being the worst), how strong is your pain?: 6/10  How severe is your pain?: moderate  No appetite: Yes  Change in bowel habit: No  Swollen glands: No  Urinary symptoms: No  Vertigo: No  Visual change: No  Which of the following, if any, make(s) your symptom worse?: bending, standing, stress, walking  Which of the following treatments have you tried?: acetaminophen, heat, ice, NSAIDs, position changes, rest, sleep, walking  If you've tried a treatment for this problem, how much relief did you experience?: no relief      Answers for HPI/ROS submitted by the patient on 11/21/2020  What medical problem brings you in to see the provider?: Test results and medicine check  Your symptom is...: recurrent  How has the symptom changed?: waxing and waning  On a scale of 0 to 10 (10 being the worst), how strong is your pain?: 7/10  How severe is your pain?: moderate  No appetite: Yes  Change in bowel habit: Yes  Swollen glands: Yes  Urinary symptoms: No  Vertigo: No  Visual change: Yes  Which of the following, if any, make(s) your symptom worse?: bending, exertion, intercourse, standing, stress, twisting, walking  Which of the following treatments have you tried?: acetaminophen, heat, ice, lying down, NSAIDs, oral narcotics, position changes, relaxation, rest, sleep, walking  If you've tried a treatment for this problem, how much relief did you experience?: mild

## 2021-10-12 ENCOUNTER — Ambulatory Visit: Admit: 2021-10-12 | Discharge: 2021-10-12 | Payer: BC Managed Care – PPO

## 2021-10-12 ENCOUNTER — Encounter: Admit: 2021-10-12 | Discharge: 2021-10-12 | Payer: BC Managed Care – PPO

## 2021-10-12 DIAGNOSIS — M797 Fibromyalgia: Secondary | ICD-10-CM

## 2021-10-12 DIAGNOSIS — M79652 Pain in left thigh: Secondary | ICD-10-CM

## 2021-10-12 DIAGNOSIS — Z1211 Encounter for screening for malignant neoplasm of colon: Secondary | ICD-10-CM

## 2021-10-24 ENCOUNTER — Encounter: Admit: 2021-10-24 | Discharge: 2021-10-24 | Payer: BC Managed Care – PPO

## 2021-10-27 ENCOUNTER — Encounter: Admit: 2021-10-27 | Discharge: 2021-10-27 | Payer: BC Managed Care – PPO

## 2021-10-27 MED ORDER — FAMOTIDINE 20 MG PO TAB
20 mg | ORAL_TABLET | Freq: Every evening | ORAL | 1 refills
Start: 2021-10-27 — End: ?

## 2021-10-30 ENCOUNTER — Encounter: Admit: 2021-10-30 | Discharge: 2021-10-30 | Payer: BC Managed Care – PPO

## 2021-10-30 ENCOUNTER — Ambulatory Visit: Admit: 2021-10-30 | Discharge: 2021-10-30 | Payer: BC Managed Care – PPO

## 2021-10-30 DIAGNOSIS — K639 Disease of intestine, unspecified: Secondary | ICD-10-CM

## 2021-10-30 DIAGNOSIS — N183 CKD (chronic kidney disease) stage 3, GFR 30-59 ml/min (HCC): Secondary | ICD-10-CM

## 2021-10-30 DIAGNOSIS — E119 Type 2 diabetes mellitus without complications: Secondary | ICD-10-CM

## 2021-10-30 DIAGNOSIS — J984 Other disorders of lung: Secondary | ICD-10-CM

## 2021-10-30 DIAGNOSIS — K56609 Unspecified intestinal obstruction, unspecified as to partial versus complete obstruction: Secondary | ICD-10-CM

## 2021-10-30 DIAGNOSIS — I8290 Acute embolism and thrombosis of unspecified vein: Secondary | ICD-10-CM

## 2021-10-30 DIAGNOSIS — Z789 Other specified health status: Secondary | ICD-10-CM

## 2021-10-30 DIAGNOSIS — R42 Dizziness and giddiness: Secondary | ICD-10-CM

## 2021-10-30 DIAGNOSIS — J302 Other seasonal allergic rhinitis: Secondary | ICD-10-CM

## 2021-10-30 DIAGNOSIS — K7581 Nonalcoholic steatohepatitis (NASH): Secondary | ICD-10-CM

## 2021-10-30 DIAGNOSIS — K219 Gastro-esophageal reflux disease without esophagitis: Secondary | ICD-10-CM

## 2021-10-30 DIAGNOSIS — I82409 Acute embolism and thrombosis of unspecified deep veins of unspecified lower extremity: Secondary | ICD-10-CM

## 2021-10-30 DIAGNOSIS — H903 Sensorineural hearing loss, bilateral: Secondary | ICD-10-CM

## 2021-10-30 DIAGNOSIS — K5090 Crohn's disease, unspecified, without complications: Secondary | ICD-10-CM

## 2021-10-30 DIAGNOSIS — D699 Hemorrhagic condition, unspecified: Secondary | ICD-10-CM

## 2021-10-30 DIAGNOSIS — E785 Hyperlipidemia, unspecified: Secondary | ICD-10-CM

## 2021-10-30 DIAGNOSIS — G43909 Migraine, unspecified, not intractable, without status migrainosus: Secondary | ICD-10-CM

## 2021-10-30 DIAGNOSIS — G40909 Epilepsy, unspecified, not intractable, without status epilepticus: Secondary | ICD-10-CM

## 2021-10-30 DIAGNOSIS — H9313 Tinnitus, bilateral: Secondary | ICD-10-CM

## 2021-10-30 DIAGNOSIS — K859 Acute pancreatitis without necrosis or infection, unspecified: Secondary | ICD-10-CM

## 2021-10-30 DIAGNOSIS — H9190 Unspecified hearing loss, unspecified ear: Secondary | ICD-10-CM

## 2021-10-30 DIAGNOSIS — E139 Other specified diabetes mellitus without complications: Secondary | ICD-10-CM

## 2021-10-30 DIAGNOSIS — IMO0002 Ulcer: Secondary | ICD-10-CM

## 2021-10-30 DIAGNOSIS — Z8619 Personal history of other infectious and parasitic diseases: Secondary | ICD-10-CM

## 2021-10-30 DIAGNOSIS — I10 Essential (primary) hypertension: Secondary | ICD-10-CM

## 2021-10-30 DIAGNOSIS — M797 Fibromyalgia: Secondary | ICD-10-CM

## 2021-10-30 DIAGNOSIS — D4959 Neoplasm of unspecified behavior of other genitourinary organ: Secondary | ICD-10-CM

## 2021-10-30 NOTE — Progress Notes
Date of Service: 10/30/2021    Subjective:             Tina Snyder is a 56 y.o. female.    History of Present Illness    Follow up ear issues.  Notes persistent tinnitus, was only right sided now bilateral.  Last seen in May 2021 for ear tube placement on the right secondary to CSOM.  This has resolved and the tube reportedly has fallen out.  Notes increased hearing loss bilaterally as well.         Review of Systems   Constitutional: Negative.    HENT: Negative.    Eyes: Negative.    Respiratory: Negative.    Cardiovascular: Negative.    Gastrointestinal: Negative.    Endocrine: Negative.    Genitourinary: Negative.    Musculoskeletal: Negative.    Skin: Negative.    Allergic/Immunologic: Negative.    Neurological: Positive for dizziness and headaches.   Hematological: Negative.    Psychiatric/Behavioral: Negative.          Objective:         ? atorvastatin (LIPITOR) 40 mg tablet Take one tablet by mouth daily.   ? Blood-Glucose Sensor (DEXCOM G6 SENSOR) sensor device Use one each as directed as directed. Change every 10 days   ? budesonide (ENTOCORT EC) 3 mg capsule TAKE 2 CAPSULES BY MOUTH EVERY DAY   ? buPROPion XL (WELLBUTRIN XL) 300 mg tablet TAKE ONE TABLET BY MOUTH EVERY MORNING. DO NOT CRUSH OR CHEW.   ? cyclobenzaprine (FLEXERIL) 10 mg tablet TAKE 1 TABLET BY MOUTH THREE TIMES A DAY (Patient taking differently: at bedtime daily.)   ? dapagliflozin (FARXIGA) 5 mg tablet Take one tablet by mouth daily.   ? DEXCOM G7 SENSOR sensor device Use one each as directed before meals and at bedtime. Indications: type 2 diabetes mellitus   ? dexlansoprazole (DEXILANT) 60 mg capsule Take one capsule by mouth daily. Indications: gastroesophageal reflux disease   ? diclofenac sodium (VOLTAREN) 1 % topical gel Apply four g topically to affected area four times daily.   ? duloxetine DR (CYMBALTA) 60 mg capsule Take one capsule by mouth daily.   ? EPINEPHrine (AUVI-Q) 1 mg/mL injection pen (2-Pack) Inject 0.3 mg (1 Pen) into thigh if needed for anaphylactic reaction. May repeat in 5-15 minutes if needed.   ? famotidine (PEPCID) 20 mg tablet TAKE ONE TABLET BY MOUTH AT BEDTIME DAILY.   ? glucagon (BAQSIMI) 3 mg/actuation nasal spray Use 1 spray in single nostril. If no response, may repeat in 15 minutes using second device.   ? insulin aspart (U-100) (NOVOLOG FLEXPEN U-100 INSULIN) 100 unit/mL (3 mL) PEN Inject 20 units with meals plus correction. Max 80 units/day.   ? insulin glargine (BASAGLAR KWIKPEN U-100 INSULIN) 100 unit/mL (3 mL) subcutaneous PEN Inject thirty four Units under the skin at bedtime daily. Increase by 2 units every 3 days if fasting blood sugars are over 130 mg/dl. MAX daily 40 units.  Indications: type 2 diabetes mellitus   ? losartan (COZAAR) 50 mg tablet Take one tablet by mouth daily.   ? nebivolol (BYSTOLIC) 10 mg tablet Take one tablet by mouth daily.   ? ondansetron HCL (ZOFRAN) 4 mg tablet Take one tablet by mouth every 8 hours.   ? pantoprazole DR (PROTONIX) 40 mg tablet Take one tablet by mouth daily.   ? pen needle, diabetic (BD NANO 2ND GEN PEN NEEDLE) 32 gauge x 5/32 pen needle Use one each as  directed before meals and at bedtime. Use with insulin injections.   ? polyethylene glycol 3350 (MIRALAX) 17 gram/dose powder Take seventeen g by mouth twice daily.   ? promethazine (PHENERGAN) 25 mg tablet Take one tablet by mouth every 6 hours as needed for Nausea or Vomiting.   ? vedolizumab (ENTYVIO IV) Administer  through vein every 8 weeks.     Vitals:    10/30/21 0937   BP: (!) 147/88   Pulse: 67   PainSc: Zero   Weight: 88.9 kg (196 lb)   Height: 162.6 cm (5' 4)     Body mass index is 33.64 kg/m?Marland Kitchen     Physical Exam    General   Well-developed, well-nourished   Communication and Voice:  Clear pitch and clarity, age appropriate   Head and Face   Inspection:  Normocephalic and atraumatic without masses or lesions  Eyes   Nystagmus: None   EOM: Equal extraocular motion bilaterally  ENT   External nose: No scar or anatomic deformity   Internal Nose:  Septum intact and midline.  No edema, polyps, or rhinorrhea   Lips, Teeth, and gums:  Mucosa and teeth intact and viable   Oral cavity/oropharynx:  No erythema or exudate, no nodules, lesions or masses noted  Ear   External canal:  Left - Canal is patent with intact skin                             Right - Canal is patent with intact skin   Tympanic Membranes:  Left - Clear and mobile                                             Right - Clear and mobile   Middle Ears:      Left - appears aerated, no effusion, no masses                           Right - appears aerated, no effusion, no masses  Respiratory   Respiratory effort:  Equal inspiration & expiration without use of accessory muscles. No  stridor  Neuro/Psych/Balance   Orientation: Patient oriented to person, place, and time   Affect: Appropriate mood and affect    Audio reviewed the patient.       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  We discussed the pathophysiology of her hearing loss and tinnitus.  We discussed tinnitus management including cognitive therapy, maskers, and medications.  She will contact her insurance company to discuss hearing aid coverage and can follow up PRN.

## 2021-10-30 NOTE — Patient Instructions
I recommend a repeat hearing test every 1-2 years or for a sudden change in hearing.

## 2021-10-31 MED FILL — DEXCOM G7 SENSOR MISC DEVI: 30 days supply | Qty: 3 | Fill #3 | Status: AC

## 2021-11-06 ENCOUNTER — Encounter: Admit: 2021-11-06 | Discharge: 2021-11-06 | Payer: BC Managed Care – PPO

## 2021-11-06 MED ORDER — CYCLOBENZAPRINE 10 MG PO TAB
10 mg | ORAL_TABLET | Freq: Every evening | ORAL | 0 refills | 30.00000 days | Status: AC
Start: 2021-11-06 — End: ?

## 2021-11-06 NOTE — Telephone Encounter
Some refill protocol elements NOT Met  Medication name: cyclobenzaprine  Medication Strength: 10 mg    Last Fill Date: 10/03/2021    Non-delegated medication    Routed to Provider    Curly [place], RN

## 2021-11-16 ENCOUNTER — Encounter: Admit: 2021-11-16 | Discharge: 2021-11-16 | Payer: BC Managed Care – PPO

## 2021-11-20 ENCOUNTER — Encounter: Admit: 2021-11-20 | Discharge: 2021-11-20 | Payer: BC Managed Care – PPO

## 2021-11-21 ENCOUNTER — Encounter: Admit: 2021-11-21 | Discharge: 2021-11-21 | Payer: BC Managed Care – PPO

## 2021-11-26 ENCOUNTER — Encounter: Admit: 2021-11-26 | Discharge: 2021-11-26 | Payer: BC Managed Care – PPO

## 2021-11-27 ENCOUNTER — Encounter: Admit: 2021-11-27 | Discharge: 2021-11-27 | Payer: BC Managed Care – PPO

## 2021-11-27 DIAGNOSIS — E1165 Type 2 diabetes mellitus with hyperglycemia: Secondary | ICD-10-CM

## 2021-11-27 MED ORDER — INSULIN ASPART 100 UNIT/ML SC FLEXPEN
3 refills | 30.00000 days | Status: AC
Start: 2021-11-27 — End: ?

## 2021-11-27 MED ORDER — INSULIN GLARGINE 100 UNIT/ML (3 ML) SC INJ PEN
34 [IU] | Freq: Every evening | SUBCUTANEOUS | 2 refills | 68.00000 days | Status: AC
Start: 2021-11-27 — End: ?

## 2021-11-27 NOTE — Telephone Encounter
Refill request from pharmacy  LOV 08/23/21; NOV 02/13/22  Refill sent to pharmacy per protocol

## 2021-11-29 ENCOUNTER — Encounter: Admit: 2021-11-29 | Discharge: 2021-11-29 | Payer: BC Managed Care – PPO

## 2021-11-30 ENCOUNTER — Encounter: Admit: 2021-11-30 | Discharge: 2021-11-30 | Payer: BC Managed Care – PPO

## 2021-11-30 ENCOUNTER — Ambulatory Visit: Admit: 2021-11-30 | Discharge: 2021-12-01 | Payer: BC Managed Care – PPO

## 2021-11-30 VITALS — BP 153/70 | HR 79 | Temp 97.00000°F | Resp 18

## 2021-11-30 VITALS — BP 165/97 | HR 76 | Temp 97.00000°F | Resp 18 | Wt 208.0 lb

## 2021-11-30 MED ORDER — DIPHENHYDRAMINE HCL 50 MG/ML IJ SOLN
25 mg | Freq: Once | INTRAVENOUS | 0 refills | Status: CP
Start: 2021-11-30 — End: ?
  Administered 2021-11-30: 20:00:00 25 mg via INTRAVENOUS

## 2021-11-30 MED ORDER — ACETAMINOPHEN 500 MG PO TAB
500 mg | Freq: Once | ORAL | 0 refills | Status: CP
Start: 2021-11-30 — End: ?
  Administered 2021-11-30: 20:00:00 500 mg via ORAL

## 2021-11-30 MED ORDER — DIPHENHYDRAMINE HCL 50 MG/ML IJ SOLN
25 mg | Freq: Once | INTRAVENOUS | 0 refills | Status: CP | PRN
Start: 2021-11-30 — End: ?
  Administered 2021-11-30: 20:00:00 25 mg via INTRAVENOUS

## 2021-11-30 MED ORDER — ACETAMINOPHEN 500 MG PO TAB
500 mg | Freq: Once | ORAL | 0 refills
Start: 2021-11-30 — End: ?

## 2021-11-30 MED ORDER — VEDOLIZUMAB IVPB
300 mg | Freq: Once | INTRAVENOUS | 0 refills
Start: 2021-11-30 — End: ?

## 2021-11-30 MED ORDER — DIPHENHYDRAMINE HCL 50 MG/ML IJ SOLN
25 mg | Freq: Once | INTRAVENOUS | 0 refills
Start: 2021-11-30 — End: ?

## 2021-11-30 MED ORDER — VEDOLIZUMAB IVPB
300 mg | Freq: Once | INTRAVENOUS | 0 refills | Status: CP
Start: 2021-11-30 — End: ?
  Administered 2021-11-30 (×2): 300 mg via INTRAVENOUS

## 2021-11-30 MED FILL — DEXCOM G7 SENSOR MISC DEVI: 30 days supply | Qty: 3 | Fill #4 | Status: AC

## 2021-12-01 DIAGNOSIS — K50918 Crohn's disease, unspecified, with other complication: Principal | ICD-10-CM

## 2021-12-07 ENCOUNTER — Encounter: Admit: 2021-12-07 | Discharge: 2021-12-07 | Payer: BC Managed Care – PPO

## 2021-12-07 NOTE — Telephone Encounter
Spoke w/ Linus Orn. Continue antihistamine. Stop erythromycin topical.  Can apply a thin thin layer of 1% OTC hydrocortisone tonight and see if that helps.

## 2021-12-07 NOTE — Telephone Encounter
Regarding: eyes swelling  Contact: [phone]  ----- Message from Clayton Lefort, RN sent at 12/07/2021  1:22 PM CDT -----       ----- Message sent from Clayton Lefort, RN to Tina Snyder at 12/07/2021  1:22 PM -----   [name],     Thank you for your message and for attaching a picture. It is hard to know what is going on without an evaluation. It is hard to know if this is allergies, possible pink eye or something different altogether. An evaluation is recommended either in our office or at a local urgent care so we ensure correct medication is prescribed especially with the itching and swelling being so close to your eyes. I will forward this to Dr. Patsey Berthold for any further recommendations.     Clayton Lefort, RN      ----- Message -----       From:Tina Snyder       Sent:12/07/2021 10:07 AM CDT         XI:[id number] Domingo Dimes, MD    Subject:eyes swelling    Good morning,    I am having difficulty with my eyes.  I wake up and they are matted shut.  Itchy during the day and have swollen, above eye on eye lid and below eye.  I normally do not have those big eye wrinkles, and I have also been having a lot of itchiness on my scalp, cheeks, chin and forehead.    I have not changed any products I normally use.  I have quit wearing eye makeup, as it just makes my eyes look worse.  Only thing I can think of, I helped Matt clean out a flower bed and we think it had sumac in it, but are not sure.    I have taken bendaryl, claritin and am on flonase nasal spray.  I also have an antibiotic eye ointment from a couple years ago, that I have been applying to my eyelids.  Nothing seems to be helping and it is driving me crazy.  Any suggestion?  I need help.  Tina Snyder.    I attached a picture.

## 2021-12-14 ENCOUNTER — Encounter: Admit: 2021-12-14 | Discharge: 2021-12-14 | Payer: BC Managed Care – PPO

## 2021-12-14 DIAGNOSIS — E1165 Type 2 diabetes mellitus with hyperglycemia: Secondary | ICD-10-CM

## 2021-12-14 MED ORDER — INSULIN ASPART 100 UNIT/ML SC FLEXPEN
40 [IU] | Freq: Three times a day (TID) | SUBCUTANEOUS | 3 refills | 30.00000 days | Status: AC
Start: 2021-12-14 — End: ?

## 2021-12-14 NOTE — Telephone Encounter
Please advise on dose and I can send new script.

## 2021-12-21 ENCOUNTER — Encounter: Admit: 2021-12-21 | Discharge: 2021-12-21 | Payer: BC Managed Care – PPO

## 2021-12-21 MED ORDER — FLUCONAZOLE 150 MG PO TAB
150 mg | ORAL_TABLET | Freq: Once | ORAL | 3 refills | 3.00000 days | Status: AC
Start: 2021-12-21 — End: ?

## 2021-12-29 ENCOUNTER — Encounter: Admit: 2021-12-29 | Discharge: 2021-12-29 | Payer: BC Managed Care – PPO

## 2021-12-30 MED FILL — DEXCOM G7 SENSOR MISC DEVI: 30 days supply | Qty: 3 | Fill #5 | Status: AC

## 2022-01-03 ENCOUNTER — Encounter: Admit: 2022-01-03 | Discharge: 2022-01-03 | Payer: BC Managed Care – PPO

## 2022-01-03 DIAGNOSIS — K5010 Crohn's disease of large intestine without complications: Secondary | ICD-10-CM

## 2022-01-03 MED ORDER — TRAMADOL 50 MG PO TAB
50 mg | ORAL_TABLET | Freq: Three times a day (TID) | ORAL | 0 refills | Status: AC | PRN
Start: 2022-01-03 — End: ?

## 2022-01-24 ENCOUNTER — Encounter: Admit: 2022-01-24 | Discharge: 2022-01-24 | Payer: BC Managed Care – PPO

## 2022-01-25 ENCOUNTER — Encounter: Admit: 2022-01-25 | Discharge: 2022-01-25 | Payer: BC Managed Care – PPO

## 2022-01-25 ENCOUNTER — Ambulatory Visit: Admit: 2022-01-25 | Discharge: 2022-01-26 | Payer: BC Managed Care – PPO

## 2022-01-25 DIAGNOSIS — K50918 Crohn's disease, unspecified, with other complication: Secondary | ICD-10-CM

## 2022-01-25 MED ORDER — ACETAMINOPHEN 500 MG PO TAB
500 mg | Freq: Once | ORAL | 0 refills | Status: CP
Start: 2022-01-25 — End: ?
  Administered 2022-01-25: 20:00:00 500 mg via ORAL

## 2022-01-25 MED ORDER — VEDOLIZUMAB IVPB
300 mg | Freq: Once | INTRAVENOUS | 0 refills
Start: 2022-01-25 — End: ?

## 2022-01-25 MED ORDER — DIPHENHYDRAMINE HCL 50 MG/ML IJ SOLN
25 mg | Freq: Once | INTRAVENOUS | 0 refills | Status: CP
Start: 2022-01-25 — End: ?
  Administered 2022-01-25: 20:00:00 25 mg via INTRAVENOUS

## 2022-01-25 MED ORDER — VEDOLIZUMAB IVPB
300 mg | Freq: Once | INTRAVENOUS | 0 refills | Status: CP
Start: 2022-01-25 — End: ?
  Administered 2022-01-25 (×2): 300 mg via INTRAVENOUS

## 2022-01-25 MED ORDER — DIPHENHYDRAMINE HCL 50 MG/ML IJ SOLN
25 mg | Freq: Once | INTRAVENOUS | 0 refills
Start: 2022-01-25 — End: ?

## 2022-01-25 MED ORDER — DIPHENHYDRAMINE HCL 50 MG/ML IJ SOLN
25 mg | Freq: Once | INTRAVENOUS | 0 refills | Status: CP | PRN
Start: 2022-01-25 — End: ?
  Administered 2022-01-25: 20:00:00 25 mg via INTRAVENOUS

## 2022-01-25 MED ORDER — ACETAMINOPHEN 500 MG PO TAB
500 mg | Freq: Once | ORAL | 0 refills
Start: 2022-01-25 — End: ?

## 2022-01-25 MED FILL — DEXCOM G7 SENSOR MISC DEVI: 30 days supply | Qty: 3 | Fill #6 | Status: AC

## 2022-01-25 NOTE — Progress Notes
Premeds of 548m tylenol and 25 mg benadryl given prior to infusion. Also 25 mg benadryl given halfway thru infusion to prevent itching/hives. Per pt this has prevented reaction for the past 3 treatments.  Today she tolerated infusion of Entyvio without reaction or difficulty.

## 2022-01-27 ENCOUNTER — Encounter: Admit: 2022-01-27 | Discharge: 2022-01-27 | Payer: BC Managed Care – PPO

## 2022-01-27 DIAGNOSIS — E782 Mixed hyperlipidemia: Secondary | ICD-10-CM

## 2022-01-27 MED ORDER — ATORVASTATIN 40 MG PO TAB
40 mg | ORAL_TABLET | Freq: Every day | ORAL | 1 refills | Status: AC
Start: 2022-01-27 — End: ?

## 2022-01-28 ENCOUNTER — Encounter: Admit: 2022-01-28 | Discharge: 2022-01-28 | Payer: BC Managed Care – PPO

## 2022-02-06 ENCOUNTER — Encounter: Admit: 2022-02-06 | Discharge: 2022-02-06 | Payer: BC Managed Care – PPO

## 2022-02-06 DIAGNOSIS — K5010 Crohn's disease of large intestine without complications: Secondary | ICD-10-CM

## 2022-02-06 DIAGNOSIS — F324 Major depressive disorder, single episode, in partial remission: Secondary | ICD-10-CM

## 2022-02-06 MED ORDER — TRAMADOL 50 MG PO TAB
50 mg | ORAL_TABLET | Freq: Three times a day (TID) | ORAL | 0 refills | PRN
Start: 2022-02-06 — End: ?

## 2022-02-06 MED ORDER — BUPROPION XL 300 MG PO TB24
300 mg | ORAL_TABLET | Freq: Every morning | ORAL | 1 refills
Start: 2022-02-06 — End: ?

## 2022-02-06 NOTE — Telephone Encounter
1. Received MyChart request from patient requesting new Rx for Wellbutrin and Tramadol.      2. Last Rx written:    Disp Refills Start End     buPROPion XL (WELLBUTRIN XL) 300 mg tablet 90 tablet 1 08/13/2021     Sig - Route: TAKE ONE TABLET BY MOUTH EVERY MORNING. DO NOT CRUSH OR CHEW. - Oral    Sent to pharmacy as: buPROPion HCL XL 300 mg 24 hr tablet, extended release (WELLBUTRIN XL)    E-Prescribing Status: Receipt confirmed by pharmacy (08/13/2021 2:58 PM CDT)      Disp Refills Start End     traMADoL (ULTRAM) 50 mg tablet 30 tablet 0 01/03/2022     Sig - Route: Take one tablet by mouth three times daily as needed for Pain. - Oral    Sent to pharmacy as: traMADoL 50 mg tablet Veatrice Bourbon)    E-Prescribing Status: Receipt confirmed by pharmacy (01/03/2022 5:11 PM CDT)              3. Last Gen Med Visit:    10/11/21        4. Next Appt due:    unspecified        Future Appointments   Date Time Provider [place]   02/13/2022  3:00 PM Campbell Lerner [id number] IM   03/21/2022  2:30 PM Seward Meth, Tonye Royalty, MD [facility] CVM Exam   03/22/2022  2:30 PM [id number] None   03/26/2022  2:30 PM Patel, Harsh K, MBBS [id number] IM           5. All protocol criteria met? No      Office visit due, Non-delegated medication and UDS/SUA      Routed to Provider

## 2022-02-12 ENCOUNTER — Encounter: Admit: 2022-02-12 | Discharge: 2022-02-12 | Payer: BC Managed Care – PPO

## 2022-02-12 DIAGNOSIS — E1122 Type 2 diabetes mellitus with diabetic chronic kidney disease: Secondary | ICD-10-CM

## 2022-02-13 ENCOUNTER — Encounter: Admit: 2022-02-13 | Discharge: 2022-02-13 | Payer: BC Managed Care – PPO

## 2022-02-13 ENCOUNTER — Ambulatory Visit: Admit: 2022-02-13 | Discharge: 2022-02-13 | Payer: BC Managed Care – PPO

## 2022-02-13 ENCOUNTER — Ambulatory Visit: Admit: 2022-02-13 | Discharge: 2022-02-14 | Payer: BC Managed Care – PPO

## 2022-02-13 DIAGNOSIS — I10 Essential (primary) hypertension: Secondary | ICD-10-CM

## 2022-02-13 DIAGNOSIS — Z789 Other specified health status: Secondary | ICD-10-CM

## 2022-02-13 DIAGNOSIS — K639 Disease of intestine, unspecified: Secondary | ICD-10-CM

## 2022-02-13 DIAGNOSIS — K7581 Nonalcoholic steatohepatitis (NASH): Secondary | ICD-10-CM

## 2022-02-13 DIAGNOSIS — N183 CKD (chronic kidney disease) stage 3, GFR 30-59 ml/min (HCC): Secondary | ICD-10-CM

## 2022-02-13 DIAGNOSIS — D699 Hemorrhagic condition, unspecified: Secondary | ICD-10-CM

## 2022-02-13 DIAGNOSIS — R42 Dizziness and giddiness: Secondary | ICD-10-CM

## 2022-02-13 DIAGNOSIS — I82409 Acute embolism and thrombosis of unspecified deep veins of unspecified lower extremity: Secondary | ICD-10-CM

## 2022-02-13 DIAGNOSIS — E139 Other specified diabetes mellitus without complications: Secondary | ICD-10-CM

## 2022-02-13 DIAGNOSIS — R1013 Epigastric pain: Secondary | ICD-10-CM

## 2022-02-13 DIAGNOSIS — E785 Hyperlipidemia, unspecified: Secondary | ICD-10-CM

## 2022-02-13 DIAGNOSIS — K5090 Crohn's disease, unspecified, without complications: Secondary | ICD-10-CM

## 2022-02-13 DIAGNOSIS — G43909 Migraine, unspecified, not intractable, without status migrainosus: Secondary | ICD-10-CM

## 2022-02-13 DIAGNOSIS — H9190 Unspecified hearing loss, unspecified ear: Secondary | ICD-10-CM

## 2022-02-13 DIAGNOSIS — E1165 Type 2 diabetes mellitus with hyperglycemia: Secondary | ICD-10-CM

## 2022-02-13 DIAGNOSIS — J302 Other seasonal allergic rhinitis: Secondary | ICD-10-CM

## 2022-02-13 DIAGNOSIS — E119 Type 2 diabetes mellitus without complications: Secondary | ICD-10-CM

## 2022-02-13 DIAGNOSIS — I8290 Acute embolism and thrombosis of unspecified vein: Secondary | ICD-10-CM

## 2022-02-13 DIAGNOSIS — M797 Fibromyalgia: Secondary | ICD-10-CM

## 2022-02-13 DIAGNOSIS — G40909 Epilepsy, unspecified, not intractable, without status epilepticus: Secondary | ICD-10-CM

## 2022-02-13 DIAGNOSIS — Z8619 Personal history of other infectious and parasitic diseases: Secondary | ICD-10-CM

## 2022-02-13 DIAGNOSIS — K219 Gastro-esophageal reflux disease without esophagitis: Secondary | ICD-10-CM

## 2022-02-13 DIAGNOSIS — D4959 Neoplasm of unspecified behavior of other genitourinary organ: Secondary | ICD-10-CM

## 2022-02-13 DIAGNOSIS — K859 Acute pancreatitis without necrosis or infection, unspecified: Secondary | ICD-10-CM

## 2022-02-13 DIAGNOSIS — K56609 Unspecified intestinal obstruction, unspecified as to partial versus complete obstruction: Secondary | ICD-10-CM

## 2022-02-13 DIAGNOSIS — J984 Other disorders of lung: Secondary | ICD-10-CM

## 2022-02-13 DIAGNOSIS — IMO0002 Ulcer: Secondary | ICD-10-CM

## 2022-02-13 LAB — COMPREHENSIVE METABOLIC PANEL
ALBUMIN: 4.1 g/dL (ref 3.5–5.0)
ALK PHOSPHATASE: 142 U/L — ABNORMAL HIGH (ref 25–110)
ALT: 20 U/L (ref 7–56)
ANION GAP: 10 (ref 3–12)
AST: 18 U/L (ref 7–40)
BLD UREA NITROGEN: 24 mg/dL (ref 7–25)
CALCIUM: 9.2 mg/dL (ref 8.5–10.6)
CHLORIDE: 103 MMOL/L (ref 98–110)
CO2: 25 MMOL/L (ref 21–30)
CREATININE: 1.3 mg/dL — ABNORMAL HIGH (ref 0.4–1.00)
EGFR: 46 mL/min — ABNORMAL LOW (ref >60)
POTASSIUM: 4.4 MMOL/L (ref 3.5–5.1)
SODIUM: 138 MMOL/L (ref 137–147)
TOTAL BILIRUBIN: 0.5 mg/dL (ref 0.3–1.2)
TOTAL PROTEIN: 7 g/dL (ref 6.0–8.0)

## 2022-02-13 LAB — LIPASE: LIPASE: 27 U/L (ref 11–82)

## 2022-02-13 MED ORDER — LOSARTAN 100 MG PO TAB
100 mg | ORAL_TABLET | Freq: Every day | ORAL | 3 refills | 90.00000 days | Status: AC
Start: 2022-02-13 — End: ?

## 2022-02-13 MED ORDER — TOUJEO SOLOSTAR U-300 INSULIN 300 UNIT/ML (1.5 ML) SC INPN
46 [IU] | Freq: Every day | SUBCUTANEOUS | 3 refills | 68.00000 days | Status: AC
Start: 2022-02-13 — End: ?

## 2022-02-13 NOTE — Progress Notes
Subjective:       Diabetes      Tina Snyder is a 56 y.o. female who was seen today as a follow up for type 2 DM.     Interval changes:   Has been under a lot more stress lately with several recent deaths of family and friends.   She has not been sleeping well and has been having more comfort foods lately (usually cream of wheat with milk).   She is disappointed in the weight re-gain that she has had since this summer.     Appetite is hit or miss with her Crohn's.    ?  Summary of Glucose Control:??  Lab Results   Component Value Date    A1C 6.2 (A) 02/13/2022    A1C 6.6 (H) 08/23/2021    GLUPOC 101 (A) 02/13/2022     Dexcom download:        -----------------------------  Dexcom Clarity  -----------------------------  Tina Snyder  Date of Birth: 09-Nov-1965  Generated at: Wed, Feb 13, 2022 3:30 PM CDT  Reporting period: Tue Jan 15, 2022 - Wed Feb 13, 2022  -----------------------------  Glucose Details  Average glucose: 172 mg/dL  Standard deviation: 46 mg/dL  GMI: 1.6%  -----------------------------  Time in Range  Very High: 4%  High: 37%  In Range: 58%  Low: <1%  Very Low: <1%    Target Range  70-180 mg/dL    -----------------------------  CGM Details  Sensor usage: 100%  Days with CGM data: 30/30    Trends:   - Universal mild hyperglycemia  - some post prandial spikes after lunch and supper    ?  Type 2 DM:   Dx: 2012  Current regimen:  Levemir 40-45 units daily, Novolog 30 units with meals, Farxiga 5 mg daily    Frequency of hypoglycemia infrequent  ? How many meals per day??3 meals with snack ?  Weight changes: none--trying to lose weight   Exercise? no   Lipid: Lipitor 40 mg daily    Eye exam: today   Foot exam: March 2023         Family History of diabetes: none, son with celiac  Social History: lives with her husband, [place], in [place]. Does not drink or smoke.    ?  Medical History:   Diagnosis Date   ? Bleeding tendency (HCC)    ? 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 Bowel disease    ? CKD (chronic kidney disease) stage 3, GFR 30-59 ml/min (HCC)    ? Crohn disease (HCC)    ? Deep vein thrombosis (DVT) (HCC)    ? Difficult intravenous access    ? Dizziness    ? DM (diabetes mellitus), secondary-steroid-induced 2012   ? Fibromyalgia    ? GERD (gastroesophageal reflux disease)    ? H/O Mycobacterium avium intracellulare infection    ? Hearing loss    ? Hyperlipidemia    ? Hypertension    ? Hypertensive heart disease    ? Lung disease    ? Migraines    ? Pancreatitis    ? Seasonal allergic reaction    ? Seizure disorder (HCC)    ? Small bowel obstruction (HCC)    ? Steatohepatitis     felt secondary to medications   ? Type 2 diabetes mellitus without complication, with long-term current use of insulin (HCC) 10/22/2017   ? Ulcer    ? Vaginal tumor  Review of Systems  Positive for stomach pain, shooting pains in feet/legs.       Objective:         ? atorvastatin (LIPITOR) 40 mg tablet TAKE 1 TABLET BY MOUTH DAILY   ? Blood-Glucose Sensor (DEXCOM G6 SENSOR) sensor device Use one each as directed as directed. Change every 10 days   ? budesonide (ENTOCORT EC) 3 mg capsule TAKE 2 CAPSULES BY MOUTH EVERY DAY   ? buPROPion XL (WELLBUTRIN XL) 300 mg tablet Take one tablet by mouth every morning. Do not crush or chew.   ? cyclobenzaprine (FLEXERIL) 10 mg tablet Take one tablet by mouth at bedtime daily.   ? dapagliflozin (FARXIGA) 5 mg tablet Take one tablet by mouth daily.   ? DEXCOM G7 SENSOR sensor device Use one each as directed before meals and at bedtime. Indications: type 2 diabetes mellitus   ? dexlansoprazole (DEXILANT) 60 mg capsule Take one capsule by mouth daily. Indications: gastroesophageal reflux disease   ? diclofenac sodium (VOLTAREN) 1 % topical gel Apply four g topically to affected area four times daily.   ? duloxetine DR (CYMBALTA) 60 mg capsule Take one capsule by mouth daily.   ? EPINEPHrine (AUVI-Q) 1 mg/mL injection pen (2-Pack) Inject 0.3 mg (1 Pen) into thigh if needed for anaphylactic reaction. May repeat in 5-15 minutes if needed.   ? famotidine (PEPCID) 20 mg tablet TAKE ONE TABLET BY MOUTH AT BEDTIME DAILY.   ? glucagon (BAQSIMI) 3 mg/actuation nasal spray Use 1 spray in single nostril. If no response, may repeat in 15 minutes using second device.   ? insulin aspart (U-100) (NOVOLOG FLEXPEN U-100 INSULIN) 100 unit/mL (3 mL) PEN Inject forty Units under the skin three times daily with meals. Indications: e11.65 (Patient taking differently: Inject ten Units to twenty seven Units under the skin three times daily with meals. Indications: e11.65)   ? insulin detemir U-100 (LEVEMIR FLEXPEN) 100 unit/mL (3 mL) injection pen Inject thirty four Units under the skin daily. (Patient taking differently: Inject forty Units to forty five Units under the skin daily.)   ? losartan (COZAAR) 50 mg tablet Take one tablet by mouth daily.   ? nebivolol (BYSTOLIC) 10 mg tablet Take one tablet by mouth daily.   ? ondansetron HCL (ZOFRAN) 4 mg tablet Take one tablet by mouth every 8 hours.   ? pantoprazole DR (PROTONIX) 40 mg tablet Take one tablet by mouth daily.   ? pen needle, diabetic (BD NANO 2ND GEN PEN NEEDLE) 32 gauge x 5/32 pen needle Use one each as directed before meals and at bedtime. Use with insulin injections.   ? polyethylene glycol 3350 (MIRALAX) 17 gram/dose powder Take seventeen g by mouth twice daily.   ? promethazine (PHENERGAN) 25 mg tablet Take one tablet by mouth every 6 hours as needed for Nausea or Vomiting.   ? traMADoL (ULTRAM) 50 mg tablet Take one tablet by mouth three times daily as needed for Pain.   ? triamcinolone acetonide (TRIDERM) 0.1 % topical cream Apply  topically to affected area twice daily.   ? vedolizumab (ENTYVIO IV) Administer  through vein every 8 weeks.      Telehealth Patient Reported Vitals     Row Name 02/13/22 1433                Pain Score Zero        Patient Position Sitting        Resp 18  BP Source Arm, Left Upper                  Vitals: 02/13/22 1433   BP: (!) 147/102   Pulse: 76   Resp: 18   SpO2: 100%     Body mass index is 38.48 kg/m?Marland Kitchen      Physical Exam  Vitals and nursing note reviewed.   Constitutional:       General: She is not in acute distress.     Appearance: Normal appearance. She is not ill-appearing.   HENT:      Head: Normocephalic and atraumatic.      Nose: Nose normal.   Eyes:      Conjunctiva/sclera: Conjunctivae normal.   Neurological:      Mental Status: She is alert and oriented to person, place, and time.   Psychiatric:         Mood and Affect: Mood normal.         Behavior: Behavior normal.         Thought Content: Thought content normal.         Judgment: Judgment normal.     Diabetic Foot Exam       Bilateral vascular, sensation, integument are normal:  Yes except dry skin    Labs:   Comprehensive Metabolic Profile    Lab Results   Component Value Date/Time    NA 138 01/25/2022 03:00 PM    K 4.8 01/25/2022 03:00 PM    CL 104 01/25/2022 03:00 PM    CO2 24 01/25/2022 03:00 PM    GAP 10 01/25/2022 03:00 PM    BUN 21 01/25/2022 03:00 PM    CR 1.19 (H) 01/25/2022 03:00 PM    GLU 83 01/25/2022 03:00 PM    Lab Results   Component Value Date/Time    CA 8.9 01/25/2022 03:00 PM    PO4 3.4 11/03/2020 11:07 AM    ALBUMIN 3.9 01/25/2022 03:00 PM    TOTPROT 6.5 01/25/2022 03:00 PM    ALKPHOS 120 (H) 01/25/2022 03:00 PM    AST 26 01/25/2022 03:00 PM    ALT 24 01/25/2022 03:00 PM    TOTBILI 0.3 01/25/2022 03:00 PM    EGFR1 54 (L) 01/25/2022 03:00 PM        No results found for: MCALB24   Microalbumin/CR ratio Urine   Date Value Ref Range Status   08/27/2021 73.45 (H) <30 ug/mg Final     Comment:     NOTE NEW REFERENCE RANGES     Microalbumin, Random   Date Value Ref Range Status   08/27/2021 21.3 MCG/ML Final     Lab Results   Component Value Date    CHOL 132 08/27/2021    TRIG 104 08/27/2021    HDL 43 08/27/2021    LDL 73 08/27/2021    VLDL 21 08/27/2021    NONHDLCHOL 89 08/27/2021      TSH   Date Value Ref Range Status   08/27/2021 2.39 0.35 - 5.00 MCU/ML Final       Hemoglobin A1C   Date Value Ref Range Status   10/14/2019 6.8 (H) 4.0 - 6.0 % Final     Comment:     The ADA recommends that most patients with type 1 and type 2 diabetes maintain   an A1c level <7%.     11/16/2018 9.1 (H) 4.0 - 6.0 % Final     Comment:     The ADA recommends that most patients  with type 1 and type 2 diabetes maintain   an A1c level <7%.     10/24/2015 9.3 (H) 4.0 - 6.0 % Final     Comment:     The ADA recommends that most patients with type 1 and type 2 diabetes maintain   an A1c level <7%.       Poc Hemoglobin A1C   Date Value Ref Range Status   02/13/2022 6.2 (A) 4 - 6 % Final   08/23/2021 6.6 (H) 4 - 6 % Final   12/27/2020 6.2 (A) 4 - 6 % Final     Lab Results   Component Value Date    PLTCT 279 01/25/2022        Assessment and Plan:  Diabetes mellitus type 2,  controlled  A1C 6.2%, GMI on CGM 7.4%  ? Target A1c <7% without significant hypoglycemia  ? Complicated by: Nephropathy, poorly healing wounds, pancreatitis   ? On Levemir 40-45 units daily, Farxiga 5 mg daily, Novolog 27-30 units with meals   ?  Plan:  ? Switch to Toujeo 46 units daily for longer duration of action   ? Continue current novolog dosing   ? Continue Farxiga 5 mg daily  ? She did have a yeast infection on farxiga in the past-- alert me to further yeast infections.   ? Will continue to avoid GLP1 due to hx of pancreatitis.   ? Discussed rule of 15 and how to tx hypoglycemia  ? Reminded pt to rotate insulin sites  ? Discussed coping strategies for stress in depth today. She plans to restart exercise and stress management.     Diabetic complications - prevention and management:  - Annual labs (electrolytes and renal function): Last done 10-/2023  - Annual eye exam: Last done  April 2023. Retinopathy - none  - Hyperlipidemia: Lipid panel: Last done 08/2021- improved         - Statin: yes         - Discussed dietary modifications (low cholesterol)  - Nephropathy: yes, CKD 3. Annual Urine microalbumin/Cr: 08/2021, slightly elevated.   - Foot exam / monofilament exam (recommended annually): Last done today         - Discussed foot care.   - Diabetic Educator and/or nutritionist visit (recommended annually): following with Pattie Leuyot RD [facility]     Obesity, class 1:   Discussed patient's BMI with her.  The body mass index is 34.86 kg/m?Marland Kitchen and falls within the category of Obesity 2 (35 to <40); BMI plan is in progress and plans to follow up with dietitian. Encouraged her on weight loss so far.    Hypertension  ? Increase losartan to 100 mg daily   ? Will reach out to PCP to update. She plans to set up follow up visit with PCP.     L sided abdominal pain   ? Reports this feels similar to the start of her pancreatitis in the past   ? Lipase today     RTC in 3 months     Patient Instructions   Our plan:   - Switch to Toujeo 46 units daily (instead of the Levemir)    Tina Coltrin Antonieta Loveless, PA-C                             Total Time Today was 45 minutes in the following activities: Preparing to see the patient, Obtaining and/or reviewing separately obtained history,  Performing a medically appropriate examination and/or evaluation, Counseling and educating the patient/family/caregiver, Ordering medications, tests, or procedures, Referring and communication with other health care professionals (when not separately reported), Documenting clinical information in the electronic or other health record, Independently interpreting results (not separately reported) and communicating results to the patient/family/caregiver and Care coordination (not separately reported)

## 2022-02-14 DIAGNOSIS — Z794 Long term (current) use of insulin: Secondary | ICD-10-CM

## 2022-02-14 DIAGNOSIS — E1165 Type 2 diabetes mellitus with hyperglycemia: Secondary | ICD-10-CM

## 2022-02-15 ENCOUNTER — Encounter: Admit: 2022-02-15 | Discharge: 2022-02-15 | Payer: BC Managed Care – PPO

## 2022-02-15 MED ORDER — FLUCONAZOLE 150 MG PO TAB
150 mg | ORAL_TABLET | Freq: Once | ORAL | 0 refills | 3.00000 days | Status: AC
Start: 2022-02-15 — End: ?

## 2022-02-15 NOTE — Telephone Encounter
See mychart message. Verbal approval received by Dr. Patsey Berthold. Clayton Lefort, RN

## 2022-02-23 ENCOUNTER — Encounter: Admit: 2022-02-23 | Discharge: 2022-02-23 | Payer: BC Managed Care – PPO

## 2022-02-24 ENCOUNTER — Encounter: Admit: 2022-02-24 | Discharge: 2022-02-24 | Payer: BC Managed Care – PPO

## 2022-02-24 MED ORDER — CYCLOBENZAPRINE 10 MG PO TAB
10 mg | ORAL_TABLET | Freq: Every evening | ORAL | 0 refills
Start: 2022-02-24 — End: ?

## 2022-02-24 MED FILL — DEXCOM G7 SENSOR MISC DEVI: 30 days supply | Qty: 3 | Fill #7 | Status: AC

## 2022-02-24 NOTE — Telephone Encounter
Some refill protocol elements NOT Met  Medication name: Flexeril  Medication Strength: 34m tablet      Off protocol   This refill cannot be delegated    Routed to Provider     LTruitt Leep RN

## 2022-03-05 ENCOUNTER — Encounter: Admit: 2022-03-05 | Discharge: 2022-03-05 | Payer: BC Managed Care – PPO

## 2022-03-05 ENCOUNTER — Ambulatory Visit: Admit: 2022-03-05 | Discharge: 2022-03-05 | Payer: BC Managed Care – PPO

## 2022-03-05 DIAGNOSIS — E538 Deficiency of other specified B group vitamins: Secondary | ICD-10-CM

## 2022-03-05 DIAGNOSIS — K5080 Crohn's disease of both small and large intestine without complications: Secondary | ICD-10-CM

## 2022-03-05 DIAGNOSIS — R1032 Left lower quadrant pain: Secondary | ICD-10-CM

## 2022-03-05 DIAGNOSIS — J984 Other disorders of lung: Secondary | ICD-10-CM

## 2022-03-05 DIAGNOSIS — K859 Acute pancreatitis without necrosis or infection, unspecified: Secondary | ICD-10-CM

## 2022-03-05 DIAGNOSIS — K861 Other chronic pancreatitis: Secondary | ICD-10-CM

## 2022-03-05 DIAGNOSIS — G40909 Epilepsy, unspecified, not intractable, without status epilepticus: Secondary | ICD-10-CM

## 2022-03-05 DIAGNOSIS — H9190 Unspecified hearing loss, unspecified ear: Secondary | ICD-10-CM

## 2022-03-05 DIAGNOSIS — K50813 Crohn's disease of both small and large intestine with fistula: Secondary | ICD-10-CM

## 2022-03-05 DIAGNOSIS — M797 Fibromyalgia: Secondary | ICD-10-CM

## 2022-03-05 DIAGNOSIS — Z789 Other specified health status: Secondary | ICD-10-CM

## 2022-03-05 DIAGNOSIS — I82409 Acute embolism and thrombosis of unspecified deep veins of unspecified lower extremity: Secondary | ICD-10-CM

## 2022-03-05 DIAGNOSIS — K7581 Nonalcoholic steatohepatitis (NASH): Secondary | ICD-10-CM

## 2022-03-05 DIAGNOSIS — R42 Dizziness and giddiness: Secondary | ICD-10-CM

## 2022-03-05 DIAGNOSIS — K56609 Unspecified intestinal obstruction, unspecified as to partial versus complete obstruction: Secondary | ICD-10-CM

## 2022-03-05 DIAGNOSIS — J302 Other seasonal allergic rhinitis: Secondary | ICD-10-CM

## 2022-03-05 DIAGNOSIS — I8290 Acute embolism and thrombosis of unspecified vein: Secondary | ICD-10-CM

## 2022-03-05 DIAGNOSIS — I10 Essential (primary) hypertension: Secondary | ICD-10-CM

## 2022-03-05 DIAGNOSIS — IMO0002 Ulcer: Secondary | ICD-10-CM

## 2022-03-05 DIAGNOSIS — K639 Disease of intestine, unspecified: Secondary | ICD-10-CM

## 2022-03-05 DIAGNOSIS — K5090 Crohn's disease, unspecified, without complications: Secondary | ICD-10-CM

## 2022-03-05 DIAGNOSIS — E119 Type 2 diabetes mellitus without complications: Secondary | ICD-10-CM

## 2022-03-05 DIAGNOSIS — K219 Gastro-esophageal reflux disease without esophagitis: Secondary | ICD-10-CM

## 2022-03-05 DIAGNOSIS — D699 Hemorrhagic condition, unspecified: Secondary | ICD-10-CM

## 2022-03-05 DIAGNOSIS — E785 Hyperlipidemia, unspecified: Secondary | ICD-10-CM

## 2022-03-05 DIAGNOSIS — E139 Other specified diabetes mellitus without complications: Secondary | ICD-10-CM

## 2022-03-05 DIAGNOSIS — D4959 Neoplasm of unspecified behavior of other genitourinary organ: Secondary | ICD-10-CM

## 2022-03-05 DIAGNOSIS — N183 CKD (chronic kidney disease) stage 3, GFR 30-59 ml/min (HCC): Secondary | ICD-10-CM

## 2022-03-05 DIAGNOSIS — Z8619 Personal history of other infectious and parasitic diseases: Secondary | ICD-10-CM

## 2022-03-05 DIAGNOSIS — G43909 Migraine, unspecified, not intractable, without status migrainosus: Secondary | ICD-10-CM

## 2022-03-05 LAB — CBC AND DIFF
ABSOLUTE BASO COUNT: 0 K/UL (ref 0–0.20)
ABSOLUTE EOS COUNT: 0 K/UL (ref 0–0.45)
ABSOLUTE LYMPH COUNT: 1.1 K/UL (ref 1.0–4.8)
ABSOLUTE MONO COUNT: 0.4 K/UL (ref 0–0.80)
ABSOLUTE NEUTROPHIL: 7.4 K/UL — ABNORMAL HIGH (ref 1.8–7.0)
BASOPHILS %: 1 % (ref 0–2)
EOSINOPHILS %: 1 % — ABNORMAL LOW (ref >60)
HEMATOCRIT: 38 % — ABNORMAL HIGH (ref 36–45)
LYMPHOCYTES %: 13 % — ABNORMAL LOW (ref 24–44)
MCH: 27 pg (ref 26–34)
MCHC: 32 g/dL (ref 32.0–36.0)
MONOCYTES %: 5 % (ref 4–12)
MPV: 8.1 FL (ref 7–11)
NEUTROPHILS %: 80 % — ABNORMAL HIGH (ref 41–77)
PLATELET COUNT: 256 K/UL — ABNORMAL HIGH (ref 150–400)
RBC COUNT: 4.5 M/UL (ref 4.0–5.0)
RDW: 16 % — ABNORMAL HIGH (ref 11–15)
WBC COUNT: 9.2 K/UL (ref 4.5–11.0)

## 2022-03-05 LAB — VITAMIN B12: VITAMIN B12: 239 pg/mL (ref 180–914)

## 2022-03-05 LAB — COMPREHENSIVE METABOLIC PANEL
POTASSIUM: 4.2 MMOL/L (ref 3.5–5.1)
SODIUM: 140 MMOL/L (ref 137–147)

## 2022-03-05 LAB — C REACTIVE PROTEIN (CRP): C-REACTIVE PROTEIN: 0.2 mg/dL (ref <1.0)

## 2022-03-05 LAB — SED RATE: ESR: 7 mm/h (ref 0–30)

## 2022-03-05 MED ORDER — MAG-AL 200-200 MG/5 ML PO SUSP
30 mL | ORAL | 1 refills | Status: AC | PRN
Start: 2022-03-05 — End: ?

## 2022-03-05 MED ORDER — SODIUM,POTASSIUM,MAG SULFATES 17.5-3.13-1.6 GRAM PO SOLR
0 refills | 30.00000 days | Status: AC
Start: 2022-03-05 — End: ?

## 2022-03-06 ENCOUNTER — Encounter: Admit: 2022-03-06 | Discharge: 2022-03-06 | Payer: BC Managed Care – PPO

## 2022-03-06 DIAGNOSIS — E538 Deficiency of other specified B group vitamins: Secondary | ICD-10-CM

## 2022-03-06 MED ORDER — CYANOCOBALAMIN (VITAMIN B-12) 1,000 MCG/ML IJ SOLN
1 mL | SUBCUTANEOUS | 6 refills | 29.00000 days | Status: AC
Start: 2022-03-06 — End: ?

## 2022-03-06 NOTE — Progress Notes
Darnelle Maffucci Buckles, MD   03/06/2022 7:20 AM CST Back to Top      B12 is low normal  I recommend 1,000 mcg SQ monthly  Do we need to take over prescribing this?         03/06/2022 9:47 AM Patient sent MyChart message requesting that Dr. York Cerise take over ordering B12 injections

## 2022-03-08 ENCOUNTER — Encounter: Admit: 2022-03-08 | Discharge: 2022-03-08 | Payer: BC Managed Care – PPO

## 2022-03-08 DIAGNOSIS — K21 Gastroesophageal reflux disease with esophagitis without hemorrhage: Secondary | ICD-10-CM

## 2022-03-08 MED ORDER — DEXLANSOPRAZOLE 60 MG PO CPDB
60 mg | ORAL_CAPSULE | Freq: Every day | ORAL | 3 refills | 30.00000 days | Status: AC
Start: 2022-03-08 — End: ?

## 2022-03-08 NOTE — Telephone Encounter
PA was received through [employer] and completed through [email or web address]  Key Code: [id number]  Medication: Dexilant 23m #90 per 90 days   PA can take 24-72 hours for determination. If you have any question contact the number on the back of insurance card.

## 2022-03-08 NOTE — Telephone Encounter
Refill request received for:          Last OV: 03/05/22    Routing to Dr. York Cerise for approval/refusal

## 2022-03-18 ENCOUNTER — Encounter: Admit: 2022-03-18 | Discharge: 2022-03-18 | Payer: BC Managed Care – PPO

## 2022-03-18 ENCOUNTER — Ambulatory Visit: Admit: 2022-03-18 | Discharge: 2022-03-18 | Payer: BC Managed Care – PPO

## 2022-03-18 DIAGNOSIS — J302 Other seasonal allergic rhinitis: Secondary | ICD-10-CM

## 2022-03-18 DIAGNOSIS — H9190 Unspecified hearing loss, unspecified ear: Secondary | ICD-10-CM

## 2022-03-18 DIAGNOSIS — J984 Other disorders of lung: Secondary | ICD-10-CM

## 2022-03-18 DIAGNOSIS — R42 Dizziness and giddiness: Secondary | ICD-10-CM

## 2022-03-18 DIAGNOSIS — R110 Nausea: Secondary | ICD-10-CM

## 2022-03-18 DIAGNOSIS — M2550 Pain in unspecified joint: Secondary | ICD-10-CM

## 2022-03-18 DIAGNOSIS — D699 Hemorrhagic condition, unspecified: Secondary | ICD-10-CM

## 2022-03-18 DIAGNOSIS — K7581 Nonalcoholic steatohepatitis (NASH): Secondary | ICD-10-CM

## 2022-03-18 DIAGNOSIS — I6350 Cerebral infarction due to unspecified occlusion or stenosis of unspecified cerebral artery: Secondary | ICD-10-CM

## 2022-03-18 DIAGNOSIS — E139 Other specified diabetes mellitus without complications: Secondary | ICD-10-CM

## 2022-03-18 DIAGNOSIS — I82409 Acute embolism and thrombosis of unspecified deep veins of unspecified lower extremity: Secondary | ICD-10-CM

## 2022-03-18 DIAGNOSIS — K56609 Unspecified intestinal obstruction, unspecified as to partial versus complete obstruction: Secondary | ICD-10-CM

## 2022-03-18 DIAGNOSIS — G8928 Other chronic postprocedural pain: Secondary | ICD-10-CM

## 2022-03-18 DIAGNOSIS — I10 Essential (primary) hypertension: Secondary | ICD-10-CM

## 2022-03-18 DIAGNOSIS — G43909 Migraine, unspecified, not intractable, without status migrainosus: Secondary | ICD-10-CM

## 2022-03-18 DIAGNOSIS — Z8619 Personal history of other infectious and parasitic diseases: Secondary | ICD-10-CM

## 2022-03-18 DIAGNOSIS — I2699 Other pulmonary embolism without acute cor pulmonale: Secondary | ICD-10-CM

## 2022-03-18 DIAGNOSIS — K5732 Diverticulitis of large intestine without perforation or abscess without bleeding: Secondary | ICD-10-CM

## 2022-03-18 DIAGNOSIS — K859 Acute pancreatitis without necrosis or infection, unspecified: Secondary | ICD-10-CM

## 2022-03-18 DIAGNOSIS — N183 CKD (chronic kidney disease) stage 3, GFR 30-59 ml/min (HCC): Secondary | ICD-10-CM

## 2022-03-18 DIAGNOSIS — M797 Fibromyalgia: Secondary | ICD-10-CM

## 2022-03-18 DIAGNOSIS — K639 Disease of intestine, unspecified: Secondary | ICD-10-CM

## 2022-03-18 DIAGNOSIS — K219 Gastro-esophageal reflux disease without esophagitis: Secondary | ICD-10-CM

## 2022-03-18 DIAGNOSIS — I8290 Acute embolism and thrombosis of unspecified vein: Secondary | ICD-10-CM

## 2022-03-18 DIAGNOSIS — D4959 Neoplasm of unspecified behavior of other genitourinary organ: Secondary | ICD-10-CM

## 2022-03-18 DIAGNOSIS — IMO0002 Ulcer: Secondary | ICD-10-CM

## 2022-03-18 DIAGNOSIS — F419 Anxiety disorder, unspecified: Secondary | ICD-10-CM

## 2022-03-18 DIAGNOSIS — R109 Unspecified abdominal pain: Secondary | ICD-10-CM

## 2022-03-18 DIAGNOSIS — K5900 Constipation, unspecified: Secondary | ICD-10-CM

## 2022-03-18 DIAGNOSIS — Z789 Other specified health status: Secondary | ICD-10-CM

## 2022-03-18 DIAGNOSIS — E785 Hyperlipidemia, unspecified: Secondary | ICD-10-CM

## 2022-03-18 DIAGNOSIS — R011 Cardiac murmur, unspecified: Secondary | ICD-10-CM

## 2022-03-18 DIAGNOSIS — F32A Depression: Secondary | ICD-10-CM

## 2022-03-18 DIAGNOSIS — K5090 Crohn's disease, unspecified, without complications: Secondary | ICD-10-CM

## 2022-03-18 DIAGNOSIS — K50918 Crohn's disease, unspecified, with other complication: Secondary | ICD-10-CM

## 2022-03-18 DIAGNOSIS — K589 Irritable bowel syndrome without diarrhea: Secondary | ICD-10-CM

## 2022-03-18 DIAGNOSIS — G40909 Epilepsy, unspecified, not intractable, without status epilepticus: Secondary | ICD-10-CM

## 2022-03-18 DIAGNOSIS — E119 Type 2 diabetes mellitus without complications: Secondary | ICD-10-CM

## 2022-03-18 NOTE — Progress Notes
[facility] HISTORY [facility] PHYSICAL    Dear Dr. Vinnie Level Buckles,    I appreciate your referral of Tina Snyder for evaluation of pain. Please see my note below for the full details of the evaluation and management plan.    Thank you for your time,  Chelsea Primus, MD         HISTORY OF PRESENT ILLNESS:      Referring physician: Vinnie Level Buckles    Chief complaint: New Patient ( crohons)      Tina Snyder is a 56 y.o. female who  has a past medical history of Anxiety, Bleeding tendency (HCC), Blood clot in vein, Bowel disease, Cerebral artery occlusion with cerebral infarction (HCC), CKD (chronic kidney disease) stage 3, GFR 30-59 ml/min (HCC), Constipation, Crohn disease (HCC), Deep vein thrombosis (DVT) (HCC), Depression, Difficult intravenous access, Diverticulitis of colon (without mention of hemorrhage)(562.11), Dizziness, DM (diabetes mellitus), secondary-steroid-induced (2012), Fibromyalgia, GERD (gastroesophageal reflux disease), H/O Mycobacterium avium intracellulare infection, Hearing loss, Heart murmur, Hyperlipidemia, Hypertension, Hypertensive heart disease, Irritable bowel disease, Joint pain, Lung disease, Migraines, Nausea, Pancreatitis, Pulmonary embolism (HCC), Seasonal allergic reaction, Seizure disorder (HCC), Small bowel obstruction (HCC), Steatohepatitis, Type 2 diabetes mellitus without complication, with long-term current use of insulin (HCC) (10/22/2017), Ulcer, and Vaginal tumor.  Patient-entered data is noted with quotes.    This is a very pleasant 56 year old female who presents with chronic right-sided lower abdominal pain and a recent onset of left lower quadrant abdominal pain  She describes the pain as tightness and aching, and notes that she has had multiple abdominal surgeries in the past  She has had 2 C-sections in the past and has had a bowel resection as well in relation to Crohn's disease  Her last surgery was in 2014  She notes that she does feel constant tightness in the abdominal area and feels some aching that is constant in the left lower quadrant of the abdomen  She has tried some massage and chiropractor work  She does follow with GI, Dr. Janene Madeira  They are currently in the process of workup for the left lower quadrant abdominal pain  They do have scopes planned, and an MRI planned in January  We did discuss the potential of her having nerve impingement from previous surgeries and scar tissue buildup in the abdomen as well that could be contributing to her pain  She does note that when she tenses the abdomen, she does feel pain in the lower quadrant area, persistently on the right side but more acutely on the left side  She does have some blood in the stool that she noted and GI is aware of this as well  She denies new injury to the area  She denies dysfunction that is new in onset  Pain is more significant on the left side rather than the right side        PRIOR MEDICATIONS (C = current medication):   Effective  Cyclobenzaprine  duloxetine    Ineffective  NSAIDs  Acetaminophen    Unable to tolerate      Never/Unknown  Gabapentin   Pregabalin  Ami/Nortriptyline  Duloxetine  Tizanidine  Methocarbamol  Baclofen  Cyclobenzaprine      PRIOR INTERVENTIONS:   Effective      Ineffective      Pain diagram:               Medical History:   Diagnosis Date   ? Anxiety    ? Bleeding  tendency (HCC)    ? 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 Bowel disease    ? Cerebral artery occlusion with cerebral infarction [facility])    ? CKD (chronic kidney disease) stage 3, GFR 30-59 ml/min (HCC)    ? Constipation    ? Crohn disease (HCC)    ? Deep vein thrombosis (DVT) (HCC)    ? Depression    ? Difficult intravenous access    ? Diverticulitis of colon (without mention of hemorrhage)(562.11)    ? Dizziness    ? DM (diabetes mellitus), secondary-steroid-induced 2012   ? Fibromyalgia    ? GERD (gastroesophageal reflux disease)    ? H/O Mycobacterium avium intracellulare infection    ? Hearing loss    ? Heart murmur    ? Hyperlipidemia    ? Hypertension    ? Hypertensive heart disease    ? Irritable bowel disease    ? Joint pain    ? Lung disease    ? Migraines    ? Nausea    ? Pancreatitis    ? Pulmonary embolism (HCC)    ? Seasonal allergic reaction    ? Seizure disorder (HCC)    ? Small bowel obstruction (HCC)    ? Steatohepatitis     felt secondary to medications   ? Type 2 diabetes mellitus without complication, with long-term current use of insulin (HCC) 10/22/2017   ? Ulcer    ? Vaginal tumor          Surgical History:   Procedure Laterality Date   ? HX ADENOIDECTOMY  05/24/1995   ? LUNG SURGERY  10/22/2010    lung biopsy   ? LIVER BIOPSY  12/22/2010   ? ESOPHAGOGASTRODUODENOSCOPY N/A 03/13/2015    Performed by Elyn Aquas, MD at [facility] ENDO   ? COLONOSCOPY N/A 03/13/2015    Performed by Elyn Aquas, MD at [facility] ENDO   ? ESOPHAGOGASTRODUODENOSCOPY BIOPSY  03/13/2015    Performed by Elyn Aquas, MD at [facility] END[facility]   ? COLONOSCOPY BIOPSY  03/13/2015    Performed by Elyn Aquas, MD at [facility] ENDO   ? ESOPHAGOGASTRODUODENOSCOPY WITH SPECIMEN COLLECTION BY BRUSHING/ WASHING N/A 06/30/2017    Performed by Jolee Ewing, MD at [facility] ENDO   ? COLONOSCOPY DIAGNOSTIC WITH SPECIMEN COLLECTION BY BRUSHING/ WASHING - FLEXIBLE N/A 06/30/2017    Performed by Jolee Ewing, MD at [facility] ENDO   ? ESOPHAGOGASTRODUODENOSCOPY WITH BIOPSY - FLEXIBLE N/A 06/30/2017    Performed by Jolee Ewing, MD at [facility] ENDO   ? COLONOSCOPY WITH BIOPSY - FLEXIBLE  06/30/2017    Performed by Jolee Ewing, MD at [facility] ENDO   ? COLONOSCOPY DIAGNOSTIC WITH SPECIMEN COLLECTION BY BRUSHING/ WASHING - FLEXIBLE N/A 11/11/2018    Performed by Tommie Sams, MD at [facility] ENDO   ? ESOPHAGOGASTRODUODENOSCOPY WITH SPECIMEN COLLECTION BY BRUSHING/ WASHING N/A 11/11/2018    Performed by Tommie Sams, MD at [facility] ENDO   ? ESOPHAGOGASTRODUODENOSCOPY WITH BIOPSY - FLEXIBLE  11/11/2018    Performed by Tommie Sams, MD at [facility] ENDO   ? COLONOSCOPY DIAGNOSTIC WITH SPECIMEN COLLECTION BY BRUSHING/ WASHING - FLEXIBLE N/A 11/12/2018    Performed by Veneta Penton, MD at [facility] ENDO   ? COLONOSCOPY WITH BIOPSY - FLEXIBLE  11/12/2018    Performed by Veneta Penton, MD at [facility] ENDO   ? ESOPHAGOGASTRODUODENOSCOPY WITH ENDOSCOPIC ULTRASOUND EXAMINATION - FLEXIBLE N/A 03/01/2020    Performed by Comer Locket, MD at  [facility] ENDO   ? APPENDECTOMY     ? BRONCHOSCOPY     ? CHOLECYSTECTOMY     ? GASTRIC FUNDOPLICATION     ? HEMORRHOIDECTOMY     ? HX BREAST LUMPECTOMY     ? HX EAR TUBES     ? HX ENDOSCOPY      colonoscopy 2014   ? HX HYSTERECTOMY      partial 02/1995, ovaries removed 1999   ? OOPHORECTOMY      1999 ovaries removed   ? SMALL BOWEL RESECTION     ? SUBTOTAL COLECTOMY      Ileocolectomy          Allergies   Allergen Reactions   ? Bee Venom Protein (Honey Bee) ANAPHYLAXIS   ? Morphine HALLUCINATIONS     States she quits breathing     ? Bee [Bumble Bee] UNKNOWN   ? Ciprofloxacin NAUSEA AND VOMITING   ? Compazine [Prochlorperazine] HIVES and SEE COMMENTS     Tolerates Phenergan   ? Flagyl [Metronidazole] HIVES   ? Reglan [Metoclopramide] SEE COMMENTS     Patient states that she cannot tolerate.   ? Stadol [Butorphanol Tartrate] SEE COMMENTS     Patient states that it made her loopy and drunk.   ? Sulfa (Sulfonamide Antibiotics) HIVES and NAUSEA AND VOMITING   ? Tysabri [Natalizumab] SEE COMMENTS     JC virus positive.  Do not use Tysabri - high risk for PML   ? Walnut UNKNOWN   ? Zyrtec [Cetirizine] UNKNOWN   ? Coumadin [Warfarin] SEE COMMENTS     Hemorrhage   ? Lovenox [Enoxaparin] SEE COMMENTS     Hemorrhage   ? Pecan Nut UNKNOWN         Current Outpatient Medications on File Prior to Visit   Medication Sig Dispense Refill   ? aluminum-magnesium hydroxide (MAG-AL) 200-200 mg/5 mL oral suspension Take 30 mL by mouth every 8 hours as needed. 60 mL 1   ? atorvastatin (LIPITOR) 40 mg tablet TAKE 1 TABLET BY MOUTH DAILY 90 tablet 1   ? Blood-Glucose Sensor (DEXCOM G6 SENSOR) sensor device Use one each as directed as directed. Change every 10 days 1 each 0   ? budesonide (ENTOCORT EC) 3 mg capsule TAKE 2 CAPSULES BY MOUTH EVERY DAY 180 capsule 1   ? buPROPion XL (WELLBUTRIN XL) 300 mg tablet Take one tablet by mouth every morning. Do not crush or chew. 90 tablet 1   ? cyanocobalamin (vitamin B-12) (RUBRAMIN PC) 1,000 mcg/mL injection solution Inject 1 mL under the skin every 30 days. 1 mL 6   ? cyclobenzaprine (FLEXERIL) 10 mg tablet TAKE ONE TABLET BY MOUTH AT BEDTIME DAILY. 90 tablet 0   ? dapagliflozin (FARXIGA) 5 mg tablet Take one tablet by mouth daily. 90 tablet 3   ? DEXCOM G7 SENSOR sensor device Use one each as directed before meals and at bedtime. Indications: type 2 diabetes mellitus 9 each 3   ? dexlansoprazole (DEXILANT) 60 mg capsule Take one capsule by mouth daily. Indications: gastroesophageal reflux disease 90 capsule 3   ? diclofenac sodium (VOLTAREN) 1 % topical gel Apply four g topically to affected area four times daily. 300 g 3   ? duloxetine DR (CYMBALTA) 60 mg capsule Take one capsule by mouth daily. 90 capsule 3   ? EPINEPHrine (AUVI-Q) 1 mg/mL injection pen (2-Pack) Inject 0.3 mg (1 Pen) into thigh if needed for anaphylactic reaction. May repeat in 5-15 minutes  if needed. 2 each 1   ? famotidine (PEPCID) 20 mg tablet TAKE ONE TABLET BY MOUTH AT BEDTIME DAILY. 90 tablet 1   ? glucagon (BAQSIMI) 3 mg/actuation nasal spray Use 1 spray in single nostril. If no response, may repeat in 15 minutes using second device. 2 each 0   ? insulin aspart (U-100) (NOVOLOG FLEXPEN U-100 INSULIN) 100 unit/mL (3 mL) PEN Inject forty Units under the skin three times daily with meals. Indications: e11.65 (Patient taking differently: Inject ten Units to twenty seven Units under the skin three times daily with meals. Indications: e11.65) 120 mL 3   ? insulin detemir U-100 (LEVEMIR FLEXPEN) 100 unit/mL (3 mL) injection pen Inject thirty four Units under the skin daily. (Patient taking differently: Inject forty Units to forty five Units under the skin daily.) 30 mL 2   ? insulin glargine U-300 conc (TOUJEO SOLOSTAR U-300 INSULIN) 300 unit/mL (1.5 mL) injectable PEN Inject forty six Units under the skin daily. 27 mL 3   ? losartan (COZAAR) 100 mg tablet Take one tablet by mouth daily. 90 tablet 3   ? nebivolol (BYSTOLIC) 10 mg tablet Take one tablet by mouth daily. 30 tablet 0   ? ondansetron HCL (ZOFRAN) 4 mg tablet Take one tablet by mouth every 8 hours. (Patient taking differently: Take one tablet by mouth every 8 hours as needed.) 30 tablet 0   ? pantoprazole DR (PROTONIX) 40 mg tablet Take one tablet by mouth daily. 90 tablet    ? pen needle, diabetic (BD NANO 2ND GEN PEN NEEDLE) 32 gauge x 5/32 pen needle Use one each as directed before meals and at bedtime. Use with insulin injections. 300 each 3   ? polyethylene glycol 3350 (MIRALAX) 17 gram/dose powder Take seventeen g by mouth twice daily. 1054 g 1   ? promethazine (PHENERGAN) 25 mg tablet Take one tablet by mouth every 6 hours as needed for Nausea or Vomiting. 60 tablet 3   ? sodium,potassium,mag sulfates (SUPREP BOWEL PREP KIT) 17.5-3.13-1.6 gram oral solution Evening prior: mix 1 bottle with 16 ounces of water and drink orally, followed by 32 additional ounces of water.  Day of: mix 2nd bottle and drink orally, followed by 32 ounces of water. Complete the bowel preparation at least 2 hrs before procedure. 354 mL 0   ? traMADoL (ULTRAM) 50 mg tablet Take one tablet by mouth three times daily as needed for Pain. 30 tablet 0   ? triamcinolone acetonide (TRIDERM) 0.1 % topical cream Apply  topically to affected area twice daily. 80 g 3   ? vedolizumab (ENTYVIO IV) Administer  through vein every 8 weeks.       No current facility-administered medications on file prior to visit.         family history includes Alcohol abuse in her father; Asthma in her son; Basal Cell Carcinoma in her mother; Cancer in her paternal grandmother; Celiac Disease in her son; Crohn's Disease in her mother; Dementia in her mother; Depression in her father, mother, and paternal grandmother; Diabetes in her mother; Heart Failure in her maternal grandfather and paternal grandfather; High Cholesterol in her father and paternal grandmother; Hypertension in her father, maternal grandmother, mother, paternal grandfather, and paternal grandmother; Kidney Failure in her father; Liver Disease in her father; Migraines in her maternal grandfather; Stroke in her maternal grandfather and maternal grandmother.      Social History     Socioeconomic History   ? Marital status: Married  Spouse name: Susy Frizzle   ? Number of children: 2   Occupational History   ? 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 Smoking status: Never   ? Smokeless tobacco: Never   Vaping Use   ? Vaping Use: Never used   Substance and Sexual Activity   ? Alcohol use: No   ? Drug use: No   ? 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    ?     CBD   ? Product Type Oil    ? Route Oral    ? Current Frequency Weekly    ? Indication Mood    ? Max Frequency Weekly             Review of Systems   Gastrointestinal: Positive for abdominal pain.         PHYSICAL EXAM:    Vitals:    03/18/22 1357   BP: (!) 159/102   BP Source: Arm, Left Upper   Pulse: 89   SpO2: 98%   PainSc: Seven   Weight: 99.8 kg (220 lb)   Height: 162.6 cm (5' 4)     Oswestry Total Score:: 32  Pain Score: Seven  Body mass index is 37.76 kg/m?Marland Kitchen    General: The patient is a well-developed, well nourished 56 y.o. female in no acute distress.   HEENT: Head is normocephalic and atraumatic. Pupils are equal and reactive to light bilaterally. Cardiac: Based on palpation, pulse appears to be regular rate and rhythm.   Pulmonary: The patient has unlabored respirations and bilateral symmetric chest excursion.   Abdomen: Soft, tender RLQ/LLQ, and distended. There is no rebound or guarding.  Carnett's sign +   Extremities: No clubbing, cyanosis, or edema.     Neurologic:   The patient is alert and oriented times 3.   Cranial nerves II through XII are intact without any focal deficits.     Musculoskeletal:   Gait is normal.    Neurological: Alert and oriented x3.        RADIOGRAPHIC EVALUATION:    MR pending       IMPRESSION:    1. Chronic abdominal pain    2. Crohn's disease with other complication, unspecified gastrointestinal tract location (HCC)    3. Chronic post-operative pain        Patient has had an adequate trial of > 6 month of rest, exercise, multimodal treatment, and the passage of time without improvement of symptoms. The pain has significant impact on the daily quality of life.     PLAN:   1. Interventions: After discussion with the patient, we will plan on scheduling a bilateral tap block in clinic in 3 months after GI evaluation and scopes are complete  2. Medications: Continue current pharmacologic regimen.  Can consider adding a midday dose of cyclobenzaprine, 10 mg for additional muscle relief.  She was counseled on avoiding operation of heavy machinery if she tries this  3. Imaging: No new imaging, MR pending per GI  4. Continue PT/Exercise therapy as tolerated.   5. Follow up  after procedure.     Risks/benefits of all pharmacologic and interventional treatments discussed and questions answered.     Chelsea Primus, MD  Anesthesiology and Interventional Pain      CC: Dr. Reuel Boom C Buckles

## 2022-03-19 ENCOUNTER — Encounter: Admit: 2022-03-19 | Discharge: 2022-03-19 | Payer: BC Managed Care – PPO

## 2022-03-19 DIAGNOSIS — K5010 Crohn's disease of large intestine without complications: Secondary | ICD-10-CM

## 2022-03-19 MED ORDER — TRAMADOL 50 MG PO TAB
50 mg | ORAL_TABLET | Freq: Three times a day (TID) | ORAL | 0 refills | Status: AC | PRN
Start: 2022-03-19 — End: ?

## 2022-03-19 MED FILL — DEXCOM G7 SENSOR MISC DEVI: 30 days supply | Qty: 3 | Fill #8 | Status: AC

## 2022-03-21 ENCOUNTER — Encounter: Admit: 2022-03-21 | Discharge: 2022-03-21 | Payer: BC Managed Care – PPO

## 2022-03-22 ENCOUNTER — Ambulatory Visit: Admit: 2022-03-22 | Discharge: 2022-03-23 | Payer: BC Managed Care – PPO

## 2022-03-22 ENCOUNTER — Encounter: Admit: 2022-03-22 | Discharge: 2022-03-22 | Payer: BC Managed Care – PPO

## 2022-03-22 DIAGNOSIS — K50918 Crohn's disease, unspecified, with other complication: Secondary | ICD-10-CM

## 2022-03-22 MED ORDER — DIPHENHYDRAMINE HCL 50 MG/ML IJ SOLN
25 mg | Freq: Once | INTRAVENOUS | 0 refills
Start: 2022-03-22 — End: ?

## 2022-03-22 MED ORDER — ACETAMINOPHEN 500 MG PO TAB
500 mg | Freq: Once | ORAL | 0 refills
Start: 2022-03-22 — End: ?

## 2022-03-22 MED ORDER — VEDOLIZUMAB IVPB
300 mg | Freq: Once | INTRAVENOUS | 0 refills
Start: 2022-03-22 — End: ?

## 2022-03-22 MED ORDER — DIPHENHYDRAMINE HCL 50 MG/ML IJ SOLN
25 mg | Freq: Once | INTRAVENOUS | 0 refills | Status: CP
Start: 2022-03-22 — End: ?
  Administered 2022-03-22: 21:00:00 25 mg via INTRAVENOUS

## 2022-03-22 MED ORDER — DIPHENHYDRAMINE HCL 50 MG/ML IJ SOLN
25 mg | Freq: Once | INTRAVENOUS | 0 refills | Status: CP | PRN
Start: 2022-03-22 — End: ?
  Administered 2022-03-22: 21:00:00 25 mg via INTRAVENOUS

## 2022-03-22 MED ORDER — ACETAMINOPHEN 500 MG PO TAB
500 mg | Freq: Once | ORAL | 0 refills | Status: CP
Start: 2022-03-22 — End: ?
  Administered 2022-03-22: 20:00:00 500 mg via ORAL

## 2022-03-22 MED ORDER — VEDOLIZUMAB IVPB
300 mg | Freq: Once | INTRAVENOUS | 0 refills | Status: CP
Start: 2022-03-22 — End: ?
  Administered 2022-03-22 (×2): 300 mg via INTRAVENOUS

## 2022-03-22 NOTE — Progress Notes
Patient tolerated Entyvio infusion, administered benadryl halfway through the infusion; no reaction noted.

## 2022-03-26 ENCOUNTER — Encounter: Admit: 2022-03-26 | Discharge: 2022-03-26 | Payer: BC Managed Care – PPO

## 2022-03-31 ENCOUNTER — Encounter: Admit: 2022-03-31 | Discharge: 2022-03-31 | Payer: BC Managed Care – PPO

## 2022-03-31 DIAGNOSIS — K50018 Crohn's disease of small intestine with other complication: Secondary | ICD-10-CM

## 2022-03-31 MED ORDER — BUDESONIDE 3 MG PO CECX
ORAL_CAPSULE | 1 refills
Start: 2022-03-31 — End: ?

## 2022-04-05 ENCOUNTER — Encounter: Admit: 2022-04-05 | Discharge: 2022-04-05 | Payer: BC Managed Care – PPO

## 2022-04-05 MED ORDER — NEBIVOLOL 10 MG PO TAB
10 mg | ORAL_TABLET | Freq: Every day | ORAL | 1 refills | 60.00000 days | Status: AC
Start: 2022-04-05 — End: ?

## 2022-04-05 NOTE — Telephone Encounter
Some refill protocol elements NOT Met  Medication name: Bystolic  Medication Strength: 10 mg    Last Fill Date: Previously filled by Cardiology    Off protocol:      Cr in normal range and within 180 days    eGFR in normal range and within 180 days    Valid encounter within last 12 months    BP completed in the last 12 months    Heart Rate completed in the last 12 months       Routed to Provider

## 2022-04-15 ENCOUNTER — Encounter: Admit: 2022-04-15 | Discharge: 2022-04-15 | Payer: BC Managed Care – PPO

## 2022-04-15 MED FILL — DEXCOM G7 SENSOR MISC DEVI: 30 days supply | Qty: 3 | Fill #9 | Status: AC

## 2022-04-20 ENCOUNTER — Encounter: Admit: 2022-04-20 | Discharge: 2022-04-20 | Payer: BC Managed Care – PPO

## 2022-04-22 ENCOUNTER — Encounter: Admit: 2022-04-22 | Discharge: 2022-04-22 | Payer: BC Managed Care – PPO

## 2022-04-23 ENCOUNTER — Encounter: Admit: 2022-04-23 | Discharge: 2022-04-23 | Payer: BC Managed Care – PPO

## 2022-04-23 DIAGNOSIS — F419 Anxiety disorder, unspecified: Secondary | ICD-10-CM

## 2022-04-23 MED ORDER — DIAZEPAM 5 MG PO TAB
5 mg | ORAL_TABLET | ORAL | 0 refills | 7.00000 days | Status: AC | PRN
Start: 2022-04-23 — End: ?

## 2022-04-27 ENCOUNTER — Ambulatory Visit: Admit: 2022-04-27 | Discharge: 2022-04-27 | Payer: BC Managed Care – PPO

## 2022-04-27 ENCOUNTER — Encounter: Admit: 2022-04-27 | Discharge: 2022-04-27 | Payer: BC Managed Care – PPO

## 2022-04-27 DIAGNOSIS — K50813 Crohn's disease of both small and large intestine with fistula: Secondary | ICD-10-CM

## 2022-04-27 MED ORDER — BREEZA FOR NEUTRAL ABDOMINAL/PELVIC IMAGING PO SOLN
1500 mL | Freq: Once | ORAL | 0 refills | Status: CP
Start: 2022-04-27 — End: ?

## 2022-04-27 MED ORDER — GADOBENATE DIMEGLUMINE 529 MG/ML (0.1MMOL/0.2ML) IV SOLN
20 mL | Freq: Once | INTRAVENOUS | 0 refills | Status: CP
Start: 2022-04-27 — End: ?
  Administered 2022-04-27: 20:00:00 20 mL via INTRAVENOUS

## 2022-04-27 MED ORDER — GLUCAGON HCL 1 MG/ML IJ SOLR
1 [IU] | Freq: Once | INTRAMUSCULAR | 0 refills | Status: CP
Start: 2022-04-27 — End: ?
  Administered 2022-04-27: 19:00:00 1 mg via INTRAMUSCULAR

## 2022-04-29 ENCOUNTER — Encounter: Admit: 2022-04-29 | Discharge: 2022-04-29 | Payer: BC Managed Care – PPO

## 2022-04-29 NOTE — Telephone Encounter
04/29/2022 1:11 PM Spoke with patient regarding MRE results and recommendations as interpreted by Dr. York Cerise. Patient reports that her symptoms have improved slightly. She is having less blood in her stool. She is still having left-sided abdominal pain. Patient denies fevers and vomiting. Patient reports nausea, but indicates that this is her baseline. She is still having heartburn despite taking Dexilant. Patient would like to know if her MRE results have changed the need for her to have EGD/Colonoscopy, which are scheduled for 1/17. Will reach out to Dr. York Cerise for further guidance.

## 2022-04-30 ENCOUNTER — Encounter: Admit: 2022-04-30 | Discharge: 2022-04-30 | Payer: BC Managed Care – PPO

## 2022-04-30 DIAGNOSIS — K21 Gastroesophageal reflux disease with esophagitis without hemorrhage: Secondary | ICD-10-CM

## 2022-04-30 DIAGNOSIS — E1165 Type 2 diabetes mellitus with hyperglycemia: Secondary | ICD-10-CM

## 2022-04-30 MED ORDER — CYCLOBENZAPRINE 10 MG PO TAB
10 mg | ORAL_TABLET | Freq: Every evening | ORAL | 0 refills | 30.00000 days | Status: AC
Start: 2022-04-30 — End: ?

## 2022-04-30 MED ORDER — FAMOTIDINE 20 MG PO TAB
20 mg | ORAL_TABLET | Freq: Every evening | ORAL | 1 refills | 90.00000 days | Status: AC
Start: 2022-04-30 — End: ?

## 2022-04-30 MED ORDER — LANTUS SOLOSTAR U-100 INSULIN 100 UNIT/ML (3 ML) SC INPN
0 refills
Start: 2022-04-30 — End: ?

## 2022-04-30 NOTE — Telephone Encounter
Refill request received for:     famotidine (PEPCID) 20 mg tablet     Sig: TAKE ONE TABLET BY MOUTH AT BEDTIME DAILY.     Last OV: 03/05/22  Last fill: 01/28/22    Medication meets criteria for nurse-driven refill

## 2022-04-30 NOTE — Telephone Encounter
Requested Prescriptions   Pending Prescriptions Disp Refills    cyclobenzaprine (FLEXERIL) 10 mg tablet [Pharmacy Med Name: CYCLOBENZAPRINE 10 MG TABLET] 90 tablet 0     Sig: TAKE ONE TABLET BY MOUTH AT BEDTIME DAILY.       Analgesics: Muscle Relaxants Failed - 04/30/2022  2:03 PM        Failed - Valid encounter within last 6 months     Recent Visits  Date Type Provider Dept   10/11/21 Office Visit Patsey Berthold Levester Fresh, MD [facility]   05/17/21 Office Visit Telehealth Otilio Jefferson, APRN-NP [facility]   11/23/20 Office Visit Telehealth Cecilio Asper, MD [facility]   09/05/20 Office Visit Patsey Berthold, Levester Fresh, MD [facility]   Showing recent visits within past 720 days and meeting all other requirements  Future Appointments  No visits were found meeting these conditions.  Showing future appointments within next 360 days and meeting all other requirements            Failed - This refill cannot be delegated        Passed - Patient is not pregnant        Passed - Patient is < 10 years old. Beers Criteria: Use with caution in patients 65+           Some refill protocol elements NOT Met  Medication name: Cyclobenzaprine  Medication Strength: 10 mg    Last Fill Date: 02/25/2022    Non-delegated medication    Routed to Provider  Zella Ball, MA

## 2022-05-01 ENCOUNTER — Encounter: Admit: 2022-05-01 | Discharge: 2022-05-01 | Payer: BC Managed Care – PPO

## 2022-05-01 DIAGNOSIS — E1165 Type 2 diabetes mellitus with hyperglycemia: Secondary | ICD-10-CM

## 2022-05-01 MED ORDER — TOUJEO SOLOSTAR U-300 INSULIN 300 UNIT/ML (1.5 ML) SC INPN
46 [IU] | Freq: Every day | SUBCUTANEOUS | 3 refills
Start: 2022-05-01 — End: ?

## 2022-05-01 MED ORDER — INSULIN GLARGINE 100 UNIT/ML (3 ML) SC INJ PEN
0 refills
Start: 2022-05-01 — End: ?

## 2022-05-01 NOTE — Telephone Encounter
-----  Message from Dorita Fray, MD sent at 05/01/2022  6:58 AM CST -----  Please arrange follow up in clinic due to recent MR enterography showing right lower lobe pneumonia. You could double book or have her come in at noon on a Wednesday or at 4 pm on a Monday. TY  ----- Message -----  From: Viviann Spare, MD  Sent: 04/29/2022  12:49 PM CST  To: Elenore Paddy, RN; Cordie Grice, MBBS; #    MR enterography does not show small bowel obstruction or inflammation. Will route findings regarding lungs to her pulmonology provider to see if follow up is recommended.

## 2022-05-01 NOTE — Telephone Encounter
Called patient.  She reports that she is scheduled for an upper and lower scope in GI lab next Wednesday 05/08/21 and has a 2 day clean out prior so she would not be able to come in to see Dr.Duthuluru 1/15-1/17.  She is only available this week: 1/11-1/12 or next week: 1/18-1/19.    Routing to Dr.Duthuluru.

## 2022-05-01 NOTE — Telephone Encounter
Pt agreeable to an appointment on 05/09/22 at 4 pm.

## 2022-05-01 NOTE — Telephone Encounter
Message from Pharm:    Pharmacy comment: [facility] [place]. USE LANTUS SOLOSTAR OR CALL [phone].       Are you ok with Lantus instead of Toujeo?

## 2022-05-03 ENCOUNTER — Encounter: Admit: 2022-05-03 | Discharge: 2022-05-03 | Payer: BC Managed Care – PPO

## 2022-05-03 NOTE — Telephone Encounter
PA denied, preferred formulary alternative is [place]:

## 2022-05-03 NOTE — Telephone Encounter
PA initiated in CoverMyMeds:    Kathrene Geraldo (Key: [id number])    Your PA request has been sent to [place]. Waiting 1 minute for a real-time reply.Marland KitchenMarland Kitchen

## 2022-05-03 NOTE — Telephone Encounter
Carlisle Beers, PA-C         05/02/22  1:47 PM  Note      Since she is on a higher dose (> 40 units), I'd prefer a concentrated insulin if possible (either toujeo or tresiba would be OK)          Routing back to [place] for orders for [place].

## 2022-05-08 ENCOUNTER — Encounter: Admit: 2022-05-08 | Discharge: 2022-05-08 | Payer: BC Managed Care – PPO

## 2022-05-08 ENCOUNTER — Ambulatory Visit: Admit: 2022-05-08 | Discharge: 2022-05-08 | Payer: BC Managed Care – PPO

## 2022-05-08 DIAGNOSIS — K219 Gastro-esophageal reflux disease without esophagitis: Secondary | ICD-10-CM

## 2022-05-08 MED ORDER — PROPOFOL INJ 10 MG/ML IV VIAL
INTRAVENOUS | 0 refills | Status: DC
Start: 2022-05-08 — End: 2022-05-08

## 2022-05-08 MED ORDER — LIDOCAINE (PF) 20 MG/ML (2 %) IJ SOLN
INTRAVENOUS | 0 refills | Status: DC
Start: 2022-05-08 — End: 2022-05-08

## 2022-05-08 MED ORDER — FAMOTIDINE 40 MG PO TAB
40 mg | ORAL_TABLET | Freq: Two times a day (BID) | ORAL | 3 refills | 90.00000 days | Status: AC
Start: 2022-05-08 — End: ?

## 2022-05-08 MED ADMIN — LACTATED RINGERS IV SOLP [4318]: 1000.000 mL | INTRAVENOUS | @ 17:00:00 | Stop: 2022-05-08 | NDC 00338011704

## 2022-05-08 NOTE — Anesthesia Post-Procedure Evaluation
Post-Anesthesia Evaluation    Name: Tina Snyder      MRN: [id number]     DOB: 10-21-1965     Age: 57 y.o.     Sex: female   __________________________________________________________________________     Procedure Information       Anesthesia Start Date/Time: 05/08/22 1100    Procedures:       COLONOSCOPY WITH BIOPSY - FLEXIBLE      ESOPHAGOGASTRODUODENOSCOPY WITH BIOPSY - FLEXIBLE    Location: ENDO 5 / ENDO/GI    Surgeons: Buckles, Darnelle Maffucci, MD            Post-Anesthesia Vitals  BP: 140/89 (01/17 1140)  Temp: 37 C (98.6 F) (01/17 1128)  Pulse: 72 (01/17 1140)  Respirations: 20 PER MINUTE (01/17 1140)  SpO2: 94 % (01/17 1140)  O2 Device: Non-rebreather (01/17 1130)   Vitals Value Taken Time   BP 140/89 05/08/22 1140   Temp 37 C (98.6 F) 05/08/22 1128   Pulse 72 05/08/22 1140   Respirations 20 PER MINUTE 05/08/22 1140   SpO2 94 % 05/08/22 1140   O2 Device Non-rebreather 05/08/22 1130   ABP     ART BP           Post Anesthesia Evaluation Note    Evaluation location: Pre/Post  Patient participation: recovered; patient participated in evaluation  Level of consciousness: alert  Pain management: adequate    Hydration: normovolemia  Temperature: 36.0C - 38.4C  Airway patency: adequate    Perioperative Events       Post-op nausea and vomiting: no PONV    Postoperative Status  Cardiovascular status: hemodynamically stable  Respiratory status: spontaneous ventilation  Follow-up needed: none        Perioperative Events  There were no known complications for this encounter.

## 2022-05-08 NOTE — Anesthesia Pre-Procedure Evaluation
Anesthesia Pre-Procedure Evaluation    Name: Tina Snyder      MRN: [id number]     DOB: 11/13/1965     Age: 57 y.o.     Sex: female   _________________________________________________________________________     Procedure Info:   Procedure Information       Date/Time: 05/08/22 1028    Procedures:       COLONOSCOPY WITH BIOPSY - FLEXIBLE      ESOPHAGOGASTRODUODENOSCOPY WITH BIOPSY - FLEXIBLE    Location: [facility] / ENDO/GI    Surgeons: Buckles, Vinnie Level, MD            Physical Assessment  Vital Signs (last filed in past 24 hours):  Height: 162.6 cm (5' 4) (01/17 1028)  Weight: 92.5 kg (204 lb) (01/17 1028)      Patient History   Allergies   Allergen Reactions    Bee Venom Protein (Honey Bee) ANAPHYLAXIS    Morphine HALLUCINATIONS     States she quits breathing      Bee [Bumble Bee] UNKNOWN    Ciprofloxacin NAUSEA AND VOMITING    Compazine [Prochlorperazine] HIVES and SEE COMMENTS     Tolerates Phenergan    Flagyl [Metronidazole] HIVES    Reglan [Metoclopramide] SEE COMMENTS     Patient states that she cannot tolerate.    Stadol [Butorphanol Tartrate] SEE COMMENTS     Patient states that it made her loopy and drunk.    Sulfa (Sulfonamide Antibiotics) HIVES and NAUSEA AND VOMITING    Tysabri [Natalizumab] SEE COMMENTS     JC virus positive.  Do not use Tysabri - high risk for PML    Walnut UNKNOWN    Zyrtec [Cetirizine] UNKNOWN    Coumadin [Warfarin] SEE COMMENTS     Hemorrhage    Lovenox [Enoxaparin] SEE COMMENTS     Hemorrhage    Pecan Nut UNKNOWN        Current Medications    Medication Directions   aluminum-magnesium hydroxide (MAG-AL) 200-200 mg/5 mL oral suspension Take 30 mL by mouth every 8 hours as needed.   atorvastatin (LIPITOR) 40 mg tablet TAKE 1 TABLET BY MOUTH DAILY   Blood-Glucose Sensor (DEXCOM G6 SENSOR) sensor device Use one each as directed as directed. Change every 10 days   budesonide (ENTOCORT EC) 3 mg capsule TAKE 2 CAPSULES BY MOUTH EVERY DAY   buPROPion XL (WELLBUTRIN XL) 300 mg tablet Take one tablet by mouth every morning. Do not crush or chew.   cyanocobalamin (vitamin B-12) (RUBRAMIN PC) 1,000 mcg/mL injection solution Inject 1 mL under the skin every 30 days.   cyclobenzaprine (FLEXERIL) 10 mg tablet TAKE ONE TABLET BY MOUTH AT BEDTIME DAILY.   dapagliflozin (FARXIGA) 5 mg tablet Take one tablet by mouth daily.   DEXCOM G7 SENSOR sensor device Use one each as directed before meals and at bedtime. Indications: type 2 diabetes mellitus   dexlansoprazole (DEXILANT) 60 mg capsule Take one capsule by mouth daily. Indications: gastroesophageal reflux disease   diazePAM (VALIUM) 5 mg tablet Take one tablet by mouth every 6 hours as needed for Anxiety.   diclofenac sodium (VOLTAREN) 1 % topical gel Apply four g topically to affected area four times daily.   duloxetine DR (CYMBALTA) 60 mg capsule Take one capsule by mouth daily.   EPINEPHrine (AUVI-Q) 1 mg/mL injection pen (2-Pack) Inject 0.3 mg (1 Pen) into thigh if needed for anaphylactic reaction. May repeat in 5-15 minutes if needed.   famotidine (PEPCID) 20 mg  tablet TAKE ONE TABLET BY MOUTH AT BEDTIME DAILY.   glucagon (BAQSIMI) 3 mg/actuation nasal spray Use 1 spray in single nostril. If no response, may repeat in 15 minutes using second device.   insulin aspart (U-100) (NOVOLOG FLEXPEN U-100 INSULIN) 100 unit/mL (3 mL) PEN Inject forty Units under the skin three times daily with meals. Indications: e11.65  Patient taking differently: Inject ten Units to twenty seven Units under the skin three times daily with meals. Indications: e11.65   insulin detemir U-100 (LEVEMIR FLEXPEN) 100 unit/mL (3 mL) injection pen Inject thirty four Units under the skin daily.  Patient taking differently: Inject forty Units to forty five Units under the skin daily.   insulin glargine U-300 conc (TOUJEO SOLOSTAR U-300 INSULIN) 300 unit/mL (1.5 mL) injectable PEN Inject forty six Units under the skin daily.   losartan (COZAAR) 100 mg tablet Take one tablet by mouth daily.   nebivoloL (BYSTOLIC) 10 mg tablet Take one tablet by mouth daily.   needle (disp) 25 gauge 25 gauge x 5/8 For Vitamin B12 injections   ondansetron HCL (ZOFRAN) 4 mg tablet Take one tablet by mouth every 8 hours.  Patient taking differently: Take one tablet by mouth every 8 hours as needed.   pantoprazole DR (PROTONIX) 40 mg tablet Take one tablet by mouth daily.   pen needle, diabetic (BD NANO 2ND GEN PEN NEEDLE) 32 gauge x 5/32 pen needle Use one each as directed before meals and at bedtime. Use with insulin injections.   polyethylene glycol 3350 (MIRALAX) 17 gram/dose powder Take seventeen g by mouth twice daily.   promethazine (PHENERGAN) 25 mg tablet Take one tablet by mouth every 6 hours as needed for Nausea or Vomiting.   sodium,potassium,mag sulfates (SUPREP BOWEL PREP KIT) 17.5-3.13-1.6 gram oral solution Evening prior: mix 1 bottle with 16 ounces of water and drink orally, followed by 32 additional ounces of water.  Day of: mix 2nd bottle and drink orally, followed by 32 ounces of water. Complete the bowel preparation at least 2 hrs before procedure.   Syringe (Disposable) 3 mL For vitamin B12 injections   traMADoL (ULTRAM) 50 mg tablet Take one tablet by mouth three times daily as needed for Pain.   triamcinolone acetonide (TRIDERM) 0.1 % topical cream Apply  topically to affected area twice daily.   vedolizumab (ENTYVIO IV) Administer  through vein every 8 weeks.       Review of Systems/Medical History      Patient summary reviewed  Nursing notes reviewed  Pertinent labs reviewed    PONV Screening: Non-smoker and Female sex    No history of anesthetic complications    No family history of anesthetic complications      Airway - negative        Pulmonary           No indications/hx of asthma (chronic cough)      Pneumonia (history of)      Hx pulmonary embolus (2009)        No recent URI      Shortness of breath: alll the time; no differences with positioning.        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 Left Ventricle: The left ventricular size is normal.  Concentric remodeling. The left ventricular systolic function is normal. The visually estimated ejection fraction is 65%. There are no segmental wall motion abnormalities. Normal left ventricular diastolic function.  ? Right Ventricle: The right ventricular size is normal. The right ventricular systolic function is normal.  ? Left Atrium: Normal size. Right Atrium: Normal size.  ? No hemodynamically significant valvular abnormalities.           Exercise tolerance: >4 METS       Beta Blocker therapy: Yes      Beta blockers within 24 hours: Yes      Hypertension, well controlled              No angina      DVT (Postop 2009)      Hyperlipidemia      Dyspnea on exertion      GI/Hepatic/Renal       Inflammatory bowel disease (Crohns)          GERD (has occasional breakthrough symptoms; none today.)        Liver disease (NASH):        Pancreatitis (2019/2020)       Renal disease: CRI         No nausea (chronic)      No vomiting      No gastroparesis      MRI 2024  IMPRESSION     1. Prior ileocolonic and distal colonic resection and anastomosis. No   evidence of active small bowel inflammation or obstruction.     2.  Unchanged right lower lobe nodular consolidation, which may represent   chronic mucous plugging/endobronchial impaction when read in conjunction   with prior CTs. Consider follow-up CT chest to evaluate for endobronchial   mass if clinically indicated.       Reports 20 plus abdominal surgeries. 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 facial droop left sided weakness, resolved.)      Headaches (Migraines, headache last two days)      Neuropathy (Fibromyalgia)      Weakness        Psychiatric history Depression      Musculoskeletal         Back pain      Fractures      Generalized weakness        Endocrine/Other       Diabetes (on insulin since 2017), poorly controlled, type 2, using insulin      Most recent Hgb A1C:< 7.2        Anemia      Blood dyscrasia      History of blood transfusion        Adrenal insufficiency (prednisone for pancreatitis and Crohn's)      Autoimmune disease (Crohn's)      Obesity        Has CGM    Constitution - negative       Physical Exam    Airway Findings      Mallampati: III      TM distance: >3 FB      Neck ROM: full      Mouth opening: good    Dental Findings:         Comments: 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     Cardiovascular Findings:       Rhythm: regular  Rate: normal    Pulmonary Findings:       Breath sounds clear to auscultation.    Abdominal Findings:       Obese    Neurological Findings:       Alert and oriented x 3    Constitutional findings:       No acute distress      Well-developed       Previous Airway Procedure Notes Displaying the 3 most recent records   No records found.         Patient Lines/Drains/Airways Status       Active Lines:       Name Placement date Placement time Site Days    Peripheral IV 04/27/22 1257 Right Anterior;Distal Upper Arm 20 G 04/27/22  1257  -- 11                  Diagnostic Tests  Hematology:   Lab Results   Component Value Date    HGB 12.6 03/22/2022    HCT 38.8 03/22/2022    PLTCT 264 03/22/2022    WBC 9.9 03/22/2022    NEUT 80 03/22/2022    ANC 7.97 03/22/2022    LYMPH 8 01/13/2020    ALC 1.39 03/22/2022    MONA 4 03/22/2022    AMC 0.41 03/22/2022    EOSA 1 03/22/2022    ABC 0.05 03/22/2022    MCV 85.4 03/22/2022    MCH 27.8 03/22/2022    MCHC 32.5 03/22/2022    MPV 8.8 03/22/2022    RDW 17.1 03/22/2022         General Chemistry:   Lab Results   Component Value Date    NA 140 03/05/2022    K 4.2 03/05/2022    CL 106 03/05/2022    CO2 24 03/05/2022    GAP 10 03/05/2022    BUN 18 03/05/2022    CR 1.13 03/05/2022    GLU 94 03/05/2022    CA 9.2 03/05/2022    ALBUMIN 4.0 03/05/2022    LACTIC 1.7 04/10/2018    OBSCA 1.22 10/25/2010    MG 2.0 12/16/2019    TOTBILI 0.5 03/05/2022    PO4 3.4 11/03/2020      Coagulation:   Lab Results   Component Value Date    PTT 26.5 03/02/2019    INR 1.0 03/02/2019    INR 0.9 03/02/2019       PAC Plan    Anesthesia Plan    ASA score: 3   Plan: MAC  Induction method: intravenous  NPO status: acceptable      Informed Consent  Anesthetic plan and risks discussed with patient.  Use of blood products discussed with patient  Blood Consent: consented      Plan discussed with: anesthesiologist and CRNA.  Comments: (0930)      Alerts: No

## 2022-05-08 NOTE — Anesthesia Pre-Procedure Evaluation
Anesthesia Pre-Procedure Evaluation    Name: Tina Snyder      MRN: [id number]     DOB: [birth year]     Age: 57 y.o.     Sex: female   _________________________________________________________________________     Procedure Info:   Procedure Information       Date/Time: 05/08/22 1200    Procedures:       COLONOSCOPY WITH BIOPSY - FLEXIBLE      ESOPHAGOGASTRODUODENOSCOPY WITH BIOPSY - FLEXIBLE    Location: ENDO 5 / ENDO/GI    Surgeons: Buckles, Vinnie Level, MD            Physical Assessment  Vital Signs (last filed in past 24 hours):         Patient History   Allergies   Allergen Reactions    Bee Venom Protein (Honey Bee) ANAPHYLAXIS    Morphine HALLUCINATIONS     States she quits breathing      Bee [Bumble Bee] UNKNOWN    Ciprofloxacin NAUSEA AND VOMITING    Compazine [Prochlorperazine] HIVES and SEE COMMENTS     Tolerates Phenergan    Flagyl [Metronidazole] HIVES    Reglan [Metoclopramide] SEE COMMENTS     Patient states that she cannot tolerate.    Stadol [Butorphanol Tartrate] SEE COMMENTS     Patient states that it made her loopy and drunk.    Sulfa (Sulfonamide Antibiotics) HIVES and NAUSEA AND VOMITING    Tysabri [Natalizumab] SEE COMMENTS     JC virus positive.  Do not use Tysabri - high risk for PML    Walnut UNKNOWN    Zyrtec [Cetirizine] UNKNOWN    Coumadin [Warfarin] SEE COMMENTS     Hemorrhage    Lovenox [Enoxaparin] SEE COMMENTS     Hemorrhage    Pecan Nut UNKNOWN        Current Medications    Medication Directions   aluminum-magnesium hydroxide (MAG-AL) 200-200 mg/5 mL oral suspension Take 30 mL by mouth every 8 hours as needed.   atorvastatin (LIPITOR) 40 mg tablet TAKE 1 TABLET BY MOUTH DAILY   Blood-Glucose Sensor (DEXCOM G6 SENSOR) sensor device Use one each as directed as directed. Change every 10 days   budesonide (ENTOCORT EC) 3 mg capsule TAKE 2 CAPSULES BY MOUTH EVERY DAY   buPROPion XL (WELLBUTRIN XL) 300 mg tablet Take one tablet by mouth every morning. Do not crush or chew. cyanocobalamin (vitamin B-12) (RUBRAMIN PC) 1,000 mcg/mL injection solution Inject 1 mL under the skin every 30 days.   cyclobenzaprine (FLEXERIL) 10 mg tablet TAKE ONE TABLET BY MOUTH AT BEDTIME DAILY.   dapagliflozin (FARXIGA) 5 mg tablet Take one tablet by mouth daily.   DEXCOM G7 SENSOR sensor device Use one each as directed before meals and at bedtime. Indications: type 2 diabetes mellitus   dexlansoprazole (DEXILANT) 60 mg capsule Take one capsule by mouth daily. Indications: gastroesophageal reflux disease   diazePAM (VALIUM) 5 mg tablet Take one tablet by mouth every 6 hours as needed for Anxiety.   diclofenac sodium (VOLTAREN) 1 % topical gel Apply four g topically to affected area four times daily.   duloxetine DR (CYMBALTA) 60 mg capsule Take one capsule by mouth daily.   EPINEPHrine (AUVI-Q) 1 mg/mL injection pen (2-Pack) Inject 0.3 mg (1 Pen) into thigh if needed for anaphylactic reaction. May repeat in 5-15 minutes if needed.   famotidine (PEPCID) 20 mg tablet TAKE ONE TABLET BY MOUTH AT BEDTIME DAILY.   glucagon (BAQSIMI) 3 mg/actuation  nasal spray Use 1 spray in single nostril. If no response, may repeat in 15 minutes using second device.   insulin aspart (U-100) (NOVOLOG FLEXPEN U-100 INSULIN) 100 unit/mL (3 mL) PEN Inject forty Units under the skin three times daily with meals. Indications: e11.65  Patient taking differently: Inject ten Units to twenty seven Units under the skin three times daily with meals. Indications: e11.65   insulin detemir U-100 (LEVEMIR FLEXPEN) 100 unit/mL (3 mL) injection pen Inject thirty four Units under the skin daily.  Patient taking differently: Inject forty Units to forty five Units under the skin daily.   insulin glargine U-300 conc (TOUJEO SOLOSTAR U-300 INSULIN) 300 unit/mL (1.5 mL) injectable PEN Inject forty six Units under the skin daily.   losartan (COZAAR) 100 mg tablet Take one tablet by mouth daily.   nebivoloL (BYSTOLIC) 10 mg tablet Take one tablet by mouth daily.   needle (disp) 25 gauge 25 gauge x 5/8 For Vitamin B12 injections   ondansetron HCL (ZOFRAN) 4 mg tablet Take one tablet by mouth every 8 hours.  Patient taking differently: Take one tablet by mouth every 8 hours as needed.   pantoprazole DR (PROTONIX) 40 mg tablet Take one tablet by mouth daily.   pen needle, diabetic (BD NANO 2ND GEN PEN NEEDLE) 32 gauge x 5/32 pen needle Use one each as directed before meals and at bedtime. Use with insulin injections.   polyethylene glycol 3350 (MIRALAX) 17 gram/dose powder Take seventeen g by mouth twice daily.   promethazine (PHENERGAN) 25 mg tablet Take one tablet by mouth every 6 hours as needed for Nausea or Vomiting.   sodium,potassium,mag sulfates (SUPREP BOWEL PREP KIT) 17.5-3.13-1.6 gram oral solution Evening prior: mix 1 bottle with 16 ounces of water and drink orally, followed by 32 additional ounces of water.  Day of: mix 2nd bottle and drink orally, followed by 32 ounces of water. Complete the bowel preparation at least 2 hrs before procedure.   Syringe (Disposable) 3 mL For vitamin B12 injections   traMADoL (ULTRAM) 50 mg tablet Take one tablet by mouth three times daily as needed for Pain.   triamcinolone acetonide (TRIDERM) 0.1 % topical cream Apply  topically to affected area twice daily.   vedolizumab (ENTYVIO IV) Administer  through vein every 8 weeks.       Review of Systems/Medical History      Patient summary reviewed  Nursing notes reviewed  Pertinent labs reviewed    PONV Screening: Non-smoker and Female sex    No history of anesthetic complications    No family history of anesthetic complications      Airway - negative        Pulmonary           No indications/hx of asthma (chronic cough)      Shortness of breath: alll the time; no differences with positioning.      MAI lung infection, chronic      Cardiovascular             03/21/20 TTE:  ? Left Ventricle: The left ventricular size is normal. Concentric remodeling. The left ventricular systolic function is normal. The visually estimated ejection fraction is 65%. There are no segmental wall motion abnormalities. Normal left ventricular diastolic function.  ? Right Ventricle: The right ventricular size is normal. The right ventricular systolic function is normal.  ? Left Atrium: Normal size. Right Atrium: Normal size.  ? No hemodynamically significant valvular abnormalities.  Exercise tolerance: <4 METS      Beta Blocker therapy: No      Hypertension, well controlled                DVT (Postop 2009)      Hyperlipidemia      GI/Hepatic/Renal       Inflammatory bowel disease (Crohns)          GERD, poorly controlled        Liver disease (NASH):        Pancreatitis       Renal disease: CRI         Nausea (chronic)        Neuro/Psych       No seizures      Headaches (Migraines, last 1 week ago)      Neuropathy (Fibromyalgia)        Psychiatric history          Depression      Musculoskeletal         Back pain      Generalized weakness        Endocrine/Other       Diabetes (on insulin since 2017), poorly controlled, type 2, using insulin          Adrenal insufficiency (prednisone for pancreatitis and Crohn's)      Autoimmune disease (Crohn's)      Obesity      Constitution - negative       Physical Exam    Airway Findings      Mallampati: III      TM distance: >3 FB      Neck ROM: full      Mouth opening: good    Dental Findings:       Upper dentures and lower dentures    Cardiovascular Findings:       Rhythm: regular      Rate: normal    Pulmonary Findings:       Breath sounds clear to auscultation.    Abdominal Findings:       Obese    Neurological Findings:       Alert and oriented x 3    Constitutional findings:       No acute distress       Previous Airway Procedure Notes Displaying the 3 most recent records   No records found.         Patient Lines/Drains/Airways Status       Active Lines:       Name Placement date Placement time Site Days    Peripheral IV 04/27/22 1257 Right Anterior;Distal Upper Arm 20 G 04/27/22  1257  -- 11                  Diagnostic Tests  Hematology:   Lab Results   Component Value Date    HGB 12.6 03/22/2022    HCT 38.8 03/22/2022    PLTCT 264 03/22/2022    WBC 9.9 03/22/2022    NEUT 80 03/22/2022    ANC 7.97 03/22/2022    LYMPH 8 01/13/2020    ALC 1.39 03/22/2022    MONA 4 03/22/2022    AMC 0.41 03/22/2022    EOSA 1 03/22/2022    ABC 0.05 03/22/2022    MCV 85.4 03/22/2022    MCH 27.8 03/22/2022    MCHC 32.5 03/22/2022    MPV 8.8 03/22/2022    RDW 17.1 03/22/2022  General Chemistry:   Lab Results   Component Value Date    NA 140 03/05/2022    K 4.2 03/05/2022    CL 106 03/05/2022    CO2 24 03/05/2022    GAP 10 03/05/2022    BUN 18 03/05/2022    CR 1.13 03/05/2022    GLU 94 03/05/2022    CA 9.2 03/05/2022    ALBUMIN 4.0 03/05/2022    LACTIC 1.7 04/10/2018    OBSCA 1.22 10/25/2010    MG 2.0 12/16/2019    TOTBILI 0.5 03/05/2022    PO4 3.4 11/03/2020      Coagulation:   Lab Results   Component Value Date    PTT 26.5 03/02/2019    INR 1.0 03/02/2019    INR 0.9 03/02/2019       PAC Plan    Anesthesia Plan    ASA score: 3   Plan: MAC  Induction method: intravenous  NPO status: acceptable      Informed Consent  Anesthetic plan and risks discussed with patient.  Use of blood products discussed with patient  Blood Consent: consented      Plan discussed with: anesthesiologist and CRNA.  Comments: (Upon arrival to GI bay patient chewing gum. Removed and team made aware. Patient reports elevated BP due to white coat syndrome)      Alerts: No

## 2022-05-09 ENCOUNTER — Encounter: Admit: 2022-05-09 | Discharge: 2022-05-09 | Payer: BC Managed Care – PPO

## 2022-05-09 ENCOUNTER — Ambulatory Visit: Admit: 2022-05-09 | Discharge: 2022-05-10 | Payer: BC Managed Care – PPO

## 2022-05-09 DIAGNOSIS — IMO0002 Ulcer: Secondary | ICD-10-CM

## 2022-05-09 DIAGNOSIS — E785 Hyperlipidemia, unspecified: Secondary | ICD-10-CM

## 2022-05-09 DIAGNOSIS — K5732 Diverticulitis of large intestine without perforation or abscess without bleeding: Secondary | ICD-10-CM

## 2022-05-09 DIAGNOSIS — H9190 Unspecified hearing loss, unspecified ear: Secondary | ICD-10-CM

## 2022-05-09 DIAGNOSIS — R110 Nausea: Secondary | ICD-10-CM

## 2022-05-09 DIAGNOSIS — D4959 Neoplasm of unspecified behavior of other genitourinary organ: Secondary | ICD-10-CM

## 2022-05-09 DIAGNOSIS — R011 Cardiac murmur, unspecified: Secondary | ICD-10-CM

## 2022-05-09 DIAGNOSIS — E669 Obesity, unspecified: Secondary | ICD-10-CM

## 2022-05-09 DIAGNOSIS — F419 Anxiety disorder, unspecified: Secondary | ICD-10-CM

## 2022-05-09 DIAGNOSIS — Z8619 Personal history of other infectious and parasitic diseases: Secondary | ICD-10-CM

## 2022-05-09 DIAGNOSIS — J984 Other disorders of lung: Secondary | ICD-10-CM

## 2022-05-09 DIAGNOSIS — E119 Type 2 diabetes mellitus without complications: Secondary | ICD-10-CM

## 2022-05-09 DIAGNOSIS — M797 Fibromyalgia: Secondary | ICD-10-CM

## 2022-05-09 DIAGNOSIS — F32A Depression: Secondary | ICD-10-CM

## 2022-05-09 DIAGNOSIS — M2550 Pain in unspecified joint: Secondary | ICD-10-CM

## 2022-05-09 DIAGNOSIS — I82409 Acute embolism and thrombosis of unspecified deep veins of unspecified lower extremity: Secondary | ICD-10-CM

## 2022-05-09 DIAGNOSIS — I2699 Other pulmonary embolism without acute cor pulmonale: Secondary | ICD-10-CM

## 2022-05-09 DIAGNOSIS — D699 Hemorrhagic condition, unspecified: Secondary | ICD-10-CM

## 2022-05-09 DIAGNOSIS — N183 CKD (chronic kidney disease) stage 3, GFR 30-59 ml/min (HCC): Secondary | ICD-10-CM

## 2022-05-09 DIAGNOSIS — J302 Other seasonal allergic rhinitis: Secondary | ICD-10-CM

## 2022-05-09 DIAGNOSIS — K7581 Nonalcoholic steatohepatitis (NASH): Secondary | ICD-10-CM

## 2022-05-09 DIAGNOSIS — R42 Dizziness and giddiness: Secondary | ICD-10-CM

## 2022-05-09 DIAGNOSIS — I6350 Cerebral infarction due to unspecified occlusion or stenosis of unspecified cerebral artery: Secondary | ICD-10-CM

## 2022-05-09 DIAGNOSIS — R9389 Abnormal findings on diagnostic imaging of other specified body structures: Secondary | ICD-10-CM

## 2022-05-09 DIAGNOSIS — K859 Acute pancreatitis without necrosis or infection, unspecified: Secondary | ICD-10-CM

## 2022-05-09 DIAGNOSIS — I10 Essential (primary) hypertension: Secondary | ICD-10-CM

## 2022-05-09 DIAGNOSIS — G4733 Obstructive sleep apnea (adult) (pediatric): Secondary | ICD-10-CM

## 2022-05-09 DIAGNOSIS — I8290 Acute embolism and thrombosis of unspecified vein: Secondary | ICD-10-CM

## 2022-05-09 DIAGNOSIS — K639 Disease of intestine, unspecified: Secondary | ICD-10-CM

## 2022-05-09 DIAGNOSIS — E139 Other specified diabetes mellitus without complications: Secondary | ICD-10-CM

## 2022-05-09 DIAGNOSIS — K589 Irritable bowel syndrome without diarrhea: Secondary | ICD-10-CM

## 2022-05-09 DIAGNOSIS — G43909 Migraine, unspecified, not intractable, without status migrainosus: Secondary | ICD-10-CM

## 2022-05-09 DIAGNOSIS — K5900 Constipation, unspecified: Secondary | ICD-10-CM

## 2022-05-09 DIAGNOSIS — Z789 Other specified health status: Secondary | ICD-10-CM

## 2022-05-09 DIAGNOSIS — K56609 Unspecified intestinal obstruction, unspecified as to partial versus complete obstruction: Secondary | ICD-10-CM

## 2022-05-09 DIAGNOSIS — G40909 Epilepsy, unspecified, not intractable, without status epilepticus: Secondary | ICD-10-CM

## 2022-05-09 DIAGNOSIS — K5090 Crohn's disease, unspecified, without complications: Secondary | ICD-10-CM

## 2022-05-09 DIAGNOSIS — K219 Gastro-esophageal reflux disease without esophagitis: Secondary | ICD-10-CM

## 2022-05-09 NOTE — Patient Instructions
Please contact Sarah RN for any questions or concerns at [phone]

## 2022-05-10 ENCOUNTER — Encounter: Admit: 2022-05-10 | Discharge: 2022-05-10 | Payer: BC Managed Care – PPO

## 2022-05-10 DIAGNOSIS — E139 Other specified diabetes mellitus without complications: Secondary | ICD-10-CM

## 2022-05-10 DIAGNOSIS — R42 Dizziness and giddiness: Secondary | ICD-10-CM

## 2022-05-10 DIAGNOSIS — K5732 Diverticulitis of large intestine without perforation or abscess without bleeding: Secondary | ICD-10-CM

## 2022-05-10 DIAGNOSIS — G43909 Migraine, unspecified, not intractable, without status migrainosus: Secondary | ICD-10-CM

## 2022-05-10 DIAGNOSIS — H9190 Unspecified hearing loss, unspecified ear: Secondary | ICD-10-CM

## 2022-05-10 DIAGNOSIS — IMO0002 Ulcer: Secondary | ICD-10-CM

## 2022-05-10 DIAGNOSIS — F32A Depression: Secondary | ICD-10-CM

## 2022-05-10 DIAGNOSIS — J302 Other seasonal allergic rhinitis: Secondary | ICD-10-CM

## 2022-05-10 DIAGNOSIS — M797 Fibromyalgia: Secondary | ICD-10-CM

## 2022-05-10 DIAGNOSIS — G40909 Epilepsy, unspecified, not intractable, without status epilepticus: Secondary | ICD-10-CM

## 2022-05-10 DIAGNOSIS — D699 Hemorrhagic condition, unspecified: Secondary | ICD-10-CM

## 2022-05-10 DIAGNOSIS — K7581 Nonalcoholic steatohepatitis (NASH): Secondary | ICD-10-CM

## 2022-05-10 DIAGNOSIS — K56609 Unspecified intestinal obstruction, unspecified as to partial versus complete obstruction: Secondary | ICD-10-CM

## 2022-05-10 DIAGNOSIS — K5900 Constipation, unspecified: Secondary | ICD-10-CM

## 2022-05-10 DIAGNOSIS — K639 Disease of intestine, unspecified: Secondary | ICD-10-CM

## 2022-05-10 DIAGNOSIS — F419 Anxiety disorder, unspecified: Secondary | ICD-10-CM

## 2022-05-10 DIAGNOSIS — I6350 Cerebral infarction due to unspecified occlusion or stenosis of unspecified cerebral artery: Secondary | ICD-10-CM

## 2022-05-10 DIAGNOSIS — I10 Essential (primary) hypertension: Secondary | ICD-10-CM

## 2022-05-10 DIAGNOSIS — K589 Irritable bowel syndrome without diarrhea: Secondary | ICD-10-CM

## 2022-05-10 DIAGNOSIS — Z789 Other specified health status: Secondary | ICD-10-CM

## 2022-05-10 DIAGNOSIS — E785 Hyperlipidemia, unspecified: Secondary | ICD-10-CM

## 2022-05-10 DIAGNOSIS — K5090 Crohn's disease, unspecified, without complications: Secondary | ICD-10-CM

## 2022-05-10 DIAGNOSIS — D4959 Neoplasm of unspecified behavior of other genitourinary organ: Secondary | ICD-10-CM

## 2022-05-10 DIAGNOSIS — K219 Gastro-esophageal reflux disease without esophagitis: Secondary | ICD-10-CM

## 2022-05-10 DIAGNOSIS — K859 Acute pancreatitis without necrosis or infection, unspecified: Secondary | ICD-10-CM

## 2022-05-10 DIAGNOSIS — N183 CKD (chronic kidney disease) stage 3, GFR 30-59 ml/min (HCC): Secondary | ICD-10-CM

## 2022-05-10 DIAGNOSIS — R110 Nausea: Secondary | ICD-10-CM

## 2022-05-10 DIAGNOSIS — E119 Type 2 diabetes mellitus without complications: Secondary | ICD-10-CM

## 2022-05-10 DIAGNOSIS — I82409 Acute embolism and thrombosis of unspecified deep veins of unspecified lower extremity: Secondary | ICD-10-CM

## 2022-05-10 DIAGNOSIS — I2699 Other pulmonary embolism without acute cor pulmonale: Secondary | ICD-10-CM

## 2022-05-10 DIAGNOSIS — I8290 Acute embolism and thrombosis of unspecified vein: Secondary | ICD-10-CM

## 2022-05-10 DIAGNOSIS — J984 Other disorders of lung: Secondary | ICD-10-CM

## 2022-05-10 DIAGNOSIS — Z8619 Personal history of other infectious and parasitic diseases: Secondary | ICD-10-CM

## 2022-05-10 DIAGNOSIS — R011 Cardiac murmur, unspecified: Secondary | ICD-10-CM

## 2022-05-10 DIAGNOSIS — M2550 Pain in unspecified joint: Secondary | ICD-10-CM

## 2022-05-10 NOTE — Telephone Encounter
-----  Message from Gailen Shelter, RN sent at 05/09/2022  4:44 PM CST -----  Please send CT chest order to [facility] in [place] thank you

## 2022-05-15 ENCOUNTER — Encounter: Admit: 2022-05-15 | Discharge: 2022-05-15 | Payer: BC Managed Care – PPO

## 2022-05-15 ENCOUNTER — Ambulatory Visit: Admit: 2022-05-15 | Discharge: 2022-05-16 | Payer: BC Managed Care – PPO

## 2022-05-15 DIAGNOSIS — K50918 Crohn's disease, unspecified, with other complication: Secondary | ICD-10-CM

## 2022-05-15 DIAGNOSIS — H9190 Unspecified hearing loss, unspecified ear: Secondary | ICD-10-CM

## 2022-05-15 DIAGNOSIS — K589 Irritable bowel syndrome without diarrhea: Secondary | ICD-10-CM

## 2022-05-15 DIAGNOSIS — K639 Disease of intestine, unspecified: Secondary | ICD-10-CM

## 2022-05-15 DIAGNOSIS — K5732 Diverticulitis of large intestine without perforation or abscess without bleeding: Secondary | ICD-10-CM

## 2022-05-15 DIAGNOSIS — R42 Dizziness and giddiness: Secondary | ICD-10-CM

## 2022-05-15 DIAGNOSIS — F331 Major depressive disorder, recurrent, moderate: Secondary | ICD-10-CM

## 2022-05-15 DIAGNOSIS — G40909 Epilepsy, unspecified, not intractable, without status epilepticus: Secondary | ICD-10-CM

## 2022-05-15 DIAGNOSIS — K859 Acute pancreatitis without necrosis or infection, unspecified: Secondary | ICD-10-CM

## 2022-05-15 DIAGNOSIS — F32A Depression: Secondary | ICD-10-CM

## 2022-05-15 DIAGNOSIS — I8290 Acute embolism and thrombosis of unspecified vein: Secondary | ICD-10-CM

## 2022-05-15 DIAGNOSIS — I10 Essential (primary) hypertension: Secondary | ICD-10-CM

## 2022-05-15 DIAGNOSIS — K7581 Nonalcoholic steatohepatitis (NASH): Secondary | ICD-10-CM

## 2022-05-15 DIAGNOSIS — F419 Anxiety disorder, unspecified: Secondary | ICD-10-CM

## 2022-05-15 DIAGNOSIS — K219 Gastro-esophageal reflux disease without esophagitis: Secondary | ICD-10-CM

## 2022-05-15 DIAGNOSIS — I82B12 Acute embolism and thrombosis of left subclavian vein: Secondary | ICD-10-CM

## 2022-05-15 DIAGNOSIS — J984 Other disorders of lung: Secondary | ICD-10-CM

## 2022-05-15 DIAGNOSIS — Z8619 Personal history of other infectious and parasitic diseases: Secondary | ICD-10-CM

## 2022-05-15 DIAGNOSIS — E785 Hyperlipidemia, unspecified: Secondary | ICD-10-CM

## 2022-05-15 DIAGNOSIS — Z789 Other specified health status: Secondary | ICD-10-CM

## 2022-05-15 DIAGNOSIS — I6350 Cerebral infarction due to unspecified occlusion or stenosis of unspecified cerebral artery: Secondary | ICD-10-CM

## 2022-05-15 DIAGNOSIS — D699 Hemorrhagic condition, unspecified: Secondary | ICD-10-CM

## 2022-05-15 DIAGNOSIS — R011 Cardiac murmur, unspecified: Secondary | ICD-10-CM

## 2022-05-15 DIAGNOSIS — K56609 Unspecified intestinal obstruction, unspecified as to partial versus complete obstruction: Secondary | ICD-10-CM

## 2022-05-15 DIAGNOSIS — I82409 Acute embolism and thrombosis of unspecified deep veins of unspecified lower extremity: Secondary | ICD-10-CM

## 2022-05-15 DIAGNOSIS — M797 Fibromyalgia: Secondary | ICD-10-CM

## 2022-05-15 DIAGNOSIS — E119 Type 2 diabetes mellitus without complications: Secondary | ICD-10-CM

## 2022-05-15 DIAGNOSIS — E1122 Type 2 diabetes mellitus with diabetic chronic kidney disease: Secondary | ICD-10-CM

## 2022-05-15 DIAGNOSIS — M2550 Pain in unspecified joint: Secondary | ICD-10-CM

## 2022-05-15 DIAGNOSIS — K5090 Crohn's disease, unspecified, without complications: Secondary | ICD-10-CM

## 2022-05-15 DIAGNOSIS — I2699 Other pulmonary embolism without acute cor pulmonale: Secondary | ICD-10-CM

## 2022-05-15 DIAGNOSIS — K5900 Constipation, unspecified: Secondary | ICD-10-CM

## 2022-05-15 DIAGNOSIS — IMO0002 Ulcer: Secondary | ICD-10-CM

## 2022-05-15 DIAGNOSIS — D4959 Neoplasm of unspecified behavior of other genitourinary organ: Secondary | ICD-10-CM

## 2022-05-15 DIAGNOSIS — N183 CKD (chronic kidney disease) stage 3, GFR 30-59 ml/min (HCC): Secondary | ICD-10-CM

## 2022-05-15 DIAGNOSIS — E139 Other specified diabetes mellitus without complications: Secondary | ICD-10-CM

## 2022-05-15 DIAGNOSIS — N1831 Stage 3a chronic kidney disease (HCC): Secondary | ICD-10-CM

## 2022-05-15 DIAGNOSIS — R110 Nausea: Secondary | ICD-10-CM

## 2022-05-15 DIAGNOSIS — A310 Pulmonary mycobacterial infection: Secondary | ICD-10-CM

## 2022-05-15 DIAGNOSIS — G43909 Migraine, unspecified, not intractable, without status migrainosus: Secondary | ICD-10-CM

## 2022-05-15 DIAGNOSIS — J302 Other seasonal allergic rhinitis: Secondary | ICD-10-CM

## 2022-05-15 MED ORDER — CHLORTHALIDONE 25 MG PO TAB
25 mg | ORAL_TABLET | Freq: Every day | ORAL | 3 refills | Status: AC
Start: 2022-05-15 — End: ?

## 2022-05-15 NOTE — Progress Notes
Tina Snyder is a 57 y.o. female.    INTERVAL hx  She is getting 3 genioturinary fungal infections in a month.  Feels like it is almost chronic.  Her urine smells like farxiga.  Itchy, responds to fluconazole.     She has gained a significant amount of weight since last summer.   Her BMI is currently 39.     Symptoms of depression are worse.     She does not drink alcohol.     Her blood pressures are not controlled - we were able to pull off many of her medications with her weight loss.      Incidental finding of pulmonary mass on recent MR enterography with follow up with Dr. Evonnie Pat.    History of pancreatitis:  She was initiated on [facility] 01/29/19. Completed induction 02/26/19. She was admitted for 8 days in August 2021 for a small bowel obstruction which was managed conservatively. That hospitalization was complicated by acute pancreatitis, work up negative for gallstones or ductal dilation. Triglycerides were not elevated. She underwent an EUS in Novemeber 2021 which showed evidence of chronic pancreatitis without stones or mass lesion. Continues to take Entyvio every 8 weeks. She had an MR enterography earlier this month which showed a normal pancreas.    CHRONIC hx  Depression  Wellbutrin 300mg ,   Prior: Fluoxetine 10mg  Prozac, Duloxetine 60mg  daily, elavil 50mg , elavil 10mg  (not taking)  H.o hospitalization as a teenage for SI.  She had a nervous breakdown in 2004 and was hospitalized for 3 days.      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 She was briefly on coumadin but stopped in setting of recurrent bleeding. She has a history of subclavian thrombosis. Recent CT imaging showed a lot of chest wall collaterals.  Pt not aware.  Not on any therapy.  [place] of her left arm showed no DVT. She had a negative VQ scan for PE. She had a CT chest 2016 Development of anterior superior mediastinal vascular collaterals secondary to marked narrowing or occlusion of the left brachiocephalic vein. 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  She had a CTA chest in 2019 with Dr. Hollie Beach and left brachiocephalic vein atresia noted with prominent collaterals. Dr. Gaspar Bidding felt she may need other systemic evaluation to help determine the cause of her swelling.  Her anti CCP and RF were negative in 2020.    Peripheral edema:  Progressive edema in her bilateral arms, underarms and chest wall and additionally in her feet. Started in LUE predominantly. Noted in RUE after a prolonged hospital stay in 2019 where she lay on her right side.  She has been on lasix since 2018.  She has a history of subclavian thrombus with chest wall collaterals in addition to intermittent steroids.  Hands are getting thicker, had to change ring size.  Ruddy skin complexion is new.     History of Iron Deficiency Anemia  Resolved with IV iron.    History of Syncope:  She has seen cardiology. Diltiazem held due to orthostatic concerns. She had a TTE on 07/02/2017 which showed EF 70%.  Carotid u/s was unremarkable. She had an EEG on 06/05/2017 which was fine.   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 Chronic NSAID use considered.     Crohns  [facility], GI  Budesonide 3mg ,  entyvio  Prior: mtx (fracture), entecort    Chronic Pancreatitis  [facility], [facility]  Dx in August 2021 after discovery following SBO.  Lipase > 1000.   EUS in 02/2020 showed evidence of chronic pancreatitis.     MAI:   [place], Pulmonary.   She has been on three times weekly azithromycin in the past.     Diabetes Management:  Current regimen: glargine 30 w/ aspart 16-20 w/ meals  Endocrine:  Yes We have been avoiding SGLT2/GLP-1 in this patient due to recurrent yeast infections as well as Crohn's disease.  Complications:  Nephropathy, Gastropathy/Delayed gastric emptying, CAD, PVD  Poc Hemoglobin A1C   Date Value Ref Range Status   02/13/2022 6.2 (A) 4 - 6 % Final   08/23/2021 6.6 (H) 4 - 6 % Final   12/27/2020 6.2 (A) 4 - 6 % Final     Lab Results   Component Value Date/Time    HGBA1C 6.8 (H) 10/14/2019 02:46 PM    HGBA1C 9.1 (H) 11/16/2018 11:13 AM    HGBA1C 9.3 (H) 10/24/2015 03:50 AM    A1C 6.2 (A) 02/13/2022 02:37 PM    CHOL 132 08/27/2021 01:40 PM    TRIG 104 08/27/2021 01:40 PM    HDL 43 08/27/2021 01:40 PM    LDL 73 08/27/2021 01:40 PM    VLDL 21 08/27/2021 01:40 PM    NONHDLCHOL 89 08/27/2021 01:40 PM    CR 1.13 (H) 03/05/2022 04:02 PM    MCALBR 21.3 08/27/2021 01:40 PM    Imp: Diabetes  unknown      Hypertension Management:    Current regimen: bystolic 10mg  daily, losartan 100mg   Prior: hydralazine 25mg  three times a day, diltiazem 240mg  daily,bumex 1mg ,  Spironolactone 50mg  tablet, Entivio   CVD Hx: none  Smoker: No  Outside blood pressures being performed: Yes  BP Readings from Last 3 Encounters:   05/15/22 (!) 173/101   05/09/22 (!) 174/94   05/08/22 (!) 153/77     Fibromyalgia  Prior: Duloxetine 40mg  daily, flexeril prn    Sleep Apnea  CPAP  Sleep study 10/03/2017    Social:  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   HENT:  Negative for congestion and sore throat.    Eyes:  Negative for visual disturbance.   Respiratory:  Negative for cough, chest tightness, shortness of breath and wheezing.    Cardiovascular:  Negative for chest pain, palpitations and leg swelling.   Gastrointestinal:  Positive for abdominal pain. Negative for blood in stool, nausea and vomiting.   Genitourinary:  Negative for difficulty urinating and urgency.   Musculoskeletal:  Negative for arthralgias, joint swelling, myalgias and neck pain.   Skin:  Negative for color change and rash.   Neurological:  Negative for dizziness, speech difficulty, weakness, light-headedness, numbness and headaches.   Psychiatric/Behavioral:  Negative for agitation, behavioral problems, confusion, decreased concentration, dysphoric mood, hallucinations, self-injury, sleep disturbance and suicidal ideas. The patient is not nervous/anxious and is not hyperactive.          Objective:          aluminum-magnesium hydroxide (MAG-AL) 200-200 mg/5 mL oral suspension Take 30 mL by mouth every 8 hours as needed.    atorvastatin (LIPITOR) 40 mg tablet TAKE 1 TABLET BY MOUTH DAILY  budesonide (ENTOCORT EC) 3 mg capsule TAKE 2 CAPSULES BY MOUTH EVERY DAY    buPROPion XL (WELLBUTRIN XL) 300 mg tablet Take one tablet by mouth every morning. Do not crush or chew.    cyanocobalamin (vitamin B-12) (RUBRAMIN PC) 1,000 mcg/mL injection solution Inject 1 mL under the skin every 30 days.    cyclobenzaprine (FLEXERIL) 10 mg tablet TAKE ONE TABLET BY MOUTH AT BEDTIME DAILY.    dapagliflozin (FARXIGA) 5 mg tablet Take one tablet by mouth daily.    DEXCOM G7 SENSOR sensor device Use one each as directed before meals and at bedtime. Indications: type 2 diabetes mellitus    dexlansoprazole (DEXILANT) 60 mg capsule Take one capsule by mouth daily. Indications: gastroesophageal reflux disease    diazePAM (VALIUM) 5 mg tablet Take one tablet by mouth every 6 hours as needed for Anxiety.    diclofenac sodium (VOLTAREN) 1 % topical gel Apply four g topically to affected area four times daily.    duloxetine DR (CYMBALTA) 60 mg capsule Take one capsule by mouth daily.    EPINEPHrine (AUVI-Q) 1 mg/mL injection pen (2-Pack) Inject 0.3 mg (1 Pen) into thigh if needed for anaphylactic reaction. May repeat in 5-15 minutes if needed.    famotidine (PEPCID) 40 mg tablet Take one tablet by mouth twice daily.    glucagon (BAQSIMI) 3 mg/actuation nasal spray Use 1 spray in single nostril. If no response, may repeat in 15 minutes using second device.    insulin aspart (U-100) (NOVOLOG FLEXPEN U-100 INSULIN) 100 unit/mL (3 mL) PEN Inject forty Units under the skin three times daily with meals. Indications: e11.65 (Patient taking differently: Inject ten Units to twenty seven Units under the skin three times daily with meals. Indications: e11.65)    insulin degludec (TRESIBA FLEXTOUCH U-200) 200 unit/mL (3 mL) suncutaneous PEN Inject forty six Units under the skin daily. Indications: type 2 diabetes mellitus    insulin glargine U-300 conc (TOUJEO SOLOSTAR U-300 INSULIN) 300 unit/mL (1.5 mL) injectable PEN Inject forty six Units under the skin daily.    losartan (COZAAR) 100 mg tablet Take one tablet by mouth daily.    nebivoloL (BYSTOLIC) 10 mg tablet Take one tablet by mouth daily.    needle (disp) 25 gauge 25 gauge x 5/8 For Vitamin B12 injections    ondansetron HCL (ZOFRAN) 4 mg tablet Take one tablet by mouth every 8 hours. (Patient taking differently: Take one tablet by mouth every 8 hours as needed.)    pantoprazole DR (PROTONIX) 40 mg tablet Take one tablet by mouth daily.    pen needle, diabetic (BD NANO 2ND GEN PEN NEEDLE) 32 gauge x 5/32 pen needle Use one each as directed before meals and at bedtime. Use with insulin injections.    polyethylene glycol 3350 (MIRALAX) 17 gram/dose powder Take seventeen g by mouth twice daily.    promethazine (PHENERGAN) 25 mg tablet Take one tablet by mouth every 6 hours as needed for Nausea or Vomiting.    sodium,potassium,mag sulfates (SUPREP BOWEL PREP KIT) 17.5-3.13-1.6 gram oral solution Evening prior: mix 1 bottle with 16 ounces of water and drink orally, followed by 32 additional ounces of water.  Day of: mix 2nd bottle and drink orally, followed by 32 ounces of water. Complete the bowel preparation at least 2 hrs before procedure.    Syringe (Disposable) 3 mL For vitamin B12 injections    traMADoL (ULTRAM) 50 mg tablet Take one tablet by mouth three times daily as needed for Pain.  vedolizumab (ENTYVIO IV) Administer  through vein every 8 weeks.     Vitals:    05/15/22 1118 05/15/22 1127   BP: (!) 187/106  Comment: PT TOOK BP MEDS AT 8AM (!) 173/101   BP Source: Arm, Right Lower Arm, Right Lower   Pulse: 77 74   Temp: 36.7 ?C (98 ?F)    Resp: 20    SpO2: 98%    PainSc: Six  Comment: HEADACHE    Weight: 103.4 kg (228 lb)    Height: 162.6 cm (5' 4)      Vitals:    05/15/22 1118 05/15/22 1127   BP: (!) 187/106  Comment: PT TOOK BP MEDS AT 8AM (!) 173/101   BP Source: Arm, Right Lower Arm, Right Lower   Pulse: 77 74   Temp: 36.7 ?C (98 ?F)    Resp: 20    SpO2: 98%    PainSc: Six  Comment: HEADACHE    Weight: 103.4 kg (228 lb)    Height: 162.6 cm (5' 4)        Body mass index is 39.14 kg/m?Marland Kitchen     Physical Exam  Vitals and nursing note reviewed.   Constitutional:       Appearance: Normal appearance. She is well-developed.   HENT:      Head: Normocephalic and atraumatic.   Eyes:      Conjunctiva/sclera: Conjunctivae normal.   Cardiovascular:      Rate and Rhythm: Normal rate.   Pulmonary:      Effort: Pulmonary effort is normal.      Breath sounds: Normal breath sounds.   Abdominal:      General: Bowel sounds are normal.      Palpations: Abdomen is soft.   Musculoskeletal:         General: Normal range of motion.      Cervical back: Normal range of motion.   Skin:     General: Skin is warm and dry.   Neurological:      Mental Status: She is alert.   Psychiatric:         Mood and Affect: Mood normal.         Behavior: Behavior normal.         Thought Content: Thought content normal.         Judgment: Judgment normal.         Labwork reviewed:  Lab Results   Component Value Date/Time    HGBA1C 6.8 (H) 10/14/2019 02:46 PM    HGBA1C 9.1 (H) 11/16/2018 11:13 AM    HGBA1C 9.3 (H) 10/24/2015 03:50 AM    A1C 6.2 (A) 02/13/2022 02:37 PM    MCALBR 21.3 08/27/2021 01:40 PM    TSH 2.39 08/27/2021 01:40 PM    FREET4R 0.7 09/25/2007 12:30 PM    CHOL 132 08/27/2021 01:40 PM    TRIG 104 08/27/2021 01:40 PM    HDL 43 08/27/2021 01:40 PM    LDL 73 08/27/2021 01:40 PM    NA 140 03/05/2022 04:02 PM    K 4.2 03/05/2022 04:02 PM    CL 106 03/05/2022 04:02 PM    CO2 24 03/05/2022 04:02 PM    GAP 10 03/05/2022 04:02 PM    BUN 18 03/05/2022 04:02 PM    CR 1.13 (H) 03/05/2022 04:02 PM    GLU 94 03/05/2022 04:02 PM    CA 9.2 03/05/2022 04:02 PM    PO4 3.4 11/03/2020 11:07 AM    ALBUMIN 4.0 03/05/2022 04:02  PM    TOTPROT 6.9 03/05/2022 04:02 PM    ALKPHOS 127 (H) 03/05/2022 04:02 PM    AST 16 03/05/2022 04:02 PM    ALT 14 03/05/2022 04:02 PM    TOTBILI 0.5 03/05/2022 04:02 PM    GFR 43 (L) 03/23/2020 01:04 PM    GFRAA 52 (L) 03/23/2020 01:04 PM            Assessment and Plan:    Tina Snyder was seen today    Diagnoses and all orders for this visit:    Pulmonary Consolidation on imaging  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 She is going to follow up with Dr. Evonnie Pat.  > Inactive. Following w/ [facility] currently    Hypertension  CKD  Not controlled  > continue bystolic  > continue losartan 100mg   > start chlorthalidone 25  > BMI reduction    Type 2 diabetes mellitus without complication, without long-term current use of insulin (HCC):   Recurrent genitourinary yeast  Follows in endocrine here and well controlled.   She  Excellent control  > stop SGLT2 unfortunately 2/2 recurrent genitourinary yeast  > continue insulin  > LDL at goal 70    BMI 39  Prior history of recurrent Acute pancreatitis with EUS findings suggesting chronic.   Uncontrolled Hypertension  Fatty Liver  She would be an ideal candidate for a glp-1 but it is contraindicated in the context of her history of pancreatitis.  As we are 3 years out, and imaging of her pancreas has been normal (albeit not dedicated to the pancreas) I wonder if a trial a tirzapetide would be indicated as the impact of her obesity on her posthrombotic syndrome, hypertension, diabetes and mental health is profound and trials of diet and exercise not sustainable.  I will reach out to Dr. Janene Madeira.       Depression - Major- partial  Fibromyalgia  Not controlled.   > continue duloxetine  > continue wellbutrin to 300  > encourage ongoing discussion w/ pastor.    Crohn's disease of small intestine with other complication (HCC):   Chronic and active. Normal stool.   > colonoscopy up to date  > reviewed recommendations  > b12 monthly    Elevated Alk Phos / Normal GGTP  CKD3  Reviewed work up which has included liver u/s and bone scan were all reassuring. Certainly at risk for sclerosing cholangitis w/ her history of crohns but the GGTP was normal.  This can all be renal associated.   We can continue to follow.     OSA: Tolerating new CPAP    Peripheral Edema:   Post Thrombotic Syndrome  I continue to suspect this is post thrombotic syndrome. Upper >> lower extremity. 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 Hollie Beach does not feel this is responsible for her edema.  She has had significant improvement with lymphedema therapy and will continue to do so.                                There are no Patient Instructions on file for this visit.      No follow-ups on file.   Answers for HPI/ROS submitted by the patient on 12/21/2019  What medical problem brings you in to see the provider?: Follow up from hospital stay and medication adjustment  Your symptom is...: recurrent  When did  they start?: more than 1 month ago  How often do you feel this symptom?: constantly  How has the symptom changed?: unchanged  On a scale of 0 to 10 (10 being the worst), how strong is your pain?: 7/10  How severe is your pain?: moderate  No appetite: No  Change in bowel habit: Yes  Swollen glands: No  Urinary symptoms: No  Vertigo: Yes  Visual change: No  Which of the following, if any, make(s) your symptom worse?: drinking, eating, exertion, stress  Which of the following treatments have you tried?: heat, ice, immobilization, lying down, rest, sleep, walking  If you've tried a treatment for this problem, how much relief did you experience?: moderate      Answers for HPI/ROS submitted by the patient on 08/30/2020  What medical problem brings you in to see the provider?: Depression, Numbness is feet, arms and hands  Your symptom is...: recurrent  When did they start?: 1 to 4 weeks ago  How often do you feel this symptom?: 2 to 4 times per day  How has the symptom changed?: unchanged  On a scale of 0 to 10 (10 being the worst), how strong is your pain?: 6/10  How severe is your pain?: moderate  No appetite: Yes  Change in bowel habit: No  Swollen glands: No  Urinary symptoms: No  Vertigo: No  Visual change: No  Which of the following, if any, make(s) your symptom worse?: bending, standing, stress, walking  Which of the following treatments have you tried?: acetaminophen, heat, ice, NSAIDs, position changes, rest, sleep, walking  If you've tried a treatment for this problem, how much relief did you experience?: no relief      Answers for HPI/ROS submitted by the patient on 11/21/2020  What medical problem brings you in to see the provider?: Test results and medicine check  Your symptom is...: recurrent  How has the symptom changed?: waxing and waning  On a scale of 0 to 10 (10 being the worst), how strong is your pain?: 7/10  How severe is your pain?: moderate  No appetite: Yes  Change in bowel habit: Yes  Swollen glands: Yes  Urinary symptoms: No  Vertigo: No  Visual change: Yes  Which of the following, if any, make(s) your symptom worse?: bending, exertion, intercourse, standing, stress, twisting, walking  Which of the following treatments have you tried?: acetaminophen, heat, ice, lying down, NSAIDs, oral narcotics, position changes, relaxation, rest, sleep, walking  If you've tried a treatment for this problem, how much relief did you experience?: mild      Answers submitted by the patient for this visit:  High Blood Pressure Questionnaire (Submitted on 05/10/2022)  Chief Complaint: Hypertension  Chronicity: recurrent  Onset: more than 1 year ago  Progression since onset: gradually worsening  Condition status: resistant  anxiety: Yes  blurred vision: No  malaise/fatigue: Yes  orthopnea: No  peripheral edema: No  PND: No  sweats: No  CAD risks: diabetes mellitus, family history, obesity, post-menopausal state, sedentary lifestyle, stress

## 2022-05-17 ENCOUNTER — Encounter: Admit: 2022-05-17 | Discharge: 2022-05-17 | Payer: BC Managed Care – PPO

## 2022-05-17 NOTE — Telephone Encounter
Mounjaro approved through 05/16/2025. Clayton Lefort, RN

## 2022-05-17 NOTE — Telephone Encounter
Prior authorization initiated on Mounjaro via [email or web address].       Key: [id number]    Clayton Lefort, RN

## 2022-05-18 ENCOUNTER — Encounter: Admit: 2022-05-18 | Discharge: 2022-05-18 | Payer: BC Managed Care – PPO

## 2022-05-18 MED FILL — DEXCOM G7 SENSOR MISC DEVI: 30 days supply | Qty: 3 | Fill #10 | Status: AC

## 2022-05-23 ENCOUNTER — Encounter: Admit: 2022-05-23 | Discharge: 2022-05-23 | Payer: BC Managed Care – PPO

## 2022-05-27 ENCOUNTER — Encounter: Admit: 2022-05-27 | Discharge: 2022-05-27 | Payer: BC Managed Care – PPO

## 2022-06-04 ENCOUNTER — Encounter: Admit: 2022-06-04 | Discharge: 2022-06-04 | Payer: BC Managed Care – PPO

## 2022-06-05 ENCOUNTER — Encounter: Admit: 2022-06-05 | Discharge: 2022-06-05 | Payer: BC Managed Care – PPO

## 2022-06-05 DIAGNOSIS — H9190 Unspecified hearing loss, unspecified ear: Secondary | ICD-10-CM

## 2022-06-05 DIAGNOSIS — R42 Dizziness and giddiness: Secondary | ICD-10-CM

## 2022-06-05 DIAGNOSIS — K639 Disease of intestine, unspecified: Secondary | ICD-10-CM

## 2022-06-05 DIAGNOSIS — R011 Cardiac murmur, unspecified: Secondary | ICD-10-CM

## 2022-06-05 DIAGNOSIS — K5732 Diverticulitis of large intestine without perforation or abscess without bleeding: Secondary | ICD-10-CM

## 2022-06-05 DIAGNOSIS — K859 Acute pancreatitis without necrosis or infection, unspecified: Secondary | ICD-10-CM

## 2022-06-05 DIAGNOSIS — I8290 Acute embolism and thrombosis of unspecified vein: Secondary | ICD-10-CM

## 2022-06-05 DIAGNOSIS — Z8619 Personal history of other infectious and parasitic diseases: Secondary | ICD-10-CM

## 2022-06-05 DIAGNOSIS — K589 Irritable bowel syndrome without diarrhea: Secondary | ICD-10-CM

## 2022-06-05 DIAGNOSIS — F32A Depression: Secondary | ICD-10-CM

## 2022-06-05 DIAGNOSIS — E119 Type 2 diabetes mellitus without complications: Secondary | ICD-10-CM

## 2022-06-05 DIAGNOSIS — I2699 Other pulmonary embolism without acute cor pulmonale: Secondary | ICD-10-CM

## 2022-06-05 DIAGNOSIS — R110 Nausea: Secondary | ICD-10-CM

## 2022-06-05 DIAGNOSIS — K5090 Crohn's disease, unspecified, without complications: Secondary | ICD-10-CM

## 2022-06-05 DIAGNOSIS — K56609 Unspecified intestinal obstruction, unspecified as to partial versus complete obstruction: Secondary | ICD-10-CM

## 2022-06-05 DIAGNOSIS — G43909 Migraine, unspecified, not intractable, without status migrainosus: Secondary | ICD-10-CM

## 2022-06-05 DIAGNOSIS — I82409 Acute embolism and thrombosis of unspecified deep veins of unspecified lower extremity: Secondary | ICD-10-CM

## 2022-06-05 DIAGNOSIS — M797 Fibromyalgia: Secondary | ICD-10-CM

## 2022-06-05 DIAGNOSIS — Z789 Other specified health status: Secondary | ICD-10-CM

## 2022-06-05 DIAGNOSIS — M2550 Pain in unspecified joint: Secondary | ICD-10-CM

## 2022-06-05 DIAGNOSIS — J984 Other disorders of lung: Secondary | ICD-10-CM

## 2022-06-05 DIAGNOSIS — J302 Other seasonal allergic rhinitis: Secondary | ICD-10-CM

## 2022-06-05 DIAGNOSIS — K5900 Constipation, unspecified: Secondary | ICD-10-CM

## 2022-06-05 DIAGNOSIS — K7581 Nonalcoholic steatohepatitis (NASH): Secondary | ICD-10-CM

## 2022-06-05 DIAGNOSIS — I6350 Cerebral infarction due to unspecified occlusion or stenosis of unspecified cerebral artery: Secondary | ICD-10-CM

## 2022-06-05 DIAGNOSIS — D4959 Neoplasm of unspecified behavior of other genitourinary organ: Secondary | ICD-10-CM

## 2022-06-05 DIAGNOSIS — N183 CKD (chronic kidney disease) stage 3, GFR 30-59 ml/min (HCC): Secondary | ICD-10-CM

## 2022-06-05 DIAGNOSIS — D699 Hemorrhagic condition, unspecified: Secondary | ICD-10-CM

## 2022-06-05 DIAGNOSIS — F419 Anxiety disorder, unspecified: Secondary | ICD-10-CM

## 2022-06-05 DIAGNOSIS — E139 Other specified diabetes mellitus without complications: Secondary | ICD-10-CM

## 2022-06-05 DIAGNOSIS — I10 Essential (primary) hypertension: Secondary | ICD-10-CM

## 2022-06-05 DIAGNOSIS — G40909 Epilepsy, unspecified, not intractable, without status epilepticus: Secondary | ICD-10-CM

## 2022-06-05 DIAGNOSIS — K219 Gastro-esophageal reflux disease without esophagitis: Secondary | ICD-10-CM

## 2022-06-05 DIAGNOSIS — IMO0002 Ulcer: Secondary | ICD-10-CM

## 2022-06-05 DIAGNOSIS — E785 Hyperlipidemia, unspecified: Secondary | ICD-10-CM

## 2022-06-06 ENCOUNTER — Encounter: Admit: 2022-06-06 | Discharge: 2022-06-06 | Payer: BC Managed Care – PPO

## 2022-06-07 ENCOUNTER — Ambulatory Visit: Admit: 2022-06-07 | Discharge: 2022-06-08 | Payer: BC Managed Care – PPO

## 2022-06-07 ENCOUNTER — Encounter: Admit: 2022-06-07 | Discharge: 2022-06-07 | Payer: BC Managed Care – PPO

## 2022-06-07 VITALS — BP 120/68 | HR 72 | Temp 97.00000°F | Resp 16 | Ht 64.0 in | Wt 216.9 lb

## 2022-06-07 VITALS — BP 123/70 | HR 74 | Temp 97.50000°F

## 2022-06-07 DIAGNOSIS — K50918 Crohn's disease, unspecified, with other complication: Principal | ICD-10-CM

## 2022-06-07 MED ORDER — DIPHENHYDRAMINE HCL 50 MG/ML IJ SOLN
25 mg | Freq: Once | INTRAVENOUS | 0 refills | Status: CP | PRN
Start: 2022-06-07 — End: ?

## 2022-06-07 MED ORDER — DIPHENHYDRAMINE HCL 50 MG/ML IJ SOLN
25 mg | Freq: Once | INTRAVENOUS | 0 refills | Status: CP
Start: 2022-06-07 — End: ?
  Administered 2022-06-07: 15:00:00 25 mg via INTRAVENOUS

## 2022-06-07 MED ORDER — VEDOLIZUMAB IVPB
300 mg | Freq: Once | INTRAVENOUS | 0 refills
Start: 2022-06-07 — End: ?

## 2022-06-07 MED ORDER — VEDOLIZUMAB IVPB
300 mg | Freq: Once | INTRAVENOUS | 0 refills | Status: CP
Start: 2022-06-07 — End: ?
  Administered 2022-06-07 (×2): 300 mg via INTRAVENOUS

## 2022-06-07 MED ORDER — ACETAMINOPHEN 500 MG PO TAB
500 mg | Freq: Once | ORAL | 0 refills
Start: 2022-06-07 — End: ?

## 2022-06-07 MED ORDER — DIPHENHYDRAMINE HCL 50 MG/ML IJ SOLN
25 mg | Freq: Once | INTRAVENOUS | 0 refills
Start: 2022-06-07 — End: ?

## 2022-06-07 MED ORDER — ACETAMINOPHEN 500 MG PO TAB
500 mg | Freq: Once | ORAL | 0 refills | Status: CP
Start: 2022-06-07 — End: ?
  Administered 2022-06-07: 14:00:00 500 mg via ORAL

## 2022-06-07 MED ADMIN — DIPHENHYDRAMINE HCL 50 MG/ML IJ SOLN [2508]: 25 mg | INTRAVENOUS | @ 15:00:00 | Stop: 2022-06-07 | NDC 00641037621

## 2022-06-07 NOTE — Progress Notes
Patient presents to infusion clinic for vedolizumab infusion.  Patient denies recent concern for infection, fever, or antibiotic use.  Pre-infusion vital signs completed. Peripheral IV started.  Unable to obtain labs with PIV stick, lab personnel to bedside and successfully drew labs.    [id number]: Pre-medications administered, see e-mar for details.    [id number]: Infusion started, see e-mar for administration details  ~0915: PRN dose of diphenhydramine given. See emar for details.     ~0940: Infusion completed and flushed. See e-mar for administration details and Post infusion vitals completed. No concern for infusion reaction. Patient has no complaints. PIV discontinued. Patient declined printed AVS. Prompted patient to schedule upcoming appointment(s) at infusion checkout or call infusion scheduling. Patient verbalized understanding. Patient ambulated off unit.     Vitals:    06/07/22 0810 06/07/22 0937   BP: 120/68 123/70   BP Source: Arm, Right Upper Arm, Right Upper   Pulse: 72 74   Temp: 36.1 C (97 F) 36.4 C (97.5 F)   Resp: 16    SpO2: 98% 99%   O2 Device: None (Room air)    TempSrc: Skin    Weight: 98.4 kg (216 lb 14.4 oz)    Height: 162.6 cm (5' 4"$ )

## 2022-06-12 ENCOUNTER — Ambulatory Visit: Admit: 2022-06-12 | Discharge: 2022-06-12 | Payer: BC Managed Care – PPO

## 2022-06-12 ENCOUNTER — Encounter: Admit: 2022-06-12 | Discharge: 2022-06-12 | Payer: BC Managed Care – PPO

## 2022-06-12 DIAGNOSIS — E785 Hyperlipidemia, unspecified: Secondary | ICD-10-CM

## 2022-06-12 DIAGNOSIS — I8290 Acute embolism and thrombosis of unspecified vein: Secondary | ICD-10-CM

## 2022-06-12 DIAGNOSIS — K5090 Crohn's disease, unspecified, without complications: Secondary | ICD-10-CM

## 2022-06-12 DIAGNOSIS — I82409 Acute embolism and thrombosis of unspecified deep veins of unspecified lower extremity: Secondary | ICD-10-CM

## 2022-06-12 DIAGNOSIS — K56609 Unspecified intestinal obstruction, unspecified as to partial versus complete obstruction: Secondary | ICD-10-CM

## 2022-06-12 DIAGNOSIS — K589 Irritable bowel syndrome without diarrhea: Secondary | ICD-10-CM

## 2022-06-12 DIAGNOSIS — E139 Other specified diabetes mellitus without complications: Secondary | ICD-10-CM

## 2022-06-12 DIAGNOSIS — G40909 Epilepsy, unspecified, not intractable, without status epilepticus: Secondary | ICD-10-CM

## 2022-06-12 DIAGNOSIS — I6350 Cerebral infarction due to unspecified occlusion or stenosis of unspecified cerebral artery: Secondary | ICD-10-CM

## 2022-06-12 DIAGNOSIS — Z8619 Personal history of other infectious and parasitic diseases: Secondary | ICD-10-CM

## 2022-06-12 DIAGNOSIS — K7581 Nonalcoholic steatohepatitis (NASH): Secondary | ICD-10-CM

## 2022-06-12 DIAGNOSIS — E559 Vitamin D deficiency, unspecified: Secondary | ICD-10-CM

## 2022-06-12 DIAGNOSIS — D699 Hemorrhagic condition, unspecified: Secondary | ICD-10-CM

## 2022-06-12 DIAGNOSIS — K639 Disease of intestine, unspecified: Secondary | ICD-10-CM

## 2022-06-12 DIAGNOSIS — IMO0002 Ulcer: Secondary | ICD-10-CM

## 2022-06-12 DIAGNOSIS — E119 Type 2 diabetes mellitus without complications: Secondary | ICD-10-CM

## 2022-06-12 DIAGNOSIS — H9190 Unspecified hearing loss, unspecified ear: Secondary | ICD-10-CM

## 2022-06-12 DIAGNOSIS — M2550 Pain in unspecified joint: Secondary | ICD-10-CM

## 2022-06-12 DIAGNOSIS — N183 CKD (chronic kidney disease) stage 3, GFR 30-59 ml/min (HCC): Secondary | ICD-10-CM

## 2022-06-12 DIAGNOSIS — E538 Deficiency of other specified B group vitamins: Secondary | ICD-10-CM

## 2022-06-12 DIAGNOSIS — I10 Essential (primary) hypertension: Secondary | ICD-10-CM

## 2022-06-12 DIAGNOSIS — N179 Acute kidney failure, unspecified: Secondary | ICD-10-CM

## 2022-06-12 DIAGNOSIS — F419 Anxiety disorder, unspecified: Secondary | ICD-10-CM

## 2022-06-12 DIAGNOSIS — Z789 Other specified health status: Secondary | ICD-10-CM

## 2022-06-12 DIAGNOSIS — K5900 Constipation, unspecified: Secondary | ICD-10-CM

## 2022-06-12 DIAGNOSIS — K50918 Crohn's disease, unspecified, with other complication: Secondary | ICD-10-CM

## 2022-06-12 DIAGNOSIS — R011 Cardiac murmur, unspecified: Secondary | ICD-10-CM

## 2022-06-12 DIAGNOSIS — I2699 Other pulmonary embolism without acute cor pulmonale: Secondary | ICD-10-CM

## 2022-06-12 DIAGNOSIS — J984 Other disorders of lung: Secondary | ICD-10-CM

## 2022-06-12 DIAGNOSIS — R42 Dizziness and giddiness: Secondary | ICD-10-CM

## 2022-06-12 DIAGNOSIS — F32A Depression: Secondary | ICD-10-CM

## 2022-06-12 DIAGNOSIS — M797 Fibromyalgia: Secondary | ICD-10-CM

## 2022-06-12 DIAGNOSIS — K5732 Diverticulitis of large intestine without perforation or abscess without bleeding: Secondary | ICD-10-CM

## 2022-06-12 DIAGNOSIS — K859 Acute pancreatitis without necrosis or infection, unspecified: Secondary | ICD-10-CM

## 2022-06-12 DIAGNOSIS — G43909 Migraine, unspecified, not intractable, without status migrainosus: Secondary | ICD-10-CM

## 2022-06-12 DIAGNOSIS — Z1231 Encounter for screening mammogram for malignant neoplasm of breast: Secondary | ICD-10-CM

## 2022-06-12 DIAGNOSIS — K219 Gastro-esophageal reflux disease without esophagitis: Secondary | ICD-10-CM

## 2022-06-12 DIAGNOSIS — R110 Nausea: Secondary | ICD-10-CM

## 2022-06-12 DIAGNOSIS — J302 Other seasonal allergic rhinitis: Secondary | ICD-10-CM

## 2022-06-12 DIAGNOSIS — N1831 Stage 3a chronic kidney disease (HCC): Secondary | ICD-10-CM

## 2022-06-12 DIAGNOSIS — D4959 Neoplasm of unspecified behavior of other genitourinary organ: Secondary | ICD-10-CM

## 2022-06-12 DIAGNOSIS — F410 Panic disorder [episodic paroxysmal anxiety] without agoraphobia: Secondary | ICD-10-CM

## 2022-06-12 DIAGNOSIS — Z0000 Encounter for general adult medical examination without abnormal findings: Secondary | ICD-10-CM

## 2022-06-12 DIAGNOSIS — Z23 Encounter for immunization: Secondary | ICD-10-CM

## 2022-06-12 LAB — MICROALB/CR RATIO-URINE RANDOM
MICROALBUMIN, RAN: 20 ug/mL
MICROALBUMIN/CR RATIO URINE: 15 ug/mg (ref <30)
UR CREATININE, RAN: 135 mg/dL

## 2022-06-12 LAB — LIPID PROFILE
CHOLESTEROL: 121 mg/dL (ref <200)
TRIGLYCERIDES: 162 mg/dL — ABNORMAL HIGH (ref <150)

## 2022-06-12 LAB — VITAMIN B12: VITAMIN B12: 341 pg/mL (ref 180–914)

## 2022-06-12 LAB — BASIC METABOLIC PANEL: SODIUM: 137 MMOL/L (ref 137–147)

## 2022-06-12 LAB — 25-OH VITAMIN D (D2 + D3): VITAMIN D (25-OH) TOTAL: 18 ng/mL — ABNORMAL LOW (ref 30–80)

## 2022-06-12 LAB — TSH WITH FREE T4 REFLEX: TSH: 2.2 uU/mL (ref 0.35–5.00)

## 2022-06-12 LAB — HEMOGLOBIN A1C: HEMOGLOBIN A1C: 6.2 % — ABNORMAL HIGH (ref 4.0–5.7)

## 2022-06-12 MED ORDER — AMLODIPINE 5 MG PO TAB
5 mg | ORAL_TABLET | Freq: Every day | ORAL | 3 refills | Status: AC
Start: 2022-06-12 — End: ?

## 2022-06-12 MED ORDER — HYDROXYZINE HCL 25 MG PO TAB
25 mg | ORAL_TABLET | Freq: Three times a day (TID) | ORAL | 3 refills | 30.00000 days | Status: AC
Start: 2022-06-12 — End: ?

## 2022-06-12 NOTE — Patient Instructions
Lets add amlodipine for now.   OK to increase to '10mg'$ s in '5mg'$  in one week does not have you at goal.

## 2022-06-15 ENCOUNTER — Encounter: Admit: 2022-06-15 | Discharge: 2022-06-15 | Payer: BC Managed Care – PPO

## 2022-06-16 ENCOUNTER — Encounter: Admit: 2022-06-16 | Discharge: 2022-06-16 | Payer: BC Managed Care – PPO

## 2022-06-21 ENCOUNTER — Encounter: Admit: 2022-06-21 | Discharge: 2022-06-21 | Payer: BC Managed Care – PPO

## 2022-06-23 ENCOUNTER — Encounter: Admit: 2022-06-23 | Discharge: 2022-06-23 | Payer: BC Managed Care – PPO

## 2022-06-24 ENCOUNTER — Encounter: Admit: 2022-06-24 | Discharge: 2022-06-24 | Payer: BC Managed Care – PPO

## 2022-06-24 MED FILL — DEXCOM G7 SENSOR MISC DEVI: 30 days supply | Qty: 3 | Fill #11 | Status: AC

## 2022-06-28 ENCOUNTER — Encounter: Admit: 2022-06-28 | Discharge: 2022-06-28 | Payer: BC Managed Care – PPO

## 2022-06-28 ENCOUNTER — Ambulatory Visit: Admit: 2022-06-28 | Discharge: 2022-06-28 | Payer: BC Managed Care – PPO

## 2022-06-28 DIAGNOSIS — K219 Gastro-esophageal reflux disease without esophagitis: Secondary | ICD-10-CM

## 2022-06-28 DIAGNOSIS — I2699 Other pulmonary embolism without acute cor pulmonale: Secondary | ICD-10-CM

## 2022-06-28 DIAGNOSIS — D4959 Neoplasm of unspecified behavior of other genitourinary organ: Secondary | ICD-10-CM

## 2022-06-28 DIAGNOSIS — M2550 Pain in unspecified joint: Secondary | ICD-10-CM

## 2022-06-28 DIAGNOSIS — Z789 Other specified health status: Secondary | ICD-10-CM

## 2022-06-28 DIAGNOSIS — E785 Hyperlipidemia, unspecified: Secondary | ICD-10-CM

## 2022-06-28 DIAGNOSIS — K859 Acute pancreatitis without necrosis or infection, unspecified: Secondary | ICD-10-CM

## 2022-06-28 DIAGNOSIS — K5900 Constipation, unspecified: Secondary | ICD-10-CM

## 2022-06-28 DIAGNOSIS — I6350 Cerebral infarction due to unspecified occlusion or stenosis of unspecified cerebral artery: Secondary | ICD-10-CM

## 2022-06-28 DIAGNOSIS — H9190 Unspecified hearing loss, unspecified ear: Secondary | ICD-10-CM

## 2022-06-28 DIAGNOSIS — IMO0002 Ulcer: Secondary | ICD-10-CM

## 2022-06-28 DIAGNOSIS — R109 Unspecified abdominal pain: Secondary | ICD-10-CM

## 2022-06-28 DIAGNOSIS — K589 Irritable bowel syndrome without diarrhea: Secondary | ICD-10-CM

## 2022-06-28 DIAGNOSIS — M797 Fibromyalgia: Secondary | ICD-10-CM

## 2022-06-28 DIAGNOSIS — E119 Type 2 diabetes mellitus without complications: Secondary | ICD-10-CM

## 2022-06-28 DIAGNOSIS — I8290 Acute embolism and thrombosis of unspecified vein: Secondary | ICD-10-CM

## 2022-06-28 DIAGNOSIS — K7581 Nonalcoholic steatohepatitis (NASH): Secondary | ICD-10-CM

## 2022-06-28 DIAGNOSIS — R42 Dizziness and giddiness: Secondary | ICD-10-CM

## 2022-06-28 DIAGNOSIS — K5732 Diverticulitis of large intestine without perforation or abscess without bleeding: Secondary | ICD-10-CM

## 2022-06-28 DIAGNOSIS — I82409 Acute embolism and thrombosis of unspecified deep veins of unspecified lower extremity: Secondary | ICD-10-CM

## 2022-06-28 DIAGNOSIS — G8928 Other chronic postprocedural pain: Secondary | ICD-10-CM

## 2022-06-28 DIAGNOSIS — R011 Cardiac murmur, unspecified: Secondary | ICD-10-CM

## 2022-06-28 DIAGNOSIS — I10 Essential (primary) hypertension: Secondary | ICD-10-CM

## 2022-06-28 DIAGNOSIS — J984 Other disorders of lung: Secondary | ICD-10-CM

## 2022-06-28 DIAGNOSIS — F32A Depression: Secondary | ICD-10-CM

## 2022-06-28 DIAGNOSIS — N183 CKD (chronic kidney disease) stage 3, GFR 30-59 ml/min (HCC): Secondary | ICD-10-CM

## 2022-06-28 DIAGNOSIS — E139 Other specified diabetes mellitus without complications: Secondary | ICD-10-CM

## 2022-06-28 DIAGNOSIS — F419 Anxiety disorder, unspecified: Secondary | ICD-10-CM

## 2022-06-28 DIAGNOSIS — K56609 Unspecified intestinal obstruction, unspecified as to partial versus complete obstruction: Secondary | ICD-10-CM

## 2022-06-28 DIAGNOSIS — Z1231 Encounter for screening mammogram for malignant neoplasm of breast: Secondary | ICD-10-CM

## 2022-06-28 DIAGNOSIS — G40909 Epilepsy, unspecified, not intractable, without status epilepticus: Secondary | ICD-10-CM

## 2022-06-28 DIAGNOSIS — K50918 Crohn's disease, unspecified, with other complication: Secondary | ICD-10-CM

## 2022-06-28 DIAGNOSIS — D699 Hemorrhagic condition, unspecified: Secondary | ICD-10-CM

## 2022-06-28 DIAGNOSIS — K5090 Crohn's disease, unspecified, without complications: Secondary | ICD-10-CM

## 2022-06-28 DIAGNOSIS — K639 Disease of intestine, unspecified: Secondary | ICD-10-CM

## 2022-06-28 DIAGNOSIS — G43909 Migraine, unspecified, not intractable, without status migrainosus: Secondary | ICD-10-CM

## 2022-06-28 DIAGNOSIS — R110 Nausea: Secondary | ICD-10-CM

## 2022-06-28 DIAGNOSIS — J302 Other seasonal allergic rhinitis: Secondary | ICD-10-CM

## 2022-06-28 DIAGNOSIS — Z8619 Personal history of other infectious and parasitic diseases: Secondary | ICD-10-CM

## 2022-06-28 MED ORDER — BUPIVACAINE (PF) 0.5 % (5 MG/ML) IJ SOLN
5 mL | Freq: Once | INTRAMUSCULAR | 0 refills | Status: CP | PRN
Start: 2022-06-28 — End: ?

## 2022-06-28 MED ORDER — LIDOCAINE (PF) 10 MG/ML (1 %) IJ SOLN
1 mL | Freq: Once | INTRAMUSCULAR | 0 refills | Status: CP | PRN
Start: 2022-06-28 — End: ?

## 2022-06-28 MED ORDER — TRIAMCINOLONE ACETONIDE 40 MG/ML IJ SUSP
20 mg | Freq: Once | INTRAMUSCULAR | 0 refills | Status: CP | PRN
Start: 2022-06-28 — End: ?

## 2022-06-28 NOTE — Procedures
Attending Surgeon: Chelsea Primus, MD    Anesthesia: Local    Pre-Procedure Diagnosis:   1. Chronic abdominal pain    2. Crohn's disease with other complication, unspecified gastrointestinal tract location (HCC)    3. Chronic post-operative pain        Post-Procedure Diagnosis:   1. Chronic abdominal pain    2. Crohn's disease with other complication, unspecified gastrointestinal tract location (HCC)    3. Chronic post-operative pain        [facility]  Nerve: Abdominal cutaneous (TAP)  Laterality: bilateral   on 06/28/2022 2:00 PM    Consent:   Consent obtained: written  Consent given by: patient  Alternatives discussed: alternative treatment, delayed treatment, no treatment and referral  Discussed with patient the purpose of the treatment/procedure, other ways of treating my condition, including no treatment/ procedure and the risks and benefits of the alternatives. Patient has decided to proceed with treatment/procedure.        Universal Protocol:  Relevant documents: relevant documents present and verified  Test results: test results available and properly labeled  Imaging studies: imaging studies available  Required items: required blood products, implants, devices, and special equipment available  Site marked: the operative site was marked  Patient identity confirmed: Patient identify confirmed verbally with patient.        Time out: Immediately prior to procedure a time out was called to verify the correct patient, procedure, equipment, support staff and site/side marked as required        Procedures Details:   Indications: Pain Relief  Preparation: Patient was prepped and draped in the usual sterile fashion.  Prep: 2% chlorhexidine  Patient position: supine  Needle size: 22 G  Guidance: ultrasound  Justification for use of ultrasound guidance: The use of direct sonographic visualization of the needle (rather than a non-guided injection) was required to ensure accurate injection placement for diagnostic specificity, to maximize clinical efficacy and for safety purposes to minimize risk of bleeding or injury to nearby neurovascular structures.  Right Medication Administered - 1 mL lidocaine PF 1% (10 mg/mL); 5 mL bupivacaine PF 0.5 %; 20 mg triamcinolone acetonide 40 mg/mL  Medication Administered - 1 mL lidocaine PF 1% (10 mg/mL); 5 mL bupivacaine PF 0.5 %; 20 mg triamcinolone acetonide 40 mg/mL  Patient tolerance: tolerated well, no immediate complications  Comments: PROCEDURE: bilateral Transverse Abdominis Plane Block (TAP) with Ultrasound needle guidance    PREPROCEDURE DIAGNOSES: Abdominal pain    POSTPROCEDURE DIAGNOSES: Abdominal pain    SURGEON: Chelsea Primus, MD    MEDICATION INJECTED: As above    ANESTHESIA: None    ESTIMATED BLOOD LOSS: None    COMPLICATIONS: None    TECHNIQUE: A time-out was taken to identify the correct patient, procedure, and site prior to starting the procedure. A consent was signed and placed in the chart. Lying in a supine position, the patient was prepped in sterile fashion using chlorhexadine. An ultrasound probe was placed on the bilateral upper quadrant of the abdomen and the plane between the internal oblique and transverse abdominis was identified. A 22-gauge, 3.5-inch spinal needle was advanced to this plane under live ultrasound guidance. Following negative aspiration initially and intermittently, the solution was injected with excellent spread in the desired plane.     The procedure was completed without complications and was tolerated well. The patient was monitored after the procedure. The patient was given postprocedure and discharge instructions to follow at home. The patient was discharged in  stable condition.         Estimated blood loss: none or minimal  Specimens: none  Patient tolerated the procedure well with no immediate complications. Pressure was applied, and hemostasis was accomplished.

## 2022-06-29 ENCOUNTER — Encounter: Admit: 2022-06-29 | Discharge: 2022-06-29 | Payer: BC Managed Care – PPO

## 2022-06-29 ENCOUNTER — Ambulatory Visit: Admit: 2022-06-29 | Discharge: 2022-06-29 | Payer: BC Managed Care – PPO

## 2022-06-29 DIAGNOSIS — E139 Other specified diabetes mellitus without complications: Secondary | ICD-10-CM

## 2022-06-29 DIAGNOSIS — K219 Gastro-esophageal reflux disease without esophagitis: Secondary | ICD-10-CM

## 2022-06-29 DIAGNOSIS — F419 Anxiety disorder, unspecified: Secondary | ICD-10-CM

## 2022-06-29 DIAGNOSIS — J302 Other seasonal allergic rhinitis: Secondary | ICD-10-CM

## 2022-06-29 DIAGNOSIS — F32A Depression: Secondary | ICD-10-CM

## 2022-06-29 DIAGNOSIS — Z8619 Personal history of other infectious and parasitic diseases: Secondary | ICD-10-CM

## 2022-06-29 DIAGNOSIS — D4959 Neoplasm of unspecified behavior of other genitourinary organ: Secondary | ICD-10-CM

## 2022-06-29 DIAGNOSIS — K589 Irritable bowel syndrome without diarrhea: Secondary | ICD-10-CM

## 2022-06-29 DIAGNOSIS — H9190 Unspecified hearing loss, unspecified ear: Secondary | ICD-10-CM

## 2022-06-29 DIAGNOSIS — K5900 Constipation, unspecified: Secondary | ICD-10-CM

## 2022-06-29 DIAGNOSIS — M797 Fibromyalgia: Secondary | ICD-10-CM

## 2022-06-29 DIAGNOSIS — IMO0002 Ulcer: Secondary | ICD-10-CM

## 2022-06-29 DIAGNOSIS — R011 Cardiac murmur, unspecified: Secondary | ICD-10-CM

## 2022-06-29 DIAGNOSIS — E785 Hyperlipidemia, unspecified: Secondary | ICD-10-CM

## 2022-06-29 DIAGNOSIS — G43909 Migraine, unspecified, not intractable, without status migrainosus: Secondary | ICD-10-CM

## 2022-06-29 DIAGNOSIS — E119 Type 2 diabetes mellitus without complications: Secondary | ICD-10-CM

## 2022-06-29 DIAGNOSIS — K859 Acute pancreatitis without necrosis or infection, unspecified: Secondary | ICD-10-CM

## 2022-06-29 DIAGNOSIS — K56609 Unspecified intestinal obstruction, unspecified as to partial versus complete obstruction: Secondary | ICD-10-CM

## 2022-06-29 DIAGNOSIS — M2550 Pain in unspecified joint: Secondary | ICD-10-CM

## 2022-06-29 DIAGNOSIS — J984 Other disorders of lung: Secondary | ICD-10-CM

## 2022-06-29 DIAGNOSIS — I82409 Acute embolism and thrombosis of unspecified deep veins of unspecified lower extremity: Secondary | ICD-10-CM

## 2022-06-29 DIAGNOSIS — Z789 Other specified health status: Secondary | ICD-10-CM

## 2022-06-29 DIAGNOSIS — N183 CKD (chronic kidney disease) stage 3, GFR 30-59 ml/min (HCC): Secondary | ICD-10-CM

## 2022-06-29 DIAGNOSIS — K7581 Nonalcoholic steatohepatitis (NASH): Secondary | ICD-10-CM

## 2022-06-29 DIAGNOSIS — I2699 Other pulmonary embolism without acute cor pulmonale: Secondary | ICD-10-CM

## 2022-06-29 DIAGNOSIS — I8290 Acute embolism and thrombosis of unspecified vein: Secondary | ICD-10-CM

## 2022-06-29 DIAGNOSIS — R9389 Abnormal findings on diagnostic imaging of other specified body structures: Secondary | ICD-10-CM

## 2022-06-29 DIAGNOSIS — K639 Disease of intestine, unspecified: Secondary | ICD-10-CM

## 2022-06-29 DIAGNOSIS — K5732 Diverticulitis of large intestine without perforation or abscess without bleeding: Secondary | ICD-10-CM

## 2022-06-29 DIAGNOSIS — R42 Dizziness and giddiness: Secondary | ICD-10-CM

## 2022-06-29 DIAGNOSIS — I6350 Cerebral infarction due to unspecified occlusion or stenosis of unspecified cerebral artery: Secondary | ICD-10-CM

## 2022-06-29 DIAGNOSIS — K5090 Crohn's disease, unspecified, without complications: Secondary | ICD-10-CM

## 2022-06-29 DIAGNOSIS — I10 Essential (primary) hypertension: Secondary | ICD-10-CM

## 2022-06-29 DIAGNOSIS — R110 Nausea: Secondary | ICD-10-CM

## 2022-06-29 DIAGNOSIS — D699 Hemorrhagic condition, unspecified: Secondary | ICD-10-CM

## 2022-06-29 DIAGNOSIS — G40909 Epilepsy, unspecified, not intractable, without status epilepticus: Secondary | ICD-10-CM

## 2022-07-02 ENCOUNTER — Encounter: Admit: 2022-07-02 | Discharge: 2022-07-02 | Payer: BC Managed Care – PPO

## 2022-07-02 DIAGNOSIS — R9389 Abnormal findings on diagnostic imaging of other specified body structures: Secondary | ICD-10-CM

## 2022-07-02 MED ORDER — ALBUTEROL SULFATE 90 MCG/ACTUATION IN HFAA
2 | RESPIRATORY_TRACT | 11 refills | Status: AC | PRN
Start: 2022-07-02 — End: ?

## 2022-07-17 ENCOUNTER — Encounter: Admit: 2022-07-17 | Discharge: 2022-07-17 | Payer: BC Managed Care – PPO

## 2022-07-20 ENCOUNTER — Encounter: Admit: 2022-07-20 | Discharge: 2022-07-20 | Payer: BC Managed Care – PPO

## 2022-07-21 ENCOUNTER — Encounter: Admit: 2022-07-21 | Discharge: 2022-07-21 | Payer: BC Managed Care – PPO

## 2022-07-21 MED FILL — DEXCOM G7 SENSOR MISC DEVI: 30 days supply | Qty: 3 | Fill #12 | Status: AC

## 2022-07-27 ENCOUNTER — Encounter: Admit: 2022-07-27 | Discharge: 2022-07-27 | Payer: BC Managed Care – PPO

## 2022-07-27 DIAGNOSIS — F324 Major depressive disorder, single episode, in partial remission: Secondary | ICD-10-CM

## 2022-07-27 DIAGNOSIS — E782 Mixed hyperlipidemia: Secondary | ICD-10-CM

## 2022-07-27 MED ORDER — BUPROPION XL 300 MG PO TB24
300 mg | ORAL_TABLET | Freq: Every morning | ORAL | 1 refills | Status: AC
Start: 2022-07-27 — End: ?

## 2022-07-27 MED ORDER — ATORVASTATIN 40 MG PO TAB
40 mg | ORAL_TABLET | Freq: Every day | ORAL | 1 refills | Status: AC
Start: 2022-07-27 — End: ?

## 2022-07-27 MED ORDER — CYCLOBENZAPRINE 10 MG PO TAB
10 mg | ORAL_TABLET | Freq: Every evening | ORAL | 0 refills
Start: 2022-07-27 — End: ?

## 2022-07-27 NOTE — Telephone Encounter
Some refill protocol elements NOT Met  Medication name: Flexeril  Medication Strength: 10mg  tablet      Off protocol   This refill cannot be delegated     Next office visit: 08/28/22.    Routed to Provider    Shelly Bombard, RN

## 2022-08-02 ENCOUNTER — Encounter: Admit: 2022-08-02 | Discharge: 2022-08-02 | Payer: BC Managed Care – PPO

## 2022-08-02 ENCOUNTER — Ambulatory Visit: Admit: 2022-08-02 | Discharge: 2022-08-03 | Payer: BC Managed Care – PPO

## 2022-08-02 DIAGNOSIS — K50918 Crohn's disease, unspecified, with other complication: Secondary | ICD-10-CM

## 2022-08-02 MED ORDER — ACETAMINOPHEN 500 MG PO TAB
500 mg | Freq: Once | ORAL | 0 refills | Status: CP
Start: 2022-08-02 — End: ?
  Administered 2022-08-02: 18:00:00 500 mg via ORAL

## 2022-08-02 MED ORDER — DIPHENHYDRAMINE HCL 50 MG/ML IJ SOLN
25 mg | Freq: Once | INTRAVENOUS | 0 refills | Status: CP | PRN
Start: 2022-08-02 — End: ?
  Administered 2022-08-02: 19:00:00 25 mg via INTRAVENOUS

## 2022-08-02 MED ORDER — VEDOLIZUMAB IVPB
300 mg | Freq: Once | INTRAVENOUS | 0 refills
Start: 2022-08-02 — End: ?

## 2022-08-02 MED ORDER — ACETAMINOPHEN 500 MG PO TAB
500 mg | Freq: Once | ORAL | 0 refills
Start: 2022-08-02 — End: ?

## 2022-08-02 MED ORDER — VEDOLIZUMAB IVPB
300 mg | Freq: Once | INTRAVENOUS | 0 refills | Status: CP
Start: 2022-08-02 — End: ?
  Administered 2022-08-02 (×2): 300 mg via INTRAVENOUS

## 2022-08-02 MED ORDER — DIPHENHYDRAMINE HCL 50 MG/ML IJ SOLN
25 mg | Freq: Once | INTRAVENOUS | 0 refills | Status: CP
Start: 2022-08-02 — End: ?
  Administered 2022-08-02: 18:00:00 25 mg via INTRAVENOUS

## 2022-08-02 MED ORDER — DIPHENHYDRAMINE HCL 50 MG/ML IJ SOLN
25 mg | Freq: Once | INTRAVENOUS | 0 refills
Start: 2022-08-02 — End: ?

## 2022-08-02 NOTE — Progress Notes
Patient tolerated Entyvio infusion; no reaction noted.

## 2022-08-07 ENCOUNTER — Encounter: Admit: 2022-08-07 | Discharge: 2022-08-07 | Payer: BC Managed Care – PPO

## 2022-08-07 ENCOUNTER — Ambulatory Visit: Admit: 2022-08-07 | Discharge: 2022-08-08 | Payer: BC Managed Care – PPO

## 2022-08-07 DIAGNOSIS — J479 Bronchiectasis, uncomplicated: Secondary | ICD-10-CM

## 2022-08-07 DIAGNOSIS — R918 Other nonspecific abnormal finding of lung field: Secondary | ICD-10-CM

## 2022-08-07 DIAGNOSIS — E1122 Type 2 diabetes mellitus with diabetic chronic kidney disease: Secondary | ICD-10-CM

## 2022-08-07 DIAGNOSIS — G4733 Obstructive sleep apnea (adult) (pediatric): Secondary | ICD-10-CM

## 2022-08-08 ENCOUNTER — Ambulatory Visit: Admit: 2022-08-08 | Discharge: 2022-08-09 | Payer: BC Managed Care – PPO

## 2022-08-08 ENCOUNTER — Encounter: Admit: 2022-08-08 | Discharge: 2022-08-08 | Payer: BC Managed Care – PPO

## 2022-08-08 DIAGNOSIS — I10 Essential (primary) hypertension: Secondary | ICD-10-CM

## 2022-08-08 DIAGNOSIS — Z6836 Body mass index (BMI) 36.0-36.9, adult: Secondary | ICD-10-CM

## 2022-08-08 DIAGNOSIS — E1122 Type 2 diabetes mellitus with diabetic chronic kidney disease: Secondary | ICD-10-CM

## 2022-08-08 MED ORDER — INSULIN DEGLUDEC 200 UNIT/ML (3 ML) SC INPN
50 [IU] | Freq: Every day | SUBCUTANEOUS | 3 refills | 30.00000 days | Status: AC
Start: 2022-08-08 — End: ?

## 2022-08-08 MED ORDER — DEXCOM G7 SENSOR MISC DEVI
1 | 3 refills | Status: AC
Start: 2022-08-08 — End: ?
  Filled 2022-08-10: qty 3, 30d supply, fill #1

## 2022-08-08 MED ORDER — MOUNJARO 2.5 MG/0.5 ML SC PNIJ
2.5 mg | SUBCUTANEOUS | 3 refills | Status: AC
Start: 2022-08-08 — End: ?

## 2022-08-08 MED ORDER — INSULIN GLARGINE U-300 CONC 300 UNIT/ML (1.5 ML) SC INPN
50 [IU] | Freq: Every day | SUBCUTANEOUS | 3 refills | 68.00000 days | Status: DC
Start: 2022-08-08 — End: 2022-08-08

## 2022-08-08 NOTE — Progress Notes
Subjective:       Diabetes      Tina Snyder is a 57 y.o. female who was seen today as a follow up for type 2 DM.     Interval changes:   Blood sugar control has been excellent-- she has been able to almost fully stop the novolog. She has only been using ~ 8 units for higher carbohydrate meals intermittently.   She started on the mounjaro with Dr. Annia Belt after a lengthy risk/benefit conversation due to her history of chronic pancreatitis and Crohn's, and she has tolerated this quite well. She has quite a bit of appetite reduction even just on the 2.5 mg dose and has been steadily losing weight (about 12 lbs).   Was not feeling well after last entyvio-- appetite was a lot lower.   Neuropathy is feeling like an electric current lately. Massage was helpful.   Stopped farxiga due to yeast infections.     Summary of Glucose Control:    Lab Results   Component Value Date    A1C 6.2 (A) 02/13/2022    A1C 6.6 (H) 08/23/2021    GLUPOC 101 (A) 02/13/2022     Dexcom download:          -----------------------------  Dexcom Clarity  -----------------------------  Tina Snyder  Date of Birth: 08/14/1965  Generated at: Thu, Aug 08, 2022 9:18 AM CDT  Reporting period: Fri Jul 26, 2022 - Thu Aug 08, 2022  -----------------------------  Glucose Details  Average glucose: 121 mg/dL  Standard deviation: 29 mg/dL  GMI: 1.6%  -----------------------------  Time in Range  Very High: <1%  High: 3%  In Range: 95%  Low: 1%  Very Low: <1%    Target Range  70-180 mg/dL    -----------------------------  CGM Details  Sensor usage: 93%  Days with CGM data: 13/14      Trends:   - Excellent glycemic control      Type 2 DM:   Dx: 2012  Current regimen:  Toujeo 52 units daily, Mounjaro 2.5 mg weekly    Frequency of hypoglycemia infrequent    How many meals per day? 3 meals with snack    Weight changes: none--trying to lose weight   Exercise? no   Lipid: Lipitor 40 mg daily    Eye exam: today   Foot exam: March 2023         Family History of diabetes: none, son with celiac  Social History: lives with her husband, Bovey, in [place]. Does not drink or smoke.       Medical History:   Diagnosis Date    Anxiety     Bleeding tendency (HCC)     Blood clot in vein     subclavian    Bowel disease     Cerebral artery occlusion with cerebral infarction (HCC)     CKD (chronic kidney disease) stage 3, GFR 30-59 ml/min (HCC)     Constipation     Crohn disease (HCC)     Deep vein thrombosis (DVT) (HCC)     Depression     Difficult intravenous access     Diverticulitis of colon (without mention of hemorrhage)(562.11)     Dizziness     DM (diabetes mellitus), secondary-steroid-induced 2012    Fibromyalgia     GERD (gastroesophageal reflux disease)     H/O Mycobacterium avium intracellulare infection     Hearing loss     Heart murmur     Hyperlipidemia  Hypertension     Hypertensive heart disease     Irritable bowel disease     Joint pain     Lung disease     Migraines     Nausea     Pancreatitis     Pulmonary embolism (HCC)     Seasonal allergic reaction     Seizure disorder (HCC)     Small bowel obstruction (HCC)     Steatohepatitis     felt secondary to medications    Type 2 diabetes mellitus without complication, with long-term current use of insulin (HCC) 10/22/2017    Ulcer     Vaginal tumor        Review of Systems  Positive for stomach pain, shooting pains in feet/legs.       Objective:          albuterol sulfate (PROAIR HFA) 90 mcg/actuation HFA aerosol inhaler Inhale two puffs by mouth into the lungs every 6 hours as needed for Wheezing or Shortness of Breath.    aluminum-magnesium hydroxide (MAG-AL) 200-200 mg/5 mL oral suspension Take 30 mL by mouth every 8 hours as needed.    amLODIPine (NORVASC) 5 mg tablet Take one tablet by mouth daily.    atorvastatin (LIPITOR) 40 mg tablet TAKE 1 TABLET BY MOUTH EVERY DAY    BD LUER-LOK SYRINGE 3 mL 25 x 5/8 FOR VITAMIN B12 INJECTIONS    budesonide (ENTOCORT EC) 3 mg capsule TAKE 2 CAPSULES BY MOUTH EVERY DAY buPROPion XL (WELLBUTRIN XL) 300 mg tablet TAKE ONE TABLET BY MOUTH EVERY MORNING. DO NOT CRUSH OR CHEW.    chlorthalidone (HYGROTON) 25 mg tablet Take one tablet by mouth daily.    cyanocobalamin (vitamin B-12) (RUBRAMIN PC) 1,000 mcg/mL injection solution Inject 1 mL under the skin every 30 days.    cyclobenzaprine (FLEXERIL) 10 mg tablet TAKE ONE TABLET BY MOUTH AT BEDTIME DAILY.    DEXCOM G7 SENSOR sensor device Use one each as directed before meals and at bedtime. Indications: type 2 diabetes mellitus    dexlansoprazole (DEXILANT) 60 mg capsule Take one capsule by mouth daily. Indications: gastroesophageal reflux disease    diazePAM (VALIUM) 5 mg tablet Take one tablet by mouth every 6 hours as needed for Anxiety.    diclofenac sodium (VOLTAREN) 1 % topical gel Apply four g topically to affected area four times daily.    duloxetine DR (CYMBALTA) 60 mg capsule Take one capsule by mouth daily.    EPINEPHrine (AUVI-Q) 1 mg/mL injection pen (2-Pack) Inject 0.3 mg (1 Pen) into thigh if needed for anaphylactic reaction. May repeat in 5-15 minutes if needed.    famotidine (PEPCID) 40 mg tablet Take one tablet by mouth twice daily.    glucagon (BAQSIMI) 3 mg/actuation nasal spray Use 1 spray in single nostril. If no response, may repeat in 15 minutes using second device.    hydrOXYzine HCL (ATARAX) 25 mg tablet Take one tablet by mouth three times daily.    insulin aspart (U-100) (NOVOLOG FLEXPEN U-100 INSULIN) 100 unit/mL (3 mL) PEN Inject forty Units under the skin three times daily with meals. Indications: e11.65 (Patient taking differently: Inject ten Units to twenty seven Units under the skin three times daily with meals. Indications: e11.65)    insulin degludec (TRESIBA FLEXTOUCH U-200) 200 unit/mL (3 mL) suncutaneous PEN Inject fifty Units under the skin daily. Indications: type 2 diabetes mellitus    losartan (COZAAR) 100 mg tablet Take one tablet by mouth daily.    nebivoloL (  BYSTOLIC) 10 mg tablet Take one tablet by mouth daily.    needle (disp) 25 gauge 25 gauge x 5/8 For Vitamin B12 injections    ondansetron HCL (ZOFRAN) 4 mg tablet Take one tablet by mouth every 8 hours. (Patient taking differently: Take one tablet by mouth every 8 hours as needed.)    pantoprazole DR (PROTONIX) 40 mg tablet Take one tablet by mouth daily.    pen needle, diabetic (BD NANO 2ND GEN PEN NEEDLE) 32 gauge x 5/32 pen needle Use one each as directed before meals and at bedtime. Use with insulin injections.    polyethylene glycol 3350 (MIRALAX) 17 gram/dose powder Take seventeen g by mouth twice daily.    promethazine (PHENERGAN) 25 mg tablet Take one tablet by mouth every 6 hours as needed for Nausea or Vomiting.    Syringe (Disposable) 3 mL For vitamin B12 injections    tirzepatide (MOUNJARO) 2.5 mg/0.5 mL injector PEN Inject 0.5 mL under the skin every 7 days.    traMADoL (ULTRAM) 50 mg tablet Take one tablet by mouth three times daily as needed for Pain.    vedolizumab (ENTYVIO IV) Administer  through vein every 8 weeks.      Telehealth Patient Reported Vitals       Row Name 08/08/22 0847                Weight: 96.6 kg (213 lb)        Height: 162.6 cm (5' 4)        Pain Score: Five        Pain Location: --  lower left abdominal side                      There were no vitals filed for this visit.    There is no height or weight on file to calculate BMI.      Physical Exam  Vitals and nursing note reviewed.   Constitutional:       General: She is not in acute distress.     Appearance: Normal appearance. She is not ill-appearing.   HENT:      Head: Normocephalic and atraumatic.      Nose: Nose normal.   Eyes:      Conjunctiva/sclera: Conjunctivae normal.   Neurological:      Mental Status: She is alert and oriented to person, place, and time.   Psychiatric:         Mood and Affect: Mood normal.         Behavior: Behavior normal.         Thought Content: Thought content normal.         Judgment: Judgment normal.         Labs: Comprehensive Metabolic Profile    Lab Results   Component Value Date/Time    NA 140 08/02/2022 01:30 PM    K 3.6 08/02/2022 01:30 PM    CL 102 08/02/2022 01:30 PM    CO2 28 08/02/2022 01:30 PM    GAP 10 08/02/2022 01:30 PM    BUN 26 (H) 08/02/2022 01:30 PM    CR 1.84 (H) 08/02/2022 01:30 PM    GLU 81 08/02/2022 01:30 PM    Lab Results   Component Value Date/Time    CA 9.1 08/02/2022 01:30 PM    PO4 3.4 11/03/2020 11:07 AM    ALBUMIN 4.1 08/02/2022 01:30 PM    TOTPROT 6.6 08/02/2022 01:30 PM    ALKPHOS 120 (  H) 08/02/2022 01:30 PM    AST 20 08/02/2022 01:30 PM    ALT 21 08/02/2022 01:30 PM    TOTBILI 0.5 08/02/2022 01:30 PM    EGFR1 32 (L) 08/02/2022 01:30 PM        No results found for: MCALB24   Microalbumin/CR ratio Urine   Date Value Ref Range Status   06/12/2022 15.41 <30 ug/mg Final     Comment:     NOTE NEW REFERENCE RANGES     Microalbumin, Random   Date Value Ref Range Status   06/12/2022 20.8 MCG/ML Final     Lab Results   Component Value Date    CHOL 121 06/12/2022    TRIG 162 (H) 06/12/2022    HDL 40 (L) 06/12/2022    LDL 64 06/12/2022    VLDL 32 06/12/2022    NONHDLCHOL 81 06/12/2022      TSH   Date Value Ref Range Status   06/12/2022 2.26 0.35 - 5.00 MCU/ML Final       Hemoglobin A1C   Date Value Ref Range Status   06/12/2022 6.2 (H) 4.0 - 5.7 % Final     Comment:     The ADA recommends that most patients with type 1 and type 2 diabetes maintain   an A1c level <7%.     10/14/2019 6.8 (H) 4.0 - 6.0 % Final     Comment:     The ADA recommends that most patients with type 1 and type 2 diabetes maintain   an A1c level <7%.     11/16/2018 9.1 (H) 4.0 - 6.0 % Final     Comment:     The ADA recommends that most patients with type 1 and type 2 diabetes maintain   an A1c level <7%.       Poc Hemoglobin A1C   Date Value Ref Range Status   02/13/2022 6.2 (A) 4 - 6 % Final   08/23/2021 6.6 (H) 4 - 6 % Final   12/27/2020 6.2 (A) 4 - 6 % Final     Lab Results   Component Value Date    PLTCT 265 08/02/2022 Assessment and Plan:  Diabetes mellitus type 2,  controlled  A1C 6.2%, GMI on CGM 6.2%  Target A1c <7% without significant hypoglycemia  Complicated by: Nephropathy, poorly healing wounds, pancreatitis   On Tresiba 52 units daily, Novolog 8 units with higher carb meals, mounjaro 2.5 mg weekly      Plan:  Reduce Tresiba to 50 units due to tight blood sugars overnight.   Use novolog correction as needed as below   Continue mounjaro 2.5 mg weekly. Since she has reduced appetite and marked blood sugar improvement on the low dose, we discussed continuing with the low dose for now. She will let me know if she would like to try increasing the dose in the future. She thinks the benefits out-weigh the potential risks and has been happy with the mounjaro. We are using cautiously with her history of crohn's and pancreatitis. She is fully aware of the risk of pancreatitis.   Discussed rule of 15 and how to tx hypoglycemia  Reminded pt to rotate insulin sites  She plans to start exercise with the nicer weather    Diabetic complications - prevention and management:  - Annual labs (electrolytes and renal function): Last done 05/2022-- elevated creatinine. We discussed hydration and avoiding NSAIDs.   - Annual eye exam: Last done  April 2023. Retinopathy - none  -  Hyperlipidemia: Lipid panel: Last done 05/2022         - Statin: yes         - Discussed dietary modifications (low cholesterol)  - Nephropathy: yes, CKD 3. Annual Urine microalbumin/Cr: 05/2022, normal.   - Foot exam / monofilament exam (recommended annually): Last done 2023         - Discussed foot care.   - Diabetic Educator and/or nutritionist visit (recommended annually): following with Pattie Leuyot RD [facility]     Obesity, class 2: Encouraged her on weight loss so far. She plans to start exercising.     Hypertension  Continue current regimen     RTC in 3 months   Patient Instructions   Our plan:   - Reduce Tresiba to 50 units. IF any more lows, reduce to 48 units.   - Continue mounjaro 2.5 mg weekly- let us know if you want to increase in the future.   - Novolog correction scale:   < 150: no extra   151-200: take 2 units   201-250: take 4 units   251-300: take 6 units   > 301: take 8 units         Janean Eischen Antonieta Loveless, PA-C                             Total Time Today was 40 minutes in the following activities: Preparing to see the patient, Obtaining and/or reviewing separately obtained history, Performing a medically appropriate examination and/or evaluation, Counseling and educating the patient/family/caregiver, Ordering medications, tests, or procedures, Referring and communication with other health care professionals (when not separately reported), Documenting clinical information in the electronic or other health record, Independently interpreting results (not separately reported) and communicating results to the patient/family/caregiver and Care coordination (not separately reported)

## 2022-08-09 ENCOUNTER — Encounter: Admit: 2022-08-09 | Discharge: 2022-08-09 | Payer: BC Managed Care – PPO

## 2022-08-09 NOTE — Telephone Encounter
Order for AutoPAP 8-15cm H2O placed as discussed per office note on 08/07/22    DME: [facility]  Will continue to follow up.

## 2022-08-26 ENCOUNTER — Encounter: Admit: 2022-08-26 | Discharge: 2022-08-26 | Payer: BC Managed Care – PPO

## 2022-08-26 MED ORDER — LEVALBUTEROL TARTRATE 45 MCG/ACTUATION IN HFAA
90 ug | RESPIRATORY_TRACT | 11 refills | Status: AC | PRN
Start: 2022-08-26 — End: ?

## 2022-08-26 NOTE — Telephone Encounter
Request received for xopenex.  Last office visit with Dr. Evonnie Pat on 08/07/22.  This has not been previously prescribed.     Pt has no future visits scheduled.     Call to [facility] who confirm that this is her insurance's preferred rx over albuterol.   Routed to physician to review and advise.

## 2022-08-28 ENCOUNTER — Ambulatory Visit: Admit: 2022-08-28 | Discharge: 2022-08-28 | Payer: BC Managed Care – PPO

## 2022-08-28 ENCOUNTER — Encounter: Admit: 2022-08-28 | Discharge: 2022-08-28 | Payer: BC Managed Care – PPO

## 2022-08-28 DIAGNOSIS — H9190 Unspecified hearing loss, unspecified ear: Secondary | ICD-10-CM

## 2022-08-28 DIAGNOSIS — E119 Type 2 diabetes mellitus without complications: Secondary | ICD-10-CM

## 2022-08-28 DIAGNOSIS — R011 Cardiac murmur, unspecified: Secondary | ICD-10-CM

## 2022-08-28 DIAGNOSIS — L821 Other seborrheic keratosis: Secondary | ICD-10-CM

## 2022-08-28 DIAGNOSIS — E785 Hyperlipidemia, unspecified: Secondary | ICD-10-CM

## 2022-08-28 DIAGNOSIS — I8290 Acute embolism and thrombosis of unspecified vein: Secondary | ICD-10-CM

## 2022-08-28 DIAGNOSIS — Z8619 Personal history of other infectious and parasitic diseases: Secondary | ICD-10-CM

## 2022-08-28 DIAGNOSIS — K7581 Nonalcoholic steatohepatitis (NASH): Secondary | ICD-10-CM

## 2022-08-28 DIAGNOSIS — J984 Other disorders of lung: Secondary | ICD-10-CM

## 2022-08-28 DIAGNOSIS — Z789 Other specified health status: Secondary | ICD-10-CM

## 2022-08-28 DIAGNOSIS — E139 Other specified diabetes mellitus without complications: Secondary | ICD-10-CM

## 2022-08-28 DIAGNOSIS — J479 Bronchiectasis, uncomplicated: Secondary | ICD-10-CM

## 2022-08-28 DIAGNOSIS — M2550 Pain in unspecified joint: Secondary | ICD-10-CM

## 2022-08-28 DIAGNOSIS — F419 Anxiety disorder, unspecified: Secondary | ICD-10-CM

## 2022-08-28 DIAGNOSIS — K589 Irritable bowel syndrome without diarrhea: Secondary | ICD-10-CM

## 2022-08-28 DIAGNOSIS — I10 Essential (primary) hypertension: Secondary | ICD-10-CM

## 2022-08-28 DIAGNOSIS — D4959 Neoplasm of unspecified behavior of other genitourinary organ: Secondary | ICD-10-CM

## 2022-08-28 DIAGNOSIS — N183 CKD (chronic kidney disease) stage 3, GFR 30-59 ml/min (HCC): Secondary | ICD-10-CM

## 2022-08-28 DIAGNOSIS — K219 Gastro-esophageal reflux disease without esophagitis: Secondary | ICD-10-CM

## 2022-08-28 DIAGNOSIS — F32A Depression: Secondary | ICD-10-CM

## 2022-08-28 DIAGNOSIS — I6350 Cerebral infarction due to unspecified occlusion or stenosis of unspecified cerebral artery: Secondary | ICD-10-CM

## 2022-08-28 DIAGNOSIS — G43909 Migraine, unspecified, not intractable, without status migrainosus: Secondary | ICD-10-CM

## 2022-08-28 DIAGNOSIS — K5090 Crohn's disease, unspecified, without complications: Secondary | ICD-10-CM

## 2022-08-28 DIAGNOSIS — I2699 Other pulmonary embolism without acute cor pulmonale: Secondary | ICD-10-CM

## 2022-08-28 DIAGNOSIS — K639 Disease of intestine, unspecified: Secondary | ICD-10-CM

## 2022-08-28 DIAGNOSIS — I82409 Acute embolism and thrombosis of unspecified deep veins of unspecified lower extremity: Secondary | ICD-10-CM

## 2022-08-28 DIAGNOSIS — Z23 Encounter for immunization: Secondary | ICD-10-CM

## 2022-08-28 DIAGNOSIS — D699 Hemorrhagic condition, unspecified: Secondary | ICD-10-CM

## 2022-08-28 DIAGNOSIS — K859 Acute pancreatitis without necrosis or infection, unspecified: Secondary | ICD-10-CM

## 2022-08-28 DIAGNOSIS — J302 Other seasonal allergic rhinitis: Secondary | ICD-10-CM

## 2022-08-28 DIAGNOSIS — K5900 Constipation, unspecified: Secondary | ICD-10-CM

## 2022-08-28 DIAGNOSIS — K56609 Unspecified intestinal obstruction, unspecified as to partial versus complete obstruction: Secondary | ICD-10-CM

## 2022-08-28 DIAGNOSIS — IMO0002 Ulcer: Secondary | ICD-10-CM

## 2022-08-28 DIAGNOSIS — R110 Nausea: Secondary | ICD-10-CM

## 2022-08-28 DIAGNOSIS — G40909 Epilepsy, unspecified, not intractable, without status epilepticus: Secondary | ICD-10-CM

## 2022-08-28 DIAGNOSIS — R42 Dizziness and giddiness: Secondary | ICD-10-CM

## 2022-08-28 DIAGNOSIS — K5732 Diverticulitis of large intestine without perforation or abscess without bleeding: Secondary | ICD-10-CM

## 2022-08-28 DIAGNOSIS — K50918 Crohn's disease, unspecified, with other complication: Secondary | ICD-10-CM

## 2022-08-28 DIAGNOSIS — M797 Fibromyalgia: Secondary | ICD-10-CM

## 2022-08-28 NOTE — Progress Notes
Tina Snyder is a 57 y.o. female.    INTERVAL hx    Has been on mounjaro 3 months.  Lost around 10lbs. Not using her novolog at meal time.      Wants to see derm.  Has some new SKs.     CHRONIC hx  Depression  Wellbutrin 300mg , Duloxetine 60mg  daily  Prior: Fluoxetine 10mg  Prozac, , elavil 50mg , elavil 10mg  (not taking)  H.o hospitalization as a teenage for SI.  She had a nervous breakdown in 2004 and was hospitalized for 3 days.      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 She was briefly on coumadin but stopped in setting of recurrent bleeding. She has a history of subclavian thrombosis. Recent CT imaging showed a lot of chest wall collaterals.  Pt not aware.  Not on any therapy.  [place] of her left arm showed no DVT. She had a negative VQ scan for PE. She had a CT chest 2016 Development of anterior superior mediastinal vascular collaterals secondary to marked narrowing or occlusion of the left brachiocephalic vein. 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  She had a CTA chest in 2019 with Dr. Hollie Beach and left brachiocephalic vein atresia noted with prominent collaterals. Dr. Gaspar Bidding felt she may need other systemic evaluation to help determine the cause of her swelling.  Her anti CCP and RF were negative in 2020.    Peripheral edema:  Progressive edema in her bilateral arms, underarms and chest wall and additionally in her feet. Started in LUE predominantly. Noted in RUE after a prolonged hospital stay in 2019 where she lay on her right side.  She has been on lasix since 2018.  She has a history of subclavian thrombus with chest wall collaterals in addition to intermittent steroids.  Hands are getting thicker, had to change ring size.  Ruddy skin complexion is new.     History of Iron Deficiency Anemia  Resolved with IV iron.    History of Syncope:  She has seen cardiology. Diltiazem held due to orthostatic concerns. She had a TTE on 07/02/2017 which showed EF 70%.  Carotid u/s was unremarkable. She had an EEG on 06/05/2017 which was fine.   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 Chronic NSAID use considered.     Crohns  [facility]les, GI  Budesonide 3mg ,  entyvio   Prior: mtx (fracture), entecort    Chronic Pancreatitis  [facility], GI  Dx in August 2021 after discovery following SBO.  Lipase > 1000.   EUS in 02/2020 showed evidence of chronic pancreatitis.     MAI:   [place], Pulmonary.   She has been on three times weekly azithromycin in the past.     Diabetes Management:  BMI 37  Current regimen: glargine 30 w/ aspart 16-20 w/ meals, mounjaro 2.5  Prior: farxiga (yeast recurrent)  Endocrine:  Yes We have been avoiding SGLT2/GLP-1 in this patient due to recurrent yeast infections as well as Crohn's disease.  Complications:  Nephropathy, Gastropathy/Delayed gastric emptying, CAD, PVD  Poc Hemoglobin A1C   Date Value Ref Range Status   02/13/2022 6.2 (A) 4 - 6 % Final   08/23/2021 6.6 (H) 4 - 6 % Final   12/27/2020 6.2 (A) 4 - 6 % Final     Lab Results   Component Value Date/Time  HGBA1C 6.2 (H) 06/12/2022 11:59 AM    HGBA1C 6.8 (H) 10/14/2019 02:46 PM    HGBA1C 9.1 (H) 11/16/2018 11:13 AM    A1C 6.2 (A) 02/13/2022 02:37 PM    CHOL 121 06/12/2022 11:59 AM    TRIG 162 (H) 06/12/2022 11:59 AM    HDL 40 (L) 06/12/2022 11:59 AM    LDL 64 06/12/2022 11:59 AM    VLDL 32 06/12/2022 11:59 AM    NONHDLCHOL 81 06/12/2022 11:59 AM    CR 1.84 (H) 08/02/2022 01:30 PM    MCALBR 20.8 06/12/2022 12:22 PM    Imp: Diabetes  unknown      Hypertension Management:    Current regimen: bystolic 10mg  daily, losartan 100mg , chlorthalidone 25mg   Prior: hydralazine 25mg  three times a day, diltiazem 240mg  daily,bumex 1mg ,  Spironolactone 50mg  tablet, Entivio   CVD Hx: none  Smoker: No  Outside blood pressures being performed: Yes  BP Readings from Last 3 Encounters:   08/28/22 130/70   08/02/22 124/71   06/28/22 105/69     Fibromyalgia  Prior: Duloxetine 40mg  daily, flexeril prn    Sleep Apnea  CPAP  Sleep study 10/03/2017    Social:  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   HENT:  Negative for congestion and sore throat.    Eyes:  Negative for visual disturbance.   Respiratory:  Negative for cough, chest tightness, shortness of breath and wheezing.    Cardiovascular:  Negative for chest pain, palpitations and leg swelling.   Gastrointestinal:  Negative for abdominal pain, blood in stool, nausea and vomiting.   Genitourinary:  Negative for difficulty urinating and urgency.   Musculoskeletal:  Negative for arthralgias, joint swelling, myalgias and neck pain.   Skin:  Negative for color change and rash.   Neurological:  Negative for dizziness, speech difficulty, weakness, light-headedness, numbness and headaches.   Psychiatric/Behavioral:  Negative for agitation, behavioral problems, confusion, decreased concentration, dysphoric mood, hallucinations, self-injury, sleep disturbance and suicidal ideas. The patient is not nervous/anxious and is not hyperactive.          Objective:          amLODIPine (NORVASC) 5 mg tablet Take one tablet by mouth daily.    atorvastatin (LIPITOR) 40 mg tablet TAKE 1 TABLET BY MOUTH EVERY DAY    BD LUER-LOK SYRINGE 3 mL 25 x 5/8 FOR VITAMIN B12 INJECTIONS    budesonide (ENTOCORT EC) 3 mg capsule TAKE 2 CAPSULES BY MOUTH EVERY DAY    buPROPion XL (WELLBUTRIN XL) 300 mg tablet TAKE ONE TABLET BY MOUTH EVERY MORNING. DO NOT CRUSH OR CHEW.    chlorthalidone (HYGROTON) 25 mg tablet Take one tablet by mouth daily.    cyanocobalamin (vitamin B-12) (RUBRAMIN PC) 1,000 mcg/mL injection solution Inject 1 mL under the skin every 30 days.    cyclobenzaprine (FLEXERIL) 10 mg tablet TAKE ONE TABLET BY MOUTH AT BEDTIME DAILY.    DEXCOM G7 SENSOR sensor device Use one each as directed every 10 days. Indications: type 2 diabetes mellitus    dexlansoprazole (DEXILANT) 60 mg capsule Take one capsule by mouth daily. Indications: gastroesophageal reflux disease    diclofenac sodium (VOLTAREN) 1 % topical gel Apply four g topically to affected area four times daily.    duloxetine DR (CYMBALTA)  60 mg capsule Take one capsule by mouth daily.    EPINEPHrine (AUVI-Q) 1 mg/mL injection pen (2-Pack) Inject 0.3 mg (1 Pen) into thigh if needed for anaphylactic reaction. May repeat in 5-15 minutes if needed.    famotidine (PEPCID) 40 mg tablet Take one tablet by mouth twice daily.    glucagon (BAQSIMI) 3 mg/actuation nasal spray Use 1 spray in single nostril. If no response, may repeat in 15 minutes using second device.    hydrOXYzine HCL (ATARAX) 25 mg tablet Take one tablet by mouth three times daily.    insulin aspart (U-100) (NOVOLOG FLEXPEN U-100 INSULIN) 100 unit/mL (3 mL) PEN Inject forty Units under the skin three times daily with meals. Indications: e11.65 (Patient taking differently: Inject ten Units to twenty seven Units under the skin three times daily with meals. Indications: e11.65)    insulin degludec (TRESIBA FLEXTOUCH U-200) 200 unit/mL (3 mL) suncutaneous PEN Inject fifty Units under the skin daily. Indications: type 2 diabetes mellitus    levalbuterol tartrate (XOPENEX HFA) 45 mcg/actuation inhaler Inhale two puffs by mouth into the lungs every 4-6 hours as needed for Wheezing or Shortness of Breath.    losartan (COZAAR) 100 mg tablet Take one tablet by mouth daily.    nebivoloL (BYSTOLIC) 10 mg tablet Take one tablet by mouth daily.    needle (disp) 25 gauge 25 gauge x 5/8 For Vitamin B12 injections    pantoprazole DR (PROTONIX) 40 mg tablet Take one tablet by mouth daily.    pen needle, diabetic (BD NANO 2ND GEN PEN NEEDLE) 32 gauge x 5/32 pen needle Use one each as directed before meals and at bedtime. Use with insulin injections.    polyethylene glycol 3350 (MIRALAX) 17 gram/dose powder Take seventeen g by mouth twice daily.    Syringe (Disposable) 3 mL For vitamin B12 injections    tirzepatide (MOUNJARO) 2.5 mg/0.5 mL injector PEN Inject 0.5 mL under the skin every 7 days.    vedolizumab (ENTYVIO IV) Administer  through vein every 8 weeks.     Vitals:    08/28/22 1102   BP: 130/70   BP Source: Arm, Left Upper   Pulse: 80   Resp: 21   PainSc: Six   Weight: 94.5 kg (208 lb 6.4 oz)   Height: 162.6 cm (5' 4)     Vitals:    08/28/22 1102   BP: 130/70   BP Source: Arm, Left Upper   Pulse: 80   Resp: 21   PainSc: Six   Weight: 94.5 kg (208 lb 6.4 oz)   Height: 162.6 cm (5' 4)       Body mass index is 35.77 kg/m?Marland Kitchen     Physical Exam  Vitals and nursing note reviewed.   Constitutional:       Appearance: Normal appearance. She is well-developed.   HENT:      Head: Normocephalic and atraumatic.   Eyes:      Conjunctiva/sclera: Conjunctivae normal.   Cardiovascular:      Rate and Rhythm: Normal rate.   Pulmonary:      Effort: Pulmonary effort is normal.      Breath sounds: Normal breath sounds.   Abdominal:      General: Bowel sounds are normal.      Palpations: Abdomen is soft.   Musculoskeletal:         General: Normal range of motion.      Cervical back: Normal range of motion.   Skin:  General: Skin is warm and dry.   Neurological:      Mental Status: She is alert.   Psychiatric:         Mood and Affect: Mood normal.         Behavior: Behavior normal.         Thought Content: Thought content normal.         Judgment: Judgment normal.       Labwork reviewed:  Lab Results   Component Value Date/Time    HGBA1C 6.2 (H) 06/12/2022 11:59 AM    HGBA1C 6.8 (H) 10/14/2019 02:46 PM    HGBA1C 9.1 (H) 11/16/2018 11:13 AM    A1C 6.2 (A) 02/13/2022 02:37 PM    MCALBR 20.8 06/12/2022 12:22 PM    TSH 2.26 06/12/2022 11:59 AM    FREET4R 0.7 09/25/2007 12:30 PM    CHOL 121 06/12/2022 11:59 AM    TRIG 162 (H) 06/12/2022 11:59 AM    HDL 40 (L) 06/12/2022 11:59 AM    LDL 64 06/12/2022 11:59 AM    NA 140 08/02/2022 01:30 PM    K 3.6 08/02/2022 01:30 PM    CL 102 08/02/2022 01:30 PM    CO2 28 08/02/2022 01:30 PM    GAP 10 08/02/2022 01:30 PM    BUN 26 (H) 08/02/2022 01:30 PM    CR 1.84 (H) 08/02/2022 01:30 PM    GLU 81 08/02/2022 01:30 PM    CA 9.1 08/02/2022 01:30 PM    PO4 3.4 11/03/2020 11:07 AM    ALBUMIN 4.1 08/02/2022 01:30 PM    TOTPROT 6.6 08/02/2022 01:30 PM    ALKPHOS 120 (H) 08/02/2022 01:30 PM    AST 20 08/02/2022 01:30 PM    ALT 21 08/02/2022 01:30 PM    TOTBILI 0.5 08/02/2022 01:30 PM    GFR 43 (L) 03/23/2020 01:04 PM    GFRAA 52 (L) 03/23/2020 01:04 PM            Assessment and Plan:    Melena L. Peter was seen today    Diagnoses and all orders for this visit:    Depression - Major- partial  Fibromyalgia  Better control  > continue duloxetine  > continue wellbutrin to 300  > encourage ongoing discussion w/ pastor.    Pulmonary Consolidation on imaging  Mycobacterium avium-intracellulare complex (HCC)  Noted recently on imaging a pulmonary consolidation. She is going to follow up with Dr. Evonnie Pat.  Likely atelectasis.  > 12 mo CT ordered by pulm    Hypertension  CKD  Better controlled with chlorthalidone, and amlodipine -working on BMI reduction. Still not at goal.  > continue bystolic  > continue losartan 100mg   > chlorthalidone 25  > continue amlodipine 5   > BMI reduction    Type 2 diabetes mellitus without complication, without long-term current use of insulin (HCC):   Recurrent genitourinary yeast  Follows in endocrine here and well controlled.   Genitourinary yeast infections resolved with stopping farxiga.   Doesn't want to increase yet, still losing weight.   > continue mounjaro 2.5.  > continue insulin  > LDL at goal 70  > pna-20 today     BMI 39-> 37-> 35  Prior history of recurrent pancreatitis with EUS findings suggesting chronic.   Uncontrolled Hypertension  Fatty Liver  She is doing outstanding on mounjaro!  Lost 20lbs, no side effects. .  > continue, increase prn, ok to reach out.       Crohn's disease  of small intestine with other complication [facility]):   Chronic and active. Normal stool.   > colonoscopy up to date  > reviewed recommendations  > b12 monthly  > b12    Elevated Alk Phos / Normal GGTP  CKD3  Reviewed work up which has included liver u/s and bone scan were all reassuring. Certainly at risk for sclerosing cholangitis w/ her history of crohns but the GGTP was normal.  This can all be renal associated.   We can continue to follow.     OSA: Tolerating new CPAP    Peripheral Edema:   Post Thrombotic Syndrome  I continue to suspect this is post thrombotic syndrome. Upper >> lower extremity. 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 Hollie Beach does not feel this is responsible for her edema.  She has had significant improvement with lymphedema therapy and will continue to do so.                                  There are no Patient Instructions on file for this visit.      No follow-ups on file.   Answers for HPI/ROS submitted by the patient on 12/21/2019  What medical problem brings you in to see the provider?: Follow up from hospital stay and medication adjustment  Your symptom is...: recurrent  When did they start?: more than 1 month ago  How often do you feel this symptom?: constantly  How has the symptom changed?: unchanged  On a scale of 0 to 10 (10 being the worst), how strong is your pain?: 7/10  How severe is your pain?: moderate  No appetite: No  Change in bowel habit: Yes  Swollen glands: No  Urinary symptoms: No  Vertigo: Yes  Visual change: No  Which of the following, if any, make(s) your symptom worse?: drinking, eating, exertion, stress  Which of the following treatments have you tried?: heat, ice, immobilization, lying down, rest, sleep, walking  If you've tried a treatment for this problem, how much relief did you experience?: moderate      Answers for HPI/ROS submitted by the patient on 08/30/2020  What medical problem brings you in to see the provider?: Depression, Numbness is feet, arms and hands  Your symptom is...: recurrent  When did they start?: 1 to 4 weeks ago  How often do you feel this symptom?: 2 to 4 times per day  How has the symptom changed?: unchanged  On a scale of 0 to 10 (10 being the worst), how strong is your pain?: 6/10  How severe is your pain?: moderate  No appetite: Yes  Change in bowel habit: No  Swollen glands: No  Urinary symptoms: No  Vertigo: No  Visual change: No  Which of the following, if any, make(s) your symptom worse?: bending, standing, stress, walking  Which of the following treatments have you tried?: acetaminophen, heat, ice, NSAIDs, position changes, rest, sleep, walking  If you've tried a treatment for this problem, how much relief did you experience?: no relief      Answers for HPI/ROS submitted by the patient on 11/21/2020  What medical problem brings you in to see the provider?: Test results and medicine check  Your symptom is...: recurrent  How has the symptom changed?: waxing and waning  On a scale of 0 to 10 (10 being the worst), how strong is your pain?: 7/10  How severe  is your pain?: moderate  No appetite: Yes  Change in bowel habit: Yes  Swollen glands: Yes  Urinary symptoms: No  Vertigo: No  Visual change: Yes  Which of the following, if any, make(s) your symptom worse?: bending, exertion, intercourse, standing, stress, twisting, walking  Which of the following treatments have you tried?: acetaminophen, heat, ice, lying down, NSAIDs, oral narcotics, position changes, relaxation, rest, sleep, walking  If you've tried a treatment for this problem, how much relief did you experience?: mild      Answers submitted by the patient for this visit:  High Blood Pressure Questionnaire (Submitted on 05/10/2022)  Chief Complaint: Hypertension  Chronicity: recurrent  Onset: more than 1 year ago  Progression since onset: gradually worsening  Condition status: resistant  anxiety: Yes  blurred vision: No  malaise/fatigue: Yes  orthopnea: No  peripheral edema: No  PND: No  sweats: No  CAD risks: diabetes mellitus, family history, obesity, post-menopausal state, sedentary lifestyle, stress    Answers submitted by the patient for this visit:  Other Symptom Questionnaire (Submitted on 08/23/2022)  Chief Complaint: Patient Reported Other  What topic(s) would you like to cover during your appointment?: Weight, Diabetes , Physical  Please describe the issue(s) and history with the issue (location, severity, duration, symptoms, etc.). : Goes up and down  What has been done so far to take care of the issue(s)?: Walk  What are your goals for this visit?: Come up woth a plan

## 2022-08-29 ENCOUNTER — Encounter: Admit: 2022-08-29 | Discharge: 2022-08-29 | Payer: BC Managed Care – PPO

## 2022-09-02 ENCOUNTER — Encounter: Admit: 2022-09-02 | Discharge: 2022-09-02 | Payer: BC Managed Care – PPO

## 2022-09-02 NOTE — Telephone Encounter
Order for noc ox on cpap  placed per protocol   DME: [facility]  Will continue to follow up.

## 2022-09-09 ENCOUNTER — Encounter: Admit: 2022-09-09 | Discharge: 2022-09-09 | Payer: BC Managed Care – PPO

## 2022-09-09 DIAGNOSIS — E538 Deficiency of other specified B group vitamins: Secondary | ICD-10-CM

## 2022-09-09 MED ORDER — CYANOCOBALAMIN (VITAMIN B-12) 1,000 MCG/ML IJ SOLN
2 refills
Start: 2022-09-09 — End: ?

## 2022-09-13 ENCOUNTER — Encounter: Admit: 2022-09-13 | Discharge: 2022-09-13 | Payer: BC Managed Care – PPO

## 2022-09-15 ENCOUNTER — Encounter: Admit: 2022-09-15 | Discharge: 2022-09-15 | Payer: BC Managed Care – PPO

## 2022-09-16 MED FILL — DEXCOM G7 SENSOR MISC DEVI: 30 days supply | Qty: 3 | Fill #2 | Status: AC

## 2022-09-20 ENCOUNTER — Encounter: Admit: 2022-09-20 | Discharge: 2022-09-20 | Payer: BC Managed Care – PPO

## 2022-09-22 ENCOUNTER — Encounter: Admit: 2022-09-22 | Discharge: 2022-09-22 | Payer: BC Managed Care – PPO

## 2022-09-23 ENCOUNTER — Encounter: Admit: 2022-09-23 | Discharge: 2022-09-23 | Payer: BC Managed Care – PPO

## 2022-09-23 ENCOUNTER — Ambulatory Visit: Admit: 2022-09-23 | Discharge: 2022-09-23 | Payer: BC Managed Care – PPO

## 2022-09-23 DIAGNOSIS — J479 Bronchiectasis, uncomplicated: Secondary | ICD-10-CM

## 2022-09-24 ENCOUNTER — Encounter: Admit: 2022-09-24 | Discharge: 2022-09-24 | Payer: BC Managed Care – PPO

## 2022-09-26 ENCOUNTER — Encounter: Admit: 2022-09-26 | Discharge: 2022-09-26 | Payer: BC Managed Care – PPO

## 2022-09-26 DIAGNOSIS — K50018 Crohn's disease of small intestine with other complication: Secondary | ICD-10-CM

## 2022-09-26 DIAGNOSIS — M797 Fibromyalgia: Secondary | ICD-10-CM

## 2022-09-26 MED ORDER — NEBIVOLOL 10 MG PO TAB
10 mg | ORAL_TABLET | Freq: Every day | ORAL | 1 refills | 60.00000 days | Status: AC
Start: 2022-09-26 — End: ?

## 2022-09-26 MED ORDER — BUDESONIDE 3 MG PO CECX
ORAL_CAPSULE | ORAL | 1 refills | 30.00000 days | Status: AC
Start: 2022-09-26 — End: ?

## 2022-09-26 MED ORDER — DULOXETINE 60 MG PO CPDR
60 mg | ORAL_CAPSULE | Freq: Every day | ORAL | 3 refills | 60.00000 days | Status: AC
Start: 2022-09-26 — End: ?

## 2022-09-26 NOTE — Telephone Encounter
Some refill protocol elements NOT Met  Medication name: Bystolic  Medication Strength: 10mg  tablet      Abnormal labs   Cr in normal range and within 180 days    eGFR in normal range and within 180 days   Cr 1.84 on 08/02/22  eGFR 32 08/02/22    Routed to Provider    Shelly Bombard, RN

## 2022-09-26 NOTE — Telephone Encounter
Refill request received for:    budesonide (ENTOCORT EC) 3 mg capsule     Sig: TAKE 2 CAPSULES BY MOUTH EVERY DAY       Last OV 03/05/22  Last fill: 07/02/22    Routing to Dr. Janene Madeira for approval/refusal

## 2022-09-27 ENCOUNTER — Encounter: Admit: 2022-09-27 | Discharge: 2022-09-27 | Payer: BC Managed Care – PPO

## 2022-09-27 ENCOUNTER — Ambulatory Visit: Admit: 2022-09-27 | Discharge: 2022-09-28 | Payer: BC Managed Care – PPO

## 2022-09-27 LAB — VITAMIN B12: VITAMIN B12: 357 pg/mL (ref 180–914)

## 2022-09-27 MED ORDER — VEDOLIZUMAB IVPB
300 mg | Freq: Once | INTRAVENOUS | 0 refills
Start: 2022-09-27 — End: ?

## 2022-09-27 MED ORDER — DIPHENHYDRAMINE HCL 50 MG/ML IJ SOLN
25 mg | Freq: Once | INTRAVENOUS | 0 refills
Start: 2022-09-27 — End: ?

## 2022-09-27 MED ORDER — DIPHENHYDRAMINE HCL 50 MG/ML IJ SOLN
25 mg | Freq: Once | INTRAVENOUS | 0 refills | Status: CP | PRN
Start: 2022-09-27 — End: ?
  Administered 2022-09-27: 18:00:00 25 mg via INTRAVENOUS

## 2022-09-27 MED ORDER — ACETAMINOPHEN 500 MG PO TAB
500 mg | Freq: Once | ORAL | 0 refills | Status: CP
Start: 2022-09-27 — End: ?
  Administered 2022-09-27: 17:00:00 500 mg via ORAL

## 2022-09-27 MED ORDER — VEDOLIZUMAB IVPB
300 mg | Freq: Once | INTRAVENOUS | 0 refills | Status: CP
Start: 2022-09-27 — End: ?
  Administered 2022-09-27 (×2): 300 mg via INTRAVENOUS

## 2022-09-27 MED ORDER — ACETAMINOPHEN 500 MG PO TAB
500 mg | Freq: Once | ORAL | 0 refills
Start: 2022-09-27 — End: ?

## 2022-09-27 MED ORDER — DIPHENHYDRAMINE HCL 50 MG/ML IJ SOLN
25 mg | Freq: Once | INTRAVENOUS | 0 refills | Status: CP
Start: 2022-09-27 — End: ?
  Administered 2022-09-27: 17:00:00 25 mg via INTRAVENOUS

## 2022-09-27 NOTE — Progress Notes
Patient tolerated Entyvio infusion; no reaction noted.

## 2022-09-28 DIAGNOSIS — K50918 Crohn's disease, unspecified, with other complication: Secondary | ICD-10-CM

## 2022-09-28 DIAGNOSIS — E538 Deficiency of other specified B group vitamins: Secondary | ICD-10-CM

## 2022-10-02 ENCOUNTER — Encounter: Admit: 2022-10-02 | Discharge: 2022-10-02 | Payer: BC Managed Care – PPO

## 2022-10-02 NOTE — Telephone Encounter
Order for  d/c oxygen  placed    DME: [facility]

## 2022-10-02 NOTE — Telephone Encounter
Regarding: Night o2 test  Contact: [phone]  ----- Message from Brynda Greathouse, RN sent at 10/01/2022 11:56 AM CDT -----  Please send discontinue oxygen order to [facility] thank you     ----- Message sent from Brynda Greathouse, RN to [name] at 10/01/2022 11:55 AM -----   Tina Snyder    Your overnight oxygen results came back normal. We will discontinue your oxygen. I will let [facility] know.    Thank you    Brynda Greathouse, RN      ----- Message -----       From:Sarah K       Sent:08/30/2022 12:27 PM CDT         NW:[id number] Floyce Stakes    Subject:Night o2 test    Hi Laveah    Thank you so much for the message. It looks like that order did not get entered in, so I have put it in the system now. Please call [facility] first thing Monday to schedule the noc ox. Their number is [phone].    Thanks    Brynda Greathouse, RN        ----- Message -----       From:Bess Floyce Stakes       Sent:08/29/2022 11:50 AM CDT         To:S Duthuluru, MD    Subject:Night o2 test    At my last appointment, you said you were going to order an O2 test for sleeping. I have not received anything yet to do that.     Also, since PFT came back normal, do we DC oxygen now?    [facility]

## 2022-10-16 ENCOUNTER — Encounter: Admit: 2022-10-16 | Discharge: 2022-10-16 | Payer: BC Managed Care – PPO

## 2022-10-16 DIAGNOSIS — E1165 Type 2 diabetes mellitus with hyperglycemia: Secondary | ICD-10-CM

## 2022-10-16 MED ORDER — PEN NEEDLE, DIABETIC 32 GAUGE X 5/32" MISC NDLE
2 refills | 90.00000 days | Status: AC
Start: 2022-10-16 — End: ?

## 2022-10-17 ENCOUNTER — Encounter: Admit: 2022-10-17 | Discharge: 2022-10-17 | Payer: BC Managed Care – PPO

## 2022-10-17 MED FILL — DEXCOM G7 SENSOR MISC DEVI: 30 days supply | Qty: 3 | Fill #3 | Status: AC

## 2022-10-24 ENCOUNTER — Encounter: Admit: 2022-10-24 | Discharge: 2022-10-24 | Payer: BC Managed Care – PPO

## 2022-10-24 DIAGNOSIS — F410 Panic disorder [episodic paroxysmal anxiety] without agoraphobia: Secondary | ICD-10-CM

## 2022-10-24 MED ORDER — CYCLOBENZAPRINE 10 MG PO TAB
10 mg | ORAL_TABLET | Freq: Every evening | ORAL | 0 refills
Start: 2022-10-24 — End: ?

## 2022-10-24 MED ORDER — HYDROXYZINE HCL 25 MG PO TAB
25 mg | ORAL_TABLET | Freq: Three times a day (TID) | ORAL | 3 refills
Start: 2022-10-24 — End: ?

## 2022-11-03 ENCOUNTER — Encounter: Admit: 2022-11-03 | Discharge: 2022-11-03 | Payer: BC Managed Care – PPO

## 2022-11-03 NOTE — Telephone Encounter
An encounter has been created for documentation only (often for preparation of an upcoming appointment or for follow up on orders/imaging) and patient does not need contact RN and did not miss a phone call or appointment.     Pre-visit planning for 11/07/22 appointment with Alean Rinne.    SS 10/10/17  AHI: 133; O2 less than 88% for 70 min. Titrated to CPAP 14.     compliance follow up (was due for new machine)    LV: 08/07/22 with Dr. Evonnie Pat    Bronchiectasis  -SOB - + PFTs and ex ox.   +CT in 6 mo following GGO    Pt set up with AutoPAP @ 8-15cm H2O on 08/23/22.  DME: [facility] / [facility]  Insurance: Rudean Curt  If the patient has to meet compliance, their compliance window will be from 09/23/22 to 11/21/22.     ONO on BiPAP normal in May 2024. O2 discontinued.     RN obtained download from [facility].

## 2022-11-11 ENCOUNTER — Encounter: Admit: 2022-11-11 | Discharge: 2022-11-11 | Payer: BC Managed Care – PPO

## 2022-11-12 ENCOUNTER — Encounter: Admit: 2022-11-12 | Discharge: 2022-11-12 | Payer: BC Managed Care – PPO

## 2022-11-12 MED FILL — DEXCOM G7 SENSOR MISC DEVI: 30 days supply | Qty: 3 | Fill #4 | Status: AC

## 2022-11-13 ENCOUNTER — Encounter: Admit: 2022-11-13 | Discharge: 2022-11-13 | Payer: BC Managed Care – PPO

## 2022-11-19 ENCOUNTER — Encounter: Admit: 2022-11-19 | Discharge: 2022-11-19 | Payer: BC Managed Care – PPO

## 2022-11-22 ENCOUNTER — Ambulatory Visit: Admit: 2022-11-22 | Discharge: 2022-11-23 | Payer: BC Managed Care – PPO

## 2022-11-22 ENCOUNTER — Encounter: Admit: 2022-11-22 | Discharge: 2022-11-22 | Payer: BC Managed Care – PPO

## 2022-11-22 MED ORDER — ACETAMINOPHEN 500 MG PO TAB
500 mg | Freq: Once | ORAL | 0 refills
Start: 2022-11-22 — End: ?

## 2022-11-22 MED ORDER — VEDOLIZUMAB IVPB
300 mg | Freq: Once | INTRAVENOUS | 0 refills
Start: 2022-11-22 — End: ?

## 2022-11-22 MED ORDER — ACETAMINOPHEN 500 MG PO TAB
500 mg | Freq: Once | ORAL | 0 refills | Status: CP
Start: 2022-11-22 — End: ?
  Administered 2022-11-22: 18:00:00 500 mg via ORAL

## 2022-11-22 MED ORDER — DIPHENHYDRAMINE HCL 50 MG/ML IJ SOLN
25 mg | Freq: Once | INTRAVENOUS | 0 refills | Status: CP
Start: 2022-11-22 — End: ?
  Administered 2022-11-22: 18:00:00 25 mg via INTRAVENOUS

## 2022-11-22 MED ORDER — DIPHENHYDRAMINE HCL 50 MG/ML IJ SOLN
25 mg | Freq: Once | INTRAVENOUS | 0 refills | Status: CP | PRN
Start: 2022-11-22 — End: ?
  Administered 2022-11-22: 18:00:00 25 mg via INTRAVENOUS

## 2022-11-22 MED ORDER — DIPHENHYDRAMINE HCL 50 MG/ML IJ SOLN
25 mg | Freq: Once | INTRAVENOUS | 0 refills
Start: 2022-11-22 — End: ?

## 2022-11-22 MED ORDER — VEDOLIZUMAB IVPB
300 mg | Freq: Once | INTRAVENOUS | 0 refills | Status: CP
Start: 2022-11-22 — End: ?
  Administered 2022-11-22 (×2): 300 mg via INTRAVENOUS

## 2022-11-22 NOTE — Progress Notes
Patient tolerated Entyvio infusion; no reaction noted.

## 2022-11-23 DIAGNOSIS — K50918 Crohn's disease, unspecified, with other complication: Secondary | ICD-10-CM

## 2022-12-17 ENCOUNTER — Encounter: Admit: 2022-12-17 | Discharge: 2022-12-17 | Payer: BC Managed Care – PPO

## 2022-12-17 NOTE — Telephone Encounter
An encounter has been created for documentation only (often for preparation of an upcoming appointment or for follow up on orders/imaging) and patient does not need contact RN and did not miss a phone call or appointment.     RN prepped patient information for clinic with Christie Beckers APRN NP on 01/15/23 at 1400 via in person appt.     Here for overdue follow up Pt was due for compliance from 6/3-8/1.  Pt cancelled appt sent letter to get rescheduled  Dx:OSA, Bronchial atresia, chronic resp failure  Pt known to sleep department by Dr. Janeece Fitting    PMH of asthma, OSA and obesity     Last SS: 10/10/17 with AHI 133  Noc ox 09/24/22-  oxygen discontinued.    Pt set up with AutoPAP @ 8-15cm H2O on 08/23/22.      LV: 08/07/22 with Dr Janeece Fitting   Meds:n/a    Last CT chest WO contrast 06/29/22  PFT 09/23/22  PF exercise oximetry 08/28/22    [id number]  RN obtained download from [facility]

## 2022-12-18 ENCOUNTER — Encounter: Admit: 2022-12-18 | Discharge: 2022-12-18 | Payer: BC Managed Care – PPO

## 2022-12-18 MED ORDER — CYCLOBENZAPRINE 10 MG PO TAB
10 mg | ORAL_TABLET | Freq: Every evening | ORAL | 0 refills | 30.00000 days | Status: AC
Start: 2022-12-18 — End: ?

## 2022-12-18 NOTE — Telephone Encounter
Some refill protocol elements NOT Met  Medication name: flexeril  Medication Strength: 10 mg    Last Fill Date: 10/25/22    Non-delegated medication    Routed to Provider

## 2022-12-30 ENCOUNTER — Encounter: Admit: 2022-12-30 | Discharge: 2022-12-30 | Payer: BC Managed Care – PPO

## 2023-01-01 ENCOUNTER — Encounter: Admit: 2023-01-01 | Discharge: 2023-01-01 | Payer: BC Managed Care – PPO

## 2023-01-01 DIAGNOSIS — F410 Panic disorder [episodic paroxysmal anxiety] without agoraphobia: Secondary | ICD-10-CM

## 2023-01-01 DIAGNOSIS — E1122 Type 2 diabetes mellitus with diabetic chronic kidney disease: Secondary | ICD-10-CM

## 2023-01-01 MED ORDER — HYDROXYZINE HCL 25 MG PO TAB
25 mg | ORAL_TABLET | Freq: Three times a day (TID) | ORAL | 0 refills | 30.00000 days | Status: AC
Start: 2023-01-01 — End: ?

## 2023-01-01 MED ORDER — MOUNJARO 2.5 MG/0.5 ML SC PNIJ
2.5 mg | SUBCUTANEOUS | 3 refills
Start: 2023-01-01 — End: ?

## 2023-01-04 ENCOUNTER — Encounter: Admit: 2023-01-04 | Discharge: 2023-01-04 | Payer: BC Managed Care – PPO

## 2023-01-04 MED FILL — DEXCOM G7 SENSOR MISC DEVI: 30 days supply | Qty: 3 | Fill #5 | Status: AC

## 2023-01-07 ENCOUNTER — Ambulatory Visit: Admit: 2023-01-07 | Discharge: 2023-01-07 | Payer: BC Managed Care – PPO

## 2023-01-07 ENCOUNTER — Encounter: Admit: 2023-01-07 | Discharge: 2023-01-07 | Payer: BC Managed Care – PPO

## 2023-01-07 ENCOUNTER — Ambulatory Visit: Admit: 2023-01-07 | Discharge: 2023-01-08 | Payer: BC Managed Care – PPO

## 2023-01-07 DIAGNOSIS — R1012 Left upper quadrant pain: Secondary | ICD-10-CM

## 2023-01-07 DIAGNOSIS — D849 Immunodeficiency, unspecified: Secondary | ICD-10-CM

## 2023-01-07 DIAGNOSIS — R197 Diarrhea, unspecified: Secondary | ICD-10-CM

## 2023-01-07 DIAGNOSIS — K5080 Crohn's disease of both small and large intestine without complications: Secondary | ICD-10-CM

## 2023-01-07 DIAGNOSIS — K5000 Crohn's disease of small intestine without complications: Secondary | ICD-10-CM

## 2023-01-07 DIAGNOSIS — Z9049 Acquired absence of other specified parts of digestive tract: Secondary | ICD-10-CM

## 2023-01-07 DIAGNOSIS — K861 Other chronic pancreatitis: Secondary | ICD-10-CM

## 2023-01-07 LAB — CBC AND DIFF
ABSOLUTE BASO COUNT: 0 10*3/uL (ref 0–0.20)
ABSOLUTE EOS COUNT: 0.1 10*3/uL (ref 0–0.45)
RBC COUNT: 4.3 M/UL (ref 4.0–5.0)
WBC COUNT: 13 10*3/uL — ABNORMAL HIGH (ref 4.5–11.0)

## 2023-01-07 LAB — COMPREHENSIVE METABOLIC PANEL
ALBUMIN: 4.4 g/dL — ABNORMAL LOW (ref 3.5–5.0)
ALK PHOSPHATASE: 116 U/L — ABNORMAL HIGH (ref 25–110)
ALT: 11 U/L — ABNORMAL HIGH (ref 7–56)
ANION GAP: 13 10*3/uL — ABNORMAL HIGH (ref 3–12)
AST: 16 U/L (ref 7–40)
CALCIUM: 9.7 mg/dL (ref 8.5–10.6)
CO2: 24 MMOL/L (ref 21–30)
CREATININE: 1.9 mg/dL — ABNORMAL HIGH (ref 0.4–1.00)
EGFR: 30 mL/min — ABNORMAL LOW (ref >60)
GLUCOSE,PANEL: 101 mg/dL — ABNORMAL HIGH (ref 70–100)
POTASSIUM: 3.8 MMOL/L (ref 3.5–5.1)
SODIUM: 140 MMOL/L (ref 137–147)
TOTAL BILIRUBIN: 0.6 mg/dL — ABNORMAL HIGH (ref 0.2–1.3)
TOTAL PROTEIN: 7.4 g/dL (ref 6.0–8.0)

## 2023-01-07 LAB — C REACTIVE PROTEIN (CRP): C-REACTIVE PROTEIN: 0.4 mg/dL — ABNORMAL HIGH (ref <1.0)

## 2023-01-07 LAB — SED RATE: ESR: 20 mm/h (ref 0–30)

## 2023-01-07 LAB — LIPASE: LIPASE: 41 U/L (ref 11–82)

## 2023-01-07 MED ORDER — DOXYCYCLINE HYCLATE 100 MG PO TAB
100 mg | ORAL_TABLET | Freq: Two times a day (BID) | ORAL | 0 refills | 8.00000 days | Status: AC
Start: 2023-01-07 — End: ?

## 2023-01-07 MED ORDER — PEG 3350-ELECTROLYTES 236-22.74-6.74 -5.86 GRAM PO SOLR
0 refills | 1.00000 days | Status: AC
Start: 2023-01-07 — End: ?

## 2023-01-07 MED ORDER — CREON 36,000-114,000- 180,000 UNIT PO CPDR
2 | ORAL_CAPSULE | Freq: Three times a day (TID) | ORAL | 3 refills | 30.00000 days | Status: AC
Start: 2023-01-07 — End: ?

## 2023-01-08 DIAGNOSIS — K638219 Small intestinal bacterial overgrowth, unspecified: Secondary | ICD-10-CM

## 2023-01-08 DIAGNOSIS — K861 Other chronic pancreatitis: Secondary | ICD-10-CM

## 2023-01-09 ENCOUNTER — Encounter: Admit: 2023-01-09 | Discharge: 2023-01-09 | Payer: BC Managed Care – PPO

## 2023-01-09 ENCOUNTER — Ambulatory Visit: Admit: 2023-01-09 | Discharge: 2023-01-09 | Payer: BC Managed Care – PPO

## 2023-01-09 DIAGNOSIS — R197 Diarrhea, unspecified: Secondary | ICD-10-CM

## 2023-01-09 DIAGNOSIS — K5000 Crohn's disease of small intestine without complications: Secondary | ICD-10-CM

## 2023-01-09 DIAGNOSIS — K861 Other chronic pancreatitis: Secondary | ICD-10-CM

## 2023-01-09 LAB — C DIFFICILE BY PCR: C. DIFF TOXIN B PCR: NEGATIVE

## 2023-01-11 ENCOUNTER — Encounter: Admit: 2023-01-11 | Discharge: 2023-01-11 | Payer: BC Managed Care – PPO

## 2023-01-11 DIAGNOSIS — I10 Essential (primary) hypertension: Secondary | ICD-10-CM

## 2023-01-11 DIAGNOSIS — F324 Major depressive disorder, single episode, in partial remission: Secondary | ICD-10-CM

## 2023-01-11 DIAGNOSIS — E782 Mixed hyperlipidemia: Secondary | ICD-10-CM

## 2023-01-11 MED ORDER — LOSARTAN 100 MG PO TAB
100 mg | ORAL_TABLET | Freq: Every day | ORAL | 3 refills
Start: 2023-01-11 — End: ?

## 2023-01-11 MED ORDER — BUPROPION XL 300 MG PO TB24
300 mg | ORAL_TABLET | Freq: Every morning | ORAL | 1 refills | Status: AC
Start: 2023-01-11 — End: ?

## 2023-01-11 MED ORDER — ATORVASTATIN 40 MG PO TAB
40 mg | ORAL_TABLET | Freq: Every day | ORAL | 1 refills | Status: AC
Start: 2023-01-11 — End: ?

## 2023-01-13 NOTE — Progress Notes
Date of Service: 01/15/2023    Subjective:             Tina Snyder is a 57 y.o. female.    History of Present Illness  I had the pleasure of seeing Mrs. Gane in clinic for OSA follow-up. This is an overdue CPAP compliance visit. She is known to Dr. Evonnie Pat. She has severe OSA (AHI 133) and is on CPAP 8-15. She wears full face mask. Pressure is comfortable. She tolerates and benefits from therapy.  She sleeps good most of the time, but having a lot of GI issues which is waking her up.  She gets supplies ok from DME  Denies shortness of breath, gasping for air and chest pain at night     She has a history of bronchiectasis and MAC, which was treated. PFT's earlier this year were normal.  Some shortness of breath and cough, allergies are bas right now         01/09/2023     1:16 PM 08/07/2022     9:29 AM 05/07/2022    11:57 AM   Epworth Sleepiness Scale   Sitting and reading 1 1 1    Watching TV 1 1 1    Sitting inactive in a public place (e.g. a theater or a meeting) 1 1 0   As a passenger in a car for an hour without a break 2 2 2    Lying down to rest in the afternoon when circumstances permit 2 3 2    Sitting and talking to someone 0 0 0   Sitting quietly after a lunch without alcohol 1 1 1    In a car, while stopped for a few minutes in traffic 0 0 0   Epworth Sleepiness Scale Score 8 9 7                    Objective:         amLODIPine (NORVASC) 5 mg tablet Take one tablet by mouth daily.    atorvastatin (LIPITOR) 40 mg tablet TAKE 1 TABLET BY MOUTH EVERY DAY    BD LUER-LOK SYRINGE 3 mL 25 x 5/8 FOR VITAMIN B12 INJECTIONS    budesonide (ENTOCORT EC) 3 mg capsule TAKE 2 CAPSULES BY MOUTH EVERY DAY    buPROPion XL (WELLBUTRIN XL) 300 mg tablet TAKE ONE TABLET BY MOUTH EVERY MORNING. DO NOT CRUSH OR CHEW.    chlorthalidone (HYGROTON) 25 mg tablet Take one tablet by mouth daily.    cyanocobalamin (vitamin B-12) (RUBRAMIN PC) 1,000 mcg/mL injection solution Inject 1 mL under the skin every 30 days.    cyclobenzaprine (FLEXERIL) 10 mg tablet TAKE ONE TABLET BY MOUTH AT BEDTIME DAILY.    DEXCOM G7 SENSOR sensor device Use one each as directed every 10 days. Indications: type 2 diabetes mellitus    dexlansoprazole (DEXILANT) 60 mg capsule Take one capsule by mouth daily. Indications: gastroesophageal reflux disease    diclofenac sodium (VOLTAREN) 1 % topical gel Apply four g topically to affected area four times daily.    doxycycline hyclate (VIBRACIN) 100 mg tablet Take one tablet by mouth twice daily.    duloxetine DR (CYMBALTA) 60 mg capsule TAKE 1 CAPSULE BY MOUTH EVERY DAY    EPINEPHrine (AUVI-Q) 1 mg/mL injection pen (2-Pack) Inject 0.3 mg (1 Pen) into thigh if needed for anaphylactic reaction. May repeat in 5-15 minutes if needed.    famotidine (PEPCID) 40 mg tablet Take one tablet by mouth twice daily.    glucagon (  BAQSIMI) 3 mg/actuation nasal spray Use 1 spray in single nostril. If no response, may repeat in 15 minutes using second device.    hydrOXYzine HCL (ATARAX) 25 mg tablet TAKE 1 TABLET BY MOUTH THREE TIMES A DAY    insulin aspart (U-100) (NOVOLOG FLEXPEN U-100 INSULIN) 100 unit/mL (3 mL) PEN Inject forty Units under the skin three times daily with meals. Indications: e11.65 (Patient taking differently: Inject ten Units to twenty seven Units under the skin three times daily with meals. Indications: e11.65)    insulin degludec (TRESIBA FLEXTOUCH U-200) 200 unit/mL (3 mL) suncutaneous PEN Inject fifty Units under the skin daily. Indications: type 2 diabetes mellitus    levalbuterol tartrate (XOPENEX HFA) 45 mcg/actuation inhaler Inhale two puffs by mouth into the lungs every 4-6 hours as needed for Wheezing or Shortness of Breath.    lipase-protease-amylase (CREON) 36,000-114,000- 180,000 unit capsule Take two capsules by mouth three times daily with meals. Take one capsule with snacks    losartan (COZAAR) 100 mg tablet TAKE 1 TABLET BY MOUTH EVERY DAY    nebivoloL (BYSTOLIC) 10 mg tablet TAKE 1 TABLET BY MOUTH EVERY DAY    needle (disp) 25 gauge 25 gauge x 5/8 For Vitamin B12 injections    pantoprazole DR (PROTONIX) 40 mg tablet Take one tablet by mouth daily.    peg 3350-electrolytes (GOLYTELY) 236-22.74-6.74 -5.86 gram oral solution Mix as directed on package. Drink (8oz) every 10 minutes until gone. Refrigerate once mixed.    pen needle, diabetic (BD NANO 2ND GEN PEN NEEDLE) 32 gauge x 5/32 pen needle Use with insulin injections four times daily  Indications: e11.22    polyethylene glycol 3350 (MIRALAX) 17 gram/dose powder Take seventeen g by mouth twice daily.    Syringe (Disposable) 3 mL For vitamin B12 injections    tirzepatide (MOUNJARO) 2.5 mg/0.5 mL injector PEN INJECT 0.5 ML UNDER THE SKIN EVERY 7 DAYS    vedolizumab (ENTYVIO IV) Administer  through vein every 8 weeks.     Vitals:    01/15/23 1405   BP: 133/85   BP Source: Arm, Left Upper   Pulse: 71   Temp: 36.3 ?C (97.4 ?F)   SpO2: 100%   TempSrc: Skin   PainSc: Seven   Weight: 91.2 kg (201 lb)   Height: 162.6 cm (5' 4)  Comment: per pt     Body mass index is 34.5 kg/m?Marland Kitchen     Physical Exam  Vitals reviewed.   Constitutional:       Appearance: Normal appearance.   Pulmonary:      Effort: Pulmonary effort is normal. No respiratory distress.   Skin:     General: Skin is warm and dry.   Neurological:      Mental Status: She is alert and oriented to person, place, and time.   Psychiatric:         Mood and Affect: Mood normal.         Behavior: Behavior normal.         Thought Content: Thought content normal.         Judgment: Judgment normal.                Assessment and Plan:    Problem   Osa (Obstructive Sleep Apnea)    Sleep study done on 10/10/2017 which showed AHI of 133/h.  Time with saturation less than or equal to 88% for 70 minutes.  CPAP at a pressure of 14 cm was found to  be effective.     Pt set up with AutoPAP @ 8-15cm H2O on 08/23/22.  DME: [facility] / [facility]  Insurance: Rudean Curt  If the patient has to meet compliance, their compliance window will be from 09/23/22 to 11/21/22.     Oximetry 06/27/2020  On CPAP  Below 88% for 5 minutes  Oxygen Nadir 78%          OSA (obstructive sleep apnea)  Reviewed CPAP download. 30/30 days of usage.   Wearing 4 hours or greater 100% of nights. The average use on days used was 8 hours and 24 minutes.  Residual AHI 4.4.    The 95th percentile pressure was 13.4 cmH20, with a maximum of 14.4 cmH20, and a median of 11.1 cmH20.  The median leak was 1.8 L/min. She is tolerating and benefiting from therapy.     - Will continue current settings, CPAP 8-15.     - Keep in contact with DME for routine supplies, need to contact us directly if unable to communicate with DME in a timely manner.     She has our contact information and was encouraged to call us with any questions or concerns.    RTC annually    Total time- 25 minutes

## 2023-01-15 ENCOUNTER — Ambulatory Visit: Admit: 2023-01-15 | Discharge: 2023-01-16 | Payer: BC Managed Care – PPO

## 2023-01-15 ENCOUNTER — Encounter: Admit: 2023-01-15 | Discharge: 2023-01-15 | Payer: BC Managed Care – PPO

## 2023-01-15 DIAGNOSIS — D4959 Neoplasm of unspecified behavior of other genitourinary organ: Secondary | ICD-10-CM

## 2023-01-15 DIAGNOSIS — G43909 Migraine, unspecified, not intractable, without status migrainosus: Secondary | ICD-10-CM

## 2023-01-15 DIAGNOSIS — K589 Irritable bowel syndrome without diarrhea: Secondary | ICD-10-CM

## 2023-01-15 DIAGNOSIS — I10 Essential (primary) hypertension: Secondary | ICD-10-CM

## 2023-01-15 DIAGNOSIS — K219 Gastro-esophageal reflux disease without esophagitis: Secondary | ICD-10-CM

## 2023-01-15 DIAGNOSIS — R42 Dizziness and giddiness: Secondary | ICD-10-CM

## 2023-01-15 DIAGNOSIS — R110 Nausea: Secondary | ICD-10-CM

## 2023-01-15 DIAGNOSIS — I6350 Cerebral infarction due to unspecified occlusion or stenosis of unspecified cerebral artery: Secondary | ICD-10-CM

## 2023-01-15 DIAGNOSIS — K7581 Nonalcoholic steatohepatitis (NASH): Secondary | ICD-10-CM

## 2023-01-15 DIAGNOSIS — I8290 Acute embolism and thrombosis of unspecified vein: Secondary | ICD-10-CM

## 2023-01-15 DIAGNOSIS — G4733 Obstructive sleep apnea (adult) (pediatric): Secondary | ICD-10-CM

## 2023-01-15 DIAGNOSIS — K56609 Unspecified intestinal obstruction, unspecified as to partial versus complete obstruction: Secondary | ICD-10-CM

## 2023-01-15 DIAGNOSIS — K5900 Constipation, unspecified: Secondary | ICD-10-CM

## 2023-01-15 DIAGNOSIS — K5732 Diverticulitis of large intestine without perforation or abscess without bleeding: Secondary | ICD-10-CM

## 2023-01-15 DIAGNOSIS — Z789 Other specified health status: Secondary | ICD-10-CM

## 2023-01-15 DIAGNOSIS — F419 Anxiety disorder, unspecified: Secondary | ICD-10-CM

## 2023-01-15 DIAGNOSIS — Z8619 Personal history of other infectious and parasitic diseases: Secondary | ICD-10-CM

## 2023-01-15 DIAGNOSIS — J984 Other disorders of lung: Secondary | ICD-10-CM

## 2023-01-15 DIAGNOSIS — H9190 Unspecified hearing loss, unspecified ear: Secondary | ICD-10-CM

## 2023-01-15 DIAGNOSIS — R011 Cardiac murmur, unspecified: Secondary | ICD-10-CM

## 2023-01-15 DIAGNOSIS — I2699 Other pulmonary embolism without acute cor pulmonale: Secondary | ICD-10-CM

## 2023-01-15 DIAGNOSIS — D699 Hemorrhagic condition, unspecified: Secondary | ICD-10-CM

## 2023-01-15 DIAGNOSIS — M2550 Pain in unspecified joint: Secondary | ICD-10-CM

## 2023-01-15 DIAGNOSIS — M797 Fibromyalgia: Secondary | ICD-10-CM

## 2023-01-15 DIAGNOSIS — I82409 Acute embolism and thrombosis of unspecified deep veins of unspecified lower extremity: Secondary | ICD-10-CM

## 2023-01-15 DIAGNOSIS — K639 Disease of intestine, unspecified: Secondary | ICD-10-CM

## 2023-01-15 DIAGNOSIS — G40909 Epilepsy, unspecified, not intractable, without status epilepticus: Secondary | ICD-10-CM

## 2023-01-15 DIAGNOSIS — K859 Acute pancreatitis without necrosis or infection, unspecified: Secondary | ICD-10-CM

## 2023-01-15 DIAGNOSIS — E139 Other specified diabetes mellitus without complications: Secondary | ICD-10-CM

## 2023-01-15 DIAGNOSIS — E119 Type 2 diabetes mellitus without complications: Secondary | ICD-10-CM

## 2023-01-15 DIAGNOSIS — N183 CKD (chronic kidney disease) stage 3, GFR 30-59 ml/min (HCC): Secondary | ICD-10-CM

## 2023-01-15 DIAGNOSIS — IMO0002 Ulcer: Secondary | ICD-10-CM

## 2023-01-15 DIAGNOSIS — J302 Other seasonal allergic rhinitis: Secondary | ICD-10-CM

## 2023-01-15 DIAGNOSIS — K5090 Crohn's disease, unspecified, without complications: Secondary | ICD-10-CM

## 2023-01-15 DIAGNOSIS — E785 Hyperlipidemia, unspecified: Secondary | ICD-10-CM

## 2023-01-15 DIAGNOSIS — F32A Depression: Secondary | ICD-10-CM

## 2023-01-15 NOTE — Assessment & Plan Note
Reviewed CPAP download. 30/30 days of usage.   Wearing 4 hours or greater 100% of nights. The average use on days used was 8 hours and 24 minutes.  Residual AHI 4.4.    The 95th percentile pressure was 13.4 cmH20, with a maximum of 14.4 cmH20, and a median of 11.1 cmH20.  The median leak was 1.8 L/min. She is tolerating and benefiting from therapy.     - Will continue current settings, CPAP 8-15.     - Keep in contact with DME for routine supplies, need to contact us directly if unable to communicate with DME in a timely manner.

## 2023-01-15 NOTE — Telephone Encounter
The patient's PAP compliance f/u office visit notes from Christie Beckers on 01/15/23 were faxed to their DME, [employer] on 01/15/23 at 1509 per right fax.  Jori Moll  .

## 2023-01-15 NOTE — Patient Instructions
If you have questions or concerns, please call my nurse at [phone] or feel free to send me a mychart message

## 2023-01-17 ENCOUNTER — Encounter: Admit: 2023-01-17 | Discharge: 2023-01-17 | Payer: BC Managed Care – PPO

## 2023-01-17 ENCOUNTER — Ambulatory Visit: Admit: 2023-01-17 | Discharge: 2023-01-18 | Payer: BC Managed Care – PPO

## 2023-01-17 DIAGNOSIS — K50918 Crohn's disease, unspecified, with other complication: Secondary | ICD-10-CM

## 2023-01-17 MED ORDER — ACETAMINOPHEN 500 MG PO TAB
500 mg | Freq: Once | ORAL | 0 refills | Status: CP
Start: 2023-01-17 — End: ?
  Administered 2023-01-17: 18:00:00 500 mg via ORAL

## 2023-01-17 MED ORDER — DIPHENHYDRAMINE HCL 50 MG/ML IJ SOLN
25 mg | Freq: Once | INTRAVENOUS | 0 refills | Status: CP | PRN
Start: 2023-01-17 — End: ?
  Administered 2023-01-17: 19:00:00 25 mg via INTRAVENOUS

## 2023-01-17 MED ORDER — ACETAMINOPHEN 500 MG PO TAB
500 mg | Freq: Once | ORAL | 0 refills
Start: 2023-01-17 — End: ?

## 2023-01-17 MED ORDER — DIPHENHYDRAMINE HCL 50 MG/ML IJ SOLN
25 mg | Freq: Once | INTRAVENOUS | 0 refills | Status: CP
Start: 2023-01-17 — End: ?
  Administered 2023-01-17: 18:00:00 25 mg via INTRAVENOUS

## 2023-01-17 MED ORDER — VEDOLIZUMAB IVPB
300 mg | Freq: Once | INTRAVENOUS | 0 refills | Status: CP
Start: 2023-01-17 — End: ?
  Administered 2023-01-17 (×2): 300 mg via INTRAVENOUS

## 2023-01-17 MED ORDER — VEDOLIZUMAB IVPB
300 mg | Freq: Once | INTRAVENOUS | 0 refills
Start: 2023-01-17 — End: ?

## 2023-01-17 MED ORDER — DIPHENHYDRAMINE HCL 50 MG/ML IJ SOLN
25 mg | Freq: Once | INTRAVENOUS | 0 refills
Start: 2023-01-17 — End: ?

## 2023-01-17 NOTE — Progress Notes
Pt tolerated Entyvio infusion without any sign of a reaction.

## 2023-01-20 ENCOUNTER — Encounter: Admit: 2023-01-20 | Discharge: 2023-01-20 | Payer: BC Managed Care – PPO

## 2023-01-24 ENCOUNTER — Encounter: Admit: 2023-01-24 | Discharge: 2023-01-24 | Payer: BC Managed Care – PPO

## 2023-01-24 DIAGNOSIS — E1122 Type 2 diabetes mellitus with diabetic chronic kidney disease: Secondary | ICD-10-CM

## 2023-01-26 ENCOUNTER — Encounter: Admit: 2023-01-26 | Discharge: 2023-01-26 | Payer: BC Managed Care – PPO

## 2023-01-26 MED FILL — DEXCOM G7 SENSOR MISC DEVI: 30 days supply | Qty: 3 | Fill #6 | Status: AC

## 2023-01-29 ENCOUNTER — Ambulatory Visit: Admit: 2023-01-29 | Discharge: 2023-01-30 | Payer: BC Managed Care – PPO

## 2023-01-29 ENCOUNTER — Encounter: Admit: 2023-01-29 | Discharge: 2023-01-29 | Payer: BC Managed Care – PPO

## 2023-01-29 DIAGNOSIS — K5732 Diverticulitis of large intestine without perforation or abscess without bleeding: Secondary | ICD-10-CM

## 2023-01-29 DIAGNOSIS — I2699 Other pulmonary embolism without acute cor pulmonale: Secondary | ICD-10-CM

## 2023-01-29 DIAGNOSIS — K859 Acute pancreatitis without necrosis or infection, unspecified: Secondary | ICD-10-CM

## 2023-01-29 DIAGNOSIS — Z8619 Personal history of other infectious and parasitic diseases: Secondary | ICD-10-CM

## 2023-01-29 DIAGNOSIS — F419 Anxiety disorder, unspecified: Secondary | ICD-10-CM

## 2023-01-29 DIAGNOSIS — R110 Nausea: Secondary | ICD-10-CM

## 2023-01-29 DIAGNOSIS — G43909 Migraine, unspecified, not intractable, without status migrainosus: Secondary | ICD-10-CM

## 2023-01-29 DIAGNOSIS — Z789 Other specified health status: Secondary | ICD-10-CM

## 2023-01-29 DIAGNOSIS — I6350 Cerebral infarction due to unspecified occlusion or stenosis of unspecified cerebral artery: Secondary | ICD-10-CM

## 2023-01-29 DIAGNOSIS — I8290 Acute embolism and thrombosis of unspecified vein: Secondary | ICD-10-CM

## 2023-01-29 DIAGNOSIS — R42 Dizziness and giddiness: Secondary | ICD-10-CM

## 2023-01-29 DIAGNOSIS — E785 Hyperlipidemia, unspecified: Secondary | ICD-10-CM

## 2023-01-29 DIAGNOSIS — K5090 Crohn's disease, unspecified, without complications: Secondary | ICD-10-CM

## 2023-01-29 DIAGNOSIS — E139 Other specified diabetes mellitus without complications: Secondary | ICD-10-CM

## 2023-01-29 DIAGNOSIS — K7581 Nonalcoholic steatohepatitis (NASH): Secondary | ICD-10-CM

## 2023-01-29 DIAGNOSIS — J984 Other disorders of lung: Secondary | ICD-10-CM

## 2023-01-29 DIAGNOSIS — I10 Essential (primary) hypertension: Secondary | ICD-10-CM

## 2023-01-29 DIAGNOSIS — K589 Irritable bowel syndrome without diarrhea: Secondary | ICD-10-CM

## 2023-01-29 DIAGNOSIS — K639 Disease of intestine, unspecified: Secondary | ICD-10-CM

## 2023-01-29 DIAGNOSIS — D4959 Neoplasm of unspecified behavior of other genitourinary organ: Secondary | ICD-10-CM

## 2023-01-29 DIAGNOSIS — K5900 Constipation, unspecified: Secondary | ICD-10-CM

## 2023-01-29 DIAGNOSIS — N183 CKD (chronic kidney disease) stage 3, GFR 30-59 ml/min (HCC): Secondary | ICD-10-CM

## 2023-01-29 DIAGNOSIS — E119 Type 2 diabetes mellitus without complications: Secondary | ICD-10-CM

## 2023-01-29 DIAGNOSIS — G40909 Epilepsy, unspecified, not intractable, without status epilepticus: Secondary | ICD-10-CM

## 2023-01-29 DIAGNOSIS — I82409 Acute embolism and thrombosis of unspecified deep veins of unspecified lower extremity: Secondary | ICD-10-CM

## 2023-01-29 DIAGNOSIS — M2550 Pain in unspecified joint: Secondary | ICD-10-CM

## 2023-01-29 DIAGNOSIS — D699 Hemorrhagic condition, unspecified: Secondary | ICD-10-CM

## 2023-01-29 DIAGNOSIS — E1165 Type 2 diabetes mellitus with hyperglycemia: Secondary | ICD-10-CM

## 2023-01-29 DIAGNOSIS — J302 Other seasonal allergic rhinitis: Secondary | ICD-10-CM

## 2023-01-29 DIAGNOSIS — F32A Depression: Secondary | ICD-10-CM

## 2023-01-29 DIAGNOSIS — IMO0002 Ulcer: Secondary | ICD-10-CM

## 2023-01-29 DIAGNOSIS — M797 Fibromyalgia: Secondary | ICD-10-CM

## 2023-01-29 DIAGNOSIS — K56609 Unspecified intestinal obstruction, unspecified as to partial versus complete obstruction: Secondary | ICD-10-CM

## 2023-01-29 DIAGNOSIS — E1122 Type 2 diabetes mellitus with diabetic chronic kidney disease: Secondary | ICD-10-CM

## 2023-01-29 DIAGNOSIS — R011 Cardiac murmur, unspecified: Secondary | ICD-10-CM

## 2023-01-29 DIAGNOSIS — K219 Gastro-esophageal reflux disease without esophagitis: Secondary | ICD-10-CM

## 2023-01-29 DIAGNOSIS — H9190 Unspecified hearing loss, unspecified ear: Secondary | ICD-10-CM

## 2023-01-29 MED ORDER — INSULIN DEGLUDEC 200 UNIT/ML (3 ML) SC INPN
36 [IU] | Freq: Every day | SUBCUTANEOUS | 3 refills | 30.00000 days | Status: AC
Start: 2023-01-29 — End: ?

## 2023-01-29 MED ORDER — INSULIN ASPART 100 UNIT/ML SC FLEXPEN
5-10 [IU] | Freq: Three times a day (TID) | SUBCUTANEOUS | 3 refills | 30.00000 days | Status: AC
Start: 2023-01-29 — End: ?

## 2023-01-29 NOTE — Patient Instructions
Our plan:   - Reduce Tresiba to 36 units daily   - Hold mounjaro until they figure out what's causing the abdominal pain   - Take novolog 5-10 units for higher carb meals     While doing prep:   - Reduce Tresiba to 30 units daily     My nurse, April, can be reached directly at [phone].     Please let us know if you have any questions.     Great to see you today!    - [name], PA-C

## 2023-01-30 DIAGNOSIS — N1831 Chronic kidney disease, stage 3a (HCC): Secondary | ICD-10-CM

## 2023-01-30 DIAGNOSIS — Z794 Long term (current) use of insulin: Secondary | ICD-10-CM

## 2023-02-05 ENCOUNTER — Encounter: Admit: 2023-02-05 | Discharge: 2023-02-05 | Payer: BC Managed Care – PPO

## 2023-02-18 ENCOUNTER — Encounter: Admit: 2023-02-18 | Discharge: 2023-02-18 | Payer: BC Managed Care – PPO

## 2023-02-20 ENCOUNTER — Encounter: Admit: 2023-02-20 | Discharge: 2023-02-20 | Payer: BC Managed Care – PPO

## 2023-02-20 ENCOUNTER — Ambulatory Visit: Admit: 2023-02-20 | Discharge: 2023-02-21 | Payer: BC Managed Care – PPO

## 2023-02-21 ENCOUNTER — Encounter: Admit: 2023-02-21 | Discharge: 2023-02-21 | Payer: BC Managed Care – PPO

## 2023-02-22 MED FILL — DEXCOM G7 SENSOR MISC DEVI: 30 days supply | Qty: 3 | Fill #7 | Status: AC

## 2023-03-03 ENCOUNTER — Encounter: Admit: 2023-03-03 | Discharge: 2023-03-03 | Payer: BC Managed Care – PPO

## 2023-03-07 ENCOUNTER — Encounter: Admit: 2023-03-07 | Discharge: 2023-03-07 | Payer: BC Managed Care – PPO

## 2023-03-14 ENCOUNTER — Ambulatory Visit: Admit: 2023-03-14 | Discharge: 2023-03-14 | Payer: BC Managed Care – PPO

## 2023-03-14 ENCOUNTER — Encounter: Admit: 2023-03-14 | Discharge: 2023-03-14 | Payer: BC Managed Care – PPO

## 2023-03-14 DIAGNOSIS — K50918 Crohn's disease, unspecified, with other complication: Secondary | ICD-10-CM

## 2023-03-14 MED ORDER — DIPHENHYDRAMINE HCL 50 MG/ML IJ SOLN
25 mg | Freq: Once | INTRAVENOUS | 0 refills | Status: CP | PRN
Start: 2023-03-14 — End: ?
  Administered 2023-03-14: 20:00:00 25 mg via INTRAVENOUS

## 2023-03-14 MED ORDER — DIPHENHYDRAMINE HCL 50 MG/ML IJ SOLN
25 mg | Freq: Once | INTRAVENOUS | 0 refills | Status: CP
Start: 2023-03-14 — End: ?
  Administered 2023-03-14: 19:00:00 25 mg via INTRAVENOUS

## 2023-03-14 MED ORDER — DIPHENHYDRAMINE HCL 50 MG/ML IJ SOLN
25 mg | Freq: Once | INTRAVENOUS | 0 refills
Start: 2023-03-14 — End: ?

## 2023-03-14 MED ORDER — VEDOLIZUMAB IVPB
300 mg | Freq: Once | INTRAVENOUS | 0 refills
Start: 2023-03-14 — End: ?

## 2023-03-14 MED ORDER — VEDOLIZUMAB IVPB
300 mg | Freq: Once | INTRAVENOUS | 0 refills | Status: CP
Start: 2023-03-14 — End: ?
  Administered 2023-03-14 (×2): 300 mg via INTRAVENOUS

## 2023-03-14 MED ORDER — ACETAMINOPHEN 500 MG PO TAB
500 mg | Freq: Once | ORAL | 0 refills | Status: CP
Start: 2023-03-14 — End: ?
  Administered 2023-03-14: 19:00:00 500 mg via ORAL

## 2023-03-14 MED ORDER — ACETAMINOPHEN 500 MG PO TAB
500 mg | Freq: Once | ORAL | 0 refills
Start: 2023-03-14 — End: ?

## 2023-03-14 NOTE — Progress Notes
~  1415: Infusion completed and flushed. See e-mar for administration details and Post infusion vitals completed. No concern for infusion reaction. Patient has no complaints. PIV discontinued. Patient declined printed AVS. Prompted patient to schedule upcoming appointment(s) at infusion checkout or call infusion scheduling. Patient verbalized understanding. Patient ambulated off unit.     Vitals:    03/14/23 1244 03/14/23 1412   BP: 105/66 92/62   BP Source: Arm, Left Upper Arm, Left Lower   Pulse: 68 69   Temp: 36.4 ?C (97.5 ?F)    SpO2: 100% 99%   Weight: 86.8 kg (191 lb 6.4 oz)

## 2023-03-15 LAB — CBC AND DIFF
~~LOC~~ BKR ABSOLUTE BASO COUNT: 0.1 10*3/uL (ref 0.00–0.20)
~~LOC~~ BKR ABSOLUTE LYMPH COUNT: 1.6 10*3/uL (ref 1.00–4.80)
~~LOC~~ BKR BASOPHILS %: 1 % (ref 0–2)
~~LOC~~ BKR HEMOGLOBIN: 12 g/dL (ref 12.0–15.0)
~~LOC~~ BKR LYMPHOCYTES %: 15 % — ABNORMAL LOW (ref 24–44)
~~LOC~~ BKR MCH: 30 pg (ref 26.0–34.0)
~~LOC~~ BKR MCHC: 34 g/dL (ref 32.0–36.0)
~~LOC~~ BKR MCV: 89 fL — ABNORMAL LOW (ref 80.0–100.0)
~~LOC~~ BKR NEUTROPHILS %: 76 % (ref 41–77)
~~LOC~~ BKR PLATELET COUNT: 263 10*3/uL — ABNORMAL HIGH (ref 150–400)
~~LOC~~ BKR RBC COUNT: 4 10*6/uL (ref 4.00–5.00)
~~LOC~~ BKR WBC COUNT: 10 10*3/uL (ref 4.50–11.00)

## 2023-03-15 LAB — COMPREHENSIVE METABOLIC PANEL
~~LOC~~ BKR ALT: 13 U/L — ABNORMAL HIGH (ref 7–56)
~~LOC~~ BKR ANION GAP: 12 10*3/uL (ref 3–12)
~~LOC~~ BKR BLD UREA NITROGEN: 45 mg/dL — ABNORMAL HIGH (ref 7–25)
~~LOC~~ BKR CALCIUM: 9.3 mg/dL (ref 8.5–10.6)
~~LOC~~ BKR GLOMERULAR FILTRATION RATE (GFR): 20 mL/min — ABNORMAL LOW (ref >60–0.45)
~~LOC~~ BKR SODIUM, SERUM: 138 mmol/L (ref 137–147)

## 2023-03-17 ENCOUNTER — Encounter: Admit: 2023-03-17 | Discharge: 2023-03-17 | Payer: BC Managed Care – PPO

## 2023-03-17 DIAGNOSIS — K5080 Crohn's disease of both small and large intestine without complications: Secondary | ICD-10-CM

## 2023-03-17 DIAGNOSIS — R899 Unspecified abnormal finding in specimens from other organs, systems and tissues: Secondary | ICD-10-CM

## 2023-03-17 NOTE — Telephone Encounter
Buckles, Vinnie Level, MD  You2 hours ago (10:27 AM)       Why don't we recheck BMP in 2 weeks to see if it goes back to baseline?

## 2023-03-19 ENCOUNTER — Encounter: Admit: 2023-03-19 | Discharge: 2023-03-19 | Payer: BC Managed Care – PPO

## 2023-03-19 DIAGNOSIS — K649 Unspecified hemorrhoids: Secondary | ICD-10-CM

## 2023-03-19 MED ORDER — HYDROCORTISONE-PRAMOXINE 1-1 % RE CREA
1 g | Freq: Two times a day (BID) | RECTAL | 1 refills | 50.00000 days | Status: AC
Start: 2023-03-19 — End: ?

## 2023-03-24 ENCOUNTER — Encounter: Admit: 2023-03-24 | Discharge: 2023-03-24 | Payer: BC Managed Care – PPO

## 2023-03-24 DIAGNOSIS — K21 Gastroesophageal reflux disease with esophagitis without hemorrhage: Secondary | ICD-10-CM

## 2023-03-24 MED ORDER — DEXLANSOPRAZOLE 60 MG PO CPDB
60 mg | ORAL_CAPSULE | Freq: Every day | ORAL | 3 refills | 30.00000 days | Status: AC
Start: 2023-03-24 — End: ?

## 2023-03-24 NOTE — Telephone Encounter
All Protocol Elements met  Medication name    dexlansoprazole (DEXILANT) 60 mg capsule     Sig: Take one capsule by mouth daily. Indications: gastroesophageal reflux disease    Disp: 90 capsule    Refills: 3         Patient was last seen on 01/07/23 and has follow up scheduled on 05/20/23.

## 2023-03-25 ENCOUNTER — Encounter: Admit: 2023-03-25 | Discharge: 2023-03-25 | Payer: BC Managed Care – PPO

## 2023-03-26 ENCOUNTER — Encounter: Admit: 2023-03-26 | Discharge: 2023-03-26 | Payer: BC Managed Care – PPO

## 2023-03-26 NOTE — Telephone Encounter
03/26/2023 9:53 AM   Prior authorization submitted for dexlansoprazole

## 2023-03-27 ENCOUNTER — Encounter: Admit: 2023-03-27 | Discharge: 2023-03-27 | Payer: BC Managed Care – PPO

## 2023-03-27 NOTE — Telephone Encounter
Approval received:        Notified [place] by VM.  Notified patient by VM.

## 2023-03-27 NOTE — Telephone Encounter
Received fax from [facility] pharmacy needing pa for dexcom g7 sensors    Initiated pa through prompt pa  Prior Auth (EOC) ID: [id number] Drug/Service Name: Elmer Picker SENSOR  Patient: Tina Snyder Date Requested: 03/27/2023 10:23:05 AM    MemberID: [id number] DOB: 07-27-1965

## 2023-03-29 ENCOUNTER — Encounter: Admit: 2023-03-29 | Discharge: 2023-03-29 | Payer: BC Managed Care – PPO

## 2023-03-30 MED FILL — DEXCOM G7 SENSOR MISC DEVI: 30 days supply | Qty: 3 | Fill #8 | Status: AC

## 2023-04-02 ENCOUNTER — Encounter: Admit: 2023-04-02 | Discharge: 2023-04-02 | Payer: BC Managed Care – PPO

## 2023-04-02 DIAGNOSIS — K50018 Crohn's disease of small intestine with other complication: Secondary | ICD-10-CM

## 2023-04-02 MED ORDER — NEBIVOLOL 10 MG PO TAB
10 mg | ORAL_TABLET | Freq: Every day | ORAL | 1 refills | 60.00000 days | Status: AC
Start: 2023-04-02 — End: ?

## 2023-04-02 MED ORDER — BUDESONIDE 3 MG PO CECX
ORAL_CAPSULE | ORAL | 1 refills | 30.00000 days | Status: AC
Start: 2023-04-02 — End: ?

## 2023-04-02 NOTE — Telephone Encounter
Requested Prescriptions   Pending Prescriptions Disp Refills    nebivoloL (BYSTOLIC) 10 mg tablet [Pharmacy Med Name: NEBIVOLOL 10 MG TABLET] 90 tablet 1     Sig: TAKE 1 TABLET BY MOUTH EVERY DAY       Cardiovascular: Beta Blockers 3 Failed - 04/02/2023  8:10 AM        Failed - Cr in normal range and within 180 days     Creatinine   Date Value Ref Range Status   03/14/2023 2.76 (H) 0.40 - 1.00 mg/dL Final   47/82/9562 1.30 (H) 0.4 - 1.00 MG/DL Final              Failed - eGFR in normal range and within 180 days     eGFR Non African American   Date Value Ref Range Status   03/23/2020 43 (L) >60 mL/min Final     Comment:     The eGFR is not validated for use in drug dosing adjustments.  Continue to use   estimated creatinine clearance per dosing reference text.  Please contact the   Clinical Pharmacist for questions.       Glomerular Filtration Rate (GFR)   Date Value Ref Range Status   03/14/2023 20 (L) >60 mL/min Final     eGFR African American   Date Value Ref Range Status   03/23/2020 52 (L) >60 mL/min Final     Comment:     The eGFR is not validated for use in drug dosing adjustments.  Continue to use   estimated creatinine clearance per dosing reference text.  Please contact the   Clinical Pharmacist for questions.     03/09/2020 46 (L) >60 mL/min Final     Comment:     The eGFR is not validated for use in drug dosing adjustments.  Continue to use   estimated creatinine clearance per dosing reference text.  Please contact the   Clinical Pharmacist for questions.       eGFR   Date Value Ref Range Status   01/17/2023 29 (L) >60 mL/min Final     Comment:     eGFR calculated using the CKD-EPIcr_R equation   01/07/2023 30 (L) >60 mL/min Final     Comment:     eGFR calculated using the CKD-EPIcr_R equation              Passed - Valid encounter within last 12 months     Recent Visits  Date Type Provider Dept   02/20/23 Office Visit Baruch Gouty, MD [facility]   08/28/22 Office Visit Annia Belt, Becky Sax, MD [facility]   06/12/22 Office Visit Brubacher, Becky Sax, MD [facility]   05/15/22 Office Visit Brubacher, Becky Sax, MD [facility]   10/11/21 Office Visit Brubacher, Becky Sax, MD [facility]   05/17/21 Office Visit Telehealth Kallie Edward, APRN-NP [facility]   Showing recent visits within past 720 days and meeting all other requirements  Future Appointments  No visits were found meeting these conditions.  Showing future appointments within next 360 days and meeting all other requirements            Passed - BP completed in the last 12 months     BP Readings from Last 2 Encounters:   03/14/23 92/62   02/20/23 120/74  Passed - Heart Rate completed in the last 12 months     Pulse Readings from Last 2 Encounters:   03/14/23 69   02/20/23 64

## 2023-04-02 NOTE — Telephone Encounter
Refill request received for:    budesonide (ENTOCORT EC) 3 mg capsule     Sig: TAKE 2 CAPSULES BY MOUTH EVERY DAY     Last OV: 01/07/23  Last fill: 12/30/22    Routing to Dr. Janene Madeira for approval/refusal

## 2023-04-08 ENCOUNTER — Encounter: Admit: 2023-04-08 | Discharge: 2023-04-08 | Payer: BC Managed Care – PPO

## 2023-04-09 ENCOUNTER — Encounter: Admit: 2023-04-09 | Discharge: 2023-04-09 | Payer: BC Managed Care – PPO

## 2023-04-09 NOTE — Telephone Encounter
Received fax from [facility] pharmacy needing pa for mounjaro 2.5, initiated pa through prompt pa  Prior Auth (EOC) ID: [id number] Drug/Service Name: Greggory Keen 2.5 MG/0.5 ML PEN  Patient: Tina Snyder Date Requested: 04/09/2023 10:00:15 AM    MemberID: [id number] DOB: February 24, 1966

## 2023-04-09 NOTE — Telephone Encounter
Approval received:        Notified [facility] by VM. Closing encounter.

## 2023-04-30 ENCOUNTER — Encounter: Admit: 2023-04-30 | Discharge: 2023-04-30 | Payer: BC Managed Care – PPO

## 2023-05-01 ENCOUNTER — Encounter: Admit: 2023-05-01 | Discharge: 2023-05-01 | Payer: BC Managed Care – PPO

## 2023-05-01 MED FILL — DEXCOM G7 SENSOR MISC DEVI: 30 days supply | Qty: 3 | Fill #9 | Status: AC

## 2023-05-09 ENCOUNTER — Encounter: Admit: 2023-05-09 | Discharge: 2023-05-09 | Payer: BC Managed Care – PPO

## 2023-05-09 ENCOUNTER — Ambulatory Visit: Admit: 2023-05-09 | Discharge: 2023-05-09 | Payer: BC Managed Care – PPO

## 2023-05-09 LAB — CBC AND DIFF
~~LOC~~ BKR ABSOLUTE BASO COUNT: 0 10*3/uL (ref 0.00–0.20)
~~LOC~~ BKR ABSOLUTE EOS COUNT: 0.1 10*3/uL (ref 0.00–0.45)
~~LOC~~ BKR ABSOLUTE MONO COUNT: 0.5 10*3/uL (ref 0.00–0.80)
~~LOC~~ BKR MCV: 89 fL (ref 80.0–100.0)
~~LOC~~ BKR RBC COUNT: 3.5 10*6/uL — ABNORMAL LOW (ref 4.00–5.00)
~~LOC~~ BKR WBC COUNT: 9.1 10*3/uL (ref 4.50–11.00)

## 2023-05-09 MED ORDER — DIPHENHYDRAMINE HCL 50 MG/ML IJ SOLN
25 mg | Freq: Once | INTRAVENOUS | 0 refills
Start: 2023-05-09 — End: ?

## 2023-05-09 MED ORDER — VEDOLIZUMAB IVPB
300 mg | Freq: Once | INTRAVENOUS | 0 refills
Start: 2023-05-09 — End: ?

## 2023-05-09 MED ORDER — DIPHENHYDRAMINE HCL 50 MG/ML IJ SOLN
25 mg | Freq: Once | INTRAVENOUS | 0 refills | Status: CP | PRN
Start: 2023-05-09 — End: ?
  Administered 2023-05-09: 21:00:00 25 mg via INTRAVENOUS

## 2023-05-09 MED ORDER — ACETAMINOPHEN 500 MG PO TAB
500 mg | Freq: Once | ORAL | 0 refills | Status: CP
Start: 2023-05-09 — End: ?
  Administered 2023-05-09: 20:00:00 500 mg via ORAL

## 2023-05-09 MED ORDER — ACETAMINOPHEN 500 MG PO TAB
500 mg | Freq: Once | ORAL | 0 refills
Start: 2023-05-09 — End: ?

## 2023-05-09 MED ORDER — DIPHENHYDRAMINE HCL 50 MG/ML IJ SOLN
25 mg | Freq: Once | INTRAVENOUS | 0 refills | Status: CP
Start: 2023-05-09 — End: ?
  Administered 2023-05-09: 20:00:00 25 mg via INTRAVENOUS

## 2023-05-09 MED ORDER — VEDOLIZUMAB IVPB
300 mg | Freq: Once | INTRAVENOUS | 0 refills | Status: CP
Start: 2023-05-09 — End: ?
  Administered 2023-05-09 (×2): 300 mg via INTRAVENOUS

## 2023-05-09 NOTE — Progress Notes
 Pt tolerated Entyvio infusion without any sign of a reaction.

## 2023-05-10 ENCOUNTER — Encounter: Admit: 2023-05-10 | Discharge: 2023-05-10 | Payer: BC Managed Care – PPO

## 2023-05-10 DIAGNOSIS — K50918 Crohn's disease, unspecified, with other complication: Secondary | ICD-10-CM

## 2023-05-10 LAB — COMPREHENSIVE METABOLIC PANEL
~~LOC~~ BKR ALBUMIN: 4.1 g/dL (ref 3.5–5.0)
~~LOC~~ BKR ALK PHOSPHATASE: 99 U/L — ABNORMAL LOW (ref 25–110)
~~LOC~~ BKR ALT: 13 U/L (ref 7–56)
~~LOC~~ BKR ANION GAP: 8 10*3/uL (ref 3–12)
~~LOC~~ BKR AST: 17 U/L (ref 7–40)
~~LOC~~ BKR BLD UREA NITROGEN: 29 mg/dL — ABNORMAL HIGH (ref 7–25)
~~LOC~~ BKR CALCIUM: 9.4 mg/dL (ref 8.5–10.6)
~~LOC~~ BKR CHLORIDE: 105 mmol/L — ABNORMAL LOW (ref 98–110)
~~LOC~~ BKR CO2: 27 mmol/L (ref 21–30)
~~LOC~~ BKR CREATININE: 1.9 mg/dL — ABNORMAL HIGH (ref 0.40–1.00)
~~LOC~~ BKR GLOMERULAR FILTRATION RATE (GFR): 29 mL/min — ABNORMAL LOW (ref >60–4.80)
~~LOC~~ BKR POTASSIUM: 3.8 mmol/L — ABNORMAL LOW (ref 3.5–5.1)
~~LOC~~ BKR TOTAL BILIRUBIN: 0.4 mg/dL (ref 0.2–1.3)
~~LOC~~ BKR TOTAL PROTEIN: 6.5 g/dL (ref 6.0–8.0)

## 2023-05-20 ENCOUNTER — Encounter: Admit: 2023-05-20 | Discharge: 2023-05-20 | Payer: BC Managed Care – PPO

## 2023-05-20 ENCOUNTER — Ambulatory Visit: Admit: 2023-05-20 | Discharge: 2023-05-21 | Payer: BC Managed Care – PPO

## 2023-05-21 ENCOUNTER — Encounter: Admit: 2023-05-21 | Discharge: 2023-05-21 | Payer: BC Managed Care – PPO

## 2023-05-21 DIAGNOSIS — I10 Essential (primary) hypertension: Secondary | ICD-10-CM

## 2023-05-21 MED ORDER — AMLODIPINE 5 MG PO TAB
5 mg | ORAL_TABLET | Freq: Every day | ORAL | 1 refills | Status: AC
Start: 2023-05-21 — End: ?

## 2023-05-23 ENCOUNTER — Encounter: Admit: 2023-05-23 | Discharge: 2023-05-23 | Payer: BC Managed Care – PPO

## 2023-05-26 ENCOUNTER — Encounter: Admit: 2023-05-26 | Discharge: 2023-05-26 | Payer: BC Managed Care – PPO

## 2023-05-27 MED FILL — DEXCOM G7 SENSOR MISC DEVI: 30 days supply | Qty: 3 | Fill #10 | Status: AC

## 2023-05-29 ENCOUNTER — Encounter: Admit: 2023-05-29 | Discharge: 2023-05-29 | Payer: BC Managed Care – PPO

## 2023-05-29 DIAGNOSIS — E1122 Type 2 diabetes mellitus with diabetic chronic kidney disease: Secondary | ICD-10-CM

## 2023-05-30 ENCOUNTER — Encounter: Admit: 2023-05-30 | Discharge: 2023-05-30 | Payer: BC Managed Care – PPO

## 2023-05-30 ENCOUNTER — Ambulatory Visit: Admit: 2023-05-30 | Discharge: 2023-05-31 | Payer: BC Managed Care – PPO

## 2023-06-02 ENCOUNTER — Encounter: Admit: 2023-06-02 | Discharge: 2023-06-02 | Payer: BC Managed Care – PPO

## 2023-06-10 ENCOUNTER — Encounter: Admit: 2023-06-10 | Discharge: 2023-06-10 | Payer: BC Managed Care – PPO

## 2023-06-10 ENCOUNTER — Ambulatory Visit: Admit: 2023-06-10 | Discharge: 2023-06-10 | Payer: BC Managed Care – PPO

## 2023-06-10 MED ORDER — LIDOCAINE (PF) 200 MG/10 ML (2 %) IJ SYRG
INTRAVENOUS | 0 refills | Status: DC
Start: 2023-06-10 — End: 2023-06-10

## 2023-06-10 MED ORDER — DEXAMETHASONE SODIUM PHOSPHATE 4 MG/ML IJ SOLN
INTRAVENOUS | 0 refills | Status: DC
Start: 2023-06-10 — End: 2023-06-10

## 2023-06-10 MED ORDER — PROPOFOL INJ 10 MG/ML IV VIAL
INTRAVENOUS | 0 refills | Status: DC
Start: 2023-06-10 — End: 2023-06-10

## 2023-06-10 MED ORDER — LACTATED RINGERS IV SOLP
INTRAVENOUS | 0 refills | Status: DC
Start: 2023-06-10 — End: 2023-06-10

## 2023-06-10 MED ORDER — SUCCINYLCHOLINE CHLORIDE 20 MG/ML IJ SOLN
INTRAVENOUS | 0 refills | Status: DC
Start: 2023-06-10 — End: 2023-06-10

## 2023-06-10 MED ADMIN — ONDANSETRON HCL (PF) 4 MG/2 ML IJ SOLN [136012]: 4 mg | INTRAVENOUS | @ 15:00:00 | Stop: 2023-06-10 | NDC 60505613000

## 2023-06-10 NOTE — Anesthesia Post-Procedure Evaluation
 Post-Anesthesia Evaluation    Name: Tina Snyder      MRN: [id number]     DOB: February 17, 1966     Age: 58 y.o.     Sex: female   __________________________________________________________________________     Procedure Information       Anesthesia Start Date/Time: 06/10/23 0914    Procedure: COLONOSCOPY WITH BIOPSY - FLEXIBLE    Location: ENDO 4 / ENDO/GI    Surgeons: Buckles, Vinnie Level, MD            Post-Anesthesia Vitals  BP: 104/78 (02/18 1010)  Temp: 36.6 ?C (97.8 ?F) (02/18 9562)  Pulse: 64 (02/18 1010)  Respirations: 18 PER MINUTE (02/18 1010)  SpO2: 100 % (02/18 1010)  O2 Device: None (Room air) (02/18 1010)   Vitals Value Taken Time   BP 104/78 06/10/23 1010   Temp     Pulse 64 06/10/23 1010   Respirations 18 PER MINUTE 06/10/23 1010   SpO2 100 % 06/10/23 1010   O2 Device None (Room air) 06/10/23 1010   ABP     ART BP           Post Anesthesia Evaluation Note    Evaluation location: Pre/Post  Patient participation: recovered; patient participated in evaluation  Level of consciousness: alert  Pain management: adequate    Hydration: normovolemia  Temperature: 36.0?C - 38.4?C  Airway patency: adequate    Perioperative Events       Post-op nausea and vomiting: no PONV    Postoperative Status  Cardiovascular status: hemodynamically stable  Respiratory status: spontaneous ventilation  Follow-up needed: none        Perioperative Events  There were no known complications for this encounter.

## 2023-06-10 NOTE — Anesthesia Pre-Procedure Evaluation
 Anesthesia Pre-Procedure Evaluation    Name: Tina Snyder      MRN: [id number]     DOB: [birth year]     Age: 58 y.o.     Sex: female   _________________________________________________________________________     Procedure Info:   Procedure Information       Date/Time: 06/10/23 0935    Procedure: COLONOSCOPY WITH BIOPSY - FLEXIBLE    Location: ENDO 4 / ENDO/GI    Surgeons: Buckles, Vinnie Level, MD            Physical Assessment  Vital Signs (last filed in past 24 hours):  BP: 127/89 (02/18 0814)  Temp: 36.7 ?C (98.1 ?F) (02/18 5956)  Pulse: 66 (02/18 0814)  Respirations: 13 PER MINUTE (02/18 0814)  SpO2: 100 % (02/18 0814)  O2 Device: None (Room air) (02/18 0814)  Height: 161.8 cm (5' 3.7) (02/18 3875)  Weight: 83.5 kg (184 lb) (02/18 0814)     Glucose, POC   Date/Time Value Ref Range Status   06/10/2023 0811 96 70 - 100 mg/dL Final   64/33/2951 8841 126 (A) 70 - 100 Final      Patient History   Allergies   Allergen Reactions    Bee Venom Protein (Honey Bee) ANAPHYLAXIS    Ciprofloxacin VOMITING    Enoxaparin SEE COMMENTS and UNKNOWN     Hemorrhage    Morphine HALLUCINATIONS and ANAPHYLAXIS     States she quits breathing    Warfarin SEE COMMENTS and UNKNOWN     Hemorrhage    Metoclopramide SEE COMMENTS and RASH     Patient states that she cannot tolerate.    Metronidazole HIVES    Bee [Bumble Bee] UNKNOWN    Ciprofloxacin NAUSEA AND VOMITING    Stadol [Butorphanol Tartrate] SEE COMMENTS     Patient states that it made her loopy and drunk.    Tysabri [Natalizumab] SEE COMMENTS     JC virus positive.  Do not use Tysabri - high risk for PML    Walnut UNKNOWN    Zyrtec [Cetirizine] UNKNOWN    Butorphanol UNKNOWN    Farxiga [Dapagliflozin] SEE COMMENTS     Recurrent yeast infections    Pecan Nut UNKNOWN    Prochlorperazine HIVES, SEE COMMENTS and UNKNOWN     Tolerates Phenergan    Sulfa (Sulfonamide Antibiotics) HIVES, NAUSEA AND VOMITING and UNKNOWN    Sulfasalazine UNKNOWN    Topiramate UNKNOWN        Current Medications    Medication Directions   amLODIPine (NORVASC) 5 mg tablet TAKE 1 TABLET BY MOUTH EVERY DAY   atorvastatin (LIPITOR) 40 mg tablet TAKE 1 TABLET BY MOUTH EVERY DAY   BD LUER-LOK SYRINGE 3 mL 25 x 5/8 FOR VITAMIN B12 INJECTIONS   budesonide (ENTOCORT EC) 3 mg capsule TAKE 2 CAPSULES BY MOUTH EVERY DAY   buPROPion XL (WELLBUTRIN XL) 300 mg tablet TAKE ONE TABLET BY MOUTH EVERY MORNING. DO NOT CRUSH OR CHEW.   chlorthalidone (HYGROTON) 25 mg tablet TAKE 1 TABLET BY MOUTH EVERY DAY   cyanocobalamin (vitamin B-12) (RUBRAMIN PC) 1,000 mcg/mL injection solution Inject 1 mL under the skin every 30 days.   cyclobenzaprine (FLEXERIL) 10 mg tablet TAKE ONE TABLET BY MOUTH AT BEDTIME DAILY.   DEXCOM G7 SENSOR sensor device Use one each as directed every 10 days. Indications: type 2 diabetes mellitus   dexlansoprazole (DEXILANT) 60 mg capsule TAKE ONE CAPSULE BY MOUTH DAILY. INDICATIONS: GASTROESOPHAGEAL REFLUX DISEASE   diclofenac sodium (VOLTAREN)  1 % topical gel Apply four g topically to affected area four times daily.   doxycycline hyclate (VIBRACIN) 100 mg tablet Take one tablet by mouth twice daily.   duloxetine DR (CYMBALTA) 60 mg capsule TAKE 1 CAPSULE BY MOUTH EVERY DAY   EPINEPHrine (AUVI-Q) 1 mg/mL injection pen (2-Pack) Inject 0.3 mg (1 Pen) into thigh if needed for anaphylactic reaction. May repeat in 5-15 minutes if needed.   famotidine (PEPCID) 40 mg tablet Take one tablet by mouth twice daily.   glucagon (BAQSIMI) 3 mg/actuation nasal spray Use 1 spray in single nostril. If no response, may repeat in 15 minutes using second device.   hydrOXYzine HCL (ATARAX) 25 mg tablet TAKE 1 TABLET BY MOUTH THREE TIMES A DAY   insulin aspart (U-100) (NOVOLOG FLEXPEN U-100 INSULIN) 100 unit/mL (3 mL) PEN Inject five Units to ten Units under the skin three times daily with meals.   insulin glargine (BASAGLAR KWIKPEN U-100 INSULIN) 100 unit/mL (3 mL) subcutaneous PEN Inject thirty six Units under the skin daily. insulin glargine (LANTUS SOLOSTAR U-100 INSULIN) 100 unit/mL (3 mL) subcutaneous PEN Inject forty two Units under the skin at bedtime daily.   levalbuterol tartrate (XOPENEX HFA) 45 mcg/actuation inhaler Inhale two puffs by mouth into the lungs every 4-6 hours as needed for Wheezing or Shortness of Breath.   lipase-protease-amylase (CREON) 36,000-114,000- 180,000 unit capsule Take two capsules by mouth three times daily with meals. Take one capsule with snacks   losartan (COZAAR) 100 mg tablet TAKE 1 TABLET BY MOUTH EVERY DAY   nebivoloL (BYSTOLIC) 10 mg tablet TAKE 1 TABLET BY MOUTH EVERY DAY   needle (disp) 25 gauge 25 gauge x 5/8 For Vitamin B12 injections   pantoprazole DR (PROTONIX) 40 mg tablet Take one tablet by mouth daily.   peg 3350-electrolytes (GOLYTELY) 236-22.74-6.74 -5.86 gram oral solution Mix as directed on package. Drink (8oz) every 10 minutes until gone. Refrigerate once mixed.   pen needle, diabetic (BD NANO 2ND GEN PEN NEEDLE) 32 gauge x 5/32 pen needle Use with insulin injections four times daily  Indications: e11.22   polyethylene glycol 3350 (MIRALAX) 17 gram/dose powder Take seventeen g by mouth twice daily.   pramoxine-hydrocortisone (ANALPRAM-HC) 1-1 % rectal cream Insert or Apply one g to rectal area as directed twice daily.   Syringe (Disposable) 3 mL For vitamin B12 injections   tirzepatide (MOUNJARO) 2.5 mg/0.5 mL injector PEN INJECT 0.5 ML UNDER THE SKIN EVERY 7 DAYS   vedolizumab (ENTYVIO IV) Administer  through vein every 8 weeks.   vonoprazan (VOQUEZNA) 10 mg tablet Take one tablet by mouth daily.       Review of Systems/Medical History      Patient summary reviewed  Pertinent labs reviewed    PONV Screening: Non-smoker, Postoperative opioids and Female sex    No history of anesthetic complications    No family history of anesthetic complications      Airway - negative        Pulmonary           No indications/hx of asthma (chronic cough)      Pneumonia (history of) Hx pulmonary embolus (2009)        No recent URI      Shortness of breath: alll the time; no differences with positioning.        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 facial droop left sided weakness, resolved.)      Headaches (Migraines, headache last two days)      Neuropathy (Fibromyalgia)      Weakness        Psychiatric history          Depression      Musculoskeletal         Back pain      Fractures      Generalized weakness        Endocrine/Other       Diabetes (on insulin since 2017), poorly controlled, type 2, using insulin      Most recent Hgb A1C:< 7.2        Anemia      Blood dyscrasia      History of blood transfusion        Adrenal insufficiency (prednisone for pancreatitis and Crohn's)      Autoimmune disease (Crohn's)      Obesity        Has CGM    Constitution - negative       Physical Exam    Airway Findings      Mallampati: III      TM distance: >3 FB      Neck ROM: full      Mouth opening: good    Dental Findings:         Comments: 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     Cardiovascular Findings:       Rhythm: regular      Rate: normal    Abdominal Findings:       Obese    Neurological Findings: Alert and oriented x 3    Normal mental status    Constitutional findings:       No acute distress       Previous Airway Procedure Notes Displaying the 3 most recent records   No records found.         Patient Lines/Drains/Airways Status       Active Lines:       Name Placement date Placement time Site Days    Peripheral IV 06/10/23 0814 Right Anterior Forearm 22 G 06/10/23  0814  --  less than 1                  Diagnostic Tests  Hematology:   Lab Results   Component Value Date    HGB 11.0 (L) 05/09/2023    HCT 31.8 (L) 05/09/2023    PLTCT 232 05/09/2023    WBC 9.10 05/09/2023    NEUT 73 05/09/2023    ANC 6.70 05/09/2023    LYMPH 8 (L) 01/13/2020    ALC 1.80 05/09/2023    MONA 6 05/09/2023    AMC 0.50 05/09/2023    EOSA 1 05/09/2023    ABC 0.00 05/09/2023    MCV 89.0 05/09/2023    MCH 30.8 05/09/2023    MCHC 34.6 05/09/2023    MPV 8.7 05/09/2023    RDW 14.9 05/09/2023         General Chemistry:   Lab Results   Component Value Date    NA 140 05/09/2023    K 3.8 05/09/2023    CL 105 05/09/2023    CO2 27 05/09/2023    GAP 8 05/09/2023    BUN 29 (H) 05/09/2023    CR 1.96 (H) 05/09/2023    GLU 74 05/09/2023    CA 9.4 05/09/2023    ALBUMIN 4.1 05/09/2023    LACTIC 1.7 04/10/2018    OBSCA 1.22 10/25/2010    MG 2.0 12/16/2019    TOTBILI 0.4 05/09/2023    PO4 3.4 11/03/2020      Coagulation:   Lab Results   Component Value Date    PTT 26.5 03/02/2019    INR 1.0 03/02/2019       PAC Plan    Anesthesia Plan    ASA score: 3   Plan: general and RSI  NPO status: acceptable      Informed Consent  Anesthetic plan and risks discussed with patient.        Plan discussed with: anesthesiologist and CRNA.  Comments: (0930)      Alerts: No

## 2023-06-10 NOTE — Anesthesia Procedure Notes
 Procedure: Airway Placement    AIRWAY INSERTION    Date/Time: 06/10/2023 9:20 AM    Patient location: OR  Urgency: elective  Difficult Airway: No            Airway Procedure  Indication(s) for airway management: surgery        Rapid Sequence Induction (RSI) used: yes    Preoxygenated: yes  Patient position: sniffing    Mask difficulty assessment: 0 - not attempted      Procedure Outcome  Final airway type: endotracheal airway  Endotracheal airway: ETT          ETT size (mm): 7.0  Technique used for successful ETT placement: direct laryngoscopy  Devices/methods used in placement: intubating stylet  Insertion site: oral  Blade type: Miller   Laryngoscope/Videolaryngoscope blade size: 2  Cormack-Lehane classification: grade I - full view of glottis      Measured from: lips   Depth: 22 cm  Amount of Air in Cuff: 10 ml  Number of attempts at approach: 1  Placement verified by auscultation and capnometry                Performed by: Tomasita Morrow, CRNA  Authorized by: Stephanie Acre, DO

## 2023-06-11 ENCOUNTER — Encounter: Admit: 2023-06-11 | Discharge: 2023-06-11 | Payer: BC Managed Care – PPO

## 2023-06-11 MED ORDER — USTEKINUMAB IVPB
390 mg | Freq: Once | INTRAVENOUS | 0 refills
Start: 2023-06-11 — End: ?

## 2023-06-12 ENCOUNTER — Encounter: Admit: 2023-06-12 | Discharge: 2023-06-12 | Payer: BC Managed Care – PPO

## 2023-06-12 NOTE — Progress Notes
 Pharmacy Benefits Investigation    Medication name: ustekinumab (STELARA) 90 mg syringe  Medication status: new    The insurance requires a prior authorization for the medication. The prior authorization was submitted via [employer]. Will follow up in 2 business days.    EOC ID: [id number]    Thornell Sartorius  Specialty Pharmacy Patient Advocate

## 2023-06-13 ENCOUNTER — Encounter: Admit: 2023-06-13 | Discharge: 2023-06-13 | Payer: BC Managed Care – PPO

## 2023-06-15 ENCOUNTER — Encounter: Admit: 2023-06-15 | Discharge: 2023-06-15 | Payer: BC Managed Care – PPO

## 2023-06-16 ENCOUNTER — Encounter: Admit: 2023-06-16 | Discharge: 2023-06-16 | Payer: BC Managed Care – PPO

## 2023-06-17 ENCOUNTER — Encounter: Admit: 2023-06-17 | Discharge: 2023-06-17 | Payer: BC Managed Care – PPO

## 2023-06-17 NOTE — Progress Notes
 Pharmacy Benefits Investigation    Medication name: ustekinumab (STELARA) 90 mg syringe  Medication status: new    The prior authorization was approved for Giana Castner from 06/16/2023 through 12/13/2023.    The out of pocket cost today is $0. This cost may change due to factors including but not limited to changes in insurance coverage.    The copay is affordable. The patient requires education from the ambulatory pharmacist prior to the medication being dispensed. The pharmacist was notified. Will await notification from the pharmacist that it is okay to dispense the medication per the patient's preference.    Thornell Sartorius  Specialty Pharmacy Patient Advocate

## 2023-06-23 ENCOUNTER — Encounter: Admit: 2023-06-23 | Discharge: 2023-06-23 | Payer: BC Managed Care – PPO

## 2023-06-27 ENCOUNTER — Encounter: Admit: 2023-06-27 | Discharge: 2023-06-27 | Payer: BC Managed Care – PPO

## 2023-06-27 DIAGNOSIS — R1114 Bilious vomiting: Secondary | ICD-10-CM

## 2023-06-27 MED ORDER — ONDANSETRON 4 MG PO TBDI
4 mg | ORAL_TABLET | ORAL | 1 refills | 8.00000 days | Status: AC | PRN
Start: 2023-06-27 — End: ?

## 2023-06-27 NOTE — Telephone Encounter
 06/27/2023 5:58 PM   Dr. Janene Madeira called patient and discussed plan over the phone with patient.

## 2023-06-30 ENCOUNTER — Inpatient Hospital Stay: Admit: 2023-06-30 | Discharge: 2023-06-30 | Payer: BC Managed Care – PPO

## 2023-06-30 ENCOUNTER — Encounter: Admit: 2023-06-30 | Discharge: 2023-06-30 | Payer: BC Managed Care – PPO

## 2023-06-30 DIAGNOSIS — R112 Nausea with vomiting, unspecified: Secondary | ICD-10-CM

## 2023-06-30 MED ORDER — DEXTROSE 50 % IN WATER (D50W) IV SYRG
12.5-25 g | INTRAVENOUS | 0 refills | Status: DC | PRN
Start: 2023-06-30 — End: 2023-07-03

## 2023-06-30 MED ORDER — ONDANSETRON HCL (PF) 4 MG/2 ML IJ SOLN
8 mg | INTRAVENOUS | 0 refills | Status: DC | PRN
Start: 2023-06-30 — End: 2023-07-03
  Administered 2023-07-01 – 2023-07-02 (×5): 8 mg via INTRAVENOUS

## 2023-06-30 MED ORDER — POTASSIUM CHLORIDE 20 MEQ PO TBTQ
40 meq | Freq: Once | ORAL | 0 refills | Status: CP
Start: 2023-06-30 — End: ?
  Administered 2023-07-01: 05:00:00 40 meq via ORAL

## 2023-06-30 MED ORDER — LACTATED RINGERS IV SOLP
1000 mL | INTRAVENOUS | 0 refills | Status: DC
Start: 2023-06-30 — End: 2023-07-01

## 2023-06-30 MED ORDER — LACTATED RINGERS IV SOLP
1000 mL | INTRAVENOUS | 0 refills | Status: AC
Start: 2023-06-30 — End: ?
  Administered 2023-07-01: 06:00:00 1000 mL via INTRAVENOUS

## 2023-06-30 MED ORDER — ONDANSETRON 4 MG PO TBDI
8 mg | ORAL | 0 refills | Status: DC | PRN
Start: 2023-06-30 — End: 2023-07-03
  Administered 2023-07-03: 15:00:00 8 mg via ORAL

## 2023-06-30 MED ORDER — MELATONIN 5 MG PO TAB
5 mg | Freq: Every evening | ORAL | 0 refills | Status: DC | PRN
Start: 2023-06-30 — End: 2023-07-03
  Administered 2023-07-01 – 2023-07-03 (×3): 5 mg via ORAL

## 2023-06-30 MED ORDER — ACETAMINOPHEN 325 MG PO TAB
650 mg | ORAL | 0 refills | Status: DC | PRN
Start: 2023-06-30 — End: 2023-07-03
  Administered 2023-07-01 – 2023-07-03 (×4): 650 mg via ORAL

## 2023-06-30 MED ORDER — INSULIN ASPART 100 UNIT/ML SC FLEXPEN
0-6 [IU] | Freq: Every day | SUBCUTANEOUS | 0 refills | Status: DC
Start: 2023-06-30 — End: 2023-07-01

## 2023-06-30 NOTE — Progress Notes
 For continuity of care purposes, please fax CT abdomen/pelvis results from March 2025 Emergency Department visit for Tina Snyder DOB 09-22-1965 to [facility] at [phone], attn: Eber Jones, RN and Dr. Janene Madeira.

## 2023-06-30 NOTE — Procedures
 Vascular Access Team consulted to obtain lab specimen.    Ultrasound Used: Yes    How Many Attempts: 1    Location of Unsuccessful Attempts: na    At 2239 approximately 6 ml of blood obtained from LAC. Patient tolerated the procedure well. Specimen labeled and sent to lab

## 2023-07-01 ENCOUNTER — Inpatient Hospital Stay: Admit: 2023-07-01 | Discharge: 2023-07-01 | Payer: BC Managed Care – PPO

## 2023-07-01 ENCOUNTER — Inpatient Hospital Stay: Admit: 2023-07-01 | Discharge: 2023-06-30 | Payer: BC Managed Care – PPO

## 2023-07-01 LAB — LIPASE: ~~LOC~~ BKR LIPASE: 25 U/L (ref 11–82)

## 2023-07-01 LAB — LACTIC ACID(LACTATE): ~~LOC~~ BKR LACTIC ACID: 0.8 mmol/L (ref 0.5–2.0)

## 2023-07-01 LAB — COMPREHENSIVE METABOLIC PANEL
~~LOC~~ BKR ALBUMIN: 4.3 g/dL (ref 3.5–5.0)
~~LOC~~ BKR ALK PHOSPHATASE: 120 U/L — ABNORMAL HIGH (ref 25–110)
~~LOC~~ BKR ALT: 11 U/L (ref 7–56)
~~LOC~~ BKR ANION GAP: 12 (ref 3–12)
~~LOC~~ BKR AST: 14 U/L (ref 7–40)
~~LOC~~ BKR CALCIUM: 9.2 mg/dL (ref 8.5–10.6)
~~LOC~~ BKR CHLORIDE: 105 mmol/L (ref 98–110)
~~LOC~~ BKR CO2: 25 mmol/L (ref 21–30)
~~LOC~~ BKR CREATININE: 2.2 mg/dL — ABNORMAL HIGH (ref 0.40–1.00)
~~LOC~~ BKR GLOMERULAR FILTRATION RATE (GFR): 24 mL/min — ABNORMAL LOW (ref >60)
~~LOC~~ BKR POTASSIUM: 3.5 mmol/L (ref 3.5–5.1)
~~LOC~~ BKR TOTAL BILIRUBIN: 0.6 mg/dL (ref 0.2–1.3)
~~LOC~~ BKR TOTAL PROTEIN: 6.6 g/dL (ref 6.0–8.0)

## 2023-07-01 LAB — POC GLUCOSE
~~LOC~~ BKR POC GLUCOSE: 83 mg/dL (ref 70–100)
~~LOC~~ BKR POC GLUCOSE: 104 mg/dL — ABNORMAL HIGH (ref 70–100)
~~LOC~~ BKR POC GLUCOSE: 79 mg/dL (ref 70–100)
~~LOC~~ BKR POC GLUCOSE: 82 mg/dL (ref 70–100)
~~LOC~~ BKR POC GLUCOSE: 87 mg/dL (ref 70–100)

## 2023-07-01 LAB — BETA HYDROXYBUTYRATE (KETONES): ~~LOC~~ BKR BETA HYDROXYBUTYRATE: 0.4 mmol/L — ABNORMAL HIGH (ref <0.3)

## 2023-07-01 LAB — TSH WITH FREE T4 REFLEX: ~~LOC~~ BKR TSH: 2.5 [IU]/mL (ref 0.35–5.00)

## 2023-07-01 LAB — CBC
~~LOC~~ BKR MCH: 29 pg (ref 26.0–34.0)
~~LOC~~ BKR MCV: 89 fL (ref 80.0–100.0)
~~LOC~~ BKR RBC COUNT: 4.1 10*6/uL (ref 4.00–5.00)
~~LOC~~ BKR WBC COUNT: 7.9 10*3/uL (ref 4.50–11.00)

## 2023-07-01 MED ORDER — ALUM-MAG HYDROXIDE-SIMETH 200-200-20 MG/5 ML PO SUSP
30 mL | ORAL | 0 refills | Status: DC | PRN
Start: 2023-07-01 — End: 2023-07-03
  Administered 2023-07-01: 12:00:00 30 mL via ORAL

## 2023-07-01 MED ORDER — ATORVASTATIN 40 MG PO TAB
40 mg | Freq: Every day | ORAL | 0 refills | Status: DC
Start: 2023-07-01 — End: 2023-07-03
  Administered 2023-07-01 – 2023-07-02 (×2): 40 mg via ORAL

## 2023-07-01 MED ORDER — INSULIN ASPART 100 UNIT/ML SC FLEXPEN
0-6 [IU] | Freq: Before meals | SUBCUTANEOUS | 0 refills | Status: DC
Start: 2023-07-01 — End: 2023-07-03

## 2023-07-01 MED ORDER — PANTOPRAZOLE 40 MG PO TBEC
40 mg | Freq: Two times a day (BID) | ORAL | 0 refills | Status: DC
Start: 2023-07-01 — End: 2023-07-03
  Administered 2023-07-02 – 2023-07-03 (×4): 40 mg via ORAL

## 2023-07-01 MED ORDER — BUPROPION XL 150 MG PO TB24
300 mg | Freq: Every day | ORAL | 0 refills | Status: DC
Start: 2023-07-01 — End: 2023-07-03
  Administered 2023-07-01 – 2023-07-02 (×2): 300 mg via ORAL

## 2023-07-01 MED ORDER — PROMETHAZINE 25 MG PO TAB
25 mg | ORAL | 0 refills | Status: DC | PRN
Start: 2023-07-01 — End: 2023-07-03
  Administered 2023-07-01 – 2023-07-03 (×3): 25 mg via ORAL

## 2023-07-01 MED ORDER — BUDESONIDE 3 MG PO CECX
6 mg | Freq: Every day | ORAL | 0 refills | Status: DC
Start: 2023-07-01 — End: 2023-07-03
  Administered 2023-07-01 – 2023-07-02 (×2): 6 mg via ORAL

## 2023-07-01 MED ORDER — PEG-ELECTROLYTE SOLN 420 GRAM PO SOLR
2 L | Freq: Once | ORAL | 0 refills | Status: CP
Start: 2023-07-01 — End: ?
  Administered 2023-07-02: 04:00:00 2 L via ORAL

## 2023-07-01 MED ORDER — PANTOPRAZOLE 40 MG PO TBEC
40 mg | Freq: Every day | ORAL | 0 refills | Status: DC
Start: 2023-07-01 — End: 2023-07-01
  Administered 2023-07-01: 12:00:00 40 mg via ORAL

## 2023-07-01 MED ORDER — ALBUTEROL SULFATE 2.5 MG /3 ML (0.083 %) IN NEBU
1.25 mg | RESPIRATORY_TRACT | 0 refills | Status: DC | PRN
Start: 2023-07-01 — End: 2023-07-03

## 2023-07-01 MED ORDER — FAMOTIDINE 20 MG PO TAB
20 mg | Freq: Every day | ORAL | 0 refills | Status: DC
Start: 2023-07-01 — End: 2023-07-03
  Administered 2023-07-01 – 2023-07-02 (×2): 20 mg via ORAL

## 2023-07-01 NOTE — Care Plan
 Problem: Discharge Planning  Goal: Participation in plan of care  Outcome: Goal Ongoing  Goal: Knowledge regarding plan of care  Outcome: Goal Ongoing  Goal: Prepared for discharge  Outcome: Goal Ongoing     Problem: Infection, Risk of  Goal: Absence of infection  Outcome: Goal Ongoing  Goal: Knowledge of Infection Control Procedures  Outcome: Goal Ongoing

## 2023-07-01 NOTE — Progress Notes
 Float Pool END OF SHIFT/ JHFRAT NOTE    Admission Date: 06/30/2023    Acute events, interventions, provider communication: NA. Pt will call out when appropriate.     Patient Interventions and Education  Fall Risk/JHFRAT Interventions and Education: (Charting when applicable)  Elimination Interventions : N/A  Medications : N/A  Patient Care Equipment: Does not need assistance with patient care equipment when ambulating, Ensure environment is free of clutter and walkways are clear from tripping hazards, and Assess need for patient equipment and remove if not in use  Mobility: N/A  Cognition: N/A  Risk for Moderate/Major Injury: N/A    2. Restraints:  No     Restraint Goal: Patient will be free from injury while physically restrained.  See Docflowsheet for restraint documentation, interventions, education, etc.    Intake and Output:       Intake/Output Summary (Last 24 hours) at 07/01/2023 1822  Last data filed at 07/01/2023 1515  Gross per 24 hour   Intake 520 ml   Output 150 ml   Net 370 ml              Last Bowel Movement Date:  (PTA)

## 2023-07-01 NOTE — Case Management (ED)
 Case Management Admission Assessment    NAME:Tina Snyder                          MRN: [id number]             DOB:25-Jul-1965          AGE: 58 y.o.  ADMISSION DATE: 06/30/2023             DAYS ADMITTED: LOS: 1 day      Today?s Date: 07/01/2023    Source of Information: Patient and EMR    Per EMR review:  Tina Snyder is a 58 year old woman who was directly admitted for nausea and vomiting. She communicated with GI clinic in the afternoon of 06/30/23 due to progressively worsening symptoms for about 1 week and was directed for admission for further evaluation and treatment.       Plan  Plan: Case Management Assessment, Assist PRN with SW/NCM Services    NCM reviewed EMR.  NCM attended and participated in MPE huddle.  NCM spoke with patient to complete initial assessment. NCM provided contact information and explanation of NCM role. NCM provided opportunity for questions and discussion. Patient/family encouraged to contact Case Management team with questions and concerns during hospitalization and until patient is able to transition back to the patient's primary care physician.  Patient verifies PCP, Colin Benton.   Demographics verified with patient.  Patient shares that she lives with her spouse in a home in [place]. She is indpendent in all self-care and ADLs .   Her spouse will provide transportation upon discharge.    No CM needs identified at this time. CM team will continue to follow for discharge planning/needs.      Patient Address/Phone  [address] [place] [address]  [phone] (home)     Emergency Contact  Extended Emergency Contact Information  Primary Emergency Contact: Tina Snyder  Address: [address]           [place] [id number] [employer]  Home Phone: [phone]  Work Phone: [phone]  Mobile Phone: [phone]  Relation: Spouse  Interpreter needed? No    Healthcare Directive         Transportation  Does the Patient Need Case Management to Arrange Discharge Transport? (ex: facility, ambulance, wheelchair/stretcher, Medicaid, cab, other): No  Will the Patient Use Family Transport?: Yes  Transportation Name, Phone and Availability #1: Vallorie Niccoli  (Spouse)  [phone]    Expected Discharge Date  07/04/2023     Living Situation Prior to Admission  Living Arrangements  Type of Residence: Home, independent  Living Arrangements: Spouse/significant other (Lives with husband, Tina Snyder)  How many levels in the residence?: 1  Can patient live on one level if needed?: Yes  Does residence have entry and/or inside stairs?: No (1 STE or ramp entrance)  Assistance needed prior to admit or anticipated on discharge: No  Who provides assistance or could if needed?: spouse  Are they in good health?: Yes  Level of Function   Prior level of function: Independent  Cognitive Abilities   Cognitive Abilities: Alert and Oriented, Engages in problem solving and planning, Recognizes impact of health condition on lifestyle, Participates in decision making    Financial Resources  Coverage  Primary Insurance: Nurse, learning disability ([employer])  Secondary Insurance: Medicare  Medication Coverage    Medication Coverage: Commercial insurance ([employer]BS [facility])  Have you experienced a noticeable increase in your  copay costs recently?: No  Are current medications affordable?: Yes  Do You Use a Co-Pay Card or a Medication Assistance Program to Help Manage Medication Costs?: No  Do You Manage Your Own Medications?: Yes  Source of Income   Source Of Income: Employed (Works part time at [employer])  Corporate investment banker Needed?  NA    Psychosocial Needs  Mental Health  Mental Health History: Yes  Mental Health Provider: follows with PCP for medicaiton management  Mental Health Symptoms: Excessive fears/worries  Substance Use History  Substance Use History Screen: No  Other  NA    Current/Previous Services  PCP  Tina Snyder, [phone], [phone]  Pharmacy    [facility] [phone] - [place] - [address]  [address] [place]  [place] [address]  Phone: [phone] Fax: [phone]    [facility] [place], [place] - [address]  [phone]  [place] [address]  Phone: [phone] Fax: [phone]    Durable Medical Equipment   Durable Medical Equipment at home: CPAP/BiPAP  Home Health  Receiving home health: In the past  Hemodialysis or Peritoneal Dialysis  Undergoing hemodialysis or peritoneal dialysis: No  Tube/Enteral Feeds  Receive tube/enteral feeds: No  Infusion  Receive infusions: Yes  Where: [facility]  Frequency: every 8 weeks, next dose due 3/14  Medication Received: Thompson Grayer transition to [facility] 3/14  Private Duty  Private duty help used: No  Home and [employer] and community based services: No  Ryan White  Ryan White: N/A  Hospice  Hospice: No  Outpatient Therapy  PT: In the past  OT: No  SLP: No  Skilled Nursing Facility/Nursing Home  SNF: No  NH: No  Inpatient Rehab  IPR: In the past  When did patient receive care?: following 12 week hospital stay  Name of Facility: 2009  [facility]  LTACH: No  Acute Hospital Stay  Acute Hospital Stay: In the past  Was patient's stay within the last 30 days?: No      Tina [place], BSN, RN  Nurse Case Manager  Ph. [phone]  Available on Voalte

## 2023-07-01 NOTE — Consults
 Gastroenterology Consult Note  Patient Name:Tina Snyder         ZOX:[id number]  Admission Date: 06/30/2023  7:26 PM      Principal Problem:    Intractable nausea and vomiting      History of Present Illness/Subjective:  Pura Picinich is a 58 y.o. female with history of fistulizing ileocolonic Crohn's disease  with multiple small bowel and colonic resection, most resently on entyvio and will start stelara this week, who presented with nausea and vomiting for which GI was consulted. Per patient this started about 10 days ago with unable to keep anything down. She discussed with Dr Buckles who recommended her to be evaluated in the ED. Denies any diarrhea, states that she usually is either constipated or has diarrhea and currently seems to be more constipated. Also has been having epigastric abdominal discomfort and bloating. Denies sick contact or recent travels. She is on monjauro for diarrhea, which she takes once a month, since about december.     CTAP: Prior ileocolonic and distal colonic resection and anastomosis. No   bowel obstruction, pneumoperitoneum, or acute inflammatory mass in the   abdomen/pelvis. Inspissated stool at the rectosigmoid anastomosis.          Assessment/ Plan:    Nausea and vomiting  Constipation  History of Crohn's disease   - Seems to be due to constipation given CT findings. Unlikely to be due to crohns disease exacerbation.       Recommendations:  - Recommend aggressive bowel regimen with golytely  - PRN nausea medication per primary   - Plan for EGD later this week, after constipation improves if ongoing N/V continues.     Patient seen/discussed with Dr. Donell Sievert, MD  Available on [facility]  Fellow, Gastroenterology and Hepatology  PGY-4      07/01/2023 3:47 PM       -----------------------------  PMH:  Past Medical History:    Anxiety    Bleeding tendency    Blood clot in vein    Bowel disease    Cerebral artery occlusion with cerebral infarction (CMS-HCC) CKD (chronic kidney disease) stage 3, GFR 30-59 ml/min (CMS-HCC)    Constipation    Crohn disease (CMS-HCC)    Deep vein thrombosis (DVT) (CMS-HCC)    Depression    Difficult intravenous access    Diverticulitis of colon (without mention of hemorrhage)(562.11)    Dizziness    DM (diabetes mellitus), secondary-steroid-induced    DVT (deep venous thrombosis) (CMS-HCC)    Fibromyalgia    GERD (gastroesophageal reflux disease)    H/O Mycobacterium avium intracellulare infection    Hearing loss    Heart murmur    Hyperlipidemia    Hypertension    Hypertensive heart disease    Irritable bowel disease    Joint pain    Lung disease    Migraines    Nausea    Occlusion of left subclavian vein (CMS-HCC)    Pancreatitis    Pulmonary embolism (CMS-HCC)    SBO (small bowel obstruction) (CMS-HCC)    Seasonal allergic reaction    Seizure disorder (CMS-HCC)    Small bowel obstruction (CMS-HCC)    Steatohepatitis    Type 2 diabetes mellitus without complication, with long-term current use of insulin (HCC)    Ulcer    Vaginal tumor       Current medications:  No current facility-administered medications on file prior to encounter.     Current Outpatient  Medications on File Prior to Encounter   Medication Sig Dispense Refill    amLODIPine (NORVASC) 5 mg tablet TAKE 1 TABLET BY MOUTH EVERY DAY 90 tablet 1    atorvastatin (LIPITOR) 40 mg tablet TAKE 1 TABLET BY MOUTH EVERY DAY 90 tablet 1    BD LUER-LOK SYRINGE 3 mL 25 x 5/8 FOR VITAMIN B12 INJECTIONS      budesonide (ENTOCORT EC) 3 mg capsule TAKE 2 CAPSULES BY MOUTH EVERY DAY 180 capsule 1    buPROPion XL (WELLBUTRIN XL) 300 mg tablet TAKE ONE TABLET BY MOUTH EVERY MORNING. DO NOT CRUSH OR CHEW. 90 tablet 1    chlorthalidone (HYGROTON) 25 mg tablet TAKE 1 TABLET BY MOUTH EVERY DAY 90 tablet 3    cyanocobalamin (vitamin B-12) (RUBRAMIN PC) 1,000 mcg/mL injection solution Inject 1 mL under the skin every 30 days. 1 mL 6    cyclobenzaprine (FLEXERIL) 10 mg tablet TAKE ONE TABLET BY MOUTH AT BEDTIME DAILY. 90 tablet 0    DEXCOM G7 SENSOR sensor device Use one each as directed every 10 days. Indications: type 2 diabetes mellitus 9 each 3    dexlansoprazole (DEXILANT) 60 mg capsule TAKE ONE CAPSULE BY MOUTH DAILY. INDICATIONS: GASTROESOPHAGEAL REFLUX DISEASE 90 capsule 3    diclofenac sodium (VOLTAREN) 1 % topical gel Apply four g topically to affected area four times daily. 300 g 3    doxycycline hyclate (VIBRACIN) 100 mg tablet Take one tablet by mouth twice daily. 20 tablet 0    duloxetine DR (CYMBALTA) 60 mg capsule TAKE 1 CAPSULE BY MOUTH EVERY DAY 90 capsule 3    EPINEPHrine (AUVI-Q) 1 mg/mL injection pen (2-Pack) Inject 0.3 mg (1 Pen) into thigh if needed for anaphylactic reaction. May repeat in 5-15 minutes if needed. 2 each 1    famotidine (PEPCID) 40 mg tablet Take one tablet by mouth twice daily. 180 tablet 3    glucagon (BAQSIMI) 3 mg/actuation nasal spray Use 1 spray in single nostril. If no response, may repeat in 15 minutes using second device. 2 each 0    hydrOXYzine HCL (ATARAX) 25 mg tablet TAKE 1 TABLET BY MOUTH THREE TIMES A DAY 30 tablet 0    insulin aspart (U-100) (NOVOLOG FLEXPEN U-100 INSULIN) 100 unit/mL (3 mL) PEN Inject five Units to ten Units under the skin three times daily with meals. 15 mL 3    insulin glargine (BASAGLAR KWIKPEN U-100 INSULIN) 100 unit/mL (3 mL) subcutaneous PEN Inject thirty six Units under the skin daily. 30 mL 3    insulin glargine (LANTUS SOLOSTAR U-100 INSULIN) 100 unit/mL (3 mL) subcutaneous PEN Inject forty two Units under the skin at bedtime daily. 45 mL 1    levalbuterol tartrate (XOPENEX HFA) 45 mcg/actuation inhaler Inhale two puffs by mouth into the lungs every 4-6 hours as needed for Wheezing or Shortness of Breath. 15 g 11    lipase-protease-amylase (CREON) 36,000-114,000- 180,000 unit capsule Take two capsules by mouth three times daily with meals. Take one capsule with snacks 360 capsule 3    losartan (COZAAR) 100 mg tablet TAKE 1 TABLET BY MOUTH EVERY DAY 90 tablet 1    nebivoloL (BYSTOLIC) 10 mg tablet TAKE 1 TABLET BY MOUTH EVERY DAY 90 tablet 1    needle (disp) 25 gauge 25 gauge x 5/8 For Vitamin B12 injections 10 each 0    ondansetron (ZOFRAN ODT) 4 mg rapid dissolve tablet Dissolve one tablet by mouth every 8 hours as needed for Nausea or Vomiting.  Place on tongue to dissolve. 30 tablet 1    pantoprazole DR (PROTONIX) 40 mg tablet Take one tablet by mouth daily. 90 tablet     peg 3350-electrolytes (GOLYTELY) 236-22.74-6.74 -5.86 gram oral solution Mix as directed on package. Drink (8oz) every 10 minutes until gone. Refrigerate once mixed. 4000 mL 0    pen needle, diabetic (BD NANO 2ND GEN PEN NEEDLE) 32 gauge x 5/32 pen needle Use with insulin injections four times daily  Indications: e11.22 400 each 2    polyethylene glycol 3350 (MIRALAX) 17 gram/dose powder Take seventeen g by mouth twice daily. 1054 g 1    pramoxine-hydrocortisone (ANALPRAM-HC) 1-1 % rectal cream Insert or Apply one g to rectal area as directed twice daily. 30 g 1    Syringe (Disposable) 3 mL For vitamin B12 injections 10 each 0    tirzepatide (MOUNJARO) 2.5 mg/0.5 mL injector PEN INJECT 0.5 ML UNDER THE SKIN EVERY 7 DAYS 6 mL 1    ustekinumab (STELARA) 90 mg syringe Inject 1 mL under the skin every 8 weeks. 1 mL 3    vedolizumab (ENTYVIO IV) Administer  through vein every 8 weeks.      vonoprazan (VOQUEZNA) 10 mg tablet Take one tablet by mouth daily. 30 tablet 11       PSH:  Surgical History:   Procedure Laterality Date    HX ADENOIDECTOMY  05/24/1995    LUNG SURGERY  10/22/2010    lung biopsy    LIVER BIOPSY  12/22/2010    ESOPHAGOGASTRODUODENOSCOPY N/A 03/13/2015    Performed by Elyn Aquas, MD at [facility] ENDO    COLONOSCOPY N/A 03/13/2015    Performed by Elyn Aquas, MD at [facility] ENDO    ESOPHAGOGASTRODUODENOSCOPY BIOPSY  03/13/2015    Performed by Elyn Aquas, MD at [facility] ENDO    COLONOSCOPY BIOPSY  03/13/2015    Performed by Elyn Aquas, MD at [facility] ENDO ESOPHAGOGASTRODUODENOSCOPY WITH SPECIMEN COLLECTION BY BRUSHING/ WASHING N/A 06/30/2017    Performed by Jolee Ewing, MD at [facility] ENDO    COLONOSCOPY DIAGNOSTIC WITH SPECIMEN COLLECTION BY BRUSHING/ WASHING - FLEXIBLE N/A 06/30/2017    Performed by Jolee Ewing, MD at [facility] ENDO    ESOPHAGOGASTRODUODENOSCOPY WITH BIOPSY - FLEXIBLE N/A 06/30/2017    Performed by Jolee Ewing, MD at [facility] ENDO    COLONOSCOPY WITH BIOPSY - FLEXIBLE  06/30/2017    Performed by Jolee Ewing, MD at [facility] ENDO    COLONOSCOPY DIAGNOSTIC WITH SPECIMEN COLLECTION BY BRUSHING/ WASHING - FLEXIBLE N/A 11/11/2018    Performed by Tommie Sams, MD at [facility] ENDO    ESOPHAGOGASTRODUODENOSCOPY WITH SPECIMEN COLLECTION BY BRUSHING/ WASHING N/A 11/11/2018    Performed by Tommie Sams, MD at [facility] ENDO    ESOPHAGOGASTRODUODENOSCOPY WITH BIOPSY - FLEXIBLE  11/11/2018    Performed by Tommie Sams, MD at [facility] ENDO    COLONOSCOPY DIAGNOSTIC WITH SPECIMEN COLLECTION BY BRUSHING/ WASHING - FLEXIBLE N/A 11/12/2018    Performed by Veneta Penton, MD at [facility] ENDO    COLONOSCOPY WITH BIOPSY - FLEXIBLE  11/12/2018    Performed by Veneta Penton, MD at [facility] ENDO    ESOPHAGOGASTRODUODENOSCOPY WITH ENDOSCOPIC ULTRASOUND EXAMINATION - FLEXIBLE N/A 03/01/2020    Performed by Comer Locket, MD at [facility] ENDO    COLONOSCOPY WITH BIOPSY - FLEXIBLE N/A 05/08/2022    Performed by Buckles, Vinnie Level, MD at [facility] ENDO    ESOPHAGOGASTRODUODENOSCOPY WITH BIOPSY - FLEXIBLE N/A 05/08/2022    Performed by Buckles, Reuel Boom  C, MD at [facility] ENDO    COLONOSCOPY WITH BIOPSY - FLEXIBLE N/A 06/10/2023    Performed by Buckles, Vinnie Level, MD at 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     Associate Professor: [employer]   Tobacco Use    Smoking status: Never     Passive exposure: Past    Smokeless tobacco: Never   Vaping Use    Vaping status: Never Used   Substance and Sexual Activity    Alcohol use: No    Drug use: No    Sexual activity: Yes     Partners: Male     Birth control/protection: None   Social History Narrative    Married x 28 years; 2 children 79, 82 yo; 2012 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            FH:  Family History   Problem Relation Name Age of Onset    Hypertension Mother Clayborne Dana     Depression Mother Patti     Basal Cell Carcinoma Mother Clayborne Dana     Crohn's Disease Mother Clayborne Dana     Diabetes Mother Clayborne Dana     Dementia Mother Clayborne Dana     Hypertension Father Harvie Heck     High Cholesterol Father Randy     Depression Father Harvie Heck     Liver Disease Father Harvie Heck     Kidney Failure Father Harvie Heck     Alcohol abuse Father Harvie Heck     Celiac Disease Son [place]     Asthma Son Anon Raices     Hypertension Maternal Grandmother [place]     Stroke Maternal Grandmother Naomi     Heart Failure Maternal Lenord Fellers     Stroke Maternal Grandfather Ken     Migraines Maternal Grandfather Ken     Cancer-Breast Paternal Grandmother Corrie Dandy     Cancer Paternal Grandmother Mary         breast    Hypertension Paternal Grandmother Mary     High Cholesterol Paternal Grandmother Mary     Depression Paternal Grandmother Mary     Heart Failure Paternal Marletta Lor     Hypertension Paternal Marletta Lor     Blood Clots Neg Hx      COPD Neg Hx      Coronary Artery Disease Neg Hx      Cystic Fibrosis Neg Hx      DVT Neg Hx      Pulmonary Embolism Neg Hx      Pulmonary Fibrosis Neg Hx  Pulmonary HTN Neg Hx Melanoma Neg Hx         Review of Systems:  Constitutional: Denies fevers, chills, weight loss  Eyes: Denies change in vision  Ears, nose, mouth: Denies oral bleeding, ulcer,   Cardiovascular: Denies chest pain, palpitations or lower extremity edema   Respiratory: Denies dyspnea, cough   Gastrointestinal: Denies abdominal pain, blood in stool or changes in bowel habits  Musculoskeltal: Denies joint inflammation, new deformity  Integumentary: Denies rashes or abrasions  Neurologic: Denies cognitive change, focal weakness  Hematologic: Denies easy bleeding or bruising  Please see HPI for additional pertinent documentation    Physical Exam:  Vitals:    06/30/23 2303 06/30/23 2354 07/01/23 0748 07/01/23 1130   BP:  118/70 96/62 107/65   BP Source:  Arm, Left Upper Arm, Left Upper Arm, Left Upper   Pulse: 84 79 65 70   Temp:  36.4 ?C (97.5 ?F) 36.4 ?C (97.6 ?F) 36.5 ?C (97.7 ?F)   SpO2:  100% 100% 100%   O2 Device:  None (Room air) None (Room air) None (Room air)   Weight:       Height:         Constitutional- Vitals above, no acute distress.   Head - Normocephalic, atraumatic.   Eyes -  PERRLA, EOMI, anicteric conjunctivae, no scleral injection or conjunctival pallor    Ears, nose, mouth, throat- Moist mucus membranes, No oral ulcer or bleeding.   Neck - No swelling or tracheal deviation, no lymphadenopathy appreciated   Respiratory - Symmetric chest rise, no increased work of breathing.  Cardiovascular - Peripheral pulses intact, no pedal edema.  Gastrointestinal- Soft, Non-TTP. No hepatosplenomegaly. BS+ in all quadrants  Skin - No exposed rash, lesion or jaundice   Neurologic - Answering questions and following commands appropriately, No CN deficit, normal fluid speech  Psychiatric - Appropriate mood/affect, Judgement intact, thought content appropriate, AAOx3  Labs/Imaging:  Pertient labs/imaging were reviewed on initiation of progress note.

## 2023-07-01 NOTE — Progress Notes
 General Internal Medicine Progress Note    Name: Tina Snyder        MRN: [id number]          DOB: 1965-09-06            Age: 58 y.o.  Admission Date: 06/30/2023       LOS: 1 day    Date of Service: 07/01/2023    Assessment/Plan:    Principal Problem:    Intractable nausea and vomiting    Tina Snyder is a 59 year old woman admitted with intractable nausea, vomiting, abdominal pain, diarrhea. She has crohn's disease, chronic pancreatitis, type 2 diabetes, depression, GERD.      Intractable nausea, vomiting  Crohn's disease (fistulizing ileocolonic)  Chronic pancreatitis  -directly admitted 06/30/23 with epigastric abdominal pain, watery diarrhea with mucus, poor appetite, nausea, vomiting  -previously on CREON  -last colonoscopy 06/10/23: diverticulosis in the sigmoid. Nonbleeding external hemorrhoids. Multiple biopsies taken, showing superficial erosion with associated acute and chronic inflammation, negative for dysplasia.   -follows with Dr Buckles. Currently on Entyvio and planning to start stelara.               -allergic to sulfasalazine. Previously on MTX and pentasa.   -possible severe Crohn's exacerbation vs infectious processes. Will start with infectious workup and consider GI consult depending on initial workup. No obvious signs of colitis or sepsis at the time of admission.   -Lipase within normal limits, mildly elevated beta hydroxybutyrate, normal lactate  -CT abdomen and pelvis with prior ileocolonic and distal colonic resection and anastomosis, no bowel obstruction, pneumoperitoneum, acute inflammatory mass  Plan  -PTA budesonide 6mg  daily, Entyvio. Continue budesonide 6mg  daily.   -GI consulted  -Follow-up infectious GI workup: Note no bowel movement since admission     AKI on CKD stage 3  -baseline serum creatinine ~ 1.9  -likely prerenal in the setting of dehydration, given 1 L of IV fluids  -Trend creatinine daily     Hypertension  Hyperlipidemia  -PTA amlodipine 5mg  daily, chlorthalidone 25mg  daily, losartan 100mg  daily, nebivolol 10mg  daily. Has been off of all antihypertensive meds since 06/24/23 and bp has been well controlled at home. Will hold upon admission and plan to slowly resume during hospitalization vs outpatient   -PTA atorvastatin 40mg  daily. Continue      Type 2 diabetes  -PTA mounjaro, aspart sliding scale TID AC, tresiba 32u daily.   -has been off of all insulin since 06/24/23. Will watch off insulin for now and monitor fsbg x5 daily with LDCF      RLL opacities-Keeley atresia  -noted on CT chest march 2024. 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 Continue   -discontinued duloxetine December 2024     GERD  -PTA dexlansoprazole daily.  Agitated pantoprazole and increased to twice daily as some symptoms sound like they are related to GERD     H/O subclavian DVT  -complicated by GI bleed while on warfarin      Hypogammaglobulinemia   Multiple food allergies   Prurigo nodularis   H/O TIA?     Consults: GI  Nutrition: Regular  VTE PPx: SCDs   Lines/Drains/tubes: PIV   Code Status: Full Code    Bronson Ing, MD  Internal  Medicine  Available on Voalte and [facility]  Pager [phone]    High medical decision making due to the following:  1 or more chronic illness with severe exacerbation  Review of notes outside of my specialty, Review of each unique test, and Ordering of each unique test and independent interpretation of a test (CT A/P)   _______________________________________________________________________    Subjective  Tina Snyder is a 58 y.o. female.  Patient reports feeling somewhat better today.  She is still having ongoing nausea although no vomiting since admission.  She has also had no bowel movements. Her abdominal pain feels like it has a burning quality to it.  She did trial Mylanta without improvement.    Review of Systems:  ROS negative as per subjective     Medications  Scheduled Meds:atorvastatin (LIPITOR) tablet 40 mg, 40 mg, Oral, QDAY  budesonide (ENTOCORT EC) capsule 6 mg, 6 mg, Oral, QDAY  buPROPion XL (WELLBUTRIN XL) tablet 300 mg, 300 mg, Oral, QDAY  famotidine (PEPCID) tablet 20 mg, 20 mg, Oral, QDAY  insulin aspart (U-100) (NOVOLOG FLEXPEN U-100 INSULIN) injection PEN 0-6 Units, 0-6 Units, Subcutaneous, 5 X Daily  pantoprazole DR (PROTONIX) tablet 40 mg, 40 mg, Oral, QDAY(21)    Continuous Infusions:  PRN and Respiratory Meds:acetaminophen Q4H PRN, albuterol 0.083% Q4H PRN, alum-mag hydroxide-simeth Q4H PRN, dextrose 50% (D50) IV PRN, melatonin QHS PRN, ondansetron Q8H PRN **OR** ondansetron (ZOFRAN) IV Q8H PRN      Objective:                          Vital Signs: Last Filed                 Vital Signs: 24 Hour Range   BP: 118/70 (03/10 2354)  Temp: 36.4 ?C (97.5 ?F) (03/10 2354)  Pulse: 79 (03/10 2354)  Respirations: 16 PER MINUTE (03/10 2354)  SpO2: 100 % (03/10 2354)  O2%: 21 % (03/10 2303)  O2 Device: None (Room air) (03/10 2354)  Height: 162.6 cm (5' 4) (03/10 1943) BP: (113-118)/(70-74)   Temp:  [36.3 ?C (97.4 ?F)-36.4 ?C (97.5 ?F)]   Pulse:  [79-84]   Respirations:  [16 PER MINUTE]   SpO2:  [100 %]   O2%:  [21 %]   O2 Device: None (Room air)     Vitals:    06/30/23 1943   Weight: 82.2 kg (181 lb 3.2 oz)       Intake/Output Summary:  (Last 24 hours)    Intake/Output Summary (Last 24 hours) at 07/01/2023 9604  Last data filed at 06/30/2023 2359  Gross per 24 hour   Intake 120 ml   Output 50 ml   Net 70 ml     Stool Occurrence: 0        Physical Exam  General: laying in bed, appears comfortable, alert, cooperative   HEENT: NC/AT, anicteric sclera, MMM, supple, no JVD   CV: RRR, no m/r/g  Pulm: normal effort, RA, CTA b/l  Abd: +BS, soft, epigastric and bilateral upper quadrant tenderness to light palpation, no rebound or guarding  Ext: warm, dry, no edema b/l LE   Neuro: alert, oriented x3, moves all extremities spontaneously     Lab Review  Pertinent labs reviewed    Point of Care Testing  (Last 24 hours)  Glucose: 89 (06/30/23 2236)  POC Glucose (Download): 82 (07/01/23 0253)    Radiology and other Diagnostics Review:    Pertinent radiology reviewed

## 2023-07-01 NOTE — Progress Notes
 [facility]41 END OF SHIFT/ JHFRAT NOTE    Admission Date: 06/30/2023    Acute events, interventions, provider communication: none    Patient Interventions and Education  Fall Risk/JHFRAT Interventions and Education: (Charting when applicable)  Elimination Interventions : N/A  Medications : Educate patient on medication side effects  Patient Care Equipment: Does not need assistance with patient care equipment when ambulating and Ensure environment is free of clutter and walkways are clear from tripping hazards  Mobility: N/A  Cognition: N/A  Risk for Moderate/Major Injury: N/A    2. Restraints:  No     Restraint Goal: Patient will be free from injury while physically restrained.  See Docflowsheet for restraint documentation, interventions, education, etc.    Intake and Output:       Intake/Output Summary (Last 24 hours) at 07/01/2023 0602  Last data filed at 06/30/2023 2359  Gross per 24 hour   Intake 120 ml   Output 50 ml   Net 70 ml              Last Bowel Movement Date:  (pta)

## 2023-07-01 NOTE — Care Coordination-Inpatient
 Med Private Night 2 0149 Peyton Bottoms (available on Voalte) will take calls on this patient until 8 am on 07/01/2023. Afterwards, please contact Med-Private E team for any questions or concerns. Voalte is the preferred way of communication.     Tery Sanfilippo, M.D.  Night AOD  On Voalte ---- 8 pm to 8 am    .

## 2023-07-02 ENCOUNTER — Observation Stay: Admit: 2023-07-02 | Discharge: 2023-07-02 | Payer: BC Managed Care – PPO

## 2023-07-02 ENCOUNTER — Encounter: Admit: 2023-07-02 | Discharge: 2023-07-02 | Payer: BC Managed Care – PPO

## 2023-07-02 LAB — POC GLUCOSE
~~LOC~~ BKR POC GLUCOSE: 121 mg/dL — ABNORMAL HIGH (ref 70–100)
~~LOC~~ BKR POC GLUCOSE: 124 mg/dL — ABNORMAL HIGH (ref 70–100)
~~LOC~~ BKR POC GLUCOSE: 83 mg/dL (ref 70–100)
~~LOC~~ BKR POC GLUCOSE: 98 mg/dL (ref 70–100)

## 2023-07-02 LAB — CRYPTOSPORIDIUM, FECAL: ~~LOC~~ BKR CRYPTO EIA: NEGATIVE

## 2023-07-02 LAB — GIARDIA SCREEN,FECAL: ~~LOC~~ BKR GIARDIA EIA: NEGATIVE

## 2023-07-02 LAB — LEUKOCYTES, FECAL: ~~LOC~~ BKR FECAL LEUKOCYTES: NEGATIVE

## 2023-07-02 MED ORDER — KETOROLAC 15 MG/ML IJ SOLN
15 mg | Freq: Once | INTRAVENOUS | 0 refills | Status: CP
Start: 2023-07-02 — End: ?
  Administered 2023-07-02: 16:00:00 15 mg via INTRAVENOUS

## 2023-07-02 MED ORDER — DICYCLOMINE 10 MG PO CAP
10 mg | Freq: Once | ORAL | 0 refills | Status: CP
Start: 2023-07-02 — End: ?
  Administered 2023-07-03: 05:00:00 10 mg via ORAL

## 2023-07-02 MED ORDER — BISACODYL 10 MG/30 ML RE ENEM
1 | Freq: Two times a day (BID) | RECTAL | 0 refills | Status: DC
Start: 2023-07-02 — End: 2023-07-03

## 2023-07-02 NOTE — Care Plan
 Problem: Discharge Planning  Goal: Participation in plan of care  Outcome: Goal Ongoing  Goal: Knowledge regarding plan of care  Outcome: Goal Ongoing  Goal: Prepared for discharge  Outcome: Goal Ongoing     Problem: Infection, Risk of  Goal: Absence of infection  Outcome: Goal Ongoing  Goal: Knowledge of Infection Control Procedures  Outcome: Goal Ongoing

## 2023-07-02 NOTE — Progress Notes
 Gastroenterology Consultation - Follow Up      HISTORY OF PRESENT ILLNESS  Tina Snyder is a pleasant 58 y.o. female with a history of fistulizing ileocolonic Crohn's disease  with multiple small bowel and colonic resection, most resently on entyvio and will start stelara this week, who presented with nausea and vomiting for which GI was consulted. Patient reports this started 10 days ago and she has been unable to keep anything down. She contacted Dr. Janene Madeira, who recommended her to be evaluated in the ED. She denies any diarrhea, but reports that she is usually either constipated or has diarrhea. Right now, she reports more constipation. She also reports having some epigastric discomfort and bloating. She is on [facility] for diarrhea, which she started in December and takes once a month. CTAP on 3/10 showed inspissated stool at the rectosigmoid anastomosis but no acute abnormalities. GI recommended proceeding with Golytely for bowel regimen on 3/11.      24 Hour Events/Subjective:   Patient reports drinking 3L bowel prep and having multiple stools following. 5 total. First stool she reports was formed. Latest stool was liquid brown. She reports increased nausea with the bowel prep. KUB ordered this morning shows nonobstructive bowel gas pattern with some retained stool at the level of the rectum.     REVIEW OF MEDICAL RECORDS  Past Medical History:    Anxiety    Bleeding tendency    Blood clot in vein    Bowel disease    Cerebral artery occlusion with cerebral infarction (CMS-HCC)    CKD (chronic kidney disease) stage 3, GFR 30-59 ml/min (CMS-HCC)    Constipation    Crohn disease (CMS-HCC)    Deep vein thrombosis (DVT) (CMS-HCC)    Depression    Difficult intravenous access    Diverticulitis of colon (without mention of hemorrhage)(562.11)    Dizziness    DM (diabetes mellitus), secondary-steroid-induced    DVT (deep venous thrombosis) (CMS-HCC)    Fibromyalgia    GERD (gastroesophageal reflux disease)    H/O Mycobacterium avium intracellulare infection    Hearing loss    Heart murmur    Hyperlipidemia    Hypertension    Hypertensive heart disease    Irritable bowel disease    Joint pain    Lung disease    Migraines    Nausea    Occlusion of left subclavian vein (CMS-HCC)    Pancreatitis    Pulmonary embolism (CMS-HCC)    SBO (small bowel obstruction) (CMS-HCC)    Seasonal allergic reaction    Seizure disorder (CMS-HCC)    Small bowel obstruction (CMS-HCC)    Steatohepatitis    Type 2 diabetes mellitus without complication, with long-term current use of insulin (HCC)    Ulcer    Vaginal tumor     Surgical History:   Procedure Laterality Date    HX ADENOIDECTOMY  05/24/1995    LUNG SURGERY  10/22/2010    lung biopsy    LIVER BIOPSY  12/22/2010    ESOPHAGOGASTRODUODENOSCOPY N/A 03/13/2015    Performed by Elyn Aquas, MD at [facility] ENDO    COLONOSCOPY N/A 03/13/2015    Performed by Elyn Aquas, MD at [facility] ENDO    ESOPHAGOGASTRODUODENOSCOPY BIOPSY  03/13/2015    Performed by Elyn Aquas, MD at [facility] ENDO    COLONOSCOPY BIOPSY  03/13/2015    Performed by Elyn Aquas, MD at [facility] ENDO    ESOPHAGOGASTRODUODENOSCOPY WITH SPECIMEN COLLECTION BY BRUSHING/ WASHING N/A 06/30/2017    Performed by  Jolee Ewing, MD at [facility] ENDO    COLONOSCOPY DIAGNOSTIC WITH SPECIMEN COLLECTION BY BRUSHING/ WASHING - FLEXIBLE N/A 06/30/2017    Performed by Jolee Ewing, MD at [facility] ENDO    ESOPHAGOGASTRODUODENOSCOPY WITH BIOPSY - FLEXIBLE N/A 06/30/2017    Performed by Jolee Ewing, MD at [facility] ENDO    COLONOSCOPY WITH BIOPSY - FLEXIBLE  06/30/2017    Performed by Jolee Ewing, MD at [facility] ENDO    COLONOSCOPY DIAGNOSTIC WITH SPECIMEN COLLECTION BY BRUSHING/ WASHING - FLEXIBLE N/A 11/11/2018    Performed by Tommie Sams, MD at [facility] ENDO    ESOPHAGOGASTRODUODENOSCOPY WITH SPECIMEN COLLECTION BY BRUSHING/ WASHING N/A 11/11/2018    Performed by Tommie Sams, MD at [facility] ENDO    ESOPHAGOGASTRODUODENOSCOPY WITH BIOPSY - FLEXIBLE  11/11/2018    Performed by Tommie Sams, MD at [facility] ENDO    COLONOSCOPY DIAGNOSTIC WITH SPECIMEN COLLECTION BY BRUSHING/ WASHING - FLEXIBLE N/A 11/12/2018    Performed by Veneta Penton, MD at [facility] ENDO    COLONOSCOPY WITH BIOPSY - FLEXIBLE  11/12/2018    Performed by Veneta Penton, MD at [facility] ENDO    ESOPHAGOGASTRODUODENOSCOPY WITH ENDOSCOPIC ULTRASOUND EXAMINATION - FLEXIBLE N/A 03/01/2020    Performed by Comer Locket, MD at [facility] ENDO    COLONOSCOPY WITH BIOPSY - FLEXIBLE N/A 05/08/2022    Performed by Buckles, Vinnie Level, MD at [facility] ENDO    ESOPHAGOGASTRODUODENOSCOPY WITH BIOPSY - FLEXIBLE N/A 05/08/2022    Performed by Buckles, Vinnie Level, MD at [facility] ENDO    COLONOSCOPY WITH BIOPSY - FLEXIBLE N/A 06/10/2023    Performed by Buckles, Vinnie Level, MD at [facility] ENDO    APPENDECTOMY      BRONCHOSCOPY      CHOLECYSTECTOMY      GASTRIC FUNDOPLICATION      HEMORRHOIDECTOMY      HX EAR TUBES      HX ENDOSCOPY      colonoscopy 2014    HX HYSTERECTOMY      partial 02/1995, ovaries removed 1999    OOPHORECTOMY      1999 ovaries removed    SMALL BOWEL RESECTION      SUBTOTAL COLECTOMY      Ileocolectomy      Social History     Socioeconomic History    Marital status: Married     Spouse name: Air cabin crew    Number of children: 2   Occupational History    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     Associate Professor: [employer]   Tobacco Use    Smoking status: Never     Passive exposure: Past    Smokeless tobacco: Never   Vaping Use    Vaping status: Never Used   Substance and Sexual Activity    Alcohol use: No    Drug use: No    Sexual activity: Yes     Partners: Male     Birth control/protection: None   Social History Narrative    Married x 28 years; 2 children 30, 33 yo; 2012 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          Allergies   Allergen Reactions    Bee Venom Protein (Honey Bee) ANAPHYLAXIS    Ciprofloxacin VOMITING Enoxaparin SEE COMMENTS and UNKNOWN     Hemorrhage    Morphine HALLUCINATIONS and  ANAPHYLAXIS     States she quits breathing    Warfarin SEE COMMENTS and UNKNOWN     Hemorrhage    Metoclopramide SEE COMMENTS and RASH     Patient states that she cannot tolerate.    Metronidazole HIVES    Bee [Bumble Bee] UNKNOWN    Ciprofloxacin NAUSEA AND VOMITING    Stadol [Butorphanol Tartrate] SEE COMMENTS     Patient states that it made her loopy and drunk.    Tysabri [Natalizumab] SEE COMMENTS     JC virus positive.  Do not use Tysabri - high risk for PML    Walnut UNKNOWN    Zyrtec [Cetirizine] UNKNOWN    Aspartame UNKNOWN    Butorphanol UNKNOWN    Farxiga [Dapagliflozin] SEE COMMENTS     Recurrent yeast infections    Pecan Nut UNKNOWN    Prochlorperazine HIVES, SEE COMMENTS and UNKNOWN     Tolerates Phenergan    Sulfa (Sulfonamide Antibiotics) HIVES, NAUSEA AND VOMITING and UNKNOWN    Sulfasalazine UNKNOWN    Topiramate UNKNOWN     Current Facility-Administered Medications   Medication Dose Route Frequency Provider Last Rate Last Admin    acetaminophen (TYLENOL) tablet 650 mg  650 mg Oral Q4H PRN Cori Razor, Frederico A, MD   650 mg at 07/01/23 2139    albuterol 0.083% (PROVENTIL) nebulizer solution 1.25 mg  1.25 mg Inhalation Q4H PRN Cori Razor, Frederico A, MD        alum-mag hydroxide-simeth (MYLANTA) oral suspension 30 mL  30 mL Oral Q4H PRN Cori Razor, Frederico A, MD   30 mL at 07/01/23 0701    atorvastatin (LIPITOR) tablet 40 mg  40 mg Oral QDAY Duarte Villa-Chan, Frederico A, MD   40 mg at 07/02/23 0909    budesonide (ENTOCORT EC) capsule 6 mg  6 mg Oral QDAY Duarte Villa-Chan, Frederico A, MD   6 mg at 07/02/23 9381    buPROPion XL (WELLBUTRIN XL) tablet 300 mg  300 mg Oral QDAY Duarte Villa-Chan, Frederico A, MD   300 mg at 07/02/23 0909    dextrose 50% (D50) syringe 25-50 mL  12.5-25 g Intravenous PRN Cori Razor, Frederico A, MD        famotidine (PEPCID) tablet 20 mg  20 mg Oral QDAY Duarte Villa-Chan, Frederico A, MD   20 mg at 07/02/23 0909    insulin aspart (U-100) (NOVOLOG FLEXPEN U-100 INSULIN) injection PEN 0-6 Units  0-6 Units Subcutaneous ACHS (22) Starr, Samantha A, MD        melatonin tablet 5 mg  5 mg Oral QHS PRN Cori Razor, Frederico A, MD   5 mg at 07/01/23 2139    ondansetron (ZOFRAN ODT) rapid dissolve tablet 8 mg  8 mg Oral Q8H PRN Duarte Villa-Chan, Frederico A, MD        Or    ondansetron HCL (PF) (ZOFRAN (PF)) injection 8 mg  8 mg Intravenous Q8H PRN Cori Razor, Frederico A, MD   8 mg at 07/02/23 0909    pantoprazole DR (PROTONIX) tablet 40 mg  40 mg Oral BID(11-21) Bennie Hind A, MD   40 mg at 07/01/23 2139    promethazine (PHENERGAN) tablet 25 mg  25 mg Oral Q6H PRN Bronson Ing, MD   25 mg at 07/01/23 2259     I personally reviewed past medical history, family history, and social history.     REVIEW OF SYSTEMS  HEENT: No loss of vision or bleeding gums  Cardiovascular: No chest pain    Respiratory: No wheezing   Gastrointestinal: See HPI above.     PHYSICAL EXAMINATION  Vital Signs: BP 115/74 (BP Source: Arm, Left Upper)  - Pulse 71  - Temp 36.6 ?C (97.9 ?F)  - Ht 162.6 cm (5' 4)  - Wt 82.2 kg (181 lb 3.2 oz)  - LMP 02/21/1995 (Approximate)  - SpO2 100%  - BMI 31.10 kg/m?   Body mass index is 31.1 kg/m?Marland Kitchen  Physical Exam  Constitutional:       General: She is awake.   Cardiovascular:      Rate and Rhythm: Normal rate.   Pulmonary:      Effort: Pulmonary effort is normal. No respiratory distress.   Abdominal:      General: Bowel sounds are normal. There is no distension.      Palpations: Abdomen is soft.      Tenderness: There is no abdominal tenderness.   Neurological:      Mental Status: She is alert.   Psychiatric:         Mood and Affect: Mood normal.         Speech: Speech normal.        RECENT LABS - Pertinent labs reviewed by me personally and abbreviated in HPI above.     RADIOLOGY - I have personally reviewed the following pertinent radiology images and/or reports:     Abdomen X-Ray (07/02/2023)  Surgical clips and radiopaque surgical staple line are again seen   overlying the right hemiabdomen. The bowel gas pattern is nonobstructive   with scattered collections of retained fecal material within the colon,   most pronounced at the level of the rectum.     CT AP WO Contrast (07/01/2023)   1.  Prior ileocolonic and distal colonic resection and anastomosis. No bowel obstruction, pneumoperitoneum, or acute inflammatory mass in the abdomen/pelvis.    2.  Inspissated stool at the rectosigmoid anastomosis.     GI PROCEDURES - I have personally reviewed the following pertinent GI procedure images and/or reports:     Colonoscopy (06/11/2023)  - Preparation of the colon was fair.   - Scattered linear small aphtha in the distal ileum. Biopsied.   - Patent side-to-side ileo-colonic anastomosis, characterized by healthy appearing mucosa.   - Patent side-to-side colo-colonic anastomosis, characterized by healthy appearing mucosa.   - Diverticulosis in the sigmoid colon.   - Non-bleeding external hemorrhoids.   - Biopsies were taken with a cold forceps for histology in the entire colon.     Final Pathology Diagnosis   A.  Small intestinal mucosa, ileum bx history of crohns, biopsy:               Superficial erosion with associated acute and chronic inflammation.  Negative for dysplasia  B. Colonic mucosa, Random colon bx history of crohns, biopsy:               No diagnostic abnormalities.     ASSESSMENT AND PLAN  Nausea/vomiting  Ileocolonic crohn's disease w/multiple resections  Constipation    Recommendations:  - Will proceed with EGD tomorrow to assess ongoing nausea (case requested)  - NPO @ [facility], except for essential medications (ordered)  - Okay to advance diet as tolerated today  - Recommend enema's for retained fecal material  - Notify us immediately if patient experiences brisk GI bleeding and/or a sudden non-dilutional 2 gm drop in Hgb.     Total Time Today was 50 minutes in  the following activities: Preparing to see the patient, Obtaining and/or reviewing separately obtained history, Performing a medically appropriate examination and/or evaluation, Counseling and educating the patient/family/caregiver, Ordering medications, tests, or procedures, Referring and communication with other health care professionals (when not separately reported), Documenting clinical information in the electronic or other health record, Independently interpreting results (not separately reported) and communicating results to the patient/family/caregiver, and Care coordination (not separately reported)     Patient discussed and plan agreed upon with Dr. Allena Katz. Thank you for this consultation. It has been a pleasure to be a part of the care team. Please feel free to contact the GI consult team with any questions or concerns.    Epifanio Lesches, MSN, APRN, FNP-C  [facility]  Division of Gastroenterology  Available via [facility]  M-F [phone], otherwise contact GI fellow on call

## 2023-07-02 NOTE — Progress Notes
 General Internal Medicine Progress Note    Name: Tina Snyder        MRN: [id number]          DOB: 04/06/1966            Age: 58 y.o.  Admission Date: 06/30/2023       LOS: 1 day    Date of Service: 07/02/2023    Assessment/Plan:    Principal Problem:    Intractable nausea and vomiting    Tina Snyder is a 58 year old woman admitted with intractable nausea, vomiting, abdominal pain, diarrhea. She has crohn's disease, chronic pancreatitis, type 2 diabetes, depression, GERD.      Intractable nausea, vomiting, improved  Crohn's disease (fistulizing ileocolonic)  Chronic pancreatitis  Abdominal pain  -directly admitted 06/30/23 with epigastric abdominal pain, watery diarrhea with mucus, poor appetite, nausea, vomiting  -previously on CREON  -last colonoscopy 06/10/23: diverticulosis in the sigmoid. Nonbleeding external hemorrhoids. Multiple biopsies taken, showing superficial erosion with associated acute and chronic inflammation, negative for dysplasia.   -follows with Dr Buckles. Currently on Entyvio and planning to start stelara.               -allergic to sulfasalazine. Previously on MTX and pentasa.   -possible severe Crohn's exacerbation vs infectious processes. Will start with infectious workup and consider GI consult depending on initial workup. No obvious signs of colitis or sepsis at the time of admission.   -Lipase within normal limits, mildly elevated beta hydroxybutyrate, normal lactate  -CT abdomen and pelvis with prior ileocolonic and distal colonic resection and anastomosis, no bowel obstruction, pneumoperitoneum, acute inflammatory mass  -Negative Giardia, fecal leukocytes, crypto  Plan  -PTA budesonide 6mg  daily, Entyvio. Continue budesonide 6mg  daily.   -GI consulted  -Completed half of GoLytely prep overnight with 5 bowel movements, for solid and liquid after that, stopped.  Had enemas for stool in rectum seen on KUB  -Repeat KUB with nonobstructive bowel gas pattern and retained fecal matter in the colon most pronounced at the rectum  -Follow-up infectious GI workup: Note no bowel movement since admission  -Follow-up fecal calprotectin  -Pain control with Tylenol  -N.p.o. midnight for EGD in a.m.     AKI on CKD stage 3, stable  -baseline serum creatinine ~ 1.9  -likely prerenal in the setting of dehydration, given 1 L of IV fluids  -Trend creatinine daily     Hypertension  Hyperlipidemia  -PTA amlodipine 5mg  daily, chlorthalidone 25mg  daily, losartan 100mg  daily, nebivolol 10mg  daily. Has been off of all antihypertensive meds since 06/24/23 and bp has been well controlled at home. Will hold upon admission and plan to slowly resume during hospitalization vs outpatient   -PTA atorvastatin 40mg  daily. Continue      Type 2 diabetes  -PTA mounjaro, aspart sliding scale TID AC, tresiba 32u daily.   -has been off of all insulin since 06/24/23. Will watch off insulin for now and monitor fsbg x5 daily with LDCF      RLL opacities-Keeley atresia  -noted on CT chest march 2024. 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 Continue   -discontinued duloxetine December 2024     GERD  -PTA dexlansoprazole daily.  Agitated pantoprazole and increased to twice daily as  some symptoms sound like they are related to GERD     H/O subclavian DVT  -complicated by GI bleed while on warfarin      Hypogammaglobulinemia   Multiple food allergies   Prurigo nodularis   H/O TIA?     Consults: GI  Nutrition: Regular  VTE PPx: SCDs   Lines/Drains/tubes: PIV   Code Status: Full Code    Bronson Ing, MD  Internal Medicine  Available on [employer] and [employer]  Pager [phone]    High medical decision making due to the following:  1 or more chronic illness with severe exacerbation  Review of notes outside of my specialty, Review of each unique test, and Ordering of each unique test and discussion of management or test interpretation with GI (physician(s) or other qualified health care professional outside of my specialty)  _______________________________________________________________________    Subjective  Tina Snyder is a 58 y.o. female.  Patient reports symptoms are somewhat worse today.  She feels like drinking the bowel prep has caused her to have more nausea and more pain.  Tylenol does not really seem to be helping her pain.  She did have 1 solid bowel movement after starting bowel prep and then has had 4 liquid bowel movements.  No vomiting.    Review of Systems:  ROS negative as per subjective     Medications  Scheduled Meds:atorvastatin (LIPITOR) tablet 40 mg, 40 mg, Oral, QDAY  budesonide (ENTOCORT EC) capsule 6 mg, 6 mg, Oral, QDAY  buPROPion XL (WELLBUTRIN XL) tablet 300 mg, 300 mg, Oral, QDAY  famotidine (PEPCID) tablet 20 mg, 20 mg, Oral, QDAY  insulin aspart (U-100) (NOVOLOG FLEXPEN U-100 INSULIN) injection PEN 0-6 Units, 0-6 Units, Subcutaneous, ACHS (22)  pantoprazole DR (PROTONIX) tablet 40 mg, 40 mg, Oral, BID(11-21)    Continuous Infusions:  PRN and Respiratory Meds:acetaminophen Q4H PRN, albuterol 0.083% Q4H PRN, alum-mag hydroxide-simeth Q4H PRN, dextrose 50% (D50) IV PRN, melatonin QHS PRN, ondansetron Q8H PRN **OR** ondansetron (ZOFRAN) IV Q8H PRN, promethazine Q6H PRN      Objective:                          Vital Signs: Last Filed                 Vital Signs: 24 Hour Range   BP: 136/90 (03/12 0002)  Temp: 36.4 ?C (97.6 ?F) (03/12 0002)  Pulse: 66 (03/12 0002)  Respirations: 14 PER MINUTE (03/12 0002)  SpO2: 100 % (03/12 0002)  O2 Device: None (Room air) (03/12 0002) BP: (96-136)/(62-90)   Temp:  [36.4 ?C (97.6 ?F)-36.7 ?C (98.1 ?F)]   Pulse:  [65-75]   Respirations:  [14 PER MINUTE-16 PER MINUTE]   SpO2:  [99 %-100 %]   O2 Device: None (Room air) Intensity Pain Scale (Self Report): 6 (07/01/23 2139) Vitals:    06/30/23 1943   Weight: 82.2 kg (181 lb 3.2 oz)       Intake/Output Summary:  (Last 24 hours)    Intake/Output Summary (Last 24 hours) at 07/02/2023 0737  Last data filed at 07/02/2023 0438  Gross per 24 hour   Intake 6271 ml   Output 100 ml   Net 6171 ml     Stool Occurrence: 1        Physical Exam  General: laying in bed, appears comfortable, alert, cooperative   HEENT: NC/AT, anicteric sclera, MMM, supple, no JVD   CV: RRR, no m/r/g  Pulm: normal effort, RA, CTA b/l  Abd: +BS, soft, right upper quadrant and epigastric tenderness but no rebound or guarding  Ext: warm, dry, no edema b/l LE   Neuro: alert, oriented x3, moves all extremities spontaneously     Lab Review  Pertinent labs reviewed    Point of Care Testing  (Last 24 hours)  Glucose: 96 (07/02/23 0520)  POC Glucose (Download): (!) 121 (07/01/23 2133)    Radiology and other Diagnostics Review:    Pertinent radiology reviewed

## 2023-07-02 NOTE — Consults
 IPAC consulted to review isolation for patient. Patient with GI panel rule out flag but the test had been cancelled. Resolved patient flag in chart. Contact IPAC for questions

## 2023-07-02 NOTE — Progress Notes
 [facility]41 END OF SHIFT/ JHFRAT NOTE    Admission Date: 06/30/2023    Acute events, interventions, provider communication:    Patient Interventions and Education  Fall Risk/JHFRAT Interventions and Education: (Charting when applicable)  Elimination Interventions : N/A  Medications : Educate patient on medication side effects  Patient Care Equipment: Does not need assistance with patient care equipment when ambulating and Ensure environment is free of clutter and walkways are clear from tripping hazards  Mobility: N/A  Cognition: N/A  Risk for Moderate/Major Injury: N/A    2. Restraints:  No     Restraint Goal: Patient will be free from injury while physically restrained.  See Docflowsheet for restraint documentation, interventions, education, etc.    Intake and Output:       Intake/Output Summary (Last 24 hours) at 07/02/2023 0440  Last data filed at 07/02/2023 0438  Gross per 24 hour   Intake 6271 ml   Output 100 ml   Net 6171 ml              Last Bowel Movement Date:  (PTA)

## 2023-07-02 NOTE — Progress Notes
 OCCUPATIONAL THERAPY  NOTE   Name: Tina Snyder   MRN: [id number]     DOB: Aug 21, 1965      Age: 58 y.o.  Admission Date: 06/30/2023     LOS: 1 day     Date of Service: 07/02/2023    Patient denies changes from baseline ADL?s and functional mobility and reports no history of balance loss or falls in the last three months.  Patient has not had a procedure or surgery that would make getting dressed difficult, including putting on socks and shoes.  Patient has not demonstrated or reported new difficulties with vision when completing functional tasks.  Patient endorses no concerns with functional skills at home.  Currently, the patient is ambulating in room without difficulty.    Encouraged patient to continue to perform functional skills/ADL?s while in the hospital and discussed the ability for the patient to complete their ADL?s with the bedside nursing staff.  Occupational therapy services will be discontinued at this time, please re-consult if the patient has a change in functional status.      Therapist: Ivin Booty, OTR/L [phone]  Date: 07/02/2023

## 2023-07-02 NOTE — Progress Notes
 PHYSICAL THERAPY  NOTE      Name: Tina Snyder   MRN: [id number]     DOB: 1966/03/02      Age: 58 y.o.  Admission Date: 06/30/2023     LOS: 1 day     Date of Service: 07/02/2023      Per discussion with OT, patient reports no concerns with mobility with no PT goals identified. PT will discontinue service, please re-consult if the patient has a decline in functional status.      Therapist: Edward Jolly, PT, DPT [phone]  Date: 07/02/2023

## 2023-07-03 ENCOUNTER — Encounter: Admit: 2023-07-03 | Discharge: 2023-07-03 | Payer: BC Managed Care – PPO

## 2023-07-03 ENCOUNTER — Inpatient Hospital Stay: Admit: 2023-07-03 | Discharge: 2023-07-03 | Payer: BC Managed Care – PPO

## 2023-07-03 ENCOUNTER — Inpatient Hospital Stay
Admit: 2023-07-01 | Discharge: 2023-07-03 | Disposition: A | Discharge status: Home / Self Care | Payer: BC Managed Care – PPO | Source: Ambulatory Visit

## 2023-07-03 DIAGNOSIS — Z6831 Body mass index (BMI) 31.0-31.9, adult: Secondary | ICD-10-CM

## 2023-07-03 DIAGNOSIS — K861 Other chronic pancreatitis: Secondary | ICD-10-CM

## 2023-07-03 DIAGNOSIS — K219 Gastro-esophageal reflux disease without esophagitis: Secondary | ICD-10-CM

## 2023-07-03 DIAGNOSIS — K7581 Nonalcoholic steatohepatitis (NASH): Secondary | ICD-10-CM

## 2023-07-03 DIAGNOSIS — Z883 Allergy status to other anti-infective agents status: Secondary | ICD-10-CM

## 2023-07-03 DIAGNOSIS — Z841 Family history of disorders of kidney and ureter: Secondary | ICD-10-CM

## 2023-07-03 DIAGNOSIS — Z811 Family history of alcohol abuse and dependence: Secondary | ICD-10-CM

## 2023-07-03 DIAGNOSIS — Z86718 Personal history of other venous thrombosis and embolism: Secondary | ICD-10-CM

## 2023-07-03 DIAGNOSIS — Z8379 Family history of other diseases of the digestive system: Secondary | ICD-10-CM

## 2023-07-03 DIAGNOSIS — Z79899 Other long term (current) drug therapy: Secondary | ICD-10-CM

## 2023-07-03 DIAGNOSIS — N183 Chronic kidney disease, stage 3 unspecified (CMS-HCC): Secondary | ICD-10-CM

## 2023-07-03 DIAGNOSIS — Z823 Family history of stroke: Secondary | ICD-10-CM

## 2023-07-03 DIAGNOSIS — Q324 Other congenital malformations of bronchus: Secondary | ICD-10-CM

## 2023-07-03 DIAGNOSIS — E669 Obesity, unspecified: Secondary | ICD-10-CM

## 2023-07-03 DIAGNOSIS — Z90710 Acquired absence of both cervix and uterus: Secondary | ICD-10-CM

## 2023-07-03 DIAGNOSIS — K5900 Constipation, unspecified: Secondary | ICD-10-CM

## 2023-07-03 DIAGNOSIS — Z794 Long term (current) use of insulin: Secondary | ICD-10-CM

## 2023-07-03 DIAGNOSIS — I129 Hypertensive chronic kidney disease with stage 1 through stage 4 chronic kidney disease, or unspecified chronic kidney disease: Secondary | ICD-10-CM

## 2023-07-03 DIAGNOSIS — Z808 Family history of malignant neoplasm of other organs or systems: Secondary | ICD-10-CM

## 2023-07-03 DIAGNOSIS — Z83438 Family history of other disorder of lipoprotein metabolism and other lipidemia: Secondary | ICD-10-CM

## 2023-07-03 DIAGNOSIS — G40909 Epilepsy, unspecified, not intractable, without status epilepticus: Secondary | ICD-10-CM

## 2023-07-03 DIAGNOSIS — E785 Hyperlipidemia, unspecified: Secondary | ICD-10-CM

## 2023-07-03 DIAGNOSIS — F32A Depression, unspecified: Secondary | ICD-10-CM

## 2023-07-03 DIAGNOSIS — Z882 Allergy status to sulfonamides status: Secondary | ICD-10-CM

## 2023-07-03 DIAGNOSIS — M797 Fibromyalgia: Secondary | ICD-10-CM

## 2023-07-03 DIAGNOSIS — D801 Nonfamilial hypogammaglobulinemia: Secondary | ICD-10-CM

## 2023-07-03 DIAGNOSIS — Z86711 Personal history of pulmonary embolism: Secondary | ICD-10-CM

## 2023-07-03 DIAGNOSIS — E860 Dehydration: Secondary | ICD-10-CM

## 2023-07-03 DIAGNOSIS — Z87892 Personal history of anaphylaxis: Secondary | ICD-10-CM

## 2023-07-03 DIAGNOSIS — Z91030 Bee allergy status: Secondary | ICD-10-CM

## 2023-07-03 DIAGNOSIS — Z8249 Family history of ischemic heart disease and other diseases of the circulatory system: Secondary | ICD-10-CM

## 2023-07-03 DIAGNOSIS — Z7985 Long-term (current) use of injectable non-insulin antidiabetic drugs: Secondary | ICD-10-CM

## 2023-07-03 DIAGNOSIS — I1310 Hypertensive heart and chronic kidney disease without heart failure, with stage 1 through stage 4 chronic kidney disease, or unspecified chronic kidney disease: Secondary | ICD-10-CM

## 2023-07-03 DIAGNOSIS — Z888 Allergy status to other drugs, medicaments and biological substances status: Secondary | ICD-10-CM

## 2023-07-03 DIAGNOSIS — Z833 Family history of diabetes mellitus: Secondary | ICD-10-CM

## 2023-07-03 DIAGNOSIS — E1122 Type 2 diabetes mellitus with diabetic chronic kidney disease: Secondary | ICD-10-CM

## 2023-07-03 DIAGNOSIS — K5090 Crohn's disease, unspecified, without complications: Secondary | ICD-10-CM

## 2023-07-03 DIAGNOSIS — Z825 Family history of asthma and other chronic lower respiratory diseases: Secondary | ICD-10-CM

## 2023-07-03 DIAGNOSIS — Z818 Family history of other mental and behavioral disorders: Secondary | ICD-10-CM

## 2023-07-03 DIAGNOSIS — N179 Acute kidney failure, unspecified: Secondary | ICD-10-CM

## 2023-07-03 DIAGNOSIS — Z8673 Personal history of transient ischemic attack (TIA), and cerebral infarction without residual deficits: Secondary | ICD-10-CM

## 2023-07-03 DIAGNOSIS — L281 Prurigo nodularis: Secondary | ICD-10-CM

## 2023-07-03 DIAGNOSIS — K589 Irritable bowel syndrome without diarrhea: Secondary | ICD-10-CM

## 2023-07-03 LAB — POC GLUCOSE
~~LOC~~ BKR POC GLUCOSE: 115 mg/dL — ABNORMAL HIGH (ref 70–100)
~~LOC~~ BKR POC GLUCOSE: 82 mg/dL (ref 70–100)
~~LOC~~ BKR POC GLUCOSE: 86 mg/dL (ref 70–100)
~~LOC~~ BKR POC GLUCOSE: 91 mg/dL (ref 70–100)

## 2023-07-03 LAB — EGD REPORT

## 2023-07-03 MED ORDER — ACETAMINOPHEN 500 MG PO TAB
1000 mg | Freq: Once | ORAL | 0 refills | Status: CN | PRN
Start: 2023-07-03 — End: ?

## 2023-07-03 MED ORDER — ONDANSETRON HCL (PF) 4 MG/2 ML IJ SOLN
INTRAVENOUS | 0 refills | Status: DC
Start: 2023-07-03 — End: 2023-07-03

## 2023-07-03 MED ORDER — FENTANYL CITRATE (PF) 50 MCG/ML IJ SOLN
INTRAVENOUS | 0 refills | Status: DC
Start: 2023-07-03 — End: 2023-07-03

## 2023-07-03 MED ORDER — ONDANSETRON HCL (PF) 4 MG/2 ML IJ SOLN
4 mg | Freq: Once | INTRAVENOUS | 0 refills | Status: CN | PRN
Start: 2023-07-03 — End: ?

## 2023-07-03 MED ORDER — FENTANYL CITRATE (PF) 50 MCG/ML IJ SOLN
12.5-25 ug | INTRAVENOUS | 0 refills | Status: CN | PRN
Start: 2023-07-03 — End: ?

## 2023-07-03 MED ORDER — PROPOFOL INJ 10 MG/ML IV VIAL
INTRAVENOUS | 0 refills | Status: DC
Start: 2023-07-03 — End: 2023-07-03

## 2023-07-03 MED ORDER — LACTATED RINGERS IV SOLP
INTRAVENOUS | 0 refills | Status: DC
Start: 2023-07-03 — End: 2023-07-03

## 2023-07-03 MED ORDER — HALOPERIDOL LACTATE 5 MG/ML IJ SOLN
1 mg | Freq: Once | INTRAVENOUS | 0 refills | Status: CN | PRN
Start: 2023-07-03 — End: ?

## 2023-07-03 MED ORDER — LIDOCAINE (PF) 20 MG/ML (2 %) IJ SOLN
INTRAVENOUS | 0 refills | Status: DC
Start: 2023-07-03 — End: 2023-07-03

## 2023-07-03 NOTE — Progress Notes
 Pharmacy Medication Initial Assessment and Education    Indication/Regimen  The regimen of STELARA SC indefinitely is appropriate for Tina Snyder who has Crohn's disease (CMS-HCC).    Renal dose adjustments are not required. Hepatic dose adjustments are not required. Dose titration is required.    The following titration will be followed for Stelara: 390 mg IV induction dose, then 90 mg subcutaneously every 8 weeks starting 8 weeks after the IV induction dose. The patient will receive the IV induction dose on 07/04/2023.    The patient has the ability to self-administer the medication(s).    Baseline Characteristics  Disease severity: moderate-severe  Disease description: ileo-colonic  Additional clinical manifestations: fistulas  Medications being used concurrently: Stelara  Previously trialed agents: Remicade, humira, entyvio  Allergy and/or intolerance to IBD medications: sulfasalazine  Co-morbid conditions: NA  Additional considerations: NA  Baseline endoscopy: 06/10/23 : Scattered linear small aphtha in the distal ileum  Baseline pathology: 06/10/23: Superficial erosion with associated acute and chronic inflammation.    Therapeutic Goals and Monitoring  The goal of therapy is to control symptoms and achieve remission.    Past Medical History and Comorbidities  Patient Active Problem List   Diagnosis    Crohn's disease (CMS-HCC)    Hypertension    Depression    Anemia    Seizure disorder (CMS-HCC)    Mycobacterium avium-intracellulare complex (CMS-HCC)    Long term (current) use of insulin (HCC)    Enteroenteric fistula    CKD (chronic kidney disease) stage 3, GFR 30-59 ml/min (CMS-HCC)    SOB (shortness of breath)    Lymphedema of both arms    Pure hypercholesterolemia    OSA (obstructive sleep apnea)    Postthrombotic syndrome    Type 2 diabetes mellitus without complication, with long-term current use of insulin (HCC)    Closed fracture of right foot/leg wearing brace; with delayed healing 18Dec.2018, at noon dizzy, fell back wards with leg back under her; fractured R foot/leg; following with orthopedics    Bronchial atresia    Essential (hemorrhagic) thrombocythemia (HCC)    Lung nodule    Chronic respiratory failure with hypoxia (CMS-HCC)    Allergic contact dermatitis due to plants, except food    Multiple food allergies    Recurrent infections    Chronic rhinitis    Hypogammaglobulinemia    Prurigo nodularis    Major depressive disorder, recurrent, moderate (CMS-HCC)    Obesity (BMI 30-39.9)    Primary insomnia    Abnormal finding on lung imaging    Bronchiectasis (CMS-HCC)    Chronic pancreatitis, unspecified pancreatitis type (HCC)    Intractable nausea and vomiting     Additional comorbidities: no    Labs and Diagnostic Tests  Lab Results   Component Value Date/Time    WBC 5.40 07/03/2023 05:22 AM    RBC 3.46 (L) 07/03/2023 05:22 AM    HGB 10.3 (L) 07/03/2023 05:22 AM    HCT 31.0 (L) 07/03/2023 05:22 AM    MCV 89.6 07/03/2023 05:22 AM    MCH 29.9 07/03/2023 05:22 AM    MCHC 33.3 07/03/2023 05:22 AM    RDW 13.8 07/03/2023 05:22 AM    PLTCT 226 07/03/2023 05:22 AM    MPV 8.7 07/03/2023 05:22 AM       Lab Results   Component Value Date/Time    NEUT 73 05/09/2023 02:22 PM    ANC 6.70 05/09/2023 02:22 PM    LYMA 19 (L) 05/09/2023 02:22 PM  ALC 1.80 05/09/2023 02:22 PM    MONA 6 05/09/2023 02:22 PM    AMC 0.50 05/09/2023 02:22 PM    EOSA 1 05/09/2023 02:22 PM    AEC 0.10 05/09/2023 02:22 PM    BASA 0 05/09/2023 02:22 PM    ABC 0.00 05/09/2023 02:22 PM       Lab Results   Component Value Date/Time    NA 142 07/03/2023 05:22 AM    K 3.7 07/03/2023 05:22 AM    CL 108 07/03/2023 05:22 AM    CO2 24 07/03/2023 05:22 AM    GAP 10 07/03/2023 05:22 AM    BUN 19 07/03/2023 05:22 AM    CR 2.08 (H) 07/03/2023 05:22 AM    GLU 72 07/03/2023 05:22 AM       Lab Results   Component Value Date/Time    CA 8.7 07/03/2023 05:22 AM    PO4 3.4 07/03/2023 05:22 AM    ALBUMIN 3.4 (L) 07/03/2023 05:22 AM    TOTPROT 5.5 (L) 07/03/2023 05:22 AM    ALKPHOS 103 07/03/2023 05:22 AM    AST 15 07/03/2023 05:22 AM    ALT 10 07/03/2023 05:22 AM    TOTBILI 0.4 07/03/2023 05:22 AM    GFR 27 (L) 07/03/2023 05:22 AM    GFRAA 52 (L) 03/23/2020 01:04 PM       Lab Results   Component Value Date/Time    QUANTIFERTB SEE CHARTMAXX FOR COMPLETE REPORT. 09/11/2007 08:00 AM         Allergies  Allergies   Allergen Reactions    Bee Venom Protein (Honey Bee) ANAPHYLAXIS    Ciprofloxacin VOMITING    Enoxaparin SEE COMMENTS and UNKNOWN     Hemorrhage    Morphine HALLUCINATIONS and ANAPHYLAXIS     States she quits breathing    Warfarin SEE COMMENTS and UNKNOWN     Hemorrhage    Metoclopramide SEE COMMENTS and RASH     Patient states that she cannot tolerate.    Metronidazole HIVES    Bee [Bumble Bee] UNKNOWN    Ciprofloxacin NAUSEA AND VOMITING    Stadol [Butorphanol Tartrate] SEE COMMENTS     Patient states that it made her loopy and drunk.    Tysabri [Natalizumab] SEE COMMENTS     JC virus positive.  Do not use Tysabri - high risk for PML    Walnut UNKNOWN    Zyrtec [Cetirizine] UNKNOWN    Aspartame UNKNOWN    Butorphanol UNKNOWN    Farxiga [Dapagliflozin] SEE COMMENTS     Recurrent yeast infections    Pecan Nut UNKNOWN    Prochlorperazine HIVES, SEE COMMENTS and UNKNOWN     Tolerates Phenergan    Sulfa (Sulfonamide Antibiotics) HIVES, NAUSEA AND VOMITING and UNKNOWN    Sulfasalazine UNKNOWN    Topiramate UNKNOWN        Immunizations  Vaccine history was reviewed with the patient. Education was provided on the importance of completing vaccines. The patient should avoid live vaccines.    Immunization History   Administered Date(s) Administered    COVID-19 ([employer]JANSSEN) recombinant protein vacc, 0.5 mL (PF) 07/22/2019, 07/22/2019    COVID-19 (MODERNA), mRNA vacc, 100 mcg/0.5 mL (PF) 03/23/2020, 07/20/2020    COVID-19 Bivalent (38YR+)(PFIZER), mRNA vacc, 64mcg/0.3mL 10/11/2021    Covid-19 mRNA Vaccine >=12yo (Moderna)(Spikevax) 06/12/2022, 02/20/2023 FLU VACCINE >3YO 01/10/2011, 01/06/2012    FLU VACCINE >3YO (Preservative Free) 03/04/2011    Flu vaccine, inj unspecified (Historical) 05/17/2019, 02/06/2022    Pneumococcal  Vaccine (20-Val) 08/28/2022    Pneumococcal Vaccine (23-Val Adult) 02/26/2011, 09/19/2020    Tdap Vaccine 09/30/2017    Zoster Vaccine Recombinant, Adjuvanted (shingles) IM (vial 2 of 2)(SHINGRIX) 10/11/2021, 02/06/2022    Zoster Vaccine Recombinant, adjuvant suspension component (vial 1 of 2)(SHINGRIX) 10/11/2021, 02/06/2022       Medication Reconciliation  Medication history and reconciliation were performed (including prescription medications, supplements, over the counter, and herbal products). The medication list was updated and the patient's current medication list is included. The patient was instructed to speak with their health care provider before starting any new drug, including prescription or over the counter, natural / herbal products, or vitamins.    Drug Interactions    Drug-Drug Interactions  Drug-drug interactions were evaluated. There were not clinically significant drug-drug interactions.     Drug-Food Interactions  Drug-food interactions were not evaluated (NA - not oral).    Home Medications    Medication Sig   amLODIPine (NORVASC) 5 mg tablet TAKE 1 TABLET BY MOUTH EVERY DAY   ascorbic acid (VITAMIN C) 500 mg chewable tablet Chew two tablets by mouth daily.   atorvastatin (LIPITOR) 40 mg tablet TAKE 1 TABLET BY MOUTH EVERY DAY   budesonide (ENTOCORT EC) 3 mg capsule TAKE 2 CAPSULES BY MOUTH EVERY DAY   buPROPion XL (WELLBUTRIN XL) 300 mg tablet TAKE ONE TABLET BY MOUTH EVERY MORNING. DO NOT CRUSH OR CHEW.   chlorthalidone (HYGROTON) 25 mg tablet TAKE 1 TABLET BY MOUTH EVERY DAY   DEXCOM G7 SENSOR sensor device Use one each as directed every 10 days. Indications: type 2 diabetes mellitus   duloxetine DR (CYMBALTA) 60 mg capsule TAKE 1 CAPSULE BY MOUTH EVERY DAY   EPINEPHrine (AUVI-Q) 1 mg/mL injection pen (2-Pack) Inject 0.3 mg (1 Pen) into thigh if needed for anaphylactic reaction. May repeat in 5-15 minutes if needed.   famotidine (PEPCID) 40 mg tablet Take one tablet by mouth twice daily.   glucagon (BAQSIMI) 3 mg/actuation nasal spray Use 1 spray in single nostril. If no response, may repeat in 15 minutes using second device.   guaiFENesin LA (MUCINEX) 600 mg tablet Take one tablet by mouth twice daily as needed.   hydrOXYzine HCL (ATARAX) 25 mg tablet Take one tablet by mouth three times daily as needed (allergies).   insulin aspart (U-100) (NOVOLOG FLEXPEN U-100 INSULIN) 100 unit/mL (3 mL) PEN Inject five Units to ten Units under the skin three times daily with meals.   insulin glargine (BASAGLAR KWIKPEN U-100 INSULIN) 100 unit/mL (3 mL) subcutaneous PEN Inject thirty six Units under the skin daily.   levalbuterol tartrate (XOPENEX HFA) 45 mcg/actuation inhaler Inhale two puffs by mouth into the lungs every 4-6 hours as needed for Wheezing or Shortness of Breath.   losartan (COZAAR) 100 mg tablet TAKE 1 TABLET BY MOUTH EVERY DAY   multivit-min/iron/FA/vit K/lut (CENTRUM SILVER WOMEN PO) Take 1 tablet by mouth daily.   nebivoloL (BYSTOLIC) 10 mg tablet TAKE 1 TABLET BY MOUTH EVERY DAY   ondansetron (ZOFRAN ODT) 4 mg rapid dissolve tablet Dissolve one tablet by mouth every 8 hours as needed for Nausea or Vomiting. Place on tongue to dissolve.   pen needle, diabetic (BD NANO 2ND GEN PEN NEEDLE) 32 gauge x 5/32 pen needle Use with insulin injections four times daily  Indications: e11.22   polyethylene glycol 3350 (MIRALAX) 17 g packet Take one packet by mouth as Needed.   pramoxine-hydrocortisone (ANALPRAM-HC) 1-1 % rectal cream Insert or Apply one g to  rectal area as directed twice daily.  Patient taking differently: Insert or Apply one g to rectal area as directed twice daily as needed.   tirzepatide (MOUNJARO) 2.5 mg/0.5 mL injector PEN INJECT 0.5 ML UNDER THE SKIN EVERY 7 DAYS   ustekinumab (STELARA) 90 mg syringe Inject 1 mL under the skin every 8 weeks.   vonoprazan (VOQUEZNA) 10 mg tablet Take one tablet by mouth daily.     Adverse Drug Reactions  Patient was educated on common side effects.    Adherence  Patient was educated on the importance of adherence.    Safety Precautions    Risk Evaluation and Mitigation (REMS) Assessment: REMS is not required for this medication.    Safety precautions were addressed and discussed with the patient as applicable.    Contraindications: Tina Snyder does not have contraindications to this medication.    Pregnancy Status: Female, not of child-bearing potential, education not applicable.    Medication Education  The patient was counseled via telephone. 10 minutes were spent educating the patient.    Tina Snyder was provided with education on their specialty medication(s). Discussion with the patient included: the medication name (brand and generic), medication class, dosing, frequency, duration, route, proper administration, monitoring, common side effects, contraindications, safety precautions, and food/drug interactions to be aware of. The indication, expectations and possible outcomes from treatment were also discussed.     Appropriate storage, safe handling, and disposal directions were reviewed. The patient was educated on timely administration of therapy and management of missed doses. Adherence with therapy and the patient's ability to be adherent with drug therapies were discussed and the patient was provided options for tools/resources that promote adherence to therapy as needed. The patient's ability to self-administer the medication was assessed. Requirements of the REMS program were discussed with the patient as applicable. Recommended vaccinations were reviewed and discussed with the patient as applicable. The patient was instructed to seek medical attention immediately if they experience signs of an allergic reaction, including but not limited to: a rash; hives; itching; red, swollen, blistered, or peeling skin with or without fever.     Tina Snyder was given the opportunity to ask questions but did not have any questions at this time. Patient was reminded of the refill process and encouraged to call with questions. The monitoring and follow-up plan was discussed with the patient. The patient was instructed to contact their health care provider if their symptoms or health problems do not get better or if they become worse. For clinical questions about this medication, the pharmacist can be reached at [phone]. For questions about cost, insurance coverage, or to obtain refills, the patient should contact the pharmacy via MyChart or by calling [phone]. The patient verbalized acceptance and understanding.    Follow-up Plan  The patient will be reassessed within 30 days.    The medication(s) will be shipped from [facility].Patient will receive the medication(s) at [facility] infusion center rather than directly from a pharmacy.    Benedetto Coons, PHARMD

## 2023-07-03 NOTE — Care Plan
 Problem: Discharge Planning  Goal: Participation in plan of care  Outcome: Goal Ongoing  Goal: Knowledge regarding plan of care  Outcome: Goal Ongoing  Goal: Prepared for discharge  Outcome: Goal Ongoing     Problem: Infection, Risk of  Goal: Absence of infection  Outcome: Goal Ongoing  Goal: Knowledge of Infection Control Procedures  Outcome: Goal Ongoing

## 2023-07-03 NOTE — Progress Notes
 EGD/Upper EUS/ERCP/Antegrade Enteroscopy  Post Upper Endoscopy Instructions      -You may have a sore throat after the procedure for 2-3 days.  Try sucrets or lozenges to help ease the pain.  If it continues please contact us.    -If you feel feverish, have a temperature of 101 degrees or higher, persistent nausea and vomiting, abdominal pain or dark stools; please notify your nurse or GI physician.    -You may have abdominal cramping following the procedure this can be relieved by belching or passing air.    -If you have redness or swelling at the IV site, place a warm, wet washcloth over the affected areas for 15 minutes, 3-4 times a day until the redness subsides.  If symptoms continue for 2-3 days, contact your regular physician.    - If you have bleeding from your mouth, over 2 tablespoons and increasing, please notify your physician.  A small amount of bleeding is normal if a biopsy or polyps were taken.  If you are vomiting blood you need to seek immediate medical attention.    - You may resume all your routine medications, if medications need to be held your physician and/or nurse will notify you post procedure.          SPECIFIC INSTRUCTIONS    INPATIENTS:  Ask for help when you get up in your room, as you may still be drowsy from your sedation.

## 2023-07-03 NOTE — Progress Notes
 [facility] END OF SHIFT/ JHFRAT NOTE    Admission Date: 06/30/2023    Acute events, interventions, provider communication: pt complains of abdominal spasm, MD notified and ordered bentyl.    Patient Interventions and Education  Fall Risk/JHFRAT Interventions and Education: (Charting when applicable)  Elimination Interventions : Communicate timing of laxatives/diuretics with assistive personnel to support proactive elimination needs.   Medications : Educate patient on medication side effects  Patient Care Equipment: Does not need assistance with patient care equipment when ambulating, Ensure environment is free of clutter and walkways are clear from tripping hazards, and Assess need for patient equipment and remove if not in use  Mobility: N/A  Cognition: N/A  Risk for Moderate/Major Injury: N/A    2. Restraints:  No     Restraint Goal: Patient will be free from injury while physically restrained.  See Docflowsheet for restraint documentation, interventions, education, etc.    Intake and Output:       Intake/Output Summary (Last 24 hours) at 07/03/2023 0600  Last data filed at 07/02/2023 2048  Gross per 24 hour   Intake 900 ml   Output --   Net 900 ml              Last Bowel Movement Date:  (PTA)

## 2023-07-03 NOTE — Discharge Instructions - Pharmacy
 Discharge Summary      Name: Tina Snyder  Medical Record Number: [id number]        Account Number:  [id number]  Date Of Birth:  1966-04-16                         Age:  57 y.o.  Admit date:  06/30/2023                     Discharge date: 07/03/2023      Discharge Attending:  Bennie Hind  Discharge Summary Completed By: Bronson Ing, MD    Service: Med Private [id number]    Reason for hospitalization:  Intractable nausea and vomiting [R11.2]    Primary Discharge Diagnosis:   Intractable nausea and vomiting    Hospital Diagnoses:  Hospital Problems        Active Problems    * (Principal) Intractable nausea and vomiting     Present on Admission:   Intractable nausea and vomiting        Significant Past Medical History        Anxiety  Bleeding tendency  Blood clot in vein      Comment:  subclavian  Bowel disease  Cerebral artery occlusion with cerebral infarction (CMS-HCC)  CKD (chronic kidney disease) stage 3, GFR 30-59 ml/min (CMS-HCC)  Constipation  Crohn disease (CMS-HCC)  Deep vein thrombosis (DVT) (CMS-HCC)  Depression  Difficult intravenous access  Diverticulitis of colon (without mention of hemorrhage)(562.11)  Dizziness  DM (diabetes mellitus), secondary-steroid-induced  DVT (deep venous thrombosis) (CMS-HCC)  Fibromyalgia  GERD (gastroesophageal reflux disease)  H/O Mycobacterium avium intracellulare infection  Hearing loss  Heart murmur  Hyperlipidemia  Hypertension  Hypertensive heart disease  Irritable bowel disease  Joint pain  Lung disease  Migraines  Nausea  Occlusion of left subclavian vein (CMS-HCC)  Pancreatitis  Pulmonary embolism (CMS-HCC)  SBO (small bowel obstruction) (CMS-HCC)  Seasonal allergic reaction  Seizure disorder (CMS-HCC)  Small bowel obstruction (CMS-HCC)  Steatohepatitis      Comment:  felt secondary to medications  Type 2 diabetes mellitus without complication, with long-term current   use of insulin (HCC)  Ulcer  Vaginal tumor    Allergies   Bee venom protein (honey bee), Ciprofloxacin, Enoxaparin, Morphine, Warfarin, Metoclopramide, Metronidazole, Bee [bumble bee], Ciprofloxacin, Stadol [butorphanol tartrate], Tysabri [natalizumab], Walnut, Zyrtec [cetirizine], Aspartame, Butorphanol, Farxiga [dapagliflozin], Pecan nut, Prochlorperazine, Sulfa (sulfonamide antibiotics), Sulfasalazine, and Topiramate    Brief Hospital Course   The patient was admitted and the following issues were addressed during this hospitalization: (with pertinent details including admission exam/imaging/labs).      Tina Snyder is a 58 year old woman admitted with intractable nausea, vomiting, abdominal pain, diarrhea. She has crohn's disease, chronic pancreatitis, type 2 diabetes, depression, GERD.  CT abdomen pelvis showed no acute findings and did show some retained stool.  GI was consulted and recommended GoLytely bowel purge.  Patient completed this with significant number of bowel movements with slight worsening of symptoms.  Repeat KUB showed retained rectal stool but otherwise no constipation.  Given ongoing symptoms EGD was completed and showed erythematous mucosa in the antrum which was biopsied as well as prior fundoplication which was loose.  This could be potential etiology of pain.  To address burning abdominal pain outpatient GI provider had recently switched patient to Voquezna which she will start after discharge.  Patient can continue to use Tylenol as needed for pain.  Patient with mild AKI on admission.  Also had been holding PTA hypertensives with decreased p.o. intake.  These were held on admission and patient had normal blood pressure throughout her stay.  Patient instructed to check her blood pressure daily and resume antihypertensives 1 at a time for persistent blood pressure greater than 150/100.         Day of discharge exam notable for:   General: Laying in bed, alert, comfortable  HEENT: Normocephalic, anicteric, moist  Abdomen: Mild epigastric tenderness, normal bowel sounds, no rebound or guarding  Neuro: Normal speech, moves all extremities equally, alert    Items Needing Follow Up   Pending items or areas that need to be addressed at follow up:     Twice daily follow-up biopsies from EGD on 3/13    Pending Labs and Follow Up Radiology    Pending labs and/or radiology review at this time of discharge are listed below: Please note- any labs with collected status will not have a result; if this area is blank, there are no items for review.   Pending Labs       Order Current Status    CALPROTECTIN, FECAL In process    SURGICAL PATHOLOGY In process              Medications      Medication List      PAUSE taking these medications     amLODIPine 5 mg tablet; Wait to take this until your doctor or other   care provider tells you to start again.; Commonly known as: NORVASC; Dose:   5 mg; TAKE 1 TABLET BY MOUTH EVERY DAY; Quantity: 90 tablet; Refills: 1   chlorthalidone 25 mg tablet; Wait to take this until your doctor or   other care provider tells you to start again.; Commonly known as:   HYGROTON; Dose: 25 mg; TAKE 1 TABLET BY MOUTH EVERY DAY; Quantity: 90   tablet; Refills: 3   losartan 100 mg tablet; Wait to take this until your doctor or other   care provider tells you to start again.; Commonly known as: COZAAR; Dose:   100 mg; TAKE 1 TABLET BY MOUTH EVERY DAY; Quantity: 90 tablet; Refills: 1   nebivoloL 10 mg tablet; Wait to take this until your doctor or other   care provider tells you to start again.; Commonly known as: BYSTOLIC;   Dose: 10 mg; TAKE 1 TABLET BY MOUTH EVERY DAY; Quantity: 90 tablet;   Refills: 1     CONTINUE taking these medications     ascorbic acid 500 mg chewable tablet; Commonly known as: VITAMIN C;   Dose: 1,000 mg; Refills: 0   atorvastatin 40 mg tablet; Commonly known as: LIPITOR; Dose: 40 mg; TAKE   1 TABLET BY MOUTH EVERY DAY; Quantity: 90 tablet; Refills: 1   BAQSIMI 3 mg/actuation nasal spray; Generic drug: glucagon; Use 1 spray   in single nostril. If no response, may repeat in 15 minutes using second   device.; Quantity: 2 each; Refills: 0   BASAGLAR KWIKPEN U-100 INSULIN 100 unit/mL (3 mL) subcutaneous PEN;   Generic drug: insulin glargine; Dose: 36 Units; Doctor's comments: The   pharmacist may select Lantus Solorstar or Elon Jester based on what   is cheaper for the patient.; Inject thirty six Units under the skin   daily.; Quantity: 30 mL; Refills: 3   budesonide 3 mg capsule; Commonly known as: ENTOCORT EC; TAKE 2 CAPSULES   BY MOUTH EVERY DAY; Quantity:  180 capsule; Refills: 1   buPROPion XL 300 mg tablet; Commonly known as: WELLBUTRIN XL; Dose: 300   mg; TAKE ONE TABLET BY MOUTH EVERY MORNING. DO NOT CRUSH OR CHEW.;   Quantity: 90 tablet; Refills: 1   CENTRUM SILVER WOMEN PO; Dose: 1 tablet; Refills: 0   DEXCOM G7 SENSOR sensor device; Generic drug: blood-glucose sensor;   Dose: 1 each; Use one each as directed every 10 days. Indications: type 2   diabetes mellitus; For: type 2 diabetes mellitus; Quantity: 9 each;   Refills: 3   duloxetine DR 60 mg capsule; Commonly known as: CYMBALTA; Dose: 60 mg;   TAKE 1 CAPSULE BY MOUTH EVERY DAY; Quantity: 90 capsule; Refills: 3   ENTYVIO IV; Refills: 0   EPINEPHrine 1 mg/mL injection pen (2-Pack); Commonly known as: AUVI-Q;   Inject 0.3 mg (1 Pen) into thigh if needed for anaphylactic reaction. May   repeat in 5-15 minutes if needed.; Quantity: 2 each; Refills: 1   famotidine 40 mg tablet; Commonly known as: PEPCID; Dose: 40 mg; Take   one tablet by mouth twice daily.; Quantity: 180 tablet; Refills: 3   guaiFENesin LA 600 mg tablet; Commonly known as: MUCINEX; Dose: 600 mg;   Refills: 0   hydrOXYzine HCL 25 mg tablet; Commonly known as: ATARAX; Dose: 25 mg;   Refills: 0   insulin aspart (U-100) 100 unit/mL (3 mL) PEN; Commonly known as:   NovoLOG Flexpen U-100 Insulin; Dose: 5-10 Units; Inject five Units to ten   Units under the skin three times daily with meals.; Quantity: 15 mL;   Refills: 3   levalbuterol tartrate 45 mcg/actuation inhaler; Commonly known as:   XOPENEX HFA; Dose: 90 mcg; Inhale two puffs by mouth into the lungs every   4-6 hours as needed for Wheezing or Shortness of Breath.; Quantity: 15 g;   Refills: 11   MOUNJARO 2.5 mg/0.5 mL injector PEN; Generic drug: tirzepatide; Dose:   2.5 mg; Doctor's comments: E11.22; INJECT 0.5 ML UNDER THE SKIN EVERY 7   DAYS; Quantity: 6 mL; Refills: 1   ondansetron 4 mg rapid dissolve tablet; Commonly known as: ZOFRAN ODT;   Dose: 4 mg; Dissolve one tablet by mouth every 8 hours as needed for   Nausea or Vomiting. Place on tongue to dissolve.; Quantity: 30 tablet;   Refills: 1   pen needle, diabetic 32 gauge x 5/32 pen needle; Commonly known as: BD   NANO 2nd GEN PEN NEEDLE; Use with insulin injections four times daily    Indications: e11.22; For: e11.22; Quantity: 400 each; Refills: 2   polyethylene glycol 3350 17 g packet; Commonly known as: MIRALAX; Dose:   17 g; Refills: 0   pramoxine-hydrocortisone 1-1 % rectal cream; Commonly known as:   ANALPRAM-HC; Dose: 1 g; Insert or Apply one g to rectal area as directed   twice daily.; Quantity: 30 g; Refills: 1   STELARA 90 mg/mL syringe; Generic drug: ustekinumab; Dose: 90 mg; Inject   1 mL under the skin every 8 weeks.; Quantity: 1 mL; Refills: 3   VOQUEZNA 10 mg tablet; Generic drug: vonoprazan; Dose: 10 mg; Take one   tablet by mouth daily.; Quantity: 30 tablet; Refills: 11     STOP taking these medications     ALEVE 220 mg tablet; Generic drug: naproxen sodium   dexlansoprazole 60 mg capsule; Commonly known as: DEXILANT   insulin degludec 100 unit/mL (3 mL) subcutaneous PEN; Commonly known as:   TRESIBA FLEXTOUCH U-100  Return Appointments and Scheduled Appointments     Scheduled appointments:      Aug 25, 2023 2:00 PM  Office visit with Leanne Lovely, PHARMD  Endocrinology: Medical [facility] (Internal Medicine) [address]  [address]  [place] [address]  [phone]     Oct 16, 2023 10:30 AM  Office visit with Shelly Flatten, MBBS  Nephrology: Medical [facility] (Internal Medicine) [address]  [address]  [place] [address]  [phone]     Oct 31, 2023 11:30 AM  Office visit with Amadeo Garnet, APRN-NP  Gastroenterology: Medical [facility] (Internal Medicine) [address]  [address]  [place] [address]  [phone]     Nov 26, 2023 2:00 PM  Office visit with Rod Can, PA-C  Endocrinology: Medical [facility] (Internal Medicine) [address]  [address]  [place] [address]  [phone]   Additional appointment instructions:    Please check your blood pressure one daily or every other day. If blood pressure is > 150-100 please resume medications one at a time in the following order:    Amlodipine  Losartan  Chlorthalidone   Nebivolol            Additional Discharge Appointments    Please check your blood pressure one daily or every other day. If blood pressure is > 150-100 please resume medications one at a time in the following order:    Amlodipine  Losartan  Chlorthalidone   Nebivolol            Consults, Procedures, Diagnostics, Micro, Pathology   Consults: GI  Surgical Procedures & Dates: None  Significant Diagnostic Studies, Micro and Procedures: noted in brief hospital course  Significant Pathology: noted in brief hospital course                       Discharge Disposition, Condition   Patient Disposition: Home or Self Care [01]  Condition at Discharge: Stable    Code Status   Full Code    Patient Instructions     Activity       Activity as Tolerated   As directed      It is important to keep increasing your activity level after you leave the hospital.  Moving around can help prevent blood clots, lung infection (pneumonia) and other problems.  Gradually increasing the number of times you are up moving around will help you return to your normal activity level more quickly.  Continue to increase the number of times you are up to the chair and walking daily to return to your normal activity level. Begin to work toward your normal activity level at discharge          Diet       Regular Diet   As directed      You have no dietary restriction. Please continue with a healthy balanced diet.             Discharge education provided to patient.    Additional Orders: Case Management, Supplies, Home Health     Home Health/DME       None              Signed:  Bronson Ing, MD  07/03/2023      cc:  Primary Care Physician:  Baruch Gouty   Verified    Referring physicians:  No ref. provider found  Additional provider(s):        Did we miss something? If additional records are needed, please fax a request on office letterhead to [phone]. Please include the patient's name, date of birth, fax number and type of information needed. Additional request can be made by email at [email or web address]. For general questions of information about electronic records sharing, call [phone].     Total Time Today was 50 minutes

## 2023-07-03 NOTE — Anesthesia Post-Procedure Evaluation
 Post-Anesthesia Evaluation    Name: Tina Snyder      MRN: [id number]     DOB: November 22, 1965     Age: 58 y.o.     Sex: female   __________________________________________________________________________     Procedure Information       Anesthesia Start Date/Time: 07/03/23 0820    Procedure: ESOPHAGOGASTRODUODENOSCOPY WITH BIOPSY - FLEXIBLE    Location: ENDO 3 / ENDO/GI    Surgeons: Lenor Derrick, MD            Post-Anesthesia Vitals  BP: 107/57 (03/13 0855)  Temp: 36.8 ?C (98.3 ?F) (03/13 4540)  Pulse: 63 (03/13 0855)  Respirations: 15 PER MINUTE (03/13 0855)  SpO2: 94 % (03/13 0855)  O2 Device: None (Room air) (03/13 0833)   Vitals Value Taken Time   BP 107/57 07/03/23 0855   Temp 36.8 ?C (98.3 ?F) 07/03/23 0833   Pulse 63 07/03/23 0855   Respirations 15 PER MINUTE 07/03/23 0855   SpO2 94 % 07/03/23 0855   O2 Device None (Room air) 07/03/23 0833   ABP     ART BP           Post Anesthesia Evaluation Note    Evaluation location: Pre/Post  Patient participation: recovered; patient participated in evaluation  Level of consciousness: alert  Pain management: adequate    Hydration: normovolemia  Temperature: 36.0?C - 38.4?C  Airway patency: adequate    Perioperative Events       Post-op nausea and vomiting: no PONV    Postoperative Status  Cardiovascular status: hemodynamically stable  Respiratory status: spontaneous ventilation  Follow-up needed: none        Perioperative Events  There were no known complications for this encounter.

## 2023-07-03 NOTE — Discharge Instructions - Appointments
 Please check your blood pressure one daily or every other day. If blood pressure is > 150-100 please resume medications one at a time in the following order:    Amlodipine  Losartan  Chlorthalidone   Nebivolol

## 2023-07-04 ENCOUNTER — Encounter: Admit: 2023-07-04 | Discharge: 2023-07-04 | Payer: BC Managed Care – PPO

## 2023-07-04 ENCOUNTER — Ambulatory Visit: Admit: 2023-07-04 | Discharge: 2023-07-04 | Payer: BC Managed Care – PPO

## 2023-07-04 LAB — SURGICAL PATHOLOGY

## 2023-07-04 MED ORDER — USTEKINUMAB IVPB
390 mg | Freq: Once | INTRAVENOUS | 0 refills | Status: CP
Start: 2023-07-04 — End: ?
  Administered 2023-07-04 (×2): 390 mg via INTRAVENOUS

## 2023-07-04 MED ORDER — USTEKINUMAB IVPB
390 mg | Freq: Once | INTRAVENOUS | 0 refills | Status: CN
Start: 2023-07-04 — End: ?

## 2023-07-04 NOTE — Progress Notes
 Pt arrived for Stelera infusion. Tolerated well. No new concerns this visit. Ambulated from appointment.

## 2023-07-04 NOTE — Telephone Encounter
 Hospital Discharge Follow Up      Reached Patient: Yes, patient was identified, for their safety, using dual identification of name and date of birth  Patient Date of Birth: January 17, 1966     Admission Information:     H[facility]  Admission Date: 06/30/2023    Discharge Date: 07/03/2023    Admission Diagnosis:  Nausea/vomiting/diarrhea; intractable nausea and vomiting  Discharge Diagnosis:  (Primary)Intractable nausea and vomiting , Obesity BMI 30-39.9  Surgery: 07/03/2023 Esophagogastroduodenoscopy with biopsy-flexible  Has there been a discharge within the last 30 days? No N/A  Hospital Services: Unplanned  Today's call is 1 (business) days post discharge      Discharge Instruction Review   Did patient receive and understand discharge instructions? Yes    Home Health ordered? No  Caregiver assistance in the home? No   Are there concerns regarding the patient's ADL'S? No  Is patient a fall risk? No    Special diet? No Regular diet  Discharge issues and concerns: No barriers      Medication Reconciliation    Changes to pre-hospital medications? Yes  PAUSE taking:  amlodipine 5 mg tablet (NORVASC)  chlorthalidone 25 mg tablet (HYGROTON)  losartan 100 mg tablet (COZAAR)  nebivolol 10 mg tablet (BYSTOLIC)  STOP taking:  ALEVE 220 mg tablet (naproxen sodium)  dexlansoprazole 60 mg capsule (DEXILANT)  insulin degludec 100 unit/mL (3 mL) subcutaneous  PEN (TRESIBA FLEXTOUCH U-100)  Were new prescriptions filled? No,  due to no appointments scheduled  Meds reviewed and reconciled? Yes    Current Outpatient Medications   Medication Instructions    [Paused] amLODIPine (NORVASC) 5 mg, Oral, DAILY    ascorbic acid (VITAMIN C) 1,000 mg, DAILY    atorvastatin (LIPITOR) 40 mg, Oral, DAILY    BASAGLAR KWIKPEN U-100 INSULIN 36 Units, Subcutaneous, DAILY    budesonide (ENTOCORT EC) 3 mg capsule TAKE 2 CAPSULES BY MOUTH EVERY DAY    buPROPion XL (WELLBUTRIN XL) 300 mg, Oral, EVERY MORNING, Do not crush or chew.    [Paused] chlorthalidone (HYGROTON) 25 mg, Oral, DAILY    DEXCOM G7 SENSOR sensor device 1 each, Misc.(Non-Drug; Combo Route), EVERY 10 DAYS    duloxetine DR (CYMBALTA) 60 mg, Oral, DAILY    EPINEPHrine (AUVI-Q) 1 mg/mL injection pen (2-Pack) Inject 0.3 mg (1 Pen) into thigh if needed for anaphylactic reaction. May repeat in 5-15 minutes if needed.    famotidine (PEPCID) 40 mg, Oral, TWICE DAILY    glucagon (BAQSIMI) 3 mg/actuation nasal spray Use 1 spray in single nostril. If no response, may repeat in 15 minutes using second device.    guaiFENesin LA (MUCINEX) 600 mg, TWICE DAILY PRN    hydrOXYzine HCL (ATARAX) 25 mg, THREE TIMES DAILY PRN    insulin aspart (U-100) (NOVOLOG FLEXPEN U-100 INSULIN) 5-10 Units, Subcutaneous, THREE TIMES DAILY WITH MEALS    levalbuterol tartrate (XOPENEX HFA) 90 mcg, Inhalation, EVERY  4-6 HOURS PRN    [Paused] losartan (COZAAR) 100 mg, Oral, DAILY    MOUNJARO 2.5 mg, Subcutaneous, EVERY  7 DAYS    multivit-min/iron/FA/vit K/lut (CENTRUM SILVER WOMEN PO) 1 tablet, DAILY    [Paused] nebivoloL (BYSTOLIC) 10 mg, Oral, DAILY    ondansetron (ZOFRAN ODT) 4 mg, Oral, EVERY  8 HOURS PRN, Place on tongue to dissolve.    pen needle, diabetic (BD NANO 2ND GEN PEN NEEDLE) 32 gauge x 5/32 pen needle Use with insulin injections four times daily    polyethylene glycol  3350 (MIRALAX) 17 g, AS NEEDED    pramoxine-hydrocortisone (ANALPRAM-HC) 1-1 % rectal cream 1 g, Rectal, TWICE DAILY    STELARA 90 mg, Subcutaneous, Every 8 Weeks    VOQUEZNA 10 mg, Oral, DAILY       Understanding Condition   Having any current symptoms? Yes, Patient states that she still has nausea but no vomiting and states she still has abdominal pain that feels like a fist punch.  Patient denies fever/chill, diarrhea/constipation.    Do you have a history of Heart Failure? No    Patient understands when to seek additional medical care? Yes   Other items discussed: Activity as tolerated Scheduling Follow-up Appointment   Upcoming appointments:   Future Appointments   Date Time Provider Department Center   07/04/2023  1:15 PM INF12 [facility] None   07/07/2023  2:20 PM Laveda Abbe R, DO [facility]   08/25/2023  2:00 PM Leanne Lovely, PHARMD [facility]   10/16/2023 10:30 AM Shelly Flatten, MBBS [facility]   10/31/2023 11:30 AM Amadeo Garnet, APRN-NP [facility]   11/26/2023  2:00 PM Rod Can, PA-C [facility]     Does the patient require 7 day follow up appointment? No  Hospital Follow-Up scheduled with PCP? Yes, Date: 07/07/2023   When was patient?s last PCP visit: 02/20/2023   PCP primary location: [facility]  Specialist appointment scheduled? Yes, with Diabetes 08/25/2023 and 11/26/2023; Nephro 10/16/2023; Gastro 10/31/2023  Is assistance with transportation needed? No   MyChart message sent? Active in MyChart. No message sent.   Artera text sent? No    Yancey Flemings, RN

## 2023-07-05 ENCOUNTER — Encounter: Admit: 2023-07-05 | Discharge: 2023-07-05 | Payer: BC Managed Care – PPO

## 2023-07-05 DIAGNOSIS — K5080 Crohn's disease of both small and large intestine without complications: Secondary | ICD-10-CM

## 2023-07-05 LAB — CALPROTECTIN, FECAL: ~~LOC~~ BKR CALPROTECTIN, FECAL: 143 ug/g — ABNORMAL HIGH (ref <=49)

## 2023-07-06 ENCOUNTER — Encounter: Admit: 2023-07-06 | Discharge: 2023-07-06 | Payer: BC Managed Care – PPO

## 2023-07-06 MED FILL — DEXCOM G7 SENSOR MISC DEVI: 30 days supply | Qty: 3 | Fill #11 | Status: AC

## 2023-07-07 ENCOUNTER — Encounter: Admit: 2023-07-07 | Discharge: 2023-07-07 | Payer: BC Managed Care – PPO

## 2023-07-07 ENCOUNTER — Ambulatory Visit: Admit: 2023-07-07 | Discharge: 2023-07-08 | Payer: BC Managed Care – PPO

## 2023-07-09 ENCOUNTER — Encounter: Admit: 2023-07-09 | Discharge: 2023-07-09 | Payer: BC Managed Care – PPO

## 2023-07-12 ENCOUNTER — Encounter: Admit: 2023-07-12 | Discharge: 2023-07-12 | Payer: BC Managed Care – PPO

## 2023-07-14 ENCOUNTER — Encounter: Admit: 2023-07-14 | Discharge: 2023-07-14 | Payer: BC Managed Care – PPO

## 2023-07-17 ENCOUNTER — Encounter: Admit: 2023-07-17 | Discharge: 2023-07-17 | Payer: BC Managed Care – PPO

## 2023-07-29 ENCOUNTER — Encounter: Admit: 2023-07-29 | Discharge: 2023-07-29

## 2023-07-30 ENCOUNTER — Encounter: Admit: 2023-07-30 | Discharge: 2023-07-30

## 2023-07-31 ENCOUNTER — Encounter: Admit: 2023-07-31 | Discharge: 2023-07-31

## 2023-07-31 MED FILL — DEXCOM G7 SENSOR MISC DEVI: 30 days supply | Qty: 3 | Fill #12 | Status: AC

## 2023-08-04 ENCOUNTER — Encounter: Admit: 2023-08-04 | Discharge: 2023-08-04 | Payer: BLUE CROSS/BLUE SHIELD

## 2023-08-04 NOTE — Progress Notes
 Pharmacy Medication Reassessment    Indication/Regimen  The regimen of STELARA SC indefinitely is appropriate for Tina Snyder who has Crohn's disease (CMS-HCC).    Renal dose adjustments are not required. Hepatic dose adjustments are not required. Dose titration is required.    The following titration will be followed for Stelara: 390 mg IV induction dose, then 90 mg subcutaneously every 8 weeks starting 8 weeks after the IV induction dose. The patient will receive the IV induction dose on 07/04/2023.    The patient has the ability to self-administer the medication(s).    Baseline Characteristics  Disease severity: moderate-severe  Disease description: ileo-colonic  Additional clinical manifestations: fistulas  Medications being used concurrently: Stelara  Previously trialed agents: Remicade, humira, entyvio  Allergy and/or intolerance to IBD medications: sulfasalazine  Co-morbid conditions: NA  Additional considerations: NA  Baseline endoscopy: 06/10/23 : Scattered linear small aphtha in the distal ileum  Baseline pathology: 06/10/23: Superficial erosion with associated acute and chronic inflammation.    Therapeutic Goals and Monitoring  The goal of therapy is to control symptoms and achieve remission.    Disease activity (patient reported): constantly active  Patient reported improvement: 0-24 percent improvement in symptoms since starting therapy  Clinical symptoms: abdominal pain, bloating, constipation and nausea  Bowel movements per day: 1-3  Steroid course in past 90 days: yes (Taper course)  Flare (past 90 days): no  Patient is not currently in clinical remission. Pt denies any changes to date in symptoms;    Endoscopic Evaluation  Endoscopic procedure completed: no (no updated endoscopic procedure)    Histologic Evaluation  Test completed: no (no updated biopsy)    Evaluation  The patient is not making progress toward achieving their therapeutic goal, due to the following reasons: too soon to assess. The plan is to continue current therapy.    Past Medical History and Comorbidities  Patient Active Problem List   Diagnosis    Crohn's disease (CMS-HCC)    Hypertension    Depression    Anemia    Seizure disorder (CMS-HCC)    Mycobacterium avium-intracellulare complex (CMS-HCC)    Long term (current) use of insulin (CMS-HCC)    Enteroenteric fistula    CKD (chronic kidney disease) stage 3, GFR 30-59 ml/min (CMS-HCC)    SOB (shortness of breath)    Lymphedema of both arms    Pure hypercholesterolemia    OSA (obstructive sleep apnea)    Postthrombotic syndrome    Type 2 diabetes mellitus without complication, with long-term current use of insulin (CMS-HCC)    Closed fracture of right foot/leg wearing brace; with delayed healing 18Dec.2018, at noon dizzy, fell back wards with leg back under her; fractured R foot/leg; following with orthopedics    Bronchial atresia    Essential (hemorrhagic) thrombocythemia (CMS-HCC)    Lung nodule    Chronic respiratory failure with hypoxia (CMS-HCC)    Allergic contact dermatitis due to plants, except food    Multiple food allergies    Recurrent infections    Chronic rhinitis    Hypogammaglobulinemia    Prurigo nodularis    Major depressive disorder, recurrent, moderate (CMS-HCC)    Obesity (BMI 30-39.9)    Primary insomnia    Abnormal finding on lung imaging    Bronchiectasis (CMS-HCC)    Chronic pancreatitis, unspecified pancreatitis type (CMS-HCC)    Intractable nausea and vomiting     Additional comorbidities: no    Labs and Diagnostic Tests  Lab Results   Component Value Date/Time  WBC 5.40 07/03/2023 05:22 AM    RBC 3.46 (L) 07/03/2023 05:22 AM    HGB 10.3 (L) 07/03/2023 05:22 AM    HCT 31.0 (L) 07/03/2023 05:22 AM    MCV 89.6 07/03/2023 05:22 AM    MCH 29.9 07/03/2023 05:22 AM    MCHC 33.3 07/03/2023 05:22 AM    RDW 13.8 07/03/2023 05:22 AM    PLTCT 226 07/03/2023 05:22 AM    MPV 8.7 07/03/2023 05:22 AM       Lab Results   Component Value Date/Time    NEUT 73 05/09/2023 02:22 PM ANC 6.70 05/09/2023 02:22 PM    LYMA 19 (L) 05/09/2023 02:22 PM    ALC 1.80 05/09/2023 02:22 PM    MONA 6 05/09/2023 02:22 PM    AMC 0.50 05/09/2023 02:22 PM    EOSA 1 05/09/2023 02:22 PM    AEC 0.10 05/09/2023 02:22 PM    BASA 0 05/09/2023 02:22 PM    ABC 0.00 05/09/2023 02:22 PM       Lab Results   Component Value Date/Time    NA 142 07/03/2023 05:22 AM    K 3.7 07/03/2023 05:22 AM    CL 108 07/03/2023 05:22 AM    CO2 24 07/03/2023 05:22 AM    GAP 10 07/03/2023 05:22 AM    BUN 19 07/03/2023 05:22 AM    CR 2.08 (H) 07/03/2023 05:22 AM    GLU 72 07/03/2023 05:22 AM       Lab Results   Component Value Date/Time    CA 8.7 07/03/2023 05:22 AM    PO4 3.4 07/03/2023 05:22 AM    ALBUMIN 3.4 (L) 07/03/2023 05:22 AM    TOTPROT 5.5 (L) 07/03/2023 05:22 AM    ALKPHOS 103 07/03/2023 05:22 AM    AST 15 07/03/2023 05:22 AM    ALT 10 07/03/2023 05:22 AM    TOTBILI 0.4 07/03/2023 05:22 AM    GFR 27 (L) 07/03/2023 05:22 AM    GFRAA 52 (L) 03/23/2020 01:04 PM       Lab Results   Component Value Date/Time    QUANTIFERTB SEE CHARTMAXX FOR COMPLETE REPORT. 09/11/2007 08:00 AM         Allergies  Allergies   Allergen Reactions    Bee Venom Protein (Honey Bee) ANAPHYLAXIS    Ciprofloxacin  VOMITING    Enoxaparin SEE COMMENTS and UNKNOWN     Hemorrhage    Morphine HALLUCINATIONS and ANAPHYLAXIS     States she quits breathing    Warfarin SEE COMMENTS and UNKNOWN     Hemorrhage    Metoclopramide SEE COMMENTS and RASH     Patient states that she cannot tolerate.    Metronidazole HIVES    Bee [Bumble Bee] UNKNOWN    Ciprofloxacin  NAUSEA AND VOMITING    Stadol [Butorphanol Tartrate] SEE COMMENTS     Patient states that it made her loopy and drunk.    Tysabri [Natalizumab] SEE COMMENTS     JC virus positive.  Do not use Tysabri - high risk for PML    Walnut UNKNOWN    Zyrtec [Cetirizine] UNKNOWN    Aspartame UNKNOWN    Butorphanol UNKNOWN    Farxiga  [Dapagliflozin ] SEE COMMENTS     Recurrent yeast infections    Pecan Nut UNKNOWN Prochlorperazine HIVES, SEE COMMENTS and UNKNOWN     Tolerates Phenergan     Sulfa (Sulfonamide Antibiotics) HIVES, NAUSEA AND VOMITING and UNKNOWN    Sulfasalazine UNKNOWN    Topiramate UNKNOWN  Immunizations  Vaccine history was reviewed with the patient. Education was provided on the importance of completing vaccines. The patient should avoid live vaccines.    Immunization History   Administered Date(s) Administered    COVID-19 ([employer] J[employer]ON[employer]JANSSEN) recombinant protein vacc, 0.5 mL (PF) 07/22/2019, 07/22/2019    COVID-19 (MODERNA), mRNA vacc, 100 mcg/0.5 mL (PF) 03/23/2020, 07/20/2020    COVID-19 Bivalent (76YR+)(PFIZER), mRNA vacc, 51mcg/0.3mL 10/11/2021    Covid-19 mRNA Vaccine >=12yo (Moderna)(Spikevax) 06/12/2022, 02/20/2023    FLU VACCINE >3YO 01/10/2011, 01/06/2012    FLU VACCINE >3YO (Preservative Free) 03/04/2011    Flu vaccine, inj unspecified (Historical) 05/17/2019, 02/06/2022    Pneumococcal Vaccine (20-Val) 08/28/2022    Pneumococcal Vaccine (23-Val Adult) 02/26/2011, 09/19/2020    Tdap Vaccine 09/30/2017    Zoster Vaccine Recombinant, Adjuvanted (shingles) IM (vial 2 of 2)(SHINGRIX) 10/11/2021, 02/06/2022    Zoster Vaccine Recombinant, adjuvant suspension component (vial 1 of 2)(SHINGRIX) 10/11/2021, 02/06/2022       Medication Reconciliation  Medication history and reconciliation were performed (including prescription medications, supplements, over the counter, and herbal products). The medication list was updated and the patient's current medication list is included. The patient was instructed to speak with their health care provider before starting any new drug, including prescription or over the counter, natural / herbal products, or vitamins.    Drug Interactions    Drug-Drug Interactions  Drug-drug interactions were evaluated. There were not clinically significant drug-drug interactions.     Drug-Food Interactions  Drug-food interactions were not evaluated (NA - not oral).    Home Medications    Medication Sig   amLODIPine  (NORVASC ) 5 mg tablet TAKE 1 TABLET BY MOUTH EVERY DAY  Patient not taking: Reported on 07/07/2023   ascorbic acid (VITAMIN C) 500 mg chewable tablet Chew two tablets by mouth daily.   atorvastatin  (LIPITOR) 40 mg tablet TAKE 1 TABLET BY MOUTH EVERY DAY   budesonide  (ENTOCORT EC ) 3 mg capsule TAKE 2 CAPSULES BY MOUTH EVERY DAY   buPROPion  XL (WELLBUTRIN  XL) 300 mg tablet TAKE ONE TABLET BY MOUTH EVERY MORNING. DO NOT CRUSH OR CHEW.   chlorthalidone  (HYGROTON ) 25 mg tablet TAKE 1 TABLET BY MOUTH EVERY DAY  Patient not taking: Reported on 07/07/2023   DEXCOM G7 SENSOR sensor device Use one each as directed every 10 days. Indications: type 2 diabetes mellitus   duloxetine  DR (CYMBALTA ) 60 mg capsule TAKE 1 CAPSULE BY MOUTH EVERY DAY  Patient not taking: Reported on 07/07/2023   EPINEPHrine  (AUVI-Q ) 1 mg/mL injection pen (2-Pack) Inject 0.3 mg (1 Pen) into thigh if needed for anaphylactic reaction. May repeat in 5-15 minutes if needed.  Patient not taking: Reported on 07/07/2023   famotidine  (PEPCID ) 40 mg tablet Take one tablet by mouth twice daily.   glucagon  (BAQSIMI ) 3 mg/actuation nasal spray Use 1 spray in single nostril. If no response, may repeat in 15 minutes using second device.  Patient not taking: Reported on 07/07/2023   guaiFENesin  LA (MUCINEX ) 600 mg tablet Take one tablet by mouth twice daily as needed.  Patient not taking: Reported on 07/07/2023   hydrOXYzine  HCL (ATARAX ) 25 mg tablet Take one tablet by mouth three times daily as needed (allergies).  Patient not taking: Reported on 07/07/2023   insulin  aspart (U-100) (NOVOLOG  FLEXPEN U-100 INSULIN ) 100 unit/mL (3 mL) PEN Inject five Units to ten Units under the skin three times daily with meals.  Patient not taking: Reported on 07/07/2023   insulin  glargine (BASAGLAR  KWIKPEN U-100 INSULIN ) 100  unit/mL (3 mL) subcutaneous PEN Inject thirty six Units under the skin daily.   levalbuterol tartrate (XOPENEX HFA) 45 mcg/actuation inhaler Inhale two puffs by mouth into the lungs every 4-6 hours as needed for Wheezing or Shortness of Breath.  Patient not taking: Reported on 07/07/2023   losartan (COZAAR) 100 mg tablet TAKE 1 TABLET BY MOUTH EVERY DAY  Patient not taking: Reported on 07/07/2023   multivit-min/iron/FA/vit K/lut (CENTRUM SILVER WOMEN PO) Take 1 tablet by mouth daily.   nebivoloL (BYSTOLIC) 10 mg tablet TAKE 1 TABLET BY MOUTH EVERY DAY  Patient not taking: Reported on 07/07/2023   ondansetron (ZOFRAN ODT) 4 mg rapid dissolve tablet Dissolve one tablet by mouth every 8 hours as needed for Nausea or Vomiting. Place on tongue to dissolve.   pen needle, diabetic (BD NANO 2ND GEN PEN NEEDLE) 32 gauge x 5/32 pen needle Use with insulin injections four times daily  Indications: e11.22   polyethylene glycol 3350 (MIRALAX) 17 g packet Take one packet by mouth as Needed.  Patient not taking: Reported on 07/07/2023   pramoxine-hydrocortisone (ANALPRAM-HC) 1-1 % rectal cream Insert or Apply one g to rectal area as directed twice daily.  Patient taking differently: Insert or Apply one g to rectal area as directed twice daily as needed.   tirzepatide (MOUNJARO) 2.5 mg/0.5 mL injector PEN INJECT 0.5 ML UNDER THE SKIN EVERY 7 DAYS  Patient not taking: Reported on 07/07/2023   ustekinumab (STELARA) 90 mg syringe Inject 1 mL under the skin every 8 weeks.  Patient not taking: Reported on 07/07/2023   vonoprazan (VOQUEZNA) 10 mg tablet Take one tablet by mouth daily.     Adverse Drug Reactions  Adverse drug reactions were reviewed with the patient.    Significant adverse drug reaction(s) were not identified.    Side effect(s) were not reported.    The patient does not have injection related concerns.    The patient does not have infection related concerns.    Adherence  Refill and adherence history were reviewed with the patient. The patient was educated on the importance of adherence.    Patient is adherent with refills: not due for refill  Patient is meeting refill adherence goal: cannot be assessed    Patient reported 0 missed doses over the past 30 days.  Significance of missed doses: NA - no missed doses   Patient is meeting reported adherence goal.    Safety Precautions    Risk Evaluation and Mitigation (REMS) Assessment: REMS is not required for this medication.    Safety precautions were addressed and discussed with the patient as applicable.    Contraindications: Gerrie Castiglia does not have contraindications to this medication.    Pregnancy Status: Female, not of child-bearing potential, education not applicable.    Medication Education  The patient was counseled via telephone. 5 minutes were spent educating the patient.    Transition to [employer] Lisamarie Coke was given the opportunity to ask questions but did not have any questions at this time. Patient was reminded of the refill process and encouraged to call with questions. The monitoring and follow-up plan was discussed with the patient. The patient was instructed to contact their health care provider if their symptoms or health problems do not get better or if they become worse. For clinical questions about this medication, the pharmacist can be reached at [phone]. For questions about cost, insurance coverage, or to obtain refills, the patient should contact the pharmacy  via MyChart or by calling [phone]. The patient verbalized acceptance and understanding.    Follow-up Plan  The patient will be reassessed within 60 days.    The medication(s) will be shipped from [facility].    Aretta Beery, PHARMD

## 2023-08-05 ENCOUNTER — Encounter: Admit: 2023-08-05 | Discharge: 2023-08-05 | Payer: BLUE CROSS/BLUE SHIELD

## 2023-08-05 MED FILL — STELARA 90 MG/ML SC SYRG: 90 mg/mL | SUBCUTANEOUS | 56 days supply | Qty: 1 | Fill #1 | Status: AC

## 2023-08-18 ENCOUNTER — Encounter: Admit: 2023-08-18 | Discharge: 2023-08-18 | Payer: BLUE CROSS/BLUE SHIELD

## 2023-08-25 ENCOUNTER — Encounter: Admit: 2023-08-25 | Discharge: 2023-08-25 | Payer: BLUE CROSS/BLUE SHIELD

## 2023-08-25 ENCOUNTER — Ambulatory Visit: Admit: 2023-08-25 | Discharge: 2023-08-26 | Payer: BLUE CROSS/BLUE SHIELD

## 2023-08-29 ENCOUNTER — Ambulatory Visit: Admit: 2023-08-29 | Discharge: 2023-08-30 | Payer: BLUE CROSS/BLUE SHIELD

## 2023-08-29 ENCOUNTER — Encounter: Admit: 2023-08-29 | Discharge: 2023-08-29 | Payer: BLUE CROSS/BLUE SHIELD

## 2023-08-29 NOTE — Progress Notes
 Specialty Medication Injection Education    Medication name: STELARA SC    Clinic Pharmacist Injection Education  Patient presents today for teaching on their subcutaneous specialty medication.    Patient was educated on proper administration technique as well as storage/disposal. The patient demonstrated correct technique after instruction.    The patient successfully injected the medication into the thigh (right side). Next dose due on 10/24/2023.    The monitoring and follow-up plan was discussed with the patient. The patient was instructed to contact their health care provider if their symptoms or health problems do not get better or if they become worse. Tina Snyder was instructed to contact the specialty pharmacy at [phone] if they have any questions or concerns regarding their medication therapy.    Freddy Jain, PHARMD

## 2023-09-02 ENCOUNTER — Encounter: Admit: 2023-09-02 | Discharge: 2023-09-02 | Payer: BLUE CROSS/BLUE SHIELD

## 2023-09-03 ENCOUNTER — Encounter: Admit: 2023-09-03 | Discharge: 2023-09-03 | Payer: BLUE CROSS/BLUE SHIELD

## 2023-09-03 DIAGNOSIS — I10 Essential (primary) hypertension: Secondary | ICD-10-CM

## 2023-09-03 MED ORDER — AMLODIPINE 5 MG PO TAB
5 mg | ORAL_TABLET | Freq: Every day | ORAL | 1 refills | 90.00000 days | Status: AC
Start: 2023-09-03 — End: ?

## 2023-09-03 MED ORDER — NEBIVOLOL 10 MG PO TAB
10 mg | ORAL_TABLET | Freq: Every day | ORAL | 3 refills | 60.00000 days | Status: AC
Start: 2023-09-03 — End: ?

## 2023-09-03 MED ORDER — AMLODIPINE 5 MG PO TAB
5 mg | ORAL_TABLET | Freq: Every day | ORAL | 5 refills
Start: 2023-09-03 — End: ?

## 2023-09-03 NOTE — Telephone Encounter
 1. Received e-request from pharmacy requesting new Rx for   Requested Prescriptions     Pending Prescriptions Disp Refills    amLODIPine (NORVASC) 5 mg tablet [Pharmacy Med Name: AMLODIPINE BESYLATE 5 MG TAB] 30 tablet 5     Sig: TAKE 1 TABLET BY MOUTH EVERY DAY   .    2. Last Rx written: 05/21/2023  (#90 x1) by PCP    3. Last Gen Med Visit: in person  on 07/07/2023    4. Next Appt due: Provider recommended return date from last office visit: N/A    Future Appointments   Date Time Provider Department Center   09/05/2023  9:45 AM Elida Grounds, MD [facility] Surgery   10/16/2023 10:30 AM Eleanora Grew, MBBS [facility] IM   10/27/2023 10:00 AM Merleen Stare, MD [facility] Ophthalmolog   10/31/2023 11:30 AM Mariella Shore, APRN-NP [facility] IM   11/26/2023  2:00 PM Olan Bering, PA-C [facility] IM       5. All protocol criteria met? Yes, last office visit assessment and plan reviewed.     PER PT'S CHART IT SAYS THAT THIS MEDICATION IS PAUSED.    Courtesy Refills: N/A    Patient due for an office visit in 1,2, or 3 months 90 day supply, 0 refills   Patient due for an office visit in 4,5, or 6 months 90 day supply, 1 refill   Patient due for an office visit in 7,8, or 9 months 90 day supply, 2 refills   Patient due for an office visit in 10,11, or 12 months 90 day supply, 3 refills

## 2023-09-05 ENCOUNTER — Encounter: Admit: 2023-09-05 | Discharge: 2023-09-05 | Payer: BLUE CROSS/BLUE SHIELD

## 2023-09-05 ENCOUNTER — Ambulatory Visit: Admit: 2023-09-05 | Discharge: 2023-09-06 | Payer: BLUE CROSS/BLUE SHIELD

## 2023-09-06 ENCOUNTER — Encounter: Admit: 2023-09-06 | Discharge: 2023-09-06 | Payer: BLUE CROSS/BLUE SHIELD

## 2023-09-07 MED FILL — DEXCOM G7 SENSOR MISC DEVI: 30 days supply | Qty: 3 | Fill #1 | Status: AC

## 2023-09-18 ENCOUNTER — Encounter: Admit: 2023-09-18 | Discharge: 2023-09-18 | Payer: BLUE CROSS/BLUE SHIELD

## 2023-09-19 ENCOUNTER — Encounter: Admit: 2023-09-19 | Discharge: 2023-09-19 | Payer: BLUE CROSS/BLUE SHIELD

## 2023-09-19 NOTE — Progress Notes
 Specialty Medication Reassessment Attempt    Medication name: STELARA SC    Contacted Jacobi Ryant to verify compliance and assess tolerance of their specialty medication.    Left voicemail asking patient to return call to the pharmacist at [phone].    Freddy Jain, PHARMD

## 2023-09-22 ENCOUNTER — Encounter: Admit: 2023-09-22 | Discharge: 2023-09-22 | Payer: BLUE CROSS/BLUE SHIELD

## 2023-09-24 ENCOUNTER — Encounter: Admit: 2023-09-24 | Discharge: 2023-09-24 | Payer: BLUE CROSS/BLUE SHIELD

## 2023-09-25 MED FILL — STELARA 90 MG/ML SC SYRG: 90 mg/mL | SUBCUTANEOUS | 56 days supply | Qty: 1 | Fill #2 | Status: AC

## 2023-09-26 ENCOUNTER — Encounter: Admit: 2023-09-26 | Discharge: 2023-09-26 | Payer: BLUE CROSS/BLUE SHIELD

## 2023-09-27 ENCOUNTER — Encounter: Admit: 2023-09-27 | Discharge: 2023-09-27 | Payer: BLUE CROSS/BLUE SHIELD

## 2023-09-27 MED FILL — DEXCOM G7 SENSOR MISC DEVI: 30 days supply | Qty: 3 | Fill #2 | Status: AC

## 2023-10-02 ENCOUNTER — Encounter: Admit: 2023-10-02 | Discharge: 2023-10-02 | Payer: BLUE CROSS/BLUE SHIELD

## 2023-10-02 NOTE — Progress Notes
 Pharmacy Medication Reassessment    Indication/Regimen  The regimen of STELARA SC indefinitely is appropriate for Tina Snyder who has Crohn's disease (CMS-HCC).    Renal dose adjustments are not required. Hepatic dose adjustments are not required. Dose titration is required.    The following titration will be followed for Stelara: 390 mg IV induction dose, then 90 mg subcutaneously every 8 weeks starting 8 weeks after the IV induction dose. The patient received the IV induction dose on 07/04/2023.    The patient has the ability to self-administer the medication(s).    Baseline Characteristics  Disease severity: moderate-severe  Disease description: ileo-colonic  Additional clinical manifestations: fistulas  Medications being used concurrently: Stelara  Previously trialed agents: Remicade, humira, entyvio  Allergy and/or intolerance to IBD medications: sulfasalazine  Co-morbid conditions: NA  Additional considerations: NA  Baseline endoscopy: 06/10/23 : Scattered linear small aphtha in the distal ileum  Baseline pathology: 06/10/23: Superficial erosion with associated acute and chronic inflammation.    Therapeutic Goals and Monitoring  The goal of therapy is to control symptoms and achieve remission.    Disease activity (patient reported): often active  Patient reported improvement: 0-24 percent improvement in symptoms since starting therapy  Clinical symptoms: abdominal pain, constipation and diarrhea  Bowel movements per day: 1-3  Steroid course in past 90 days: no  Flare (past 90 days): no  Patient is not currently in clinical remission. Patient reports no changes in GI symptoms, describes upper abdominal pain due to hiatial hernia, but also abdominal pain on left side and stools fluctuating from 4-7/day and days of constipation    Endoscopic Evaluation  Endoscopic procedure completed: no (no updated endoscopic procedure)    Histologic Evaluation  Test completed: no (no updated biopsy)    Evaluation  The patient is not making progress toward achieving their therapeutic goal, due to the following reasons: too soon to assess. Discussed may take up to 6+ months for full affects The plan is to continue current therapy. Patient has appt in July with NP, will continue to follow    Past Medical History and Comorbidities  Patient Active Problem List   Diagnosis    Crohn's disease (CMS-HCC)    Hypertension    Depression    Anemia    Seizure disorder (CMS-HCC)    Mycobacterium avium-intracellulare complex (CMS-HCC)    Long term (current) use of insulin (CMS-HCC)    Enteroenteric fistula    CKD (chronic kidney disease) stage 3, GFR 30-59 ml/min (CMS-HCC)    SOB (shortness of breath)    Lymphedema of both arms    Pure hypercholesterolemia    OSA (obstructive sleep apnea)    Postthrombotic syndrome    Type 2 diabetes mellitus without complication, with long-term current use of insulin (CMS-HCC)    Closed fracture of right foot/leg wearing brace; with delayed healing 18Dec.2018, at noon dizzy, fell back wards with leg back under her; fractured R foot/leg; following with orthopedics    Bronchial atresia    Essential (hemorrhagic) thrombocythemia (CMS-HCC)    Lung nodule    Chronic respiratory failure with hypoxia (CMS-HCC)    Allergic contact dermatitis due to plants, except food    Multiple food allergies    Recurrent infections    Chronic rhinitis    Hypogammaglobulinemia    Prurigo nodularis    Major depressive disorder, recurrent, moderate (CMS-HCC)    Obesity (BMI 30-39.9)    Primary insomnia    Abnormal finding on lung imaging  Bronchiectasis (CMS-HCC)    Chronic pancreatitis, unspecified pancreatitis type (CMS-HCC)    Intractable nausea and vomiting    Gastroesophageal reflux disease     Additional comorbidities: no    Labs and Diagnostic Tests  Lab Results   Component Value Date/Time    WBC 5.40 07/03/2023 05:22 AM    RBC 3.46 (L) 07/03/2023 05:22 AM    HGB 10.3 (L) 07/03/2023 05:22 AM    HCT 31.0 (L) 07/03/2023 05:22 AM MCV 89.6 07/03/2023 05:22 AM    MCH 29.9 07/03/2023 05:22 AM    MCHC 33.3 07/03/2023 05:22 AM    RDW 13.8 07/03/2023 05:22 AM    PLTCT 226 07/03/2023 05:22 AM    MPV 8.7 07/03/2023 05:22 AM       Lab Results   Component Value Date/Time    NEUT 73 05/09/2023 02:22 PM    ANC 6.70 05/09/2023 02:22 PM    LYMA 19 (L) 05/09/2023 02:22 PM    ALC 1.80 05/09/2023 02:22 PM    MONA 6 05/09/2023 02:22 PM    AMC 0.50 05/09/2023 02:22 PM    EOSA 1 05/09/2023 02:22 PM    AEC 0.10 05/09/2023 02:22 PM    BASA 0 05/09/2023 02:22 PM    ABC 0.00 05/09/2023 02:22 PM       Lab Results   Component Value Date/Time    NA 142 07/03/2023 05:22 AM    K 3.7 07/03/2023 05:22 AM    CL 108 07/03/2023 05:22 AM    CO2 24 07/03/2023 05:22 AM    GAP 10 07/03/2023 05:22 AM    BUN 19 07/03/2023 05:22 AM    CR 2.08 (H) 07/03/2023 05:22 AM    GLU 72 07/03/2023 05:22 AM       Lab Results   Component Value Date/Time    CA 8.7 07/03/2023 05:22 AM    PO4 3.4 07/03/2023 05:22 AM    ALBUMIN 3.4 (L) 07/03/2023 05:22 AM    TOTPROT 5.5 (L) 07/03/2023 05:22 AM    ALKPHOS 103 07/03/2023 05:22 AM    AST 15 07/03/2023 05:22 AM    ALT 10 07/03/2023 05:22 AM    TOTBILI 0.4 07/03/2023 05:22 AM    GFR 27 (L) 07/03/2023 05:22 AM    GFRAA 52 (L) 03/23/2020 01:04 PM       Lab Results   Component Value Date/Time    HEPBSAG Non-Reactive: HBs antigen not detected 01/29/2019 09:10 AM    HEPBSAB  01/29/2019 09:16 AM     Negative: Individual is considered to be non-immune to HBV infection.    HEPBCTOTAL  01/29/2019 09:16 AM     Non-Reactive: Antibodies to HBV core antigen (anti-HBc)were not detected.       Lab Results   Component Value Date/Time    QUANTIFERTB SEE CHARTMAXX FOR COMPLETE REPORT. 09/11/2007 08:00 AM         Allergies  Allergies   Allergen Reactions    Bee Venom Protein (Honey Bee) ANAPHYLAXIS    Ciprofloxacin VOMITING    Enoxaparin SEE COMMENTS and UNKNOWN     Hemorrhage    Morphine HALLUCINATIONS and ANAPHYLAXIS     States she quits breathing    Warfarin SEE COMMENTS and UNKNOWN     Hemorrhage    Metoclopramide SEE COMMENTS and RASH     Patient states that she cannot tolerate.    Metronidazole HIVES    Bee [Bumble Bee] UNKNOWN    Ciprofloxacin NAUSEA AND VOMITING    Stadol [Butorphanol  Tartrate] SEE COMMENTS     Patient states that it made her loopy and drunk.    Tysabri [Natalizumab] SEE COMMENTS     JC virus positive.  Do not use Tysabri - high risk for PML    Walnut UNKNOWN    Zyrtec [Cetirizine] UNKNOWN    Aspartame UNKNOWN    Butorphanol UNKNOWN    Farxiga [Dapagliflozin] SEE COMMENTS     Recurrent yeast infections    Pecan Nut UNKNOWN    Prochlorperazine HIVES, SEE COMMENTS and UNKNOWN     Tolerates Phenergan    Sulfa (Sulfonamide Antibiotics) HIVES, NAUSEA AND VOMITING and UNKNOWN    Sulfasalazine UNKNOWN    Topiramate UNKNOWN        Immunizations  Vaccine history was reviewed with the patient. Education was provided on the importance of completing vaccines. The patient should avoid live vaccines.    Immunization History   Administered Date(s) Administered    COVID-19 ([employer]JANSSEN) recombinant protein vacc, 0.5 mL (PF) 07/22/2019, 07/22/2019    COVID-19 (MODERNA), mRNA vacc, 100 mcg/0.5 mL (PF) 03/23/2020, 07/20/2020    COVID-19 Bivalent (51YR+)(PFIZER), mRNA vacc, 71mcg/0.3mL 10/11/2021    Covid-19 mRNA Vaccine >=12yo (Moderna)(Spikevax) 06/12/2022, 02/20/2023    FLU VACCINE >3YO 01/10/2011, 01/06/2012    FLU VACCINE >3YO (Preservative Free) 03/04/2011    Flu vaccine, inj unspecified (Historical) 05/17/2019, 02/06/2022    Pneumococcal Vaccine (20-Val) 08/28/2022    Pneumococcal Vaccine (23-Val Adult) 02/26/2011, 09/19/2020    Tdap Vaccine 09/30/2017    Zoster Vaccine Recombinant, Adjuvanted (shingles) IM (vial 2 of 2)(SHINGRIX) 10/11/2021, 02/06/2022    Zoster Vaccine Recombinant, adjuvant suspension component (vial 1 of 2)(SHINGRIX) 10/11/2021, 02/06/2022       Medication Reconciliation  Medication history and reconciliation were performed (including prescription medications, supplements, over the counter, and herbal products). The medication list was updated and the patient's current medication list is included. The patient was instructed to speak with their health care provider before starting any new drug, including prescription or over the counter, natural / herbal products, or vitamins.    Drug Interactions    Drug-Drug Interactions  Drug-drug interactions were evaluated. There were not clinically significant drug-drug interactions.     Drug-Food Interactions  Drug-food interactions were not evaluated (NA - not oral).    Home Medications    Medication Sig   amLODIPine (NORVASC) 5 mg tablet Take one tablet by mouth daily.   ascorbic acid (VITAMIN C) 500 mg chewable tablet Chew two tablets by mouth daily.   atorvastatin (LIPITOR) 40 mg tablet TAKE 1 TABLET BY MOUTH EVERY DAY   budesonide (ENTOCORT EC) 3 mg capsule TAKE 2 CAPSULES BY MOUTH EVERY DAY   buPROPion XL (WELLBUTRIN XL) 300 mg tablet TAKE ONE TABLET BY MOUTH EVERY MORNING. DO NOT CRUSH OR CHEW.   chlorthalidone (HYGROTON) 25 mg tablet TAKE 1 TABLET BY MOUTH EVERY DAY   DEXCOM G7 SENSOR sensor device Use one each as directed every 10 days. Indications: type 2 diabetes mellitus   duloxetine DR (CYMBALTA) 60 mg capsule TAKE 1 CAPSULE BY MOUTH EVERY DAY   EPINEPHrine (AUVI-Q) 1 mg/mL injection pen (2-Pack) Inject 0.3 mg (1 Pen) into thigh if needed for anaphylactic reaction. May repeat in 5-15 minutes if needed.   famotidine (PEPCID) 40 mg tablet Take one tablet by mouth twice daily.   glucagon (BAQSIMI) 3 mg/actuation nasal spray Use 1 spray in single nostril. If no response, may repeat in 15 minutes using second device.   guaiFENesin LA (MUCINEX)  600 mg tablet Take one tablet by mouth twice daily as needed.   hydrOXYzine HCL (ATARAX) 25 mg tablet Take one tablet by mouth three times daily as needed (allergies).   insulin aspart (U-100) (NOVOLOG FLEXPEN U-100 INSULIN) 100 unit/mL (3 mL) PEN Inject five Units to ten Units under the skin three times daily with meals.   insulin glargine (BASAGLAR KWIKPEN U-100 INSULIN) 100 unit/mL (3 mL) subcutaneous PEN Inject thirty six Units under the skin daily.  Patient taking differently: Inject twenty eight Units under the skin daily.   levalbuterol tartrate (XOPENEX HFA) 45 mcg/actuation inhaler Inhale two puffs by mouth into the lungs every 4-6 hours as needed for Wheezing or Shortness of Breath.   losartan (COZAAR) 100 mg tablet TAKE 1 TABLET BY MOUTH EVERY DAY   multivit-min/iron/FA/vit K/lut (CENTRUM SILVER WOMEN PO) Take 1 tablet by mouth daily.   nebivoloL (BYSTOLIC) 10 mg tablet Take one tablet by mouth daily.   ondansetron (ZOFRAN ODT) 4 mg rapid dissolve tablet Dissolve one tablet by mouth every 8 hours as needed for Nausea or Vomiting. Place on tongue to dissolve.   pen needle, diabetic (BD NANO 2ND GEN PEN NEEDLE) 32 gauge x 5/32 pen needle Use with insulin injections four times daily  Indications: e11.22   pramoxine-hydrocortisone (ANALPRAM-HC) 1-1 % rectal cream Insert or Apply one g to rectal area as directed twice daily.  Patient taking differently: Insert or Apply one g to rectal area as directed twice daily as needed.   tirzepatide (MOUNJARO) 2.5 mg/0.5 mL injector PEN Inject 0.5 mL under the skin every 7 days.   ustekinumab (STELARA) 90 mg syringe Inject 1 mL under the skin every 8 weeks.   vonoprazan (VOQUEZNA) 10 mg tablet Take one tablet by mouth daily.     Adverse Drug Reactions  Adverse drug reactions were reviewed with the patient.    Significant adverse drug reaction(s) were not identified.    Side effect(s) were not reported.    The patient does not have injection related concerns.    Adherence  Refill and adherence history were reviewed with the patient. The patient was educated on the importance of adherence.    Patient is adherent with refills: yes  Patient is meeting refill adherence goal: yes    Patient reported 0 missed doses over the past 30 days.  Significance of missed doses: NA - no missed doses   Patient is meeting reported adherence goal.    Safety Precautions    Risk Evaluation and Mitigation (REMS) Assessment: REMS is not required for this medication.    Safety precautions were addressed and discussed with the patient as applicable.    Contraindications: Tina Snyder does not have contraindications to this medication.    Pregnancy Status: Female, not of child-bearing potential, education not applicable.    Medication Education  Counseling was not completed because patient was previously educated and did not require additional counseling.    Tina Snyder was given the opportunity to ask questions but did not have any questions at this time. Patient was reminded of the refill process and encouraged to call with questions. The monitoring and follow-up plan was discussed with the patient. The patient was instructed to contact their health care provider if their symptoms or health problems do not get better or if they become worse. For clinical questions about this medication, the pharmacist can be reached at [phone]. For questions about cost, insurance coverage, or to obtain refills, the patient should contact the  pharmacy via MyChart or by calling [phone]. The patient verbalized acceptance and understanding.    Follow-up Plan  The patient will be reassessed within 90 days.    The medication(s) will be shipped from [facility].    Freddy Jain, PHARMD

## 2023-10-14 ENCOUNTER — Encounter: Admit: 2023-10-14 | Discharge: 2023-10-14 | Payer: BLUE CROSS/BLUE SHIELD

## 2023-10-14 DIAGNOSIS — K50018 Crohn's disease of small intestine with other complication: Secondary | ICD-10-CM

## 2023-10-14 MED ORDER — BUDESONIDE 3 MG PO CECX
6 mg | ORAL_CAPSULE | Freq: Every day | ORAL | 5 refills | 30.00000 days | Status: AC
Start: 2023-10-14 — End: ?

## 2023-10-14 NOTE — Telephone Encounter
 Refill request received for:    budesonide  (ENTOCORT EC ) 3 mg capsule     Sig: TAKE 2 CAPSULES BY MOUTH EVERY DAY    Disp: 60 capsule    Refills: 5     Last OV: 05/20/23      Routing to Dr. Enid for approval/refusal

## 2023-10-16 ENCOUNTER — Encounter: Admit: 2023-10-16 | Discharge: 2023-10-16 | Payer: BLUE CROSS/BLUE SHIELD

## 2023-10-16 ENCOUNTER — Ambulatory Visit: Admit: 2023-10-16 | Discharge: 2023-10-16 | Payer: BLUE CROSS/BLUE SHIELD

## 2023-10-19 ENCOUNTER — Encounter: Admit: 2023-10-19 | Discharge: 2023-10-19 | Payer: BLUE CROSS/BLUE SHIELD

## 2023-10-20 ENCOUNTER — Encounter: Admit: 2023-10-20 | Discharge: 2023-10-20 | Payer: BLUE CROSS/BLUE SHIELD

## 2023-10-20 MED FILL — DEXCOM G7 SENSOR MISC DEVI: 30 days supply | Qty: 3 | Fill #2 | Status: AC

## 2023-10-27 ENCOUNTER — Encounter: Admit: 2023-10-27 | Discharge: 2023-10-27 | Payer: BLUE CROSS/BLUE SHIELD

## 2023-10-27 ENCOUNTER — Ambulatory Visit: Admit: 2023-10-27 | Discharge: 2023-10-28 | Payer: BLUE CROSS/BLUE SHIELD

## 2023-10-27 DIAGNOSIS — E119 Type 2 diabetes mellitus without complications: Principal | ICD-10-CM

## 2023-10-27 NOTE — Progress Notes
 Assessment and Plan:    Problem   Type 2 Diabetes Mellitus Without Complication, With Long-Term Current Use of Insulin  (Cms-Hcc)       Type 2 diabetes mellitus without complication, with long-term current use of insulin  (CMS-HCC)  Does the patient have Diabetes? Yes,  and the patient has NO evidence of retinopathy and/or macular edema today.    The best way to protect your eyes is to keep your blood sugar and blood pressure under good control. Recommend yearly dilated exams.    Call immediately if any changes in vision occur.    New glasses prescription given  Pt liked plano for distance             Return in about 1 year (around 10/26/2024) for Dilated exam, MRx - see optom.    Arlean Glassman, MD  [facility]      HPI:  Pt referred by her PCP, Dr Carlean, for DM eye exam.  Pt reports her distance vision has never been clear with her current glasses.  She has returned to the optical shop several times for adjustments but vision has never improved.  Vision improves a little with out them on in the distance.  Also reports headaches on alternate sides over her eyes off and on for several months.  Her eyes feel tired at days end.  Has dryness in nasal corners.  Has occasional floaters but no flashes. Last HgA1c=5.9      FYI CE OU in 2019 Yag Cap 2021, OD     Last had a eye exam the 1st of Nov. With her optometrist. Feels like since this appt her vision has gotten worse. Feels like distance vision is worse than up close. Does wear progressive lens and feels like distance vision is a bit better with them off. Does not feel eye dryness or irritation, but does feel like her eyes are fatigued at the end of the day and has headache.         Exam:  Base Eye Exam       Visual Acuity (Snellen - Linear)         Right Left Both    Dist cc 20/25 +1 20/20     Near cc   J2      Correction: Glasses              Tonometry (Tonopen, 10:31 AM)         Right Left    Pressure 15 12              Pupils         Dark Light Shape React APD    Right 5 2 Round Brisk None    Left 5 2 Round Brisk None              Visual Fields         Left Right     Full Full              Extraocular Movement         Right Left     Full Full              Neuro/Psych       Oriented x3: Yes              Dilation       Both eyes: 1.0% Tropicamide, 2.5% Phenylephrine @ 10:32 AM  Slit Lamp and Fundus Exam       Slit Lamp Exam         Right Left    Lids/Lashes Normal Normal    Conjunctiva/Sclera White and quiet White and quiet    Cornea Minor PEE nasally Clear    Anterior Chamber Deep and quiet Deep and quiet    Iris Flat Flat    Lens PCIOL PCIOL, non central 1+ PCO              Fundus Exam         Right Left    Vitreous Normal     Disc Sharp healthy rim Sharp healthy rim    C/D Ratio .3 .3    Macula Flat Flat    Vessels Normal caliber and number Normal caliber and number    Periphery Attached no breaks or tears Attached no breaks or tears                  Refraction       Wearing Rx         Sphere Cylinder Axis Add    Right +0.25 +0.50 175 +2.50    Left [place] +0.50 010 +2.50              Manifest Refraction         Sphere Cylinder Axis Dist VA Add    Right +0.25 +0.75 010 20/20 +2.50    Left +0.25 +0.50 015 20/20+1 +2.50              Final Rx         Sphere Add    Right [place] +2.50    Left [place] +2.50      Expiration Date: 10/26/2024    Comments: Bifocal- Progressive

## 2023-10-27 NOTE — Assessment & Plan Note
 Does the patient have Diabetes? Yes,  and the patient has NO evidence of retinopathy and/or macular edema today.    The best way to protect your eyes is to keep your blood sugar and blood pressure under good control. Recommend yearly dilated exams.    Call immediately if any changes in vision occur.

## 2023-10-31 ENCOUNTER — Encounter: Admit: 2023-10-31 | Discharge: 2023-10-31 | Payer: BLUE CROSS/BLUE SHIELD

## 2023-10-31 ENCOUNTER — Ambulatory Visit: Admit: 2023-10-31 | Discharge: 2023-11-01 | Payer: BLUE CROSS/BLUE SHIELD

## 2023-10-31 DIAGNOSIS — K50018 Crohn's disease of small intestine with other complication: Principal | ICD-10-CM

## 2023-10-31 DIAGNOSIS — I10 Essential (primary) hypertension: Secondary | ICD-10-CM

## 2023-10-31 DIAGNOSIS — E538 Deficiency of other specified B group vitamins: Secondary | ICD-10-CM

## 2023-10-31 DIAGNOSIS — Z79899 Other long term (current) drug therapy: Secondary | ICD-10-CM

## 2023-10-31 DIAGNOSIS — K861 Other chronic pancreatitis: Secondary | ICD-10-CM

## 2023-10-31 DIAGNOSIS — Z9049 Acquired absence of other specified parts of digestive tract: Secondary | ICD-10-CM

## 2023-10-31 DIAGNOSIS — R6881 Early satiety: Secondary | ICD-10-CM

## 2023-10-31 DIAGNOSIS — D849 Immunodeficiency, unspecified: Secondary | ICD-10-CM

## 2023-10-31 DIAGNOSIS — N183 Stage 3 chronic kidney disease, unspecified whether stage 3a or 3b CKD (CMS-HCC): Secondary | ICD-10-CM

## 2023-10-31 DIAGNOSIS — E119 Type 2 diabetes mellitus without complications: Secondary | ICD-10-CM

## 2023-10-31 DIAGNOSIS — K5900 Constipation, unspecified: Secondary | ICD-10-CM

## 2023-10-31 DIAGNOSIS — K219 Gastro-esophageal reflux disease without esophagitis: Secondary | ICD-10-CM

## 2023-10-31 DIAGNOSIS — R1010 Upper abdominal pain, unspecified: Secondary | ICD-10-CM

## 2023-10-31 MED ORDER — CYANOCOBALAMIN (VITAMIN B-12) 1,000 MCG/ML IJ SOLN
INTRAMUSCULAR | 0 refills | 29.00000 days | Status: AC
Start: 2023-10-31 — End: ?

## 2023-10-31 NOTE — Patient Instructions
 If you have questions or concerns that need to be addressed before your next scheduled office visit, please send a MyChart patient message, or call my nurse voicemail at [phone]. You will be prompted to leave a voicemail, as this phone is automated and checked throughout the day.    VISIT SUMMARY:  Today, we discussed your ongoing health issues, including Crohn's disease, suspected gastroparesis, GERD, stage 4 chronic kidney disease, diabetes, hypertension, and your history of cataracts. We reviewed your current symptoms, medications, and recent test results to adjust your treatment plan accordingly.    YOUR PLAN:  -ILEOCOLONIC CROHN'S DISEASE: Crohn's disease is a chronic inflammatory condition of the intestines. We will continue your Stelara  treatment and monitor your response. A fecal calprotectin stool test will be ordered to assess current intestinal inflammation. We may adjust the frequency of your Stelara  doses based on your symptoms and drug levels. Additionally, a gastric emptying study will be ordered to check for gastroparesis, which could be contributing to your symptoms.    -GASTROPARESIS: Gastroparesis is a condition where the stomach empties slowly, causing symptoms like early satiety, nausea, and vomiting. We will order a gastric emptying study to confirm this diagnosis. In the meantime, follow a gastroparesis diet that focuses on low fiber intake and cooked vegetables. If gastroparesis is confirmed, we may consider medications like metoclopramide to help with stomach motility.    -GASTROESOPHAGEAL REFLUX DISEASE (GERD): GERD is a condition where stomach acid frequently flows back into the esophagus, causing heartburn and other symptoms. You will continue taking vonoprazan as prescribed. If your symptoms persist, we may consider additional tests like an esophagram, Bravo study, and esophageal manometry.    -STAGE 4 CHRONIC KIDNEY DISEASE: Stage 4 chronic kidney disease is a severe reduction in kidney function. We will review your nephrology notes and the results of your upcoming renal scan. To prevent further kidney damage, avoid NSAIDs and contrast studies, and continue following a low salt diet as advised by nephrology.    -DIABETES MELLITUS WITH COMPLICATIONS: Diabetes is a condition where your blood sugar levels are too high. Your A1c has improved to 5.3 with your current management. Continue taking Mounjaro  with an adjusted dosing schedule to manage your blood glucose levels. Monitor for symptoms of low blood sugar and adjust your insulin  as needed.    -HYPERTENSION: Hypertension is high blood pressure. Continue taking Bystolic  and amlodipine  to manage your blood pressure, and monitor it regularly.    -HISTORY OF CATARACTS: Cataracts are a clouding of the eye's lens, which can affect vision. You have had surgery for this in the past, but you are still experiencing vision changes. We will continue to monitor your vision.    -GENERAL HEALTH MAINTENANCE: Your B12 levels have been fluctuating. We recommend resuming B12 injections to maintain adequate levels. If you prefer, you can consider sublingual B12 supplements instead.    INSTRUCTIONS:  Please follow up with the recommended tests, including the fecal calprotectin stool test and the gastric emptying study. Continue with your current medications and dietary recommendations. Monitor your blood pressure and blood sugar levels regularly. Avoid NSAIDs and contrast studies to protect your kidney function. Resume B12 injections or consider sublingual supplements. Follow up with nephrology after your renal scan at the end of the month.  =============================================================================    Hi Tina Snyder!    AFTER VISIT SUMMARY  We strive to be on time, and apologize if you had a wait today. It is always our goal to provide appropriate, prompt and  efficient care. Thank you for your patience today.     We ask that you please use MyChart to contact your healthcare team as this is our preferred method of communication. Call help desk for MyChart assistance at [phone].    To have a medication refilled:  Please use the MyChart Refill request or contact your pharmacy directly to request medication refills.  Please allow 72 hours.   ===================================================================================================================  PLEASE COMPLETE THE FOLLOWING REQUESTS:    PLAN:     Labs have been ordered. Please obtain at you earliest convenience. No appointment is needed, please check hours below.     Gastric Emptying Test orders are in place for you. Prep instructions below.  This test will show how fast the food empties from your stomach into your intestines or small bowel.  Please call radiology scheduling at [phone] to arrange this.      Medication has been ordered at your preferred pharmacy.    FOLLOW UP:    Return to clinic on ~ 01/31/2024 with Nurse Practioner Tina Snyder. Appointment may be telehealth.          Warmest,  [name] RN   ====================================================================================================================  If you have any questions or concerns regarding your medications, please contact the IBD Pharmacists at [phone] or e-mail [email or web address].  To schedule or reschedule a GI Clinic appointment, please call the GI Scheduling line at [phone], option 1.   To schedule or reschedule an Endoscopy procedure, such as an EGD or Colonoscopy, please call Endoscopy Scheduling at [phone].   To schedule or reschedule Imaging, such as a CT or MRI, please call Radiology Scheduling at [phone].   Our office Fax number, it is [phone].   =================================================================================================================================  As part of your care team, we have a Licensed Specialist Clinical Social Worker available who can assist in your care. She can help in managing the ups and downs of a chronic illness, providing emotional support resources, helping you to understand your diagnosis, and can also help get you in contact with fianancial/legal resources. Alan can be reached at [phone] or by email at [email or web address] - Please reach out to her with any questions or concerns you may have.   If you need to contact our social worker:  Alan Carson, LSCSW  [email or web address]  [phone]  =====================================================================================================================  [place] Addresses:  [facility]: [address] [place], [place] [address]  [facility]: [address] [place], [place] [address] (Floor 4 pod E)  [facility]: [address] [place], [place] [address] (Floor 2 alain [facility]: [address] [place] [address]  [facility] [address] [place], [place] [address]  [facility]: [address] [place], [place] [address]  =====================================================================================================================  [place] [facility] [facility] [employer]  -Medical Office Building Lab: 1st floor. It is open from 7 am-6 pm. Tuesday-Friday, 6:30am-7pm on Mondays, and 7 am - Noon on Saturdays   -[facility]: 2nd floor and is open 8 am-5 pm Monday-Friday  -[place] location lab is located next to the check out desk and is open from 8 AM to 4:45 PM Monday through Friday.   -Radiology: 2nd floor of the Medical Office Building and the 2nd Floor of [facility]. Radiology Scheduling can be reached at [phone]  -To Schedule office visits:  Call [phone] Option 1.    -For procedure scheduling questions at the [facility] please call [phone], option 2;  for a procedure at [facility] please call [phone]; for a procedure at [facility] please call [phone], option 1.  ======================================================================================================================  General Instructions:  As part of the CARES act, starting July 22, 2019 some results are released to you automatically. Your provider will continue to send you a detailed result note on any labs that they order, but with these changes you may see your results before they do. Critical lab results will be addressed immediately, but otherwise please give your provider 72 hours (3 business days) to view and respond to your results before reaching out with any questions. Depending on your questions, they may ask you to schedule a telehealth or telephone visit to discuss further. This visit may be billed to your insurance depending on time and complexity  -To receive appointment reminders on your cell phone: Communication preferences can be managed in MyChart to ensure you receive important appointment notifications  -Support for many chronic illnesses is available through [facility]: [email or web address] or [phone].  -For urgent questions on nights, weekends or holidays, call the Operator at [phone], and ask for the doctor on call for Gastroenterology. Call 911 for any emergencies.   =======================================================================================================================        Gastric Emptying Test    What is it?  A gastric emptying scan is a test that looks at how fast the food empties from your stomach into your intestines or bowel.    How it's done:  The gastric emptying scan is done in Nuclear Medicine. When you come for your test, you will eat some scrambled eggs made from an egg substitute like Egg Beaters that contain a small amount of radioactivity. A camera-like machine will take pictures of your stomach over 4 hours. The information from the test will tell your doctor if there is a problem with food leaving your stomach.    After procedure:  There are no restrictions after this test. You may return to your normal diet, medicine(s) and activities.    Prep:  Nothing to eat or drink 6 hours prior to exam  No smoking  Certain medicine can change the results of your test. If you are taking any of the following medicines, you should STOP taking them for 5 days before this test, or as directed by your doctor:  Reglan  Erythromycin   Zithromax  Valium   Zofran   Medicines for pain including: Percodan , Percocet, Darvocet, vicodin, lortab, oxycontin , morphine, or roxanol    Please check in at 2nd floor [facility].  If you need to reschedule, please call Radiology at [phone] and select option for Nuclear Medicine.     ---------------------------------------------------------------------------------------------------------------------------------------                 ==================================================================================  [facility] Diet Recommendations    During a Flare-Up:    Low-Residue Diet:  This diet restricts foods that are high in fiber and can be difficult to digest, such as raw fruits and vegetables, nuts, seeds, and whole grains.     Soft, Bland Foods:  Focus on easily digestible foods like cooked vegetables (without skin), low-fiber fruits (like bananas or melon), refined grains (white rice, pasta), and lean proteins (chicken, fish, tofu).     Hydration:  Adequate fluid intake is crucial to prevent dehydration, especially if experiencing diarrhea.     Small, Frequent Meals:  Eating smaller meals more often can help manage symptoms and ensure adequate nutrient intake.     During Remission:  Balanced Diet: Incorporate a variety of nutrient-rich foods, including fruits, vegetables, lean  protein, and whole grains.     Fruits and Vegetables: Choose a variety of fruits and vegetables, but consider adjusting the texture (cooking, pureeing) to improve tolerance.     Lean Protein: Include sources like fish, chicken, [place], lean pork, eggs, and tofu.     Omega-3 Fatty Acids: Foods rich in omega-3s, like fatty fish, can help reduce inflammation.     Consider a Mediterranean Diet: This diet, rich in fruits, vegetables, fish, and healthy fats, may be beneficial.     Probiotics: Foods like yogurt, kefir, and sauerkraut may help support a healthy gut microbiome.     Limit Trigger Foods: Pay attention to your body and identify foods that consistently trigger symptoms. Common triggers include spicy foods, fatty foods, and dairy (especially during flare-ups).    -=======================================================================      Cyanocobalamin  Injection  Brand Name(s): Berubigen ?, Betalin 12 ?, Cobavite ?, Redisol ?, Rubivite ?, Ruvite ?, Vi-twel ?, Vibisone ?; also available generically  Vitamin B12  WHY is this medicine prescribed?  Cyanocobalamin  injection is used to treat and prevent a lack of vitamin B12 that may be caused by any of the following: pernicious anemia (lack of a natural substance needed to absorb vitamin B12 from the intestine); certain diseases, infections, or medications that decrease the amount of vitamin B12 absorbed from food; or a vegan diet (strict vegetarian diet that does not allow any animal products, including dairy products and eggs). Lack of vitamin B12 may cause anemia (condition in which the red blood cells do not bring enough oxygen to the organs) and permanent damage to the nerves. Cyanocobalamin  injection also may be given as a test to see how well the body can absorb vitamin B12. Cyanocobalamin  injection is in a class of medications called vitamins. Because it is injected straight into the bloodstream, it can be used to supply vitamin B12 to people who cannot absorb this vitamin through the intestine.  HOW should this medicine be used?  Cyanocobalamin  comes as a solution (liquid) to be injected into a muscle or just under the skin. It is usually injected by a healthcare provider in an office or clinic. You will probably receive cyanocobalamin  injection once a day for the first 6-7 days of your treatment. As your red blood cells return to normal, you will probably receive the medication every other day for 2 weeks, and then every 3-4 days for 2-3 weeks. After your anemia has been treated, you will probably receive the medication once a month to prevent your symptoms from coming back.  Cyanocobalamin  injection will supply you with enough vitamin B12 only as long as you receive injections regularly. You may receive cyanocobalamin  injections every month for the rest of your life. Keep all appointments to receive cyanocobalamin  injections even if you feel well. If you stop receiving cyanocobalamin  injections, your anemia may return and your nerves may be damaged.  Are there OTHER USES for this medicine?  Cyanocobalamin  injection is also sometimes used to treat inherited conditions that decrease the absorption of vitamin B12 from the intestine. Cyanocobalamin  injection is also sometimes used to treat methylmalonic aciduria (an inherited disease in which the body cannot break down protein) and is sometimes given to unborn babies to prevent methylmalonic aciduria after birth. Talk to your doctor about the possible risks of using this drug for your condition.  This medication may be prescribed for other uses; ask your doctor or pharmacist for more information.  What SPECIAL PRECAUTIONS should I follow?  Before using cyanocobalamin  injection,  tell your doctor and pharmacist  if you are allergic to cyanocobalamin  injection, nasal gel, or tablets; hydroxocobalamin; multi-vitamins; any other medications or vitamins; or cobalt.  tell your doctor and pharmacist what prescription and nonprescription medications, vitamins, nutritional supplements, and herbal products you are taking or plan to take while using cyanocobalamin  injection. Your doctor may need to change the doses of your medications or monitor you carefully for side effects.  tell your doctor if you drink or have ever drunk large amounts of alcohol and if you have or have ever had Leber's hereditary optic neuropathy (slow, painless loss of vision, first in one eye and then in the other) or kidney disease.  tell your doctor if you are pregnant, plan to become pregnant, or are breast-feeding. If you become pregnant while using cyanocobalamin  injection, call your doctor. Talk to your doctor about the amount of vitamin B12 you should get every day when you are pregnant or breast-feeding.  What SPECIAL DIETARY instructions should I follow?  Unless your doctor tells you otherwise, continue your normal diet.  What should I do IF I FORGET to take a dose?  If you miss an appointment to receive a cyanocobalamin  injection, call your doctor as soon as possible.  What SIDE EFFECTS can this medicine cause?  Some side effects can be serious. The following symptoms are uncommon, but if you experience any of them, call your doctor immediately:   muscle weakness, cramps, or pain  leg pain  extreme thirst  frequent urination  confusion  shortness of breath, especially when you exercise or lie down  coughing or wheezing  fast heartbeat  extreme tiredness  swelling of the arms, hands, feet, ankles or lower legs  pain, warmth, redness, swelling or tenderness in one leg  headache  dizziness  red skin color, especially on the face  hives  rash  itching  difficulty breathing or swallowing  Cyanocobalamin  injection may cause other side effects. Call your doctor if you have any unusual problems while taking this medication.  If you experience a serious side effect, you or your doctor may send a report to the Food and Drug Administration's (FDA) MedWatch Adverse Event Reporting program online (https://peters-thompson.com/) or by phone (4174942176).  What should I know about STORAGE and DISPOSAL of this medication?  Your doctor will store this medication in his or her office.  What should I do in case of OVERDOSE?  In case of overdose, call the poison control helpline at [phone]. Information is also available online at DonorPros.fi. If the victim has collapsed, had a seizure, has trouble breathing, or can't be awakened, immediately call emergency services at 911.  What OTHER INFORMATION should I know?  Keep all appointments with your doctor and the laboratory. Your doctor will order certain lab tests to check your body's response to cyanocobalamin  injection.  It is important for you to keep a written list of all of the prescription and nonprescription (over-the-counter) medicines you are taking, as well as any products such as vitamins, minerals, or other dietary supplements. You should bring this list with you each time you visit a doctor or if you are admitted to a hospital. It is also important information to carry with you in case of emergencies.  This report on medications is for your information only, and is not considered individual patient advice. Because of the changing nature of drug information, please consult your physician or pharmacist about specific clinical use.  The [employer] represents that the information provided hereunder was  formulated with a reasonable standard of care, and in conformity with professional standards in the field. The American Society of [employer]System [employer] Inc. makes no representations or warranties, express or implied, including, but not limited to, any implied warranty of merchantability and/or fitness for a particular purpose, with respect to such information and specifically disclaims all such warranties. Users are advised that decisions regarding drug therapy are complex medical decisions requiring the independent, informed decision of an appropriate health care professional, and the information is provided for informational purposes only. The entire monograph for a drug should be reviewed for a thorough understanding of the drug's actions, uses and side effects. The American Society of [employer] does not endorse or recommend the use of any drug. The information is not a substitute for medical care.  AHFS? Patient Medication Information?. ? Copyright, 2024. [employer] [address], [address], [place], [place] . All Rights Reserved. Duplication for commercial use must be authorized by ASHP.  AHFS? Patient Medication Information?. ? Copyright, 2025

## 2023-10-31 NOTE — Progress Notes
 Date of Service: 10/31/2023    Subjective:             Tina Snyder is a 58 y.o. female patient of Dr. Enid, with PMH fistulizing iliocolonic Crohn's disease (dx age 20), who presents today for FU after hospitalization in March for n/v/d and abd pain and switched from Entyvio  to Stelara  at that time. She presents today for FU.     History of Present Illness     Tina Snyder is a 58 year old female with ileocolonic Crohn's disease who presents for follow-up.    She has a history of ileocolonic Crohn's disease, diagnosed at age 10. In March, she was hospitalized due to severe vomiting, diarrhea, and abdominal pain. An upper endoscopy during the hospitalization revealed a hiatal hernia. She was switched from Entyvio  to Stelara  after a colonoscopy in February showed inflammation. She has received a Stelara  infusion and two injections, with the last one on July 4th. Despite treatment, she experiences persistent abdominal pain, now more on her sides. She is on budesonide  6 mg daily for several years and has not attempted to taper the dose.    She has a history of GERD and underwent a fundoplication many years ago. An EGD showed a loose wrap. She switched from Dexilant  to vonoprazan 10 mg daily for heartburn, but there is no improvement in reflux symptoms. She continues to take Pepcid  40 mg twice daily.    She was recently diagnosed with stage four kidney disease and has a renal scan scheduled for the end of the month. She has a history of high blood pressure and diabetes, managed with Bystolic  and amlodipine . She urinates three to four times a day, more frequently at night.    She experiences early satiety and nausea. She is on Mounjaro  for diabetes, which she takes every 10 to 14 days, but it has not improved her symptoms. Her appetite is poor, and she feels full quickly. She also experiences 'egg burps' and bloating.    Her medication regimen includes Dexilant , Pepcid , Entyvio , budesonide , Miralax , and Zofran  as needed. She has a history of B12 deficiency, previously managed with injections, but is not currently on supplementation. She experiences easy bruising and changes in vision, which she attributes to aging and diabetes.       History of fistulizing ileocolonic CD  - Diagnosed at age 40, primary site: ileocolic area (per patient) and was complicated with bowel perforation, perianal abscess, enterocolic and enterocutanous fistulas that required multiple abdominal surgeries.  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)  - She had an allergic reaction to sulfasalazine such as skin hives and N&V  - She was then maintained on Methotrexate and Pentasa 1000 mg QID   - 2014: ex-lap for extensive adhesiolysis, partial small bowel resection ( 8.5 cm , terminal ileum) and partial sigmoid colon resection ( 9.5 cm) with fistulectomy   - 2016 deep remission (CT and EGD/Colonoscopy histology) but continued with symptoms (nausea, LLQ pain and alternating constipation and diarrhea)  - 04/2016 due to poor response she was switched from Pentasa to Entercort 9 mg daily  - July 2018 MRE without active small bowel inflammation  - 06/2017 with a few ulcers in the neoterminal ileum with biopsies showing ulcerated chronic active ileitis with acute cryptitis and glandular architectural distortion.  Negative for dysplasia and granulomas.  - During GI Clinic visit 09/2018, due  to difficult to control blood sugars, she was attempted to taper off of her Entocort.   Subsequently had an increased symptoms in doing this.  -7/22 EGD: Normal esophagus, Z line regular, Nissen fundoplication was found in the.  Not intact.  Small localized 2 mm antral erosions.  -11/12/2018 Colonoscopy showed a large amount of solid stool obscuring 75% of the mucosa.  4 aphthous ulcers were present at the site of ileocolonic anastomosis, biopsy showing chronic active enteritis with acute cryptitis, crypt abscess, and glandular architectural distortion.  Negative for CMV dysplasia.  - August 2020: Not tolerating Entocort taper. Cleared by ID and pulmonology for initiation of Entyvio , given its gut specificity  - Entyvio  initiated 01/29/20 q8weeks. Remains on entocort 6mg  daily.   - 11/2019 Hospitalized for SBO which was managed conservatively. Also had pancreatitis from unclear etiology. EUS 2 months later showed evidence of chronic pancreatitis.   - 04/2022: MRE, EGD and Colonoscopy. Unremarkable EGD and MRE but poor prep of colon and thus colonoscopy was unsuccessful (despite extensive bowel prep)  - 12/2022: treated with Doxycycline  for 10 days empirically for possible SIBO as patient was having diarrhea and LUQ pain, unclear if any benefits  06/2023- Changed from Entyvio  to Stelara  every 8 weeks- no noticeable subjective improvement as of 10/31/23.           Results:    EGD 07/03/23-  - Normal esophagus.                                 - Z-line, 37 cm from the incisors.                                 - A fundoplication was found. The wrap appears                                 loose.                                 - Erythematous mucosa in the antrum. Biopsied.                                 - Normal examined duodenum.       Colonoscopy 06/10/23-  - Preparation of the colon was fair.                                 - Scattered linear small aphtha in the distal                                 ileum. Biopsied.                                 - Patent side-to-side ileo-colonic anastomosis,                                 characterized by healthy appearing mucosa.                                 -  Patent side-to-side colo-colonic anastomosis,                                 characterized by healthy appearing mucosa.                                 - Diverticulosis in the sigmoid colon.                                 - Non-bleeding external hemorrhoids.                                 - Biopsies were taken with a cold forceps for                                 histology in the entire colon.     Final Diagnosis   A. Stomach, gastric bxs r/o H. Pylori, biopsy:               Mild reactive gastropathy.               No H. pylori-like organisms are identified on H and E sections.         Objective:         amLODIPine  (NORVASC ) 5 mg tablet Take one tablet by mouth daily.    ascorbic acid (VITAMIN C) 500 mg chewable tablet Chew two tablets by mouth daily.    atorvastatin  (LIPITOR) 40 mg tablet TAKE 1 TABLET BY MOUTH EVERY DAY    budesonide  (ENTOCORT EC ) 3 mg capsule TAKE 2 CAPSULES BY MOUTH EVERY DAY    buPROPion  XL (WELLBUTRIN  XL) 300 mg tablet TAKE ONE TABLET BY MOUTH EVERY MORNING. DO NOT CRUSH OR CHEW.    cyanocobalamin  (vitamin B-12) (RUBRAMIN  PC) 1,000 mcg/mL injection solution Inject 1 mL into the muscle every 7 days for 30 days, THEN 1 mL every 30 days for 30 days. Indications: inadequate vitamin B12    DEXCOM G7 SENSOR sensor device Use one each as directed every 10 days. Indications: type 2 diabetes mellitus    EPINEPHrine  (AUVI-Q ) 1 mg/mL injection pen (2-Pack) Inject 0.3 mg (1 Pen) into thigh if needed for anaphylactic reaction. May repeat in 5-15 minutes if needed.    famotidine  (PEPCID ) 40 mg tablet Take one tablet by mouth twice daily.    glucagon  (BAQSIMI ) 3 mg/actuation nasal spray Use 1 spray in single nostril. If no response, may repeat in 15 minutes using second device.    guaiFENesin  LA (MUCINEX ) 600 mg tablet Take one tablet by mouth twice daily as needed.    hydrOXYzine  HCL (ATARAX ) 25 mg tablet Take one tablet by mouth three times daily as needed (allergies).    insulin  aspart (U-100) (NOVOLOG  FLEXPEN U-100 INSULIN ) 100 unit/mL (3 mL) PEN Inject five Units to ten Units under the skin three times daily with meals.    insulin  glargine (BASAGLAR  KWIKPEN U-100 INSULIN ) 100 unit/mL (3 mL) subcutaneous PEN Inject thirty six Units under the skin daily. (Patient taking differently: Inject twenty eight Units under the skin daily.)    levalbuterol  tartrate (XOPENEX  HFA) 45 mcg/actuation inhaler Inhale two puffs by mouth into the lungs every 4-6 hours as  needed for Wheezing or Shortness of Breath.    multivit-min/iron /FA/vit K/lut (CENTRUM SILVER WOMEN PO) Take 1 tablet by mouth daily.    nebivoloL  (BYSTOLIC ) 10 mg tablet Take one tablet by mouth daily.    ondansetron  (ZOFRAN  ODT) 4 mg rapid dissolve tablet Dissolve one tablet by mouth every 8 hours as needed for Nausea or Vomiting. Place on tongue to dissolve.    pen needle, diabetic (BD NANO 2ND GEN PEN NEEDLE) 32 gauge x 5/32 pen needle Use with insulin  injections four times daily  Indications: e11.22    pramoxine-hydrocortisone  (ANALPRAM -HC) 1-1 % rectal cream Insert or Apply one g to rectal area as directed twice daily. (Patient taking differently: Insert or Apply one g to rectal area as directed twice daily as needed.)    tirzepatide  (MOUNJARO ) 2.5 mg/0.5 mL injector PEN Inject 0.5 mL under the skin every 7 days.    ustekinumab  (STELARA ) 90 mg syringe Inject 1 mL under the skin every 8 weeks.    vonoprazan (VOQUEZNA) 10 mg tablet Take one tablet by mouth daily.     Vitals:    10/31/23 1133   BP: 138/78   BP Source: Arm, Right Upper   Pulse: 93   PainSc: Five   Weight: 86.1 kg (189 lb 14.4 oz)   Height: 162.6 cm (5' 4)     Body mass index is 32.6 kg/m?SABRA     Physical Exam  Vitals and nursing note reviewed.   Constitutional:       General: She is awake.      Appearance: Normal appearance.   HENT:      Head: Normocephalic.      Mouth/Throat:      Mouth: Mucous membranes are moist.   Eyes:      General: Vision grossly intact.      Conjunctiva/sclera: Conjunctivae normal.      Pupils: Pupils are equal, round, and reactive to light.   Pulmonary:      Effort: Pulmonary effort is normal.   Abdominal:      Palpations: Abdomen is soft.      Tenderness: There is no abdominal tenderness.   Musculoskeletal: General: No swelling or tenderness. Normal range of motion.      Cervical back: Normal range of motion and neck supple.   Skin:     General: Skin is warm and dry.      Findings: No rash.   Neurological:      General: No focal deficit present.      Mental Status: She is alert and oriented to person, place, and time. Mental status is at baseline.      Gait: Gait is intact.   Psychiatric:         Mood and Affect: Mood normal.           ABDOMEN: Tenderness in the right abdomen, more pronounced on the right side compared to the left.                Assessment and Plan:  Tina Snyder was seen today for follow up.    Diagnoses and all orders for this visit:    Crohn's disease of small intestine with other complication (CMS-HCC)  -     CALPROTECTIN, FECAL; Future; Expected date: 10/31/2023  -     VITAMIN B12; Future; Expected date: 10/31/2023    Chronic pancreatitis, unspecified pancreatitis type (CMS-HCC)  -     25-OH VITAMIN D (D2 + D3); Future; Expected date: 10/31/2023  -  CBC; Future; Expected date: 10/31/2023  -     CBC AND DIFF; Future; Expected date: 10/31/2023  -     COMPREHENSIVE METABOLIC PANEL; Future; Expected date: 10/31/2023  -     C REACTIVE PROTEIN (CRP); Future; Expected date: 10/31/2023  -     SED RATE; Future; Expected date: 10/31/2023  -     FOLATE, SERUM; Future; Expected date: 10/31/2023  -     NM GASTRIC EMPTYING TIME; Future; Expected date: 10/31/2023  -     CALPROTECTIN, FECAL; Future; Expected date: 10/31/2023  -     VITAMIN B12; Future; Expected date: 10/31/2023    Constipation, unspecified constipation type  -     25-OH VITAMIN D (D2 + D3); Future; Expected date: 10/31/2023  -     CBC; Future; Expected date: 10/31/2023  -     CBC AND DIFF; Future; Expected date: 10/31/2023  -     COMPREHENSIVE METABOLIC PANEL; Future; Expected date: 10/31/2023  -     C REACTIVE PROTEIN (CRP); Future; Expected date: 10/31/2023  -     SED RATE; Future; Expected date: 10/31/2023  -     FOLATE, SERUM; Future; Expected date: 10/31/2023  -     NM GASTRIC EMPTYING TIME; Future; Expected date: 10/31/2023    Gastroesophageal reflux disease without esophagitis  -     25-OH VITAMIN D (D2 + D3); Future; Expected date: 10/31/2023  -     CBC; Future; Expected date: 10/31/2023  -     CBC AND DIFF; Future; Expected date: 10/31/2023  -     COMPREHENSIVE METABOLIC PANEL; Future; Expected date: 10/31/2023  -     C REACTIVE PROTEIN (CRP); Future; Expected date: 10/31/2023  -     SED RATE; Future; Expected date: 10/31/2023  -     FOLATE, SERUM; Future; Expected date: 10/31/2023  -     NM GASTRIC EMPTYING TIME; Future; Expected date: 10/31/2023  -     CALPROTECTIN, FECAL; Future; Expected date: 10/31/2023  -     VITAMIN B12; Future; Expected date: 10/31/2023    Medication management    S/P right colectomy    B12 deficiency  -     CALPROTECTIN, FECAL; Future; Expected date: 10/31/2023  -     VITAMIN B12; Future; Expected date: 10/31/2023    Immunosuppression    Type 2 diabetes mellitus without complication, with long-term current use of insulin  (CMS-HCC)    Stage 3 chronic kidney disease, unspecified whether stage 3a or 3b CKD (CMS-HCC)  -     CALPROTECTIN, FECAL; Future; Expected date: 10/31/2023  -     VITAMIN B12; Future; Expected date: 10/31/2023    Hypertension, unspecified type    Early satiety    Pain of upper abdomen    Other orders  -     cyanocobalamin  (vitamin B-12) (RUBRAMIN  PC) 1,000 mcg/mL injection solution; Inject 1 mL into the muscle every 7 days for 30 days, THEN 1 mL every 30 days for 30 days. Indications: inadequate vitamin B12             Ileocolonic Crohn's disease  Long-standing ileocolonic Crohn's disease with recent hospitalization in March due to severe vomiting, diarrhea, and abdominal pain. Transitioned from Entyvio  to Stelara  in March after colonoscopy findings. Currently on Stelara  with no significant improvement in symptoms. Fecal calprotectin was elevated at 143 in March, indicating intestinal inflammation. Persistent abdominal pain, possibly related to Crohn's or other factors such as gastroparesis.  - Order fecal calprotectin  stool test to assess current intestinal inflammation.  - Continue Stelara  and monitor response.  - Consider adjusting Stelara  dosing frequency based on symptomatology and drug levels.  - Order gastric emptying study to evaluate for gastroparesis as a contributing factor to symptoms.    Gastroparesis  Suspected gastroparesis due to symptoms of early satiety, nausea, and vomiting. Symptoms may be exacerbated by Mounjaro , which is known to slow gastric motility. Differential includes diabetic gastroparesis given history of diabetes.  - Order gastric emptying study to confirm diagnosis of gastroparesis.  - Provide gastroparesis diet recommendations, focusing on low fiber intake and cooked vegetables.  - Consider Emycin if gastroparesis confirmed, she has an allergy to metoclopramide.     Gastroesophageal reflux disease (GERD)  GERD with a previous fundoplication that appears loose. Persistent reflux symptoms despite current treatment with vonoprazan. Previous EGD showed a hiatal hernia and loose fundoplication wrap. Surgical intervention is not considered due to high risk and previous multiple abdominal surgeries.  - Continue vonoprazan as prescribed.  - Consider esophagram, Bravo study, and esophageal manometry if symptoms persist and surgical intervention is reconsidered.    Stage 4 chronic kidney disease  Recently diagnosed with stage 4 chronic kidney disease. Kidney function is reportedly better than before. Under nephrology care with a renal scan scheduled for the end of the month. Frequent acute kidney injury since 2019, with creatinine persistently elevated since 2014.  - Review nephrology notes and renal scan results once available.  - Avoid NSAIDs and contrast studies to prevent further kidney damage.  - Continue low salt diet as advised by nephrology.    Diabetes mellitus with complications  Diabetes with complications, including suspected gastroparesis. A1c has improved to 5.3 with current management. Mounjaro  has been effective in reducing insulin  requirements but may contribute to gastroparesis symptoms.  - Continue Mounjaro  with adjusted dosing schedule to manage blood glucose levels.  - Monitor for symptoms of hypoglycemia and adjust insulin  as needed.    Hypertension  Hypertension managed with Bystolic  and amlodipine . Previously on losartan  and chlorthalidone , which were discontinued post-hospitalization in March.  - Continue Bystolic  and amlodipine  for blood pressure management.  - Monitor blood pressure regularly.    History of cataracts  History of cataracts with previous surgical intervention. Patient reports ongoing vision changes since surgery, with uncertainty about whether vision has improved or worsened.    General Health Maintenance  B12 levels have been fluctuating. Previously on B12 injections, which were stopped when levels normalized.  - Resume B12 injections to maintain adequate levels.  - Consider sublingual B12 supplements if injections are not preferred.         A total of 30 minutes were spent today on this encounter.  This includes preparing for the visit, reviewing medical records, obtaining relevant history, speaking to the patient, interpreting test results, ordering medications and tests and documenting the visit in the electronic health record.

## 2023-11-05 ENCOUNTER — Encounter: Admit: 2023-11-05 | Discharge: 2023-11-05 | Payer: BLUE CROSS/BLUE SHIELD

## 2023-11-06 ENCOUNTER — Encounter: Admit: 2023-11-06 | Discharge: 2023-11-06 | Payer: BLUE CROSS/BLUE SHIELD

## 2023-11-07 ENCOUNTER — Ambulatory Visit: Admit: 2023-11-07 | Discharge: 2023-11-08 | Payer: BLUE CROSS/BLUE SHIELD

## 2023-11-10 ENCOUNTER — Encounter: Admit: 2023-11-10 | Discharge: 2023-11-10 | Payer: BLUE CROSS/BLUE SHIELD

## 2023-11-10 ENCOUNTER — Ambulatory Visit: Admit: 2023-11-10 | Discharge: 2023-11-10 | Payer: BLUE CROSS/BLUE SHIELD

## 2023-11-11 ENCOUNTER — Encounter: Admit: 2023-11-11 | Discharge: 2023-11-11 | Payer: BLUE CROSS/BLUE SHIELD

## 2023-11-11 ENCOUNTER — Ambulatory Visit: Admit: 2023-11-11 | Discharge: 2023-11-12 | Payer: BLUE CROSS/BLUE SHIELD

## 2023-11-13 ENCOUNTER — Encounter: Admit: 2023-11-13 | Discharge: 2023-11-13 | Payer: BLUE CROSS/BLUE SHIELD

## 2023-11-13 MED FILL — STELARA 90 MG/ML SC SYRG: 90 mg/mL | SUBCUTANEOUS | 56 days supply | Qty: 1 | Fill #2 | Status: AC

## 2023-11-14 ENCOUNTER — Encounter: Admit: 2023-11-14 | Discharge: 2023-11-14 | Payer: BLUE CROSS/BLUE SHIELD

## 2023-11-14 DIAGNOSIS — E1122 Type 2 diabetes mellitus with diabetic chronic kidney disease: Principal | ICD-10-CM

## 2023-11-18 ENCOUNTER — Encounter: Admit: 2023-11-18 | Discharge: 2023-11-18 | Payer: BLUE CROSS/BLUE SHIELD

## 2023-11-18 ENCOUNTER — Ambulatory Visit: Admit: 2023-11-18 | Discharge: 2023-11-18 | Payer: BLUE CROSS/BLUE SHIELD

## 2023-11-26 ENCOUNTER — Ambulatory Visit: Admit: 2023-11-26 | Discharge: 2023-11-27 | Payer: BLUE CROSS/BLUE SHIELD

## 2023-11-26 ENCOUNTER — Encounter: Admit: 2023-11-26 | Discharge: 2023-11-26 | Payer: BLUE CROSS/BLUE SHIELD

## 2023-11-28 ENCOUNTER — Encounter: Admit: 2023-11-28 | Discharge: 2023-11-28 | Payer: BLUE CROSS/BLUE SHIELD

## 2023-11-28 NOTE — Progress Notes
 Pharmacy Benefits Investigation    Medication name: ustekinumab  (STELARA ) 90 mg syringe  Medication status: continuation (refill)    The insurance requires a prior authorization for the medication. The prior authorization renewal was submitted via [employer]. Will provide update when available from payor or within 4 business days.    EOC ID: [id number]    Tina Snyder  Specialty Pharmacy Patient Advocate

## 2023-11-30 ENCOUNTER — Encounter: Admit: 2023-11-30 | Discharge: 2023-11-30 | Payer: BLUE CROSS/BLUE SHIELD

## 2023-12-01 ENCOUNTER — Encounter: Admit: 2023-12-01 | Discharge: 2023-12-01 | Payer: BLUE CROSS/BLUE SHIELD

## 2023-12-01 MED FILL — DEXCOM G7 SENSOR MISC DEVI: 30 days supply | Qty: 3 | Fill #3 | Status: AC

## 2023-12-01 NOTE — Progress Notes
 Pharmacy Benefits Investigation    Medication name: ustekinumab  (STELARA ) 90 mg syringe  Medication status: continuation (refill)    The prior authorization renewal was reapproved for Caroleena Paolini from 11/28/2023 through 11/27/2024.    The out of pocket cost today is $0. This cost may change due to factors including but not limited to changes in insurance coverage.    The copay is affordable. The patient is not due for a refill at this time, the prescription will be filled through the normal refill process.      Ellena Sheen  Specialty Pharmacy Patient Advocate

## 2023-12-04 ENCOUNTER — Encounter: Admit: 2023-12-04 | Discharge: 2023-12-04 | Payer: BLUE CROSS/BLUE SHIELD

## 2023-12-05 ENCOUNTER — Encounter: Admit: 2023-12-05 | Discharge: 2023-12-05 | Payer: BLUE CROSS/BLUE SHIELD

## 2023-12-05 ENCOUNTER — Ambulatory Visit: Admit: 2023-12-05 | Discharge: 2023-12-05 | Payer: BLUE CROSS/BLUE SHIELD

## 2023-12-08 ENCOUNTER — Encounter: Admit: 2023-12-08 | Discharge: 2023-12-08 | Payer: BLUE CROSS/BLUE SHIELD

## 2023-12-09 ENCOUNTER — Encounter: Admit: 2023-12-09 | Discharge: 2023-12-09 | Payer: BLUE CROSS/BLUE SHIELD

## 2023-12-09 NOTE — Telephone Encounter
 Patient with Hx of CKD without any hematuria/proteinuria. 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 Hx of diabetes but has UACR of ~73 mcg/mg in 2023 but has been normal since 2024. She has been having positive leukocytes in UA making possibility of AIN/CIN in the setting of crohn's disease. ?oxalate nephropathy given crohn's and diarrhea though no Hx of recurrent kidney stones. Discussed case with Dr. Dietrich. Given no clear etiology, she might need kidney biopsy but given the fact that her creatinine is improving to 1.3 mg/dl, no proteinuria/hematuria and likelihood of finding something treatable is less likely. Based on history/labs and possible differential, she could have AIN in the setting of crohn's which can be treated with steroid but given improving kidney function and improving control of crohn's disease, can hold off on kidney biopsy.     Called patient, discussed about lab and US  finding. Discussed all of the above consideration and about kidney biopsy. Discussed about possible need for kidney biopsy in future base don kidney function. She reports improving GI symptoms. She understands and agree with the plan.

## 2023-12-11 ENCOUNTER — Encounter: Admit: 2023-12-11 | Discharge: 2023-12-11 | Payer: BLUE CROSS/BLUE SHIELD

## 2023-12-11 NOTE — Telephone Encounter
 An encounter has been created for documentation only (often for preparation of an upcoming appointment or for follow up on orders/imaging) and patient does not need contact RN and did not miss a phone call or appointment.     RN prepped patient information for clinic with Randall Benders APRN NP on 02/05/24 at 1330 via in person appointment.     Here for 12 mo follow up  Dx:OSA, Bronchial atresia, chronic resp failure  Pt known to sleep department by Dr. Girtha    PMH of asthma, OSA and obesity      Last SS: 10/10/17 with AHI 133  Noc ox 09/24/22-  oxygen discontinued.    Previous cpap 8-15     LV: 01/15/23 with Randall Benders   Meds:mucinex     IFZ:Jempj  RN obtained download from AV

## 2023-12-18 ENCOUNTER — Encounter: Admit: 2023-12-18 | Discharge: 2023-12-18 | Payer: BLUE CROSS/BLUE SHIELD

## 2023-12-19 ENCOUNTER — Encounter: Admit: 2023-12-19 | Discharge: 2023-12-19 | Payer: BLUE CROSS/BLUE SHIELD

## 2023-12-22 ENCOUNTER — Encounter: Admit: 2023-12-22 | Discharge: 2023-12-22 | Payer: BLUE CROSS/BLUE SHIELD

## 2023-12-24 ENCOUNTER — Encounter: Admit: 2023-12-24 | Discharge: 2023-12-24 | Payer: BLUE CROSS/BLUE SHIELD

## 2023-12-24 MED FILL — DEXCOM G7 SENSOR MISC DEVI: 30 days supply | Qty: 3 | Fill #4 | Status: AC

## 2023-12-26 ENCOUNTER — Encounter: Admit: 2023-12-26 | Discharge: 2023-12-26 | Payer: BLUE CROSS/BLUE SHIELD

## 2023-12-26 NOTE — Telephone Encounter
 Dwayne Macario [facility] Nurse[facility] (supporting Earnie GORMAN Haff, MD) (Selected Message)        12/26/23  8:14 AM  I have had a headache for 4 days now with little to no relief.   Tylenol  doesn?t touch it.   I tried acupressure yesterday and it helped a little, but is back full force.  I can?t eat as it makes me feel nauseous.   I now have vertigo with it, I can?t walk a straight line, have to use the walls to keep going.   Can you help me with relief?

## 2023-12-26 NOTE — Telephone Encounter
 Reason for Conversation  Headache (RED CPI) and Dizziness    Background   Patient reports she has had a severe constant headache for 4 days. Patient has been unable to lay down on her right side due to pain. Patient has not eaten since Wednesday due to nausea. Patient reports she is having loss of balance and cannot walk straight or walk without holding onto walls or having assistance from her husband. Patient has a history of TIA. Patient denies fever or sxs of illness, denies any numbness or tingling that isn't already present for her. Patient instructed to go to ED nearest her which patient reports is in [place]. Patient declined EMS. Routing to PCP and care team for FYI    Disposition   GO TO ED NOW    Reason for Disposition      Unable to walk without falling    1. LOCATION: Where does it hurt?       Top of forehead into temples  2. ONSET: When did the headache start? (e.g., minutes, hours, days)       4 days ago  3. PATTERN: Does the pain come and go, or has it been constant since it started?      constant  4. SEVERITY: How bad is the pain? and What does it keep you from doing?  (e.g., Scale 1-10; mild, moderate, or severe)      severe  5. RECURRENT SYMPTOM: Have you ever had headaches before? If Yes, ask: When was the last time? and What happened that time?       Over a year ago  6. CAUSE: What do you think is causing the headache?      unsure  7. MIGRAINE: Have you been diagnosed with migraine headaches? If Yes, ask: Is this headache similar?       Yes, migraines in the past  8. HEAD INJURY: Has there been any recent injury to the head?       Denies   9. OTHER SYMPTOMS: Do you have any other symptoms? (e.g., fever, stiff neck, eye pain, sore throat, cold symptoms)      Nausea, loss of appetite, dizziness, brain fog,   10. PREGNANCY: Is there any chance you are pregnant? When was your last menstrual period?        N/a    No Additional Information on file.    Protocols Used Headache-A-OH

## 2023-12-27 ENCOUNTER — Encounter: Admit: 2023-12-27 | Discharge: 2023-12-27 | Payer: BLUE CROSS/BLUE SHIELD

## 2023-12-31 ENCOUNTER — Encounter: Admit: 2023-12-31 | Discharge: 2023-12-31 | Payer: BLUE CROSS/BLUE SHIELD

## 2024-01-02 ENCOUNTER — Encounter: Admit: 2024-01-02 | Discharge: 2024-01-02 | Payer: BLUE CROSS/BLUE SHIELD

## 2024-01-04 ENCOUNTER — Encounter: Admit: 2024-01-04 | Discharge: 2024-01-04 | Payer: BLUE CROSS/BLUE SHIELD

## 2024-01-05 MED FILL — STELARA 90 MG/ML SC SYRG: 90 mg/mL | SUBCUTANEOUS | 56 days supply | Qty: 1 | Fill #4 | Status: AC

## 2024-01-07 ENCOUNTER — Encounter: Admit: 2024-01-07 | Discharge: 2024-01-07 | Payer: BLUE CROSS/BLUE SHIELD

## 2024-01-16 ENCOUNTER — Encounter: Admit: 2024-01-16 | Discharge: 2024-01-16 | Payer: BLUE CROSS/BLUE SHIELD

## 2024-01-22 NOTE — Progress Notes
 Date of Service: 01/23/2024    Referring Provider:   Carlean Earnie RAMAN, MD      Subjective:             Tina Snyder is a 58 y.o. female.    History of Present Illness  Tina Snyder is referred for further evaluation of hand pain, crohn's disease.  The history is obtained from the patient and review of the electronic medical record.    She has a past medical history of anxiety, CKD, Crohn's disease, depression, DVT, PE, fibromyalgia, hypertension, hyperlipidemia, OSA, type 2 diabetes, fatty liver    She has a long-standing history of Crohn's disease, diagnosed at age 81, and has undergone an ileocecal resection. Her treatment history includes Humira, Imuran, infliximab, sulfasalazine, methotrexate, Pentasa, Entyvio , and most recently Stelara  since March 2025. A colonoscopy in February 2025 was performed, after which she was hospitalized for a week in March 2025. She recalls being told she was inflamed and that multiple biopsies were taken. She imagines a follow-up colonoscopy may be planned for the future, possibly in January or February 2026.    She experiences significant joint pain affecting her thumbs, ankles, toes, knees, hips, and back, with the most severe pain in her thumbs and ankles. The pain has persisted for about ten years and has progressively worsened. She notes morning stiffness and swelling in her ankles and toes. Various treatments have been attempted, including CBD cream, magnesium  cream, essential oils, and previously Aleve, which helped but which she discontinued due to chronic kidney disease.     She was diagnosed with chronic kidney disease in June 2025, with conflicting reports of stage 3 or 4. This diagnosis necessitated the discontinuation of Aleve and ibuprofen, which had previously helped manage her fibromyalgia symptoms. She reports worsening joint pain since stopping these medications.    She experiences fatigue, dry eyes, dry mouth, and canker sores every other month. She reports numbness and tingling in her feet, but not in her hands. She has a history of a miscarriage in 1996 and underwent a hysterectomy the same year. She does not smoke, consume alcohol, or use recreational drugs. 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       No ocular inflammation  No nasal ulcers or genital ulcers   No nose bleed/sinus issues  No Raynauds  No skin rashes  No GU sxs  No fever  No weight loss  No night sweats  No other joint pain or swelling   No Low back pain   No inflammation in the tendons  No Psoriasis  No recurrent infections  No photosensitivity        Past Medical History:  Past Medical History:    Anxiety    Arthritis    Autoimmune disease    Bleeding tendency    Blood clot in vein    Bowel disease    Cataract    Cerebral artery occlusion with cerebral infarction (CMS-HCC)    CKD (chronic kidney disease) stage 3, GFR 30-59 ml/min (CMS-HCC)    Constipation    Crohn disease (CMS-HCC)    Deep vein thrombosis (DVT) (CMS-HCC)    Depression    Difficult intravenous access    Diverticulitis of colon (without mention of hemorrhage)(562.11)    Dizziness    DM (diabetes mellitus), secondary-steroid-induced    DVT (deep venous thrombosis) (CMS-HCC)    Fibromyalgia  GERD (gastroesophageal reflux disease)    H/O Mycobacterium avium intracellulare infection    Hearing loss    Heart murmur    Hiatal hernia    History of blood transfusion    Hyperlipidemia    Hypertension    Hypertensive heart disease    Inflammatory bowel disease    Irritable bowel disease    Joint pain    Lung disease    Migraines    Nausea    Occlusion of left subclavian vein (CMS-HCC)    Pancreatitis    Pulmonary embolism (CMS-HCC)    Sarcoidosis    SBO (small bowel obstruction) (CMS-HCC)    Seasonal allergic reaction    Seizure disorder (CMS-HCC)    Sleep apnea    Small bowel obstruction (CMS-HCC)    Steatohepatitis    Type 2 diabetes mellitus without complication, with long-term current use of insulin  (CMS-HCC)    Ulcer    Unspecified deficiency anemia    Vaginal tumor        Past Surgical History:  Surgical History:   Procedure Laterality Date    HX ADENOIDECTOMY  05/24/1995    LUNG SURGERY  10/22/2010    lung biopsy    LIVER BIOPSY  12/22/2010    ESOPHAGOGASTRODUODENOSCOPY N/A 03/13/2015    Performed by Tenna Dustman, MD at [facility] ENDO    COLONOSCOPY N/A 03/13/2015    Performed by Tenna Dustman, MD at [facility] ENDO    ESOPHAGOGASTRODUODENOSCOPY BIOPSY  03/13/2015    Performed by Tenna Dustman, MD at [facility] ENDO    COLONOSCOPY BIOPSY  03/13/2015    Performed by Tenna Dustman, MD at [facility] ENDO    ESOPHAGOGASTRODUODENOSCOPY WITH SPECIMEN COLLECTION BY BRUSHING/ WASHING N/A 06/30/2017    Performed by Ledora Catalina, MD at [facility] ENDO    COLONOSCOPY DIAGNOSTIC WITH SPECIMEN COLLECTION BY BRUSHING/ WASHING - FLEXIBLE N/A 06/30/2017    Performed by Ledora Catalina, MD at [facility] ENDO    ESOPHAGOGASTRODUODENOSCOPY WITH BIOPSY - FLEXIBLE N/A 06/30/2017    Performed by Ledora Catalina, MD at [facility] ENDO    COLONOSCOPY WITH BIOPSY - FLEXIBLE  06/30/2017    Performed by Ledora Catalina, MD at [facility] ENDO    COLONOSCOPY DIAGNOSTIC WITH SPECIMEN COLLECTION BY BRUSHING/ WASHING - FLEXIBLE N/A 11/11/2018    Performed by Ronalee Pacific, MD at [facility] ENDO    ESOPHAGOGASTRODUODENOSCOPY WITH SPECIMEN COLLECTION BY BRUSHING/ WASHING N/A 11/11/2018    Performed by Ronalee Pacific, MD at [facility] ENDO    ESOPHAGOGASTRODUODENOSCOPY WITH BIOPSY - FLEXIBLE  11/11/2018    Performed by Ronalee Pacific, MD at [facility] ENDO    COLONOSCOPY DIAGNOSTIC WITH SPECIMEN COLLECTION BY BRUSHING/ WASHING - FLEXIBLE N/A 11/12/2018    Performed by Bonnetta Camellia BROCKS, MD at [facility] ENDO    COLONOSCOPY WITH BIOPSY - FLEXIBLE  11/12/2018    Performed by Bonnetta Camellia BROCKS, MD at [facility] ENDO    ESOPHAGOGASTRODUODENOSCOPY WITH ENDOSCOPIC ULTRASOUND EXAMINATION - FLEXIBLE N/A 03/01/2020    Performed by Verdis Anabel RAMAN, MD at [facility] ENDO COLONOSCOPY WITH BIOPSY - FLEXIBLE N/A 05/08/2022    Performed by Buckles, Toribio BROCKS, MD at [facility] ENDO    ESOPHAGOGASTRODUODENOSCOPY WITH BIOPSY - FLEXIBLE N/A 05/08/2022    Performed by Buckles, Toribio BROCKS, MD at [facility] ENDO    COLONOSCOPY WITH BIOPSY - FLEXIBLE N/A 06/10/2023    Performed by Buckles, Toribio BROCKS, MD at [facility] ENDO    ESOPHAGOGASTRODUODENOSCOPY WITH BIOPSY - FLEXIBLE N/A 07/03/2023    Performed by Riccardo Sane, MD at [facility] ENDO  APPENDECTOMY      BRONCHOSCOPY      CESAREAN SECTION  1991 and 1994    CHOLECYSTECTOMY      FRACTURE SURGERY      GASTRIC FUNDOPLICATION      HEMORRHOIDECTOMY      HX CATARACT REMOVAL  fall 2019    HX CESAREAN SECTION  1991 & 1994    HX EAR TUBES      HX ENDOSCOPY      colonoscopy 2014    HX EYE SURGERY  2019    HX HYSTERECTOMY  02/1995    partial 02/1995, ovaries removed 1999    OOPHORECTOMY      1999 ovaries removed    RECTAL SURGERY      SMALL BOWEL RESECTION      SUBTOTAL COLECTOMY      Ileocolectomy        Social History:  Social History     Socioeconomic History    Marital status: Married     Spouse name: Air cabin crew    Number of children: 2   Occupational History    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     Associate Professor: [employer]   Tobacco Use    Smoking status: Never     Passive exposure: Past    Smokeless tobacco: Never   Vaping Use    Vaping status: Never Used   Substance and Sexual Activity    Alcohol use: Not Currently     Comment: Drink maybe twice a year    Drug use: Never    Sexual activity: Yes     Partners: Male     Birth control/protection: None   Social History Narrative    Married x 28 years; 2 children 30, 28 yo; 2012 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            Family History:  Family History   Problem Relation Name Age of Onset    Hypertension Mother Willy     Depression Mother Patti     Basal Cell Carcinoma Mother Patti     Crohn's Disease Mother Willy     Diabetes Mother Willy     Dementia Mother Willy     Hypertension Father Darina     High Cholesterol Father Randy     Depression Father Darina     Liver Disease Father Darina     Kidney Failure Father Darina     Alcohol abuse Father Darina     Celiac Disease Son [place]     Asthma Son Ulen     Hypertension Maternal Grandmother Edgar     Stroke Maternal Grandmother Naomi     Heart Failure Maternal Apolinar [place]     Stroke Maternal Grandfather Ken     Migraines Maternal Grandfather Ken     Cancer-Breast Paternal Grandmother Ronal     Cancer Paternal Grandmother Mary         breast    Hypertension Paternal Grandmother Ronal     High Cholesterol Paternal Grandmother Mary     Depression Paternal Grandmother Mary     Heart Failure Paternal Apolinar Ruth     Hypertension Paternal Apolinar Ruth     Blood Clots Neg Hx      COPD Neg Hx      Coronary Artery Disease Neg Hx  Cystic Fibrosis Neg Hx      DVT Neg Hx      Pulmonary Embolism Neg Hx      Pulmonary Fibrosis Neg Hx      Pulmonary HTN Neg Hx      Melanoma Neg Hx      Irritable Bowel Disease Mother Willy     Arthritis Father Darina        Allergies:  Allergies[1]    Medications:   amLODIPine  (NORVASC ) 5 mg tablet Take one tablet by mouth daily.    ascorbic acid (VITAMIN C) 500 mg chewable tablet Chew two tablets by mouth daily.    atorvastatin  (LIPITOR) 40 mg tablet Take one tablet by mouth daily.    budesonide  (ENTOCORT EC ) 3 mg capsule TAKE 2 CAPSULES BY MOUTH EVERY DAY    buPROPion  XL (WELLBUTRIN  XL) 300 mg tablet Take one tablet by mouth every morning. Do not crush or chew.    cyclobenzaprine  (FLEXERIL ) 10 mg tablet Take one tablet by mouth at bedtime as needed for Muscle Cramps.    DEXCOM G7 SENSOR sensor device Use one each as directed every 10 days. Indications: type 2 diabetes mellitus    EPINEPHrine  (AUVI-Q ) 1 mg/mL injection pen (2-Pack) Inject 0.3 mg (1 Pen) into thigh if needed for anaphylactic reaction. May repeat in 5-15 minutes if needed.    famotidine  (PEPCID ) 40 mg tablet Take one tablet by mouth twice daily.    glucagon  (BAQSIMI ) 3 mg/actuation nasal spray Use 1 spray in single nostril. If no response, may repeat in 15 minutes using second device.    guaiFENesin  LA (MUCINEX ) 600 mg tablet Take one tablet by mouth twice daily as needed.    hydrOXYzine  HCL (ATARAX ) 25 mg tablet Take one tablet by mouth three times daily as needed (allergies).    insulin  aspart (U-100) (NOVOLOG  FLEXPEN U-100 INSULIN ) 100 unit/mL (3 mL) PEN Inject five Units to ten Units under the skin three times daily with meals.    insulin  glargine (BASAGLAR  KWIKPEN U-100 INSULIN ) 100 unit/mL (3 mL) subcutaneous PEN Inject twenty two Units under the skin daily.    levalbuterol  tartrate (XOPENEX  HFA) 45 mcg/actuation inhaler Inhale two puffs by mouth into the lungs every 4-6 hours as needed for Wheezing or Shortness of Breath.    multivit-min/iron /FA/vit K/lut (CENTRUM SILVER WOMEN PO) Take 1 tablet by mouth daily.    nebivoloL  (BYSTOLIC ) 10 mg tablet Take one tablet by mouth daily.    ondansetron  (ZOFRAN  ODT) 4 mg rapid dissolve tablet Dissolve one tablet by mouth every 8 hours as needed for Nausea or Vomiting. Place on tongue to dissolve.    pen needle, diabetic (BD NANO 2ND GEN PEN NEEDLE) 32 gauge x 5/32 pen needle Use with insulin  injections four times daily  Indications: e11.22    pramoxine-hydrocortisone  (ANALPRAM -HC) 1-1 % rectal cream Insert or Apply one g to rectal area as directed twice daily. (Patient taking differently: Insert or Apply one g to rectal area as directed twice daily as needed.)    syringe with needle 1 mL 25 gauge x 1 Use  as directed every 7 days. To administer vitamin B12 intramuscular injection every 7 days    tirzepatide  (MOUNJARO ) 2.5 mg/0.5 mL injector PEN Inject 0.5 mL under the skin every 7 days.    ustekinumab  (STELARA ) 90 mg syringe Inject 1 mL under the skin every 8 weeks.    vonoprazan (VOQUEZNA) 10 mg tablet Take one tablet by mouth daily.       Immunizations:  Immunization History   Administered Date(s) Administered    COVID-19 (JOHNSON & JOHNSON/JANSSEN) recombinant protein vacc, 0.5 mL (PF) 07/22/2019, 07/22/2019    COVID-19 (MODERNA), mRNA vacc, 100 mcg/0.5 mL (PF) 03/23/2020, 07/20/2020    COVID-19 Bivalent (27YR+)(PFIZER), mRNA vacc, 40mcg/0.3mL 10/11/2021    Covid-19 mRNA Vaccine >=12yo (Moderna)(Spikevax) 06/12/2022, 02/20/2023    FLU VACCINE >3YO 01/10/2011, 01/06/2012    FLU VACCINE >3YO (Preservative Free) 03/04/2011    Flu vaccine, inj unspecified (Historical) 05/17/2019, 02/06/2022    Pneumococcal Vaccine (20-Val) 08/28/2022    Pneumococcal Vaccine (23-Val Adult) 02/26/2011, 09/19/2020    Tdap Vaccine 09/30/2017    Zoster Vaccine Recombinant, Adjuvanted (shingles) IM (vial 2 of 2)(SHINGRIX) 10/11/2021, 02/06/2022    Zoster Vaccine Recombinant, adjuvant suspension component (vial 1 of 2)(SHINGRIX) 10/11/2021, 02/06/2022            Objective:           Vitals:    01/23/24 1238   BP: (!) 148/91   BP Source: Arm, Right Upper   Pulse: 71   Temp: 36.4 ?C (97.6 ?F)   SpO2: 100%   TempSrc: Oral   PainSc: Six   Weight: 84.4 kg (186 lb)   Height: 162.6 cm (5' 4)     Body mass index is 31.93 kg/m?SABRA     Physical Exam    General: Alert and oriented, no acute distress.  Eye: Clear conjunctiva and lids.  HEENT: Moist mucous membranes with no oral or nasal ulcers.  Neuro: Grossly non-focal  CV: Regular rate and rhythm; no murmur/gallop/rub.  Vessels: Normal radial pulses.  Pulm: Clear to auscultation bilaterally.  Skin: no suspicious lesions visualized.  MSK: no synovitis, warmth or erythema in any of the joints of the upper or lower extremities bilaterally.  ROM was normal except as below  Tenderness bilateral first MCPs without synovitis  Bony enlargement bilateral first MTPs and IP joints of bilateral second toes    Infection Monitoring:  TB Screening  Quantiferon TB   Date Value Ref Range Status   09/11/2007 SEE CHARTMAXX FOR COMPLETE REPORT.  Final       Hepatitis B Screening  HBsAg   Date/Time Value Ref Range Status   01/29/2019 09:10 AM Non-Reactive: HBs antigen not detected NRHB-Non-Reactive: HBs antigen not detected Final     Anti HBc Total   Date Value Ref Range Status   01/29/2019  NRHBV-Non-Reactive: Antibodies to HBV core antigen (anti-HBc Final    Non-Reactive: Antibodies to HBV core antigen (anti-HBc)were not detected.     Anti HBs   Date/Time Value Ref Range Status   01/29/2019 09:16 AM  NHBV-Negative: Individual is considered to be non-immune to Final    Negative: Individual is considered to be non-immune to HBV infection.         Laboratory Summary:  Pertinent labs reviewed    CBC w diff    Lab Results   Component Value Date/Time    WBC 7.60 11/07/2023 11:10 AM    RBC 3.89 (L) 11/07/2023 11:10 AM    HGB 11.7 (L) 11/07/2023 11:10 AM    HCT 34.0 (L) 11/07/2023 11:10 AM    MCV 87.4 11/07/2023 11:10 AM    MCH 30.2 11/07/2023 11:10 AM    MCHC 34.6 11/07/2023 11:10 AM    RDW 14.9 11/07/2023 11:10 AM    PLTCT 229 11/07/2023 11:10 AM    MPV 8.6 11/07/2023 11:10 AM    Lab Results   Component Value Date/Time    NEUT 65.5 11/07/2023 11:10  AM    ANC 4.90 11/07/2023 11:10 AM    LYMA 24.4 11/07/2023 11:10 AM    ALC 1.80 11/07/2023 11:10 AM    MONA 7.6 11/07/2023 11:10 AM    AMC 0.60 11/07/2023 11:10 AM    EOSA 1.9 11/07/2023 11:10 AM    AEC 0.10 11/07/2023 11:10 AM    BASA 0.6 11/07/2023 11:10 AM    ABC 0.00 11/07/2023 11:10 AM        Basic Metabolic Profile    Lab Results   Component Value Date/Time    NA 139 11/07/2023 11:10 AM    K 3.7 11/07/2023 11:10 AM    CA 9.2 11/07/2023 11:10 AM    CL 105 11/07/2023 11:10 AM    CO2 24 11/07/2023 11:10 AM    GAP 10 11/07/2023 11:10 AM    Lab Results   Component Value Date/Time    BUN 22 11/07/2023 11:10 AM    CR 1.35 (H) 11/07/2023 11:10 AM    GLU 86 11/07/2023 11:10 AM        Hepatic Function    Lab Results   Component Value Date/Time    ALBUMIN 4.1 11/07/2023 11:10 AM    TOTPROT 6.3 11/07/2023 11:10 AM    ALKPHOS 109 11/07/2023 11:10 AM    Lab Results   Component Value Date/Time    AST 14 11/07/2023 11:10 AM    ALT 11 11/07/2023 11:10 AM    TOTBILI 0.5 11/07/2023 11:10 AM        ESR CRP   Lab Results   Component Value Date/Time    ESR 5 11/07/2023 11:10 AM      Lab Results   Component Value Date/Time    CRP 0.10 12/05/2023 12:02 PM        Autoimmune serologies:  Lab Results   Component Value Date    C3 173.0 10/23/2015    C4 32.0 10/23/2015    DNA <10 10/23/2015    SCL70AB <0.2 03/23/2020    ASMSC <20 10/17/2010    ASSB <0.2 03/23/2020    ASSB NEG 10/23/2015    ASSA <0.2 03/23/2020    ASSA NEG 10/23/2015     ANA screen negative   previous ENA has been negative  RF, CCP negative  Negative ANCA panel   negative HLA-B27    Radiology Summary:           Assessment and Plan:  Tina Snyder is a 58 y.o. female with past medical history of anxiety, CKD, Crohn's disease, depression, DVT, PE, fibromyalgia, hypertension, hyperlipidemia, OSA, type 2 diabetes, fatty liver who presents today for further evaluation of hand pain, crohn's disease, positive ANA.      She has a longstanding history of Crohn's disease and has previously been on multiple TNF inhibitors, conventional immunosuppressive's, and most recently on Stelara  since March 2025.  She does report mild gastrointestinal symptoms and might get a repeat colonoscopy in the next few months.  She reports almost a decade of polyarthralgia and lower extremity joints as well as bilateral first MCPs without clear inflammatory features.  She was on Aleve which helped with the symptoms but had to stop due to CKD.  She denies symptoms of dactylitis, enthesitis, inflammatory eye disease, psoriasis.  She also reports fatigue, sicca symptoms and occasional canker sores, also has a history of miscarriage/DVT/PE.  Examination without any concerning rashes or peripheral synovitis, she has tenderness of bilateral first MCPs, bony enlargement of bilateral first MTPs and IP joints of bilateral  second toes.  Previous ANA, ENA, RF, CCP, HLA-B27, ANCA panel negative.    I considered IBD arthropathy but no inflammatory features by history or examination and no other manifestations, also on Stelara  which can manage joint symptoms. 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  Fibromyalgia.  I injected her right first MCP with steroids, recommended closely monitoring her blood sugar.  She wishes to come back for her left first MCP steroid injection because she drove alone today.  I will also obtain workup for APS given her prior history and obtain imaging as below.      Recommendations:  Lab work and imaging as below  Injected R1 MCP with 20 mg methylprednisolone  and 0.2 cc lidocaine   Postinjection instructions provided  Trial topical diclofenac  gel 3-4 times a day as needed, Tylenol  arthritis up to a max of 3 g/day as needed  She would like to come back for L1 MCP steroid injection  Might consider pain clinic referral        Return Visit: 2 months    Orders Placed This Encounter    JOINT SRVY 1 VW 2+ JOINTS    ANKLE MIN 3 VIEWS BILATERAL    SI JOINTS MIN 3 VIEWS    Hex Lupus Anticoagulant    Dilute Russell Viper Venom    Beta 2 Glycoprotein 1 Ab, IgM    Beta 2 Glycoprotein 1 Ab, IgG    Cardiolipin Ab IgG/IgM    Injection Small Joint/Bursa W/O US  [20600]    Injection Small Joint/Bursa W/O US  [20600]    methylPREDNISolone  acetate (DEPO-Medrol ) injection 20 mg    lidocaine  PF 1% (10 mg/mL) injection 0.2 mL     Total of 60 minutes were spent on the same day of the visit including preparing to see the patient, obtaining and/or reviewing separately obtained history, performing a medically appropriate examination and/or evaluation, counseling and educating the patient/family/caregiver, ordering medications, tests, or procedures, referring and communication with other health care professionals, documenting clinical information in the electronic or other health record, independently interpreting results and communicating results to the patient/family/caregiver, and care coordination.      Tito Dennise COME  Assistant Professor of Medicine  Rheumatology     Because this dictation was prepared with voice recognition software, there remains a potential for typographical errors or incorrect word choices by the system. We apologize for any inadvertent inconvenience from such an error.     CC:  Dr. Carlean Carlean, Earnie RAMAN                [1]   Allergies  Allergen Reactions    Bee Venom Protein (Honey Bee) ANAPHYLAXIS    Ciprofloxacin  VOMITING    Enoxaparin SEE COMMENTS and UNKNOWN     Hemorrhage    Morphine HALLUCINATIONS and ANAPHYLAXIS     States she quits breathing    Warfarin SEE COMMENTS and UNKNOWN     Hemorrhage    Metoclopramide SEE COMMENTS and RASH     Patient states that she cannot tolerate.    Metronidazole HIVES    Bee [Bumble Bee] UNKNOWN    Ciprofloxacin  NAUSEA AND VOMITING    Stadol [Butorphanol Tartrate] SEE COMMENTS     Patient states that it made her loopy and drunk.    Tysabri [Natalizumab] SEE COMMENTS     JC virus positive.  Do not use Tysabri - high risk for  PML    Walnut UNKNOWN    Zyrtec [Cetirizine] UNKNOWN    Aspartame UNKNOWN    Butorphanol UNKNOWN    Farxiga  [Dapagliflozin ] SEE COMMENTS     Recurrent yeast infections    Pecan Nut UNKNOWN    Prochlorperazine HIVES, SEE COMMENTS and UNKNOWN     Tolerates Phenergan     Sulfa (Sulfonamide Antibiotics) HIVES, NAUSEA AND VOMITING and UNKNOWN    Sulfasalazine UNKNOWN    Topiramate UNKNOWN

## 2024-01-23 ENCOUNTER — Ambulatory Visit: Admit: 2024-01-23 | Discharge: 2024-01-24 | Payer: BLUE CROSS/BLUE SHIELD

## 2024-01-23 ENCOUNTER — Encounter: Admit: 2024-01-23 | Discharge: 2024-01-23 | Payer: BLUE CROSS/BLUE SHIELD

## 2024-01-23 ENCOUNTER — Ambulatory Visit: Admit: 2024-01-23 | Discharge: 2024-01-23 | Payer: BLUE CROSS/BLUE SHIELD

## 2024-01-23 VITALS — BP 148/91 | HR 71 | Temp 97.60000°F | Ht 64.0 in | Wt 186.0 lb

## 2024-01-23 DIAGNOSIS — R5383 Other fatigue: Secondary | ICD-10-CM

## 2024-01-23 DIAGNOSIS — Z86718 Personal history of other venous thrombosis and embolism: Principal | ICD-10-CM

## 2024-01-23 DIAGNOSIS — M1990 Unspecified osteoarthritis, unspecified site: Secondary | ICD-10-CM

## 2024-01-23 DIAGNOSIS — M19041 Primary osteoarthritis, right hand: Secondary | ICD-10-CM

## 2024-01-23 DIAGNOSIS — M2550 Pain in unspecified joint: Secondary | ICD-10-CM

## 2024-01-23 DIAGNOSIS — M797 Fibromyalgia: Secondary | ICD-10-CM

## 2024-01-23 DIAGNOSIS — D849 Immunodeficiency, unspecified: Secondary | ICD-10-CM

## 2024-01-23 DIAGNOSIS — Z86711 Personal history of pulmonary embolism: Secondary | ICD-10-CM

## 2024-01-23 DIAGNOSIS — K50918 Crohn's disease, unspecified, with other complication: Secondary | ICD-10-CM

## 2024-01-23 MED ORDER — METHYLPREDNISOLONE ACETATE 80 MG/ML IJ SUSP
20 mg | Freq: Once | INTRA_ARTICULAR | 0 refills | Status: DC
Start: 2024-01-23 — End: 2024-01-23

## 2024-01-23 MED ORDER — METHYLPREDNISOLONE ACETATE 80 MG/ML IJ SUSP
20 mg | Freq: Once | INTRA_ARTICULAR | 0 refills | Status: AC
Start: 2024-01-23 — End: ?

## 2024-01-23 MED ORDER — LIDOCAINE (PF) 10 MG/ML (1 %) IJ SOLN
.2 mL | Freq: Once | INTRA_ARTICULAR | 0 refills | Status: DC
Start: 2024-01-23 — End: 2024-01-23

## 2024-01-23 MED ORDER — LIDOCAINE (PF) 10 MG/ML (1 %) IJ SOLN
.2 mL | Freq: Once | INTRA_ARTICULAR | 0 refills | Status: AC
Start: 2024-01-23 — End: ?

## 2024-01-23 NOTE — Procedures
 Intraarticular Injection(s)    Consent was obtained  Area was prepped in sterile manner.  Location clarified for correct patient, correct location, and correct medication.    Location:  R 1 MCP joint   Injection: Kenalog  20mg , lidocaine  0.2cc    Pain Scale:  Before injection:6/10     Written post-injection instructions were given.    No complications before, during, or after the procedure.

## 2024-01-25 ENCOUNTER — Encounter: Admit: 2024-01-25 | Discharge: 2024-01-25 | Payer: BLUE CROSS/BLUE SHIELD

## 2024-01-26 ENCOUNTER — Encounter: Admit: 2024-01-26 | Discharge: 2024-01-26 | Payer: BLUE CROSS/BLUE SHIELD

## 2024-01-26 DIAGNOSIS — N183 Stage 3 chronic kidney disease, unspecified whether stage 3a or 3b CKD (CMS-HCC): Principal | ICD-10-CM

## 2024-01-28 ENCOUNTER — Ambulatory Visit: Admit: 2024-01-28 | Discharge: 2024-01-29 | Payer: BLUE CROSS/BLUE SHIELD

## 2024-01-28 ENCOUNTER — Ambulatory Visit: Admit: 2024-01-28 | Discharge: 2024-01-28 | Payer: BLUE CROSS/BLUE SHIELD

## 2024-01-29 ENCOUNTER — Encounter: Admit: 2024-01-29 | Discharge: 2024-01-29 | Payer: BLUE CROSS/BLUE SHIELD

## 2024-01-29 ENCOUNTER — Ambulatory Visit: Admit: 2024-01-29 | Discharge: 2024-01-30 | Payer: BLUE CROSS/BLUE SHIELD

## 2024-01-30 ENCOUNTER — Encounter: Admit: 2024-01-30 | Discharge: 2024-01-30 | Payer: BLUE CROSS/BLUE SHIELD

## 2024-01-31 MED FILL — DEXCOM G7 SENSOR MISC DEVI: 30 days supply | Qty: 3 | Fill #5 | Status: AC

## 2024-02-05 ENCOUNTER — Encounter: Admit: 2024-02-05 | Discharge: 2024-02-05 | Payer: BLUE CROSS/BLUE SHIELD

## 2024-02-12 ENCOUNTER — Encounter: Admit: 2024-02-12 | Discharge: 2024-02-12 | Payer: BLUE CROSS/BLUE SHIELD

## 2024-02-18 ENCOUNTER — Encounter: Admit: 2024-02-18 | Discharge: 2024-02-18 | Payer: BLUE CROSS/BLUE SHIELD

## 2024-02-18 DIAGNOSIS — E1122 Type 2 diabetes mellitus with diabetic chronic kidney disease: Principal | ICD-10-CM

## 2024-02-18 MED ORDER — DEXCOM G7 SENSOR MISC DEVI
2 refills | Status: CN
Start: 2024-02-18 — End: ?

## 2024-02-19 ENCOUNTER — Encounter: Admit: 2024-02-19 | Discharge: 2024-02-19 | Payer: BLUE CROSS/BLUE SHIELD

## 2024-02-19 DIAGNOSIS — K5080 Crohn's disease of both small and large intestine without complications: Principal | ICD-10-CM

## 2024-02-19 MED ORDER — USTEKINUMAB 90 MG/ML SC SYRG
90 mg | SUBCUTANEOUS | 3 refills | 38.50000 days | Status: AC
Start: 2024-02-19 — End: ?
  Filled 2024-02-27: qty 1, 56d supply, fill #0

## 2024-02-19 NOTE — Progress Notes [1]
 Contacted Tina Snyder to refill their medication(s) STELARA  90 MG/ML SC SYRG.    On MyChart medication refill questionnaire patient reported new conditions: Chronic kidney disease, stage 3b    The refill was processed. Ambulatory pharmacist notified.      Tina [facility]  [phone]

## 2024-02-19 NOTE — Telephone Encounter [36]
 Refill request received for:    ustekinumab  (STELARA ) 90 mg syringe     Sig: Inject 1 mL under the skin every 8 weeks.       Last OV: 10/31/23  Last labs: 01/28/24    Routing to Dr. Enid for approval/refusal

## 2024-02-20 ENCOUNTER — Encounter: Admit: 2024-02-20 | Discharge: 2024-02-20 | Payer: BLUE CROSS/BLUE SHIELD

## 2024-02-22 ENCOUNTER — Encounter: Admit: 2024-02-22 | Discharge: 2024-02-22 | Payer: BLUE CROSS/BLUE SHIELD

## 2024-02-23 ENCOUNTER — Encounter: Admit: 2024-02-23 | Discharge: 2024-02-23 | Payer: BLUE CROSS/BLUE SHIELD

## 2024-02-23 MED FILL — DEXCOM G7 SENSOR MISC DEVI: 30 days supply | Qty: 3 | Fill #0 | Status: AC

## 2024-02-24 ENCOUNTER — Encounter: Admit: 2024-02-24 | Discharge: 2024-02-24 | Payer: BLUE CROSS/BLUE SHIELD

## 2024-02-25 ENCOUNTER — Encounter: Admit: 2024-02-25 | Discharge: 2024-02-25 | Payer: BLUE CROSS/BLUE SHIELD

## 2024-02-26 ENCOUNTER — Encounter: Admit: 2024-02-26 | Discharge: 2024-02-26 | Payer: BLUE CROSS/BLUE SHIELD

## 2024-02-27 ENCOUNTER — Encounter: Admit: 2024-02-27 | Discharge: 2024-02-27 | Payer: BLUE CROSS/BLUE SHIELD

## 2024-02-27 ENCOUNTER — Ambulatory Visit: Admit: 2024-02-27 | Discharge: 2024-02-28 | Payer: BLUE CROSS/BLUE SHIELD

## 2024-02-27 ENCOUNTER — Ambulatory Visit: Admit: 2024-02-27 | Discharge: 2024-02-27 | Payer: BLUE CROSS/BLUE SHIELD

## 2024-03-01 ENCOUNTER — Encounter: Admit: 2024-03-01 | Discharge: 2024-03-01 | Payer: BLUE CROSS/BLUE SHIELD

## 2024-03-08 ENCOUNTER — Encounter: Admit: 2024-03-08 | Discharge: 2024-03-08 | Payer: BLUE CROSS/BLUE SHIELD

## 2024-03-08 DIAGNOSIS — F324 Major depressive disorder, single episode, in partial remission: Principal | ICD-10-CM

## 2024-03-08 MED ORDER — BUPROPION XL 300 MG PO TB24
300 mg | ORAL_TABLET | Freq: Every morning | ORAL | 2 refills | 30.00000 days | Status: AC
Start: 2024-03-08 — End: ?

## 2024-03-08 NOTE — Telephone Encounter [36]
 1. Received e-request from pharmacy requesting new Rx for   Requested Prescriptions     Pending Prescriptions Disp Refills    buPROPion  XL (WELLBUTRIN  XL) 300 mg tablet [Pharmacy Med Name: BUPROPION  HCL XL 300 MG TABLET] 90 tablet 2     Sig: TAKE ONE TABLET BY MOUTH EVERY MORNING. DO NOT CRUSH OR CHEW.   .    2. Last Rx written: 12/05/2023  (#90 x1) by PCP    3. Last Gen Med Visit: in person  on 12/05/2023    4. Next Appt due: Provider recommended return date from last office visit: n/a    Future Appointments   Date Time Provider Department Center   03/29/2024  1:00 PM Dennise Hedge, MD [facility]   04/07/2024  1:00 PM Peters, Meghan M, PA-C [facility]   05/25/2024  2:00 PM Con Randall CROME, APRN-NP [facility]   05/31/2024  1:30 PM Tobie Mailman, MBBS [facility]   12/15/2024 11:00 AM Brubacher, Earnie RAMAN, MD [facility]       5. All protocol criteria met? Yes, last office visit assessment and plan reviewed.     Reason Protocol Failed: N/A    Courtesy Refills: N/A    Patient due for an office visit in 1,2, or 3 months 90 day supply, 0 refills   Patient due for an office visit in 4,5, or 6 months 90 day supply, 1 refill   Patient due for an office visit in 7,8, or 9 months 90 day supply, 2 refills   Patient due for an office visit in 10,11, or 12 months 90 day supply, 3 refills

## 2024-03-15 ENCOUNTER — Encounter: Admit: 2024-03-15 | Discharge: 2024-03-15 | Payer: BLUE CROSS/BLUE SHIELD

## 2024-03-23 ENCOUNTER — Encounter: Admit: 2024-03-23 | Discharge: 2024-03-23 | Payer: BLUE CROSS/BLUE SHIELD

## 2024-03-23 MED FILL — DEXCOM G7 SENSOR MISC DEVI: 30 days supply | Qty: 3 | Fill #1 | Status: AC

## 2024-03-26 ENCOUNTER — Encounter: Admit: 2024-03-26 | Discharge: 2024-03-26 | Payer: BLUE CROSS/BLUE SHIELD

## 2024-03-26 DIAGNOSIS — E1122 Type 2 diabetes mellitus with diabetic chronic kidney disease: Principal | ICD-10-CM

## 2024-03-28 NOTE — Progress Notes [1]
 03/29/2024  Subjective:  Tina Snyder is a 58 y.o. female Jul 31, 1965 who returns for a follow-up of polyarthralgia, mechanical and/or fibromyalgia, crohn's disease    Rheumatological History:  #polyarthralgia, mechanical and/or fibromyalgia, crohn's disease  -Initial consult on 01/23/2024  -She has a longstanding history of Crohn's disease and has previously been on multiple TNF inhibitors, conventional immunosuppressive's, and most recently on Stelara  since March 2025.    -She does report mild gastrointestinal symptoms and might get a repeat colonoscopy in the next few months.  She reports almost a decade of polyarthralgia in lower extremity joints as well as bilateral first MCPs without clear inflammatory features.    - She was on Aleve which helped with the symptoms but had to stop due to CKD.    - denies symptoms of dactylitis, enthesitis, inflammatory eye disease, psoriasis.    - also reports fatigue, sicca symptoms and occasional canker sores, also has a history of miscarriage/DVT/PE.    - Examination without any concerning rashes or peripheral synovitis, she has tenderness of bilateral first MCPs, bony enlargement of bilateral first MTPs and IP joints of bilateral second toes.  Previous ANA, ENA, RF, CCP, HLA-B27, ANCA panel negative.   - X-rays with cervical spine with minimal disc space narrowing, mild lower lumbar spondylosis, hips WNL, mild degenerative changes of SI joints, knees WNL, hands with OA changes, feet with OA changes along with left first MTP chronic appearing periarticular erosions, subacute to chronic appearing minimally displaced avulsion fracture at the lateral base of second left toe proximal phalanx, ankles WNL    # anxiety, CKD, Crohn's disease, depression, DVT, PE, fibromyalgia, hypertension, hyperlipidemia, OSA, type 2 diabetes, fatty liver     Prior Rheumatologic Medications:  -Adalimumab, azathioprine, infliximab, sulfasalazine, methotrexate, Pentasa, Entyvio   -01/23/2024 R1 MCP steroid injection    Current Rheumatologic Medications:  -March 2025 Stelara     Interval History  - last seen as initial consult on 01/23/2024  - X-rays with cervical spine with minimal disc space narrowing, mild lower lumbar spondylosis, hips WNL, mild degenerative changes of SI joints, knees WNL, hands with OA changes, feet with OA changes along with left first MTP chronic appearing periarticular erosions, subacute to chronic appearing minimally displaced avulsion fracture at the lateral base of second left toe proximal phalanx, ankles WNL  -Injected right first MCP with steroids, joint pain is so much better  - has taken some tylenol  periodically, has been taking flexeril  as well  - crohn's about the same as it has been  - some insurance issues with stelara         Review of Systems   Past Medical History:    Anxiety    Arthritis    Autoimmune disease    Bleeding tendency    Blood clot in vein    Bowel disease    Cataract    Cerebral artery occlusion with cerebral infarction (CMS-HCC)    CKD (chronic kidney disease) stage 3, GFR 30-59 ml/min (CMS-HCC)    Constipation    Crohn disease (CMS-HCC)    Deep vein thrombosis (DVT) (CMS-HCC)    Depression    Difficult intravenous access    Diverticulitis of colon (without mention of hemorrhage)(562.11)    Dizziness    DM (diabetes mellitus), secondary-steroid-induced    DVT (deep venous thrombosis) (CMS-HCC)    Fibromyalgia    GERD (gastroesophageal reflux disease)    H/O Mycobacterium avium intracellulare infection    Hearing loss    Heart murmur    Hiatal  hernia    History of blood transfusion    Hyperlipidemia    Hypertension    Hypertensive heart disease    Inflammatory bowel disease    Irritable bowel disease    Joint pain    Lung disease    Migraines    Nausea    Occlusion of left subclavian vein (CMS-HCC)    Pancreatitis    Pulmonary embolism (CMS-HCC)    Sarcoidosis    SBO (small bowel obstruction) (CMS-HCC)    Seasonal allergic reaction    Seizure disorder (CMS-HCC)    Sleep apnea    Small bowel obstruction (CMS-HCC)    Steatohepatitis    Type 2 diabetes mellitus without complication, with long-term current use of insulin  (CMS-HCC)    Ulcer    Unspecified deficiency anemia    Vaginal tumor     Surgical History:   Procedure Laterality Date    HX ADENOIDECTOMY  05/24/1995    LUNG SURGERY  10/22/2010    lung biopsy    LIVER BIOPSY  12/22/2010    ESOPHAGOGASTRODUODENOSCOPY N/A 03/13/2015    Performed by Tenna Dustman, MD at [facility] ENDO    COLONOSCOPY N/A 03/13/2015    Performed by Tenna Dustman, MD at [facility] ENDO    ESOPHAGOGASTRODUODENOSCOPY BIOPSY  03/13/2015    Performed by Tenna Dustman, MD at [facility] ENDO    COLONOSCOPY BIOPSY  03/13/2015    Performed by Tenna Dustman, MD at [facility] ENDO    ESOPHAGOGASTRODUODENOSCOPY WITH SPECIMEN COLLECTION BY BRUSHING/ WASHING N/A 06/30/2017    Performed by Ledora Catalina, MD at [facility] ENDO    COLONOSCOPY DIAGNOSTIC WITH SPECIMEN COLLECTION BY BRUSHING/ WASHING - FLEXIBLE N/A 06/30/2017    Performed by Ledora Catalina, MD at [facility] ENDO    ESOPHAGOGASTRODUODENOSCOPY WITH BIOPSY - FLEXIBLE N/A 06/30/2017    Performed by Ledora Catalina, MD at [facility] ENDO    COLONOSCOPY WITH BIOPSY - FLEXIBLE  06/30/2017    Performed by Ledora Catalina, MD at [facility] ENDO    COLONOSCOPY DIAGNOSTIC WITH SPECIMEN COLLECTION BY BRUSHING/ WASHING - FLEXIBLE N/A 11/11/2018    Performed by Ronalee Pacific, MD at [facility] ENDO    ESOPHAGOGASTRODUODENOSCOPY WITH SPECIMEN COLLECTION BY BRUSHING/ WASHING N/A 11/11/2018    Performed by Ronalee Pacific, MD at [facility] ENDO    ESOPHAGOGASTRODUODENOSCOPY WITH BIOPSY - FLEXIBLE  11/11/2018    Performed by Ronalee Pacific, MD at [facility] ENDO    COLONOSCOPY DIAGNOSTIC WITH SPECIMEN COLLECTION BY BRUSHING/ WASHING - FLEXIBLE N/A 11/12/2018    Performed by Bonnetta Camellia BROCKS, MD at [facility] ENDO    COLONOSCOPY WITH BIOPSY - FLEXIBLE  11/12/2018    Performed by Bonnetta Camellia BROCKS, MD at [facility] ENDO    ESOPHAGOGASTRODUODENOSCOPY WITH ENDOSCOPIC ULTRASOUND EXAMINATION - FLEXIBLE N/A 03/01/2020    Performed by Verdis Anabel RAMAN, MD at [facility] ENDO    COLONOSCOPY WITH BIOPSY - FLEXIBLE N/A 05/08/2022    Performed by Buckles, Toribio BROCKS, MD at [facility] ENDO    ESOPHAGOGASTRODUODENOSCOPY WITH BIOPSY - FLEXIBLE N/A 05/08/2022    Performed by Buckles, Toribio BROCKS, MD at [facility] ENDO    COLONOSCOPY WITH BIOPSY - FLEXIBLE N/A 06/10/2023    Performed by Buckles, Toribio BROCKS, MD at [facility] ENDO    ESOPHAGOGASTRODUODENOSCOPY WITH BIOPSY - FLEXIBLE N/A 07/03/2023    Performed by Riccardo Sane, MD at [facility] ENDO    APPENDECTOMY      BRONCHOSCOPY      CESAREAN SECTION  1991 and 1994    CHOLECYSTECTOMY  FRACTURE SURGERY      GASTRIC FUNDOPLICATION      HEMORRHOIDECTOMY      HX CATARACT REMOVAL  fall 2019    HX CESAREAN SECTION  1991 & 1994    HX EAR TUBES      HX ENDOSCOPY      colonoscopy 2014    HX EYE SURGERY  2019    HX HYSTERECTOMY  02/1995    partial 02/1995, ovaries removed 1999    OOPHORECTOMY      1999 ovaries removed    RECTAL SURGERY      SMALL BOWEL RESECTION      SUBTOTAL COLECTOMY      Ileocolectomy      Family History   Problem Relation Name Age of Onset    Hypertension Mother Willy     Depression Mother Patti     Basal Cell Carcinoma Mother Willy     Crohn's Disease Mother Willy     Diabetes Mother Willy     Dementia Mother Willy     Hypertension Father Randy     High Cholesterol Father Randy     Depression Father Darina     Liver Disease Father Darina     Kidney Failure Father Darina     Alcohol abuse Father Darina     Celiac Disease Son [place]     Asthma Son [place]     Hypertension Maternal Grandmother Naomi     Stroke Maternal Grandmother Naomi     Heart Failure Maternal Grandfather Ken     Stroke Maternal Grandfather Ken     Migraines Maternal Grandfather Ken     Cancer-Breast Paternal Grandmother Mary     Cancer Paternal Grandmother Mary         breast    Hypertension Paternal Grandmother Mary     High Cholesterol Paternal Grandmother Mary     Depression Paternal Grandmother Mary     Heart Failure Paternal Grandfather Jama Hypertension Paternal Apolinar Jama     Blood Clots Neg Hx      COPD Neg Hx      Coronary Artery Disease Neg Hx      Cystic Fibrosis Neg Hx      DVT Neg Hx      Pulmonary Embolism Neg Hx      Pulmonary Fibrosis Neg Hx      Pulmonary HTN Neg Hx      Melanoma Neg Hx      Irritable Bowel Disease Mother Willy     Arthritis Father Darina      Social History[1]  Vaping/E-liquid Use    Vaping Use Never User      Vaping/E-liquid Substances    CBD No     Nicotine No     Other No     Flavored No     THC No     Unknown No      CBD    Product Type Oil     Route Oral     Current Frequency Weekly     Indication Mood     Max Frequency Weekly            amLODIPine  (NORVASC ) 5 mg tablet Take one tablet by mouth daily.    ascorbic acid (VITAMIN C) 500 mg chewable tablet Chew two tablets by mouth daily.    atorvastatin  (LIPITOR) 40 mg tablet Take one tablet by mouth daily.    budesonide  (ENTOCORT EC ) 3 mg capsule TAKE 2 CAPSULES BY MOUTH EVERY  DAY    buPROPion  XL (WELLBUTRIN  XL) 300 mg tablet TAKE ONE TABLET BY MOUTH EVERY MORNING. DO NOT CRUSH OR CHEW.    cyclobenzaprine  (FLEXERIL ) 10 mg tablet Take one tablet by mouth at bedtime as needed for Muscle Cramps.    DEXCOM G7 SENSOR sensor device Change every 10 days  Indications: type 2 diabetes mellitus, e11.22    EPINEPHrine  (AUVI-Q ) 1 mg/mL injection pen (2-Pack) Inject 0.3 mg (1 Pen) into thigh if needed for anaphylactic reaction. May repeat in 5-15 minutes if needed.    famotidine  (PEPCID ) 40 mg tablet Take one tablet by mouth twice daily.    glucagon  (BAQSIMI ) 3 mg/actuation nasal spray Use 1 spray in single nostril. If no response, may repeat in 15 minutes using second device.    guaiFENesin  LA (MUCINEX ) 600 mg tablet Take one tablet by mouth twice daily as needed.    hydrOXYzine  HCL (ATARAX ) 25 mg tablet Take one tablet by mouth three times daily as needed (allergies).    insulin  aspart (U-100) (NOVOLOG  FLEXPEN U-100 INSULIN ) 100 unit/mL (3 mL) PEN Inject five Units to ten Units under the skin three times daily with meals.    insulin  glargine (BASAGLAR  KWIKPEN U-100 INSULIN ) 100 unit/mL (3 mL) subcutaneous PEN Inject twenty two Units under the skin daily.    multivit-min/iron /FA/vit K/lut (CENTRUM SILVER WOMEN PO) Take 1 tablet by mouth daily.    nebivoloL  (BYSTOLIC ) 10 mg tablet Take one tablet by mouth daily.    ondansetron  (ZOFRAN  ODT) 4 mg rapid dissolve tablet Dissolve one tablet by mouth every 8 hours as needed for Nausea or Vomiting. Place on tongue to dissolve.    pen needle, diabetic (BD NANO 2ND GEN PEN NEEDLE) 32 gauge x 5/32 pen needle Use with insulin  injections four times daily  Indications: e11.22    pramoxine-hydrocortisone  (ANALPRAM -HC) 1-1 % rectal cream Insert or Apply one g to rectal area as directed twice daily. (Patient taking differently: Insert or Apply one g to rectal area as directed twice daily as needed.)    tirzepatide  (MOUNJARO ) 2.5 mg/0.5 mL injector PEN Inject 0.5 mL under the skin every 7 days.    ustekinumab  (STELARA ) 90 mg syringe Inject 1 mL under the skin every 8 weeks.    vonoprazan (VOQUEZNA) 10 mg tablet Take one tablet by mouth daily.       Objective:         Vitals:    03/29/24 1252   BP: 138/87   BP Source: Arm, Right Upper   Pulse: 69   Temp: 36.6 ?C (97.9 ?F)   SpO2: 98%   TempSrc: Oral   PainSc: Zero   Weight: 83 kg (183 lb)   Height: 162.6 cm (5' 4)     Body mass index is 31.41 kg/m?SABRA     Physical Exam    General: Alert and oriented, no acute distress.  Eye: Clear conjunctiva and lids.  Neuro: Grossly non-focal  Skin: no suspicious lesions visualized.  MSK: no synovitis, warmth or erythema in any of the joints of the upper or lower extremities bilaterally.  ROM was normal except as below  R1 MCP was tender previously, not tender today  Bony enlargement bilateral first MTPs and IP joints of bilateral second toes.       Labs:  Labs reviewed    Imaging:         Assessment and Plan:  Tina Snyder is a 58 y.o. female with past medical history of anxiety, CKD, Crohn's disease, depression,  DVT, PE, fibromyalgia, hypertension, hyperlipidemia, OSA, type 2 diabetes, fatty liver who presents today for follow up of polyarthralgia, mechanical and/or fibromyalgia, crohn's disease    She was last seen as initial consult on 01/23/2024. X-rays with cervical spine with minimal disc space narrowing, mild lower lumbar spondylosis, hips WNL, mild degenerative changes of SI joints, knees WNL, hands with OA changes, feet with OA changes along with left first MTP chronic appearing periarticular erosions, subacute to chronic appearing minimally displaced avulsion fracture at the lateral base of second left toe proximal phalanx, ankles WNL. Injected right first MCP with steroids which has responded well, wishes to get L1 MCP injected today.  Also reports feet pain particularly at the base of both big toes, lower back pain with mechanical features.  Examination without any concerning rashes or peripheral synovitis.    As mentioned previously,  low suspicion that this is IBD arthropathy.  Likely mechanical issues from multifocal osteoarthritis.  Left first MTP erosions noted, could have been from previously active inflammatory arthritis or??  Gout (although does not report typical symptoms of gout).  For her feet pain and lower back pain, I have recommended conservative measures and referral to podiatry and pain clinic respectively.  I have recommended following with gastroenterology.      Recommendations:  Injected L1 MCP with 20 mg methylprednisolone  and 0.2 cc lidocaine   Postinjection instructions provided  Trial topical diclofenac  gel 3-4 times a day as needed, Tylenol  arthritis up to a max of 3 g/day as needed  Referral to pain clinic and podiatry  Follow-up with gastroenterology         Return Visit: 6 months    Orders Placed This Encounter    ARTHROCENTESIS    AMB REFERRAL TO PODIATRY    AMB REFERRAL TO PAIN CLINIC    Injection Small Joint/Bursa W/O US  [20600] lidocaine  PF 1% (10 mg/mL) injection 0.2 mL    methylPREDNISolone  acetate (DEPO-Medrol ) injection 20 mg     Total of 40 minutes were spent on the same day of the visit including preparing to see the patient, obtaining and/or reviewing separately obtained history, performing a medically appropriate examination and/or evaluation, counseling and educating the patient/family/caregiver, ordering medications, tests, or procedures, referring and communication with other health care professionals, documenting clinical information in the electronic or other health record, independently interpreting results and communicating results to the patient/family/caregiver, and care coordination.        Tito Dennise COME  Assistant Professor of Medicine  Rheumatology     Because this dictation was prepared with voice recognition software, there remains a potential for typographical errors or incorrect word choices by the system. We apologize for any inadvertent inconvenience from such an error.     CC:  Dr. 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     Employer: [employer]   Tobacco Use    Smoking status: Never     Passive exposure: Past    Smokeless tobacco: Never   Vaping Use    Vaping status: Never Used   Substance and Sexual Activity    Alcohol use: Not Currently     Comment: Drink maybe twice a year    Drug  use: Never    Sexual activity: Yes     Partners: Male     Birth control/protection: None   Social History Narrative    Married x 28 years; 2 children 51, 40 yo; 2012 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

## 2024-03-29 ENCOUNTER — Encounter: Admit: 2024-03-29 | Discharge: 2024-03-29 | Payer: BLUE CROSS/BLUE SHIELD

## 2024-03-29 ENCOUNTER — Ambulatory Visit: Admit: 2024-03-29 | Discharge: 2024-03-30 | Payer: BLUE CROSS/BLUE SHIELD

## 2024-03-29 DIAGNOSIS — M19042 Primary osteoarthritis, left hand: Secondary | ICD-10-CM

## 2024-03-29 DIAGNOSIS — K50918 Crohn's disease, unspecified, with other complication: Secondary | ICD-10-CM

## 2024-03-29 DIAGNOSIS — M19071 Primary osteoarthritis, right ankle and foot: Secondary | ICD-10-CM

## 2024-03-29 DIAGNOSIS — M1990 Unspecified osteoarthritis, unspecified site: Secondary | ICD-10-CM

## 2024-03-29 DIAGNOSIS — M545 Low back pain, unspecified back pain laterality, unspecified chronicity, unspecified whether sciatica present: Secondary | ICD-10-CM

## 2024-03-29 DIAGNOSIS — M797 Fibromyalgia: Secondary | ICD-10-CM

## 2024-03-29 MED ORDER — LIDOCAINE (PF) 10 MG/ML (1 %) IJ SOLN
.2 mL | Freq: Once | INTRA_ARTICULAR | 0 refills | Status: CP
Start: 2024-03-29 — End: ?
  Administered 2024-03-29: 19:00:00 0.2 mL via INTRA_ARTICULAR

## 2024-03-29 MED ORDER — METHYLPREDNISOLONE ACETATE 80 MG/ML IJ SUSP
20 mg | Freq: Once | INTRA_ARTICULAR | 0 refills | Status: CP
Start: 2024-03-29 — End: ?
  Administered 2024-03-29: 19:00:00 20 mg via INTRA_ARTICULAR

## 2024-03-29 NOTE — Procedures [3]
 Intraarticular Injection(s)    Consent was obtained  Area was prepped in sterile manner.  Location clarified for correct patient, correct location, and correct medication.    Location:  L 1 MCP   Injection: Kenalog  20mg  and lidocaine  0.2cc    Pain Scale:  Before injection:6/10     Written post-injection instructions were given.    No complications before, during, or after the procedure.

## 2024-03-30 DIAGNOSIS — M79671 Pain in right foot: Secondary | ICD-10-CM

## 2024-03-30 DIAGNOSIS — M19072 Primary osteoarthritis, left ankle and foot: Secondary | ICD-10-CM

## 2024-03-30 DIAGNOSIS — M79672 Pain in left foot: Secondary | ICD-10-CM

## 2024-04-04 ENCOUNTER — Encounter: Admit: 2024-04-04 | Discharge: 2024-04-04 | Payer: BLUE CROSS/BLUE SHIELD

## 2024-04-05 ENCOUNTER — Encounter: Admit: 2024-04-05 | Discharge: 2024-04-05 | Payer: BLUE CROSS/BLUE SHIELD

## 2024-04-05 DIAGNOSIS — K50018 Crohn's disease of small intestine with other complication: Principal | ICD-10-CM

## 2024-04-05 MED ORDER — BUDESONIDE 3 MG PO CECX
6 mg | ORAL_CAPSULE | Freq: Every day | ORAL | 5 refills | 30.00000 days | Status: AC
Start: 2024-04-05 — End: ?

## 2024-04-06 ENCOUNTER — Encounter: Admit: 2024-04-06 | Discharge: 2024-04-06 | Payer: BLUE CROSS/BLUE SHIELD

## 2024-04-06 DIAGNOSIS — N1832 Stage 3b chronic kidney disease (CMS-HCC): Principal | ICD-10-CM

## 2024-04-06 NOTE — Telephone Encounter [36]
 An encounter has been created for documentation only (often for preparation of an upcoming appointment or for follow up on orders/imaging) and patient does not need contact RN and did not miss a phone call or appointment.     RN prepped patient information for clinic with Randall Benders APRN NP on 05/25/24 at 1400 via in person appointment.     Here for 12 mo follow up  Dx:OSA  Dx:OSA, Bronchial atresia, chronic resp failure  Pt known to sleep department by Dr. Girtha     PMH of asthma, OSA and obesity      Last SS: 10/10/17 with [facility] 133  Noc ox 09/24/22-  oxygen discontinued.    Previous cpap 8-15     LV: 01/15/23 with Randall Benders   Meds:n/a    Last ct chest wo contrast 06/30/23    IFZ:Jempj  RN obtained download from AV

## 2024-04-07 ENCOUNTER — Encounter: Admit: 2024-04-07 | Discharge: 2024-04-07 | Payer: BLUE CROSS/BLUE SHIELD

## 2024-04-07 ENCOUNTER — Ambulatory Visit: Admit: 2024-04-07 | Discharge: 2024-04-08 | Payer: BLUE CROSS/BLUE SHIELD

## 2024-04-07 NOTE — Telephone Encounter [36]
 Called Patient to let her know Meghan is Ill and cancelling her clinic  Some will call to reschedule, Patient VU

## 2024-04-09 ENCOUNTER — Encounter: Admit: 2024-04-09 | Discharge: 2024-04-09 | Payer: BLUE CROSS/BLUE SHIELD

## 2024-04-14 ENCOUNTER — Encounter: Admit: 2024-04-14 | Discharge: 2024-04-14 | Payer: BLUE CROSS/BLUE SHIELD

## 2024-04-15 ENCOUNTER — Encounter: Admit: 2024-04-15 | Discharge: 2024-04-15 | Payer: BLUE CROSS/BLUE SHIELD

## 2024-04-16 ENCOUNTER — Encounter: Admit: 2024-04-16 | Discharge: 2024-04-16 | Payer: BLUE CROSS/BLUE SHIELD

## 2024-04-19 ENCOUNTER — Encounter: Admit: 2024-04-19 | Discharge: 2024-04-19 | Payer: BLUE CROSS/BLUE SHIELD

## 2024-04-19 DIAGNOSIS — N184 Chronic kidney disease, stage 4 (severe): Principal | ICD-10-CM

## 2024-04-20 ENCOUNTER — Encounter: Admit: 2024-04-20 | Discharge: 2024-04-20 | Payer: BLUE CROSS/BLUE SHIELD

## 2024-04-20 ENCOUNTER — Ambulatory Visit: Admit: 2024-04-20 | Discharge: 2024-04-21 | Payer: BLUE CROSS/BLUE SHIELD

## 2024-04-20 NOTE — Telephone Encounter [36]
 Called patient on 04/19/2024 regarding her kidney function being stable/slightly worse. She has persistent pyuria. Remains on Voquezna which can cause AIN/chronic interstitial nephritis. We have spoke to GI regarding switching this medication but she has severe GERD symptoms that is uncontrolled on any other medication. Since it is not possible to do trial of Voquezna avoidance, there is not way to find out about the etiology of her kidney disease. Also, her kidney function worsened after initial improvement to 1.2-1.3 mg/dl and now is ~8.2-8.0 mg/dl. I spoke to her over the phone. Discussed about kidney biopsy. Discussed about potential benefit, risk, complications. Discussed about possibility of not getting an answer even after biopsy. She understands and agree to proceed with the biopsy.   CBC and INR ordered yesterday which showed normal Hb and INR. Order placed for outpatient renal biopsy.   Plan discussed with Dr. Arlice.

## 2024-04-21 ENCOUNTER — Encounter: Admit: 2024-04-21 | Discharge: 2024-04-21 | Payer: BLUE CROSS/BLUE SHIELD

## 2024-04-25 ENCOUNTER — Encounter: Admit: 2024-04-25 | Discharge: 2024-04-25 | Payer: BLUE CROSS/BLUE SHIELD

## 2024-04-27 ENCOUNTER — Encounter: Admit: 2024-04-27 | Discharge: 2024-04-27 | Payer: BLUE CROSS/BLUE SHIELD

## 2024-04-27 MED FILL — USTEKINUMAB 90 MG/ML SC SYRG: 90 mg | SUBCUTANEOUS | 56 days supply | Qty: 1 | Fill #1 | Status: AC

## 2024-04-28 ENCOUNTER — Encounter: Admit: 2024-04-28 | Discharge: 2024-04-28 | Payer: BLUE CROSS/BLUE SHIELD

## 2024-04-29 ENCOUNTER — Encounter: Admit: 2024-04-29 | Discharge: 2024-05-01 | Payer: BLUE CROSS/BLUE SHIELD

## 2024-04-29 ENCOUNTER — Encounter: Admit: 2024-04-29 | Discharge: 2024-04-29 | Payer: BLUE CROSS/BLUE SHIELD

## 2024-04-30 ENCOUNTER — Encounter: Admit: 2024-04-30 | Discharge: 2024-04-30 | Payer: BLUE CROSS/BLUE SHIELD

## 2024-05-03 ENCOUNTER — Encounter: Admit: 2024-05-03 | Discharge: 2024-05-03 | Payer: BLUE CROSS/BLUE SHIELD

## 2024-05-14 ENCOUNTER — Encounter: Admit: 2024-05-14 | Discharge: 2024-05-14 | Payer: BLUE CROSS/BLUE SHIELD

## 2024-05-17 ENCOUNTER — Encounter: Admit: 2024-05-17 | Discharge: 2024-05-17 | Payer: BLUE CROSS/BLUE SHIELD

## 2024-05-17 ENCOUNTER — Ambulatory Visit: Admit: 2024-05-17 | Discharge: 2024-05-18 | Payer: BLUE CROSS/BLUE SHIELD

## 2024-05-17 MED FILL — DEXCOM G7 SENSOR MISC DEVI: 30 days supply | Qty: 3 | Fill #2 | Status: AC

## 2024-05-18 ENCOUNTER — Encounter: Admit: 2024-05-18 | Discharge: 2024-05-18 | Payer: BLUE CROSS/BLUE SHIELD

## 2024-05-21 ENCOUNTER — Encounter: Admit: 2024-05-21 | Discharge: 2024-05-21 | Payer: BLUE CROSS/BLUE SHIELD

## 2024-05-21 MED ORDER — EASYPOINT NEEDLE 25 GAUGE X 1" MISC NDLE
3 refills | Status: AC
Start: 2024-05-21 — End: ?

## 2024-05-24 ENCOUNTER — Encounter: Admit: 2024-05-24 | Discharge: 2024-05-24 | Payer: BLUE CROSS/BLUE SHIELD

## 2024-05-25 ENCOUNTER — Encounter: Admit: 2024-05-25 | Discharge: 2024-05-25 | Payer: BLUE CROSS/BLUE SHIELD

## 2024-05-27 ENCOUNTER — Encounter: Admit: 2024-05-27 | Discharge: 2024-05-27 | Payer: BLUE CROSS/BLUE SHIELD

## 2024-05-28 ENCOUNTER — Encounter: Admit: 2024-05-28 | Discharge: 2024-05-28 | Payer: BLUE CROSS/BLUE SHIELD
